# Patient Record
Sex: Female | Born: 1950 | Race: White | Hispanic: No | Smoking: Former smoker
Health system: Southern US, Community
[De-identification: ages and names within clinical notes are randomized; demographics above are authoritative.]

## PROBLEM LIST (undated history)

## (undated) DIAGNOSIS — H269 Unspecified cataract: Secondary | ICD-10-CM

## (undated) DIAGNOSIS — J449 Chronic obstructive pulmonary disease, unspecified: Secondary | ICD-10-CM

## (undated) DIAGNOSIS — E785 Hyperlipidemia, unspecified: Secondary | ICD-10-CM

## (undated) DIAGNOSIS — J45909 Unspecified asthma, uncomplicated: Secondary | ICD-10-CM

## (undated) DIAGNOSIS — Z5189 Encounter for other specified aftercare: Secondary | ICD-10-CM

## (undated) DIAGNOSIS — K219 Gastro-esophageal reflux disease without esophagitis: Secondary | ICD-10-CM

## (undated) DIAGNOSIS — M1990 Unspecified osteoarthritis, unspecified site: Secondary | ICD-10-CM

## (undated) DIAGNOSIS — D649 Anemia, unspecified: Secondary | ICD-10-CM

## (undated) DIAGNOSIS — F419 Anxiety disorder, unspecified: Secondary | ICD-10-CM

## (undated) DIAGNOSIS — I10 Essential (primary) hypertension: Secondary | ICD-10-CM

## (undated) DIAGNOSIS — F32A Depression, unspecified: Secondary | ICD-10-CM

## (undated) HISTORY — DX: Anxiety disorder, unspecified: F41.9

## (undated) HISTORY — DX: Hyperlipidemia, unspecified: E78.5

## (undated) HISTORY — PX: OTHER SURGICAL HISTORY: SHX169

## (undated) HISTORY — PX: KNEE SURGERY: SHX244

## (undated) HISTORY — PX: APPENDECTOMY: SHX54

## (undated) HISTORY — DX: Gastro-esophageal reflux disease without esophagitis: K21.9

## (undated) HISTORY — DX: Unspecified cataract: H26.9

## (undated) HISTORY — DX: Encounter for other specified aftercare: Z51.89

## (undated) HISTORY — DX: Anemia, unspecified: D64.9

## (undated) HISTORY — DX: Chronic obstructive pulmonary disease, unspecified: J44.9

## (undated) HISTORY — DX: Unspecified asthma, uncomplicated: J45.909

## (undated) HISTORY — PX: NASAL SINUS SURGERY: SHX719

## (undated) HISTORY — DX: Unspecified osteoarthritis, unspecified site: M19.90

## (undated) HISTORY — DX: Essential (primary) hypertension: I10

## (undated) HISTORY — DX: Depression, unspecified: F32.A

---

## 2001-10-17 ENCOUNTER — Encounter (INDEPENDENT_AMBULATORY_CARE_PROVIDER_SITE_OTHER): Payer: Self-pay | Admitting: Family Medicine

## 2003-12-12 ENCOUNTER — Emergency Department (HOSPITAL_COMMUNITY): Admission: EM | Admit: 2003-12-12 | Discharge: 2003-12-12 | Payer: Self-pay | Admitting: Family Medicine

## 2004-05-05 ENCOUNTER — Emergency Department (HOSPITAL_COMMUNITY): Admission: EM | Admit: 2004-05-05 | Discharge: 2004-05-05 | Payer: Self-pay | Admitting: Family Medicine

## 2004-05-19 ENCOUNTER — Ambulatory Visit: Payer: Self-pay | Admitting: Internal Medicine

## 2004-05-29 ENCOUNTER — Ambulatory Visit: Payer: Self-pay | Admitting: Family Medicine

## 2004-06-02 ENCOUNTER — Ambulatory Visit: Payer: Self-pay | Admitting: *Deleted

## 2004-06-06 ENCOUNTER — Ambulatory Visit: Payer: Self-pay | Admitting: Family Medicine

## 2004-11-14 ENCOUNTER — Ambulatory Visit: Payer: Self-pay | Admitting: Family Medicine

## 2005-05-07 ENCOUNTER — Ambulatory Visit: Payer: Self-pay | Admitting: Family Medicine

## 2005-05-15 ENCOUNTER — Ambulatory Visit: Payer: Self-pay | Admitting: Family Medicine

## 2005-10-06 ENCOUNTER — Ambulatory Visit: Payer: Self-pay | Admitting: Internal Medicine

## 2005-11-13 ENCOUNTER — Ambulatory Visit: Payer: Self-pay | Admitting: Family Medicine

## 2005-11-19 ENCOUNTER — Ambulatory Visit: Payer: Self-pay | Admitting: Family Medicine

## 2005-12-01 ENCOUNTER — Ambulatory Visit: Payer: Self-pay | Admitting: Family Medicine

## 2005-12-31 ENCOUNTER — Ambulatory Visit: Payer: Self-pay | Admitting: Family Medicine

## 2006-04-06 ENCOUNTER — Ambulatory Visit (HOSPITAL_COMMUNITY): Admission: RE | Admit: 2006-04-06 | Discharge: 2006-04-06 | Payer: Self-pay | Admitting: Family Medicine

## 2006-06-23 ENCOUNTER — Emergency Department (HOSPITAL_COMMUNITY): Admission: EM | Admit: 2006-06-23 | Discharge: 2006-06-23 | Payer: Self-pay | Admitting: Family Medicine

## 2006-07-24 ENCOUNTER — Emergency Department (HOSPITAL_COMMUNITY): Admission: EM | Admit: 2006-07-24 | Discharge: 2006-07-24 | Payer: Self-pay | Admitting: Emergency Medicine

## 2006-07-27 ENCOUNTER — Ambulatory Visit: Payer: Self-pay | Admitting: Family Medicine

## 2006-10-25 ENCOUNTER — Encounter (INDEPENDENT_AMBULATORY_CARE_PROVIDER_SITE_OTHER): Payer: Self-pay | Admitting: Family Medicine

## 2006-10-25 DIAGNOSIS — J45909 Unspecified asthma, uncomplicated: Secondary | ICD-10-CM | POA: Insufficient documentation

## 2006-10-25 DIAGNOSIS — Z9849 Cataract extraction status, unspecified eye: Secondary | ICD-10-CM

## 2006-10-25 DIAGNOSIS — L503 Dermatographic urticaria: Secondary | ICD-10-CM

## 2006-10-25 DIAGNOSIS — Z9889 Other specified postprocedural states: Secondary | ICD-10-CM | POA: Insufficient documentation

## 2006-10-25 DIAGNOSIS — I10 Essential (primary) hypertension: Secondary | ICD-10-CM | POA: Insufficient documentation

## 2006-10-25 DIAGNOSIS — J309 Allergic rhinitis, unspecified: Secondary | ICD-10-CM | POA: Insufficient documentation

## 2006-10-25 DIAGNOSIS — H269 Unspecified cataract: Secondary | ICD-10-CM

## 2006-12-22 ENCOUNTER — Emergency Department (HOSPITAL_COMMUNITY): Admission: EM | Admit: 2006-12-22 | Discharge: 2006-12-22 | Payer: Self-pay | Admitting: Family Medicine

## 2006-12-22 ENCOUNTER — Encounter (INDEPENDENT_AMBULATORY_CARE_PROVIDER_SITE_OTHER): Payer: Self-pay | Admitting: Family Medicine

## 2006-12-23 ENCOUNTER — Ambulatory Visit: Payer: Self-pay | Admitting: Internal Medicine

## 2007-04-05 ENCOUNTER — Telehealth (INDEPENDENT_AMBULATORY_CARE_PROVIDER_SITE_OTHER): Payer: Self-pay | Admitting: Family Medicine

## 2007-10-17 ENCOUNTER — Telehealth (INDEPENDENT_AMBULATORY_CARE_PROVIDER_SITE_OTHER): Payer: Self-pay | Admitting: Family Medicine

## 2007-11-25 ENCOUNTER — Emergency Department (HOSPITAL_COMMUNITY): Admission: EM | Admit: 2007-11-25 | Discharge: 2007-11-25 | Payer: Self-pay | Admitting: Family Medicine

## 2008-03-29 ENCOUNTER — Emergency Department (HOSPITAL_COMMUNITY): Admission: EM | Admit: 2008-03-29 | Discharge: 2008-03-29 | Payer: Self-pay | Admitting: Emergency Medicine

## 2008-04-16 ENCOUNTER — Emergency Department (HOSPITAL_COMMUNITY): Admission: EM | Admit: 2008-04-16 | Discharge: 2008-04-16 | Payer: Self-pay | Admitting: Emergency Medicine

## 2008-04-25 ENCOUNTER — Telehealth (INDEPENDENT_AMBULATORY_CARE_PROVIDER_SITE_OTHER): Payer: Self-pay | Admitting: Family Medicine

## 2008-05-04 ENCOUNTER — Encounter (INDEPENDENT_AMBULATORY_CARE_PROVIDER_SITE_OTHER): Payer: Self-pay | Admitting: Family Medicine

## 2008-10-19 ENCOUNTER — Emergency Department (HOSPITAL_COMMUNITY): Admission: EM | Admit: 2008-10-19 | Discharge: 2008-10-19 | Payer: Self-pay | Admitting: Family Medicine

## 2008-11-30 ENCOUNTER — Ambulatory Visit: Payer: Self-pay | Admitting: Internal Medicine

## 2008-11-30 DIAGNOSIS — R635 Abnormal weight gain: Secondary | ICD-10-CM

## 2008-11-30 DIAGNOSIS — J449 Chronic obstructive pulmonary disease, unspecified: Secondary | ICD-10-CM

## 2008-11-30 DIAGNOSIS — J4489 Other specified chronic obstructive pulmonary disease: Secondary | ICD-10-CM | POA: Insufficient documentation

## 2008-11-30 LAB — CONVERTED CEMR LAB
ALT: 14 units/L (ref 0–35)
AST: 13 units/L (ref 0–37)
Albumin: 4.4 g/dL (ref 3.5–5.2)
Basophils Absolute: 0 10*3/uL (ref 0.0–0.1)
Bilirubin Urine: NEGATIVE
Blood in Urine, dipstick: NEGATIVE
Chloride: 104 meq/L (ref 96–112)
Glucose, Bld: 82 mg/dL (ref 70–99)
Lymphocytes Relative: 27 % (ref 12–46)
Lymphs Abs: 2.2 10*3/uL (ref 0.7–4.0)
MCHC: 33.3 g/dL (ref 30.0–36.0)
Neutro Abs: 4.4 10*3/uL (ref 1.7–7.7)
Nitrite: NEGATIVE
Platelets: 295 10*3/uL (ref 150–400)
Potassium: 3.3 meq/L — ABNORMAL LOW (ref 3.5–5.3)
Protein, U semiquant: NEGATIVE
RDW: 13.6 % (ref 11.5–15.5)
Specific Gravity, Urine: 1.01
Total Bilirubin: 0.5 mg/dL (ref 0.3–1.2)
Total Protein: 7.2 g/dL (ref 6.0–8.3)
pH: 7

## 2008-12-10 ENCOUNTER — Encounter (INDEPENDENT_AMBULATORY_CARE_PROVIDER_SITE_OTHER): Payer: Self-pay | Admitting: Internal Medicine

## 2009-03-08 ENCOUNTER — Ambulatory Visit: Payer: Self-pay | Admitting: Internal Medicine

## 2009-03-08 DIAGNOSIS — M25519 Pain in unspecified shoulder: Secondary | ICD-10-CM | POA: Insufficient documentation

## 2009-05-24 ENCOUNTER — Emergency Department (HOSPITAL_COMMUNITY): Admission: EM | Admit: 2009-05-24 | Discharge: 2009-05-24 | Payer: Self-pay | Admitting: Emergency Medicine

## 2009-10-09 ENCOUNTER — Emergency Department (HOSPITAL_COMMUNITY): Admission: EM | Admit: 2009-10-09 | Discharge: 2009-10-09 | Payer: Self-pay | Admitting: Family Medicine

## 2009-10-09 ENCOUNTER — Emergency Department (HOSPITAL_COMMUNITY): Admission: EM | Admit: 2009-10-09 | Discharge: 2009-10-09 | Payer: Self-pay | Admitting: Emergency Medicine

## 2009-10-22 ENCOUNTER — Emergency Department (HOSPITAL_COMMUNITY): Admission: EM | Admit: 2009-10-22 | Discharge: 2009-10-23 | Payer: Self-pay | Admitting: Emergency Medicine

## 2009-10-23 ENCOUNTER — Ambulatory Visit: Payer: Self-pay | Admitting: Internal Medicine

## 2009-10-24 ENCOUNTER — Telehealth (INDEPENDENT_AMBULATORY_CARE_PROVIDER_SITE_OTHER): Payer: Self-pay | Admitting: *Deleted

## 2009-10-25 ENCOUNTER — Ambulatory Visit: Payer: Self-pay | Admitting: Internal Medicine

## 2009-10-25 ENCOUNTER — Encounter (INDEPENDENT_AMBULATORY_CARE_PROVIDER_SITE_OTHER): Payer: Self-pay | Admitting: Internal Medicine

## 2009-10-25 ENCOUNTER — Encounter: Payer: Self-pay | Admitting: Internal Medicine

## 2009-10-25 DIAGNOSIS — F329 Major depressive disorder, single episode, unspecified: Secondary | ICD-10-CM

## 2009-10-25 DIAGNOSIS — R05 Cough: Secondary | ICD-10-CM

## 2009-10-25 DIAGNOSIS — F410 Panic disorder [episodic paroxysmal anxiety] without agoraphobia: Secondary | ICD-10-CM

## 2009-10-25 LAB — CONVERTED CEMR LAB
Lymphocytes Relative: 36 % (ref 12–46)
MCHC: 31.6 g/dL (ref 30.0–36.0)
MCV: 93.6 fL (ref 78.0–100.0)
Monocytes Relative: 15 % — ABNORMAL HIGH (ref 3–12)
Neutro Abs: 2.3 10*3/uL (ref 1.7–7.7)
Neutrophils Relative %: 40 % — ABNORMAL LOW (ref 43–77)
RBC: 4.23 M/uL (ref 3.87–5.11)
TSH: 1.229 microintl units/mL (ref 0.350–4.500)
WBC: 5.6 10*3/uL (ref 4.0–10.5)

## 2009-11-11 ENCOUNTER — Telehealth (INDEPENDENT_AMBULATORY_CARE_PROVIDER_SITE_OTHER): Payer: Self-pay | Admitting: Internal Medicine

## 2009-11-14 ENCOUNTER — Ambulatory Visit: Payer: Self-pay | Admitting: Internal Medicine

## 2009-11-29 ENCOUNTER — Ambulatory Visit: Payer: Self-pay | Admitting: Internal Medicine

## 2009-12-26 ENCOUNTER — Telehealth (INDEPENDENT_AMBULATORY_CARE_PROVIDER_SITE_OTHER): Payer: Self-pay | Admitting: Nurse Practitioner

## 2010-01-27 ENCOUNTER — Ambulatory Visit: Payer: Self-pay | Admitting: Internal Medicine

## 2010-01-30 ENCOUNTER — Ambulatory Visit: Payer: Self-pay | Admitting: Internal Medicine

## 2010-03-06 ENCOUNTER — Emergency Department (HOSPITAL_COMMUNITY)
Admission: EM | Admit: 2010-03-06 | Discharge: 2010-03-06 | Payer: Self-pay | Source: Home / Self Care | Admitting: Family Medicine

## 2010-03-09 ENCOUNTER — Encounter: Payer: Self-pay | Admitting: Family Medicine

## 2010-03-09 ENCOUNTER — Encounter: Payer: Self-pay | Admitting: Internal Medicine

## 2010-03-18 NOTE — Progress Notes (Signed)
Summary: MEDS REFILL Zoloft  Phone Note Refill Request Call back at [phone]   Refills Requested: Medication #1:  ZOLOFT 50 MG TABS One tablet by mouth daily for mood [facility] ...  Initial call taken by: Domenic Polite,  December 26, 2009 4:27 PM  Follow-up for Phone Call        Refills transferred to [facility].  Dutch Quint RN  December 26, 2009 5:04 PM      Appended Document: MEDS REFILL Zoloft Refill called into [place] -- no record of it at [facility].  Dutch Quint RN  December 30, 2009 2:36 PM

## 2010-03-18 NOTE — Assessment & Plan Note (Signed)
Summary: OV:  breathing problems--triage from nurse  Nurse Visit   Vital Signs:  Patient profile:   60 year old female Menstrual status:  postmenopausal O2 Sat:      97 % on Room air Temp:     97.5 degrees F oral Pulse rate:   96 / minute Pulse rhythm:   regular Resp:     20 per minute BP sitting:   128 / 72  (right arm) Cuff size:   regular  Vitals Entered By: Dutch Quint RN (October 25, 2009 9:50 AM)  O2 Flow:  Room air  Patient Instructions: 1)  Follow up with Dr. Delrae Alfred in 1 month --depression 2)  Referral to [facility]   Physical Exam  General:  alert, well-developed, well-nourished, and well-hydrated.  Episode of cough while in room--dry and hacky paroxyms to point of gagging. Eyes:  No corneal or conjunctival inflammation noted. EOMI. Perrla. Funduscopic exam benign, without hemorrhages, exudates or papilledema. Vision grossly normal. Ears:  External ear exam shows no significant lesions or deformities.  Otoscopic examination reveals clear canals, tympanic membranes are intact bilaterally without bulging, retraction, inflammation or discharge. Hearing is grossly normal bilaterally. Nose:  External nasal examination shows no deformity or inflammation. Nasal mucosa are pink and moist without lesions or exudates. Mouth:  pharynx pink and moist.   Lungs:  normal respiratory effort, no intercostal retractions, no accessory muscle use, no crackles, and no wheezes.   Heart:  normal rate and regular rhythm.   Psych:  anxious appearing   Impression & Recommendations:  Problem # 1:  COPD (ICD-496) Asthma/cough To continue meds--finish prednisone -- not sure how much of this is due to anxiety as well. Keep side effect of Benazapril in mind as well. Her updated medication list for this problem includes:    Advair Diskus 100-50 Mcg/dose Misc (Fluticasone-salmeterol) ..... One puff two times a day    Singulair 10 Mg Tabs (Montelukast sodium) ..... One every afternoon   Proventil Hfa 108 (90 Base) Mcg/act Aers (Albuterol sulfate) .Marland Kitchen... 1-2 puffs q 4-6 hours as needed    Spiriva Handihaler 18 Mcg Caps (Tiotropium bromide monohydrate) .Marland Kitchen... 1 inhalation daily  Problem # 2:  COLD INTOLERANCE (ICD-780.99) No definite findings or history of Raynaud's Orders: T-CBC w/Diff (04540-98119) T-TSH (14782-95621)  Problem # 3:  DEPRESSION (ICD-311) Start Zoloft Referral for counseling with [facility] Solian Her updated medication list for this problem includes:    Zoloft 50 Mg Tabs (Sertraline hcl) .Marland Kitchen... 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 today. Her updated medication list for this problem includes:    Zoloft 50 Mg Tabs (Sertraline hcl) .Marland Kitchen... 1/2 tab by mouth daily for 7 days, then increase to 1 tab daily     Orders: Psychology Referral (Psychology)  Problem # 5:  SHOULDER PAIN, RIGHT (ICD-719.41) Exercises discussed following warm packing. Her updated medication list for this problem includes:    Cyclobenzaprine Hcl 10 Mg Tabs (Cyclobenzaprine hcl) .Marland Kitchen... 1/2 to 1 tab by mouth every evening as needed shoulder pain  Complete Medication List: 1)  Nasacort Aq 55 Mcg/act Aers (Triamcinolone acetonide(nasal)) .... 2 squirts eah nostril once daily 2)  Advair Diskus 100-50 Mcg/dose Misc (Fluticasone-salmeterol) .... One puff two times a day 3)  Singulair 10 Mg Tabs (Montelukast sodium) .... One every afternoon 4)  Proventil Hfa  108 (90 Base) Mcg/act Aers (Albuterol sulfate) .Marland Kitchen.. 1-2 puffs q 4-6 hours as needed 5)  Hydrochlorothiazide 12.5 Mg Caps (Hydrochlorothiazide) .... Take one capsule each morning 6)  Benazepril Hcl 10 Mg Tabs (Benazepril hcl) .... Take one tablet by mouth qam 7)  Loratadine 10 Mg Tabs (Loratadine) .Marland Kitchen.. 1 tab by mouth daily 8)  Aerochamber Mv Misc (Spacer/aero-holding chambers) 9)  Spiriva Handihaler 18 Mcg Caps  (Tiotropium bromide monohydrate) .Marland Kitchen.. 1 inhalation daily 10)  Cyclobenzaprine Hcl 10 Mg Tabs (Cyclobenzaprine hcl) .... 1/2 to 1 tab by mouth every evening as needed shoulder pain 11)  Zoloft 50 Mg Tabs (Sertraline hcl) .... 1/2 tab by mouth daily for 7 days, then increase to 1 tab daily 12)  Lorazepam 1 Mg Tabs (Lorazepam) .... 1/2  to 1 tab o as needed anxiety   History of Present Illness: States breathing problem isn't in her lower lungs, she feels like she's got something lodged in her upper airway and she can't cough it out.  Sometimes, she coughs so hard that she almost vomits, will cough up thick, clear-yellow mucus.  Feels like she "has inflammation".  Takes all her medications as ordered, using her inhaler frequently.  Provider HPI:    1.  For past 2 weeks, has felt blocked in low throat to upper chest, feels blocked fairly consistently.  Coughs in paroxysms until brings up a plug of hard mucous--clear to white.  Does not really have chest tightness--feels like it is in her upper airway about tracheal level.  Cough gets better when brings up plug.  Was seen in ED 10/23/09--late evening as could just not open up airway, even with rescue inhaler.  Unable to find measurements, but reportedly peak flow improved with just one neb treatment and pt. felt better. Chest Xray showed peribronchial thickening constistent with bronchitis.  Was given Prednisone burst for 5 days, which she continues.  Pt. continuing to have cough and sense of block in upper airway, though is a bit better.   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  Takes Loratadine daily for this.  Not sure if it makes a difference.  No rhyme or reason to the rash.    Worse time of year for breathing is in winter.  Generally, does not have trouble in the fall with ragweed, etc.    2.  3 days ago:  sudden onset of cold.  Was so cold that fingers went numb.  Went out to car and turned heat all the way up--made no difference.  Went home from work and  covered up and eventually improved.  Did not get light headed with this.  Lasted 3 hours.  Has a history of hands getting very cold when exposed to the cold--has to hold hands under warm water  3.  Depression/anxiety:  pt. went to screening at [facility] recently and was told she has depression.  Has felt depressed and has had panic attacks all of adult life.  Always thought it would be brushed off, so has never brought up to a caregiver.  Depression(cont)  Does not look forward to day.  No motivation.  No pleasure in anything.  Does not want to go out anywhere.  Tearful often.  No suicidal ideation.  Very anxious and perserverates on physical concerns or any concern.  Had panic attack in past week that lasted 3 hours.  4.  Muscle spasm--cervical--would like a bit more of muscle relaxant to use as needed.  Did not go to PT as better with  exercises I gave her.     Review of Systems General:  Complains of chills, fatigue, and malaise; denies fever, loss of appetite, and weakness; Coughs at night which disturbs sleep.  States that the other day, she got so cold that her hands were numb, which gave her some concern.Marland Kitchen Resp:  Complains of cough, shortness of breath, and sputum productive; denies chest discomfort, chest pain with inspiration, coughing up blood, hypersomnolence, morning headaches, pleuritic, and wheezing; Feels like she has a "tightness" in her anterior chest.  Has to use inhaler frequently..   Allergies: 1)  ! Penicillin 2)  ! Sulfa 3)  ! Ibuprofen  Orders Added: 1)  Est. Patient Level IV [16109] 2)  T-CBC w/Diff [60454-09811] 3)  T-TSH [91478-29562] 4)  Psychology Referral [Psychology] Prescriptions: CYCLOBENZAPRINE HCL 10 MG TABS (CYCLOBENZAPRINE HCL) 1/2 to 1 tab by mouth every evening as needed shoulder pain  #30 x 0   Entered and Authorized by:   Julieanne Manson MD   Signed by:   Julieanne Manson MD on 10/25/2009   Method used:   Print then Give to Patient   RxID:    [id number] SPIRIVA HANDIHALER 18 MCG CAPS (TIOTROPIUM BROMIDE MONOHYDRATE) 1 inhalation daily  #1 x 11   Entered and Authorized by:   Julieanne Manson MD   Signed by:   Julieanne Manson MD on 10/25/2009   Method used:   Faxed to ...       [facility] (retail)       [address] [place], [place]  [address]       Ph: [phone]       Fax: [phone]   RxID:   [id number] LORATADINE 10 MG TABS (LORATADINE) 1 tab by mouth daily  #30 x 11   Entered and Authorized by:   Julieanne Manson MD   Signed by:   Julieanne Manson MD on 10/25/2009   Method used:   Faxed to ...       [facility] - [facility] (retail)       [address] [place], [place]  [address]       Ph: [phone]       Fax: [phone]   RxID:   [id number] BENAZEPRIL HCL 10 MG TABS (BENAZEPRIL HCL) Take one tablet by mouth Qam  #30 Each x 11   Entered and Authorized by:   Julieanne Manson MD   Signed by:   Julieanne Manson MD on 10/25/2009   Method used:   Faxed to ...       [facility] - [facility] (retail)       [address] [place], [place]  [address]       Ph: [phone]       Fax: [phone]   RxID:   [id number] HYDROCHLOROTHIAZIDE 12.5 MG  CAPS (HYDROCHLOROTHIAZIDE) Take one capsule each morning  #30 Each x 11   Entered and Authorized by:   Julieanne Manson MD   Signed by:   Julieanne Manson MD on 10/25/2009   Method used:   Faxed to ...       [facility] - [facility] (retail)       [address] [place], [place]  [address]       Ph: [phone]       Fax: [phone]  RxID:   [id number] PROVENTIL HFA 108 (90 BASE) MCG/ACT AERS (ALBUTEROL SULFATE) 1-2 puffs q 4-6 hours as needed  #1 x 5   Entered and Authorized by:   Julieanne Manson MD   Signed by:   Julieanne Manson MD on 10/25/2009   Method used:   Faxed to  ...       [facility] - [facility] (retail)       [address] [place], [place]  [address]       Ph: [phone]       Fax: [phone]   RxID:   [id number] SINGULAIR 10 MG  TABS (MONTELUKAST SODIUM) one every afternoon  #30 x 11   Entered and Authorized by:   Julieanne Manson MD   Signed by:   Julieanne Manson MD on 10/25/2009   Method used:   Faxed to ...       [facility] - [facility] (retail)       [address] [place], [place]  [address]       Ph: [phone]       Fax: [phone]   RxID:   [id number] ADVAIR DISKUS 100-50 MCG/DOSE  MISC (FLUTICASONE-SALMETEROL) one puff two times a day  #1 x 11   Entered and Authorized by:   Julieanne Manson MD   Signed by:   Julieanne Manson MD on 10/25/2009   Method used:   Faxed to ...       [facility] - [facility] (retail)       [address] [place], [place]  [address]       Ph: [phone]       Fax: [phone]   RxID:   [id number] NASACORT AQ 55 MCG/ACT  AERS (TRIAMCINOLONE ACETONIDE(NASAL)) 2 squirts eah nostril once daily  #1 x 11   Entered and Authorized by:   Julieanne Manson MD   Signed by:   Julieanne Manson MD on 10/25/2009   Method used:   Faxed to ...       [facility] - [facility] (retail)       [address] [place], [place]  [address]       Ph: [phone]       Fax: [phone]   RxID:   [id number] ZOLOFT 50 MG TABS (SERTRALINE HCL) 1/2 tab by mouth daily for 7 days, then increase to 1 tab daily  #30 x 1   Entered and Authorized by:   Julieanne Manson MD   Signed by:   Julieanne Manson MD on 10/25/2009   Method used:   Faxed to ...       [facility] - [facility] (retail)       [address] [place], [place]  [address]       Ph: [phone]       Fax: [phone]   RxID:   [id number]    Vital  Signs:  Patient Profile:   60 year old female Height:     66 inches O2 Sat:      97 % Temp:     97.5 degrees F oral Pulse rate:   96 / minute Pulse rhythm:   regular Resp:     20 per minute BP sitting:   128 / 72 Cuff size:   regular

## 2010-03-18 NOTE — Progress Notes (Signed)
Summary: Dissatisfied with Service 10/23/09  Phone Note Call from Patient   Summary of Call: Spoke to Ms Lada this morning who was upset that her appt was at 11:45am and she was still waiting to be seen by provider at 1:55pm. Did advise pt that is not our normal procedure but will note her chart to credit her account until her next visit which was scheduled 9/20. I sent out info to staff regarding patients waiting in the room to continue to update them on providers progress when they have been waiting over 30 minutes in the room. Pt just wanted to make our office aware and how she was dissatisfied with her visit. I have noted information and we will make improvements in the future.  Initial call taken by: Hassell Halim CMA,  October 24, 2009 10:48 AM

## 2010-03-18 NOTE — Progress Notes (Signed)
Summary: Possible med reaction  Phone Note Call from Patient Call back at [phone]   Caller: Patient Reason for Call: Acute Illness, Talk to Nurse Summary of Call: posible meds reaction need to speak with the nurse Initial call taken by: Oscar La,  November 11, 2009 9:21 AM  Follow-up for Phone Call        Started taking Zoloft 9/19; on Saturday night started having feeling of "going to sleep" feeling in hands and feet, also slightly nauseated in the morning.  These are her only symptoms.  Has been on other meds long-term.  Denies rash, hives, SOB, headache, vomiting.  Worried about possible compatibility with other meds. Follow-up by: Dutch Quint RN,  November 11, 2009 9:53 AM  Additional Follow-up for Phone Call Additional follow up Details #1::        There shouldn't be any problems with her other meds.  Would recommend trying to continue the Zoloft, but if her symptoms worsen, to call.  Many odd side effects dissipate with time.  If she has symptoms of SOB, hives, swelling, to stop and call or be seen immediately. Additional Follow-up by: Julieanne Manson MD,  November 13, 2009 9:30 AM    Additional Follow-up for Phone Call Additional follow up Details #2::    Advised of provider's response and recommendations.  Will also call back if symptoms continue or worsen. Dutch Quint RN  November 13, 2009 11:33 AM

## 2010-03-18 NOTE — Assessment & Plan Note (Signed)
Summary: CHK ASTHMA////KT   Vital Signs:  Patient profile:   60 year old female Menstrual status:  postmenopausal Height:      66 inches Weight:      174.1 pounds O2 Sat:      96 % on Room air Temp:     98.0 degrees F oral Pulse rate:   118 / minute Pulse rhythm:   regular Resp:     16 per minute BP sitting:   108 / 64  (left arm) Cuff size:   regular  Vitals Entered By: Michelle Nasuti (October 23, 2009 12:29 PM)  O2 Flow:  Room air    History of Present Illness: Left before could be seen  Allergies: 1)  ! Penicillin 2)  ! Sulfa 3)  ! Ibuprofen   Complete Medication List: 1)  Nasacort Aq 55 Mcg/act Aers (Triamcinolone acetonide(nasal)) .... 2 squirts eah nostril once daily 2)  Advair Diskus 100-50 Mcg/dose Misc (Fluticasone-salmeterol) .... One puff two times a day 3)  Singulair 10 Mg Tabs (Montelukast sodium) .... One every afternoon 4)  Proventil Hfa 108 (90 Base) Mcg/act Aers (Albuterol sulfate) .Marland Kitchen.. 1-2 puffs q 4-6 hours as needed 5)  Hydrochlorothiazide 12.5 Mg Caps (Hydrochlorothiazide) .... Take one capsule each morning 6)  Benazepril Hcl 10 Mg Tabs (Benazepril hcl) .... Take one tablet by mouth qam 7)  Zyrtec Allergy 10 Mg Tabs (Cetirizine hcl) .... Take 1 tablet by mouth qday as needed allergy/itching 8)  Aerochamber Mv Misc (Spacer/aero-holding chambers) 9)  Spiriva Handihaler 18 Mcg Caps (Tiotropium bromide monohydrate) .Marland Kitchen.. 1 inhalation daily 10)  Cyclobenzaprine Hcl 10 Mg Tabs (Cyclobenzaprine hcl) .... 1/2 to 1 tab by mouth every evening as needed shoulder pain  Laboratory Results       Vital Signs:  Patient Profile:   60 year old female Height:     66 inches Weight:      174.1 pounds O2 Sat:      96 % Temp:     98.0 degrees F oral Pulse rate:   118 / minute Pulse rhythm:   regular Resp:     16 per minute BP sitting:   108 / 64 Cuff size:   regular  Vitals Entered By: Michelle Nasuti (October 23, 2009 12:31 PM)

## 2010-03-18 NOTE — Letter (Signed)
Summary: PSYCHOLOGY//SUMMARY  PSYCHOLOGY//SUMMARY   Imported By: Arta Bruce 01/15/2010 16:11:38  _____________________________________________________________________  External Attachment:    Type:   Image     Comment:   External Document

## 2010-03-18 NOTE — Assessment & Plan Note (Signed)
Summary: 1 MONTH FU ON DEPRESSION//KT   Vital Signs:  Patient profile:   60 year old female Menstrual status:  postmenopausal Height:      66 inches Weight:      172.4 pounds Temp:     97.6 degrees F oral Pulse rate:   78 / minute Pulse rhythm:   regular Resp:     18 per minute BP sitting:   120 / 86  (left arm) Cuff size:   regular  Vitals Entered By: Michelle Nasuti (November 29, 2009 12:38 PM) CC: FOLLOW UP VISIT ON DEPRESSION. RECENTLY STARED ZOLOFT.Marland KitchenMarland KitchenANXIETY IS INCREASING....PT CAN TELL MEDS ARE HELPING SLIGHTLY Is Patient Diabetic? No Pain Assessment Patient in pain? no       Does patient need assistance? Functional Status Self care Ambulation Normal   CC:  FOLLOW UP VISIT ON DEPRESSION. RECENTLY STARED ZOLOFT.Marland KitchenMarland KitchenANXIETY IS INCREASING....PT CAN TELL MEDS ARE HELPING SLIGHTLY.  History of Present Illness: 1.  Panic Attacks/depression:  having prolonged anxiety attacks --more so than previously and lasting now a couple of hours.  Has met with [facility] Solian once--mainly intake.  Needs to make a follow up appt.  With regards to depression--has calmed her.  Not as explosive with anger as previously.  Fatigue and motivation has improved.  Does not fear going out as she might have a panic attack.  Still not very social.    2.  Neck Pain/muscle spasm:  doing much better.  Doing exercises we discussed and Cyclobenzaprine at night--using the latter once weekly.    3.  Breathing issues and sense of something in her throat is resolved.  4.  Cold sensation in hands and feet as well as mouth.  Has watched for color change and did not note.  Has noted this sensation around mouth since last seen.  Actually more of a sense that fingers, toes and around mouth are numb and tingling.  Generally occurs when anxious--has noted a lessening of this since last seen.    Current Medications (verified): 1)  Nasacort Aq 55 Mcg/act  Aers (Triamcinolone Acetonide(Nasal)) .... 2 Squirts Eah Nostril Once  Daily 2)  Advair Diskus 100-50 Mcg/dose  Misc (Fluticasone-Salmeterol) .... One Puff Two Times A Day 3)  Singulair 10 Mg  Tabs (Montelukast Sodium) .... One Every Afternoon 4)  Proventil Hfa 108 (90 Base) Mcg/act Aers (Albuterol Sulfate) .Marland Kitchen.. 1-2 Puffs Q 4-6 Hours As Needed 5)  Hydrochlorothiazide 12.5 Mg  Caps (Hydrochlorothiazide) .... Take One Capsule Each Morning 6)  Benazepril Hcl 10 Mg Tabs (Benazepril Hcl) .... Take One Tablet By Mouth Qam 7)  Loratadine 10 Mg Tabs (Loratadine) .Marland Kitchen.. 1 Tab By Mouth Daily 8)  Aerochamber Mv   Misc (Spacer/aero-Holding Chambers) 9)  Spiriva Handihaler 18 Mcg Caps (Tiotropium Bromide Monohydrate) .Marland Kitchen.. 1 Inhalation Daily 10)  Cyclobenzaprine Hcl 10 Mg Tabs (Cyclobenzaprine Hcl) .... 1/2 To 1 Tab By Mouth Every Evening As Needed Shoulder Pain 11)  Zoloft 50 Mg Tabs (Sertraline Hcl) .... 1/2 Tab By Mouth Daily For 7 Days, Then Increase To 1 Tab Daily  Allergies (verified): 1)  ! Penicillin 2)  ! Sulfa 3)  ! Ibuprofen  Physical Exam  General:  NAD--much less anxious appearing. Lungs:  Normal respiratory effort, chest expands symmetrically. Lungs are clear to auscultation, no crackles or wheezes. Heart:  Normal rate and regular rhythm. S1 and S2 normal without gallop, murmur, click, rub or other extra sounds.  Radial pulses normal and equal   Impression & Recommendations:  Problem # 1:  Preventive Health Care (ICD-V70.0) Flu vaccine  Problem # 2:  PANIC DISORDER (ICD-300.01) Will give small amt of Lorazepam as one time Rx for acute use with panic disorder. Feel she is hyperventilating with hand and perioral numbness at this point. Her updated medication list for this problem includes:    Zoloft 50 Mg Tabs (Sertraline hcl) .Marland Kitchen... 1/2 tab by mouth daily for 7 days, then increase to 1 tab daily    Lorazepam 1 Mg Tabs (Lorazepam) .Marland Kitchen... 1/2  to 1 tab o as needed anxiety  Problem # 3:  COLD INTOLERANCE (ICD-780.99) See #2--more likely numbness sensation  with hyperventilation(hands)  Problem # 4:  ASTHMA (ICD-493.90) previous dyspnea probably related to panic disorder. Her updated medication list for this problem includes:    Advair Diskus 100-50 Mcg/dose Misc (Fluticasone-salmeterol) ..... One puff two times a day    Singulair 10 Mg Tabs (Montelukast sodium) ..... One every afternoon    Proventil Hfa 108 (90 Base) Mcg/act Aers (Albuterol sulfate) .Marland Kitchen... 1-2 puffs q 4-6 hours as needed    Spiriva Handihaler 18 Mcg Caps (Tiotropium bromide monohydrate) .Marland Kitchen... 1 inhalation daily  Problem # 5:  SHOULDER PAIN, RIGHT (ICD-719.41) and neck pain--improved Her updated medication list for this problem includes:    Cyclobenzaprine Hcl 10 Mg Tabs (Cyclobenzaprine hcl) .Marland Kitchen... 1/2 to 1 tab by mouth every evening as needed shoulder pain  Complete Medication List: 1)  Nasacort Aq 55 Mcg/act Aers (Triamcinolone acetonide(nasal)) .... 2 squirts eah nostril once daily 2)  Advair Diskus 100-50 Mcg/dose Misc (Fluticasone-salmeterol) .... One puff two times a day 3)  Singulair 10 Mg Tabs (Montelukast sodium) .... One every afternoon 4)  Proventil Hfa 108 (90 Base) Mcg/act Aers (Albuterol sulfate) .Marland Kitchen.. 1-2 puffs q 4-6 hours as needed 5)  Hydrochlorothiazide 12.5 Mg Caps (Hydrochlorothiazide) .... Take one capsule each morning 6)  Benazepril Hcl 10 Mg Tabs (Benazepril hcl) .... Take one tablet by mouth qam 7)  Loratadine 10 Mg Tabs (Loratadine) .Marland Kitchen.. 1 tab by mouth daily 8)  Aerochamber Mv Misc (Spacer/aero-holding chambers) 9)  Spiriva Handihaler 18 Mcg Caps (Tiotropium bromide monohydrate) .Marland Kitchen.. 1 inhalation daily 10)  Cyclobenzaprine Hcl 10 Mg Tabs (Cyclobenzaprine hcl) .... 1/2 to 1 tab by mouth every evening as needed shoulder pain 11)  Zoloft 50 Mg Tabs (Sertraline hcl) .... 1/2 tab by mouth daily for 7 days, then increase to 1 tab daily 12)  Lorazepam 1 Mg Tabs (Lorazepam) .... 1/2  to 1 tab o as needed anxiety  Other Orders: Influenza Vaccine NON MCR  (91478)  Patient Instructions: 1)  Follow up with Dr. Delrae Alfred in 2 months  2)  Please reschedule with Aquilla Solian for counseling.  Needs calming techniques Prescriptions: LORAZEPAM 1 MG TABS (LORAZEPAM) 1/2  to 1 tab o as needed anxiety  #10 x 0   Entered and Authorized by:   Julieanne Manson MD   Signed by:   Julieanne Manson MD on 11/29/2009   Method used:   Print then Give to Patient   RxID:   [id number]    Influenza Vaccine    Vaccine Type: Fluvax Non-MCR    Site: left deltoid    Mfr: GlaxoSmithK[employer]    Dose: 0.5 ml    Route: IM    Given by: Michelle Nasuti    Exp. Date: 08/16/2010    Lot #: [id number]    VIS given: 09/10/09 version given December 02, 2009.  Flu Vaccine Consent Questions    Do you have a history of  severe allergic reactions to this vaccine? no    Any prior history of allergic reactions to egg and/or gelatin? no    Do you have a sensitivity to the preservative Thimersol? no    Do you have a past history of Guillan-Barre Syndrome? no    Do you currently have an acute febrile illness? no    Have you ever had a severe reaction to latex? no    Vaccine information given and explained to patient? yes    Are you currently pregnant? no

## 2010-03-18 NOTE — Assessment & Plan Note (Signed)
Summary: SHOULDER MUSCLE AND RIGHT ARM PAIN//GK   Vital Signs:  Patient profile:   60 year old female Menstrual status:  postmenopausal Weight:      177.2 pounds Pulse rate:   76 / minute Pulse rhythm:   regular Resp:     2097.5 per minute BP sitting:   148 / 82  (left arm) Cuff size:   regular  Vitals Entered By: Levon Hedger (March 08, 2009 8:34 AM) CC: joint and shoulder pain x 3-4 weeks pt has tried Tylenol and warm compresses Is Patient Diabetic? No Pain Assessment Patient in pain? no       Does patient need assistance? Functional Status Self care Ambulation Normal     Menstrual Status postmenopausal Last PAP Result Normal   CC:  joint and shoulder pain x 3-4 weeks pt has tried Tylenol and warm compresses.  History of Present Illness: 1.  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  Now with pain farther out on lateral shoulder and upper arm --burning at pain.  Discomfort near deltoid incision and maybe onto triceps area where pt. points.  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  No numbness or tingling, weakness in arm or shoulder.  10 years ago went through PT and time period of NSAIDs and muscle relaxation.  Taking 2 extra strength Tylenol two times a day without much help.  Angioedema with NSAIDS.  Took Vioxx in past, but before ever developed angioedema with ibuprofen.  No hx of overdoing it or acute injury.  2.  COPD:  Spiriva helped with secretions  3.  Hypertension:  states taking meds.  Medications Prior to Update: 1)  Nasacort Aq 55 Mcg/act  Aers (Triamcinolone Acetonide(Nasal)) .... 2 Squirts Eah Nostril Once Daily 2)  Advair Diskus 100-50 Mcg/dose  Misc (Fluticasone-Salmeterol) .... One Puff Two Times A Day 3)  Singulair 10 Mg  Tabs (Montelukast Sodium) .... One Every Afternoon 4)  Proventil Hfa 108 (90 Base) Mcg/act Aers (Albuterol  Sulfate) .Marland Kitchen.. 1-2 Puffs Q 4-6 Hours As Needed 5)  Hydrochlorothiazide 12.5 Mg  Caps (Hydrochlorothiazide) .... Take One Capsule Each Morning 6)  Benazepril Hcl 10 Mg Tabs (Benazepril Hcl) .... Take One Tablet By Mouth Qam 7)  Zyrtec Allergy 10 Mg  Tabs (Cetirizine Hcl) .... Take 1 Tablet By Mouth Qday As Needed Allergy/itching 8)  Aerochamber Mv   Misc (Spacer/aero-Holding Chambers) 9)  Spiriva Handihaler 18 Mcg Caps (Tiotropium Bromide Monohydrate) .Marland Kitchen.. 1 Inhalation Daily  Allergies (verified): 1)  ! Penicillin 2)  ! Sulfa 3)  ! Ibuprofen  Physical Exam  General:  NAD Msk:  Full ROM of right shoulder.  NT over cervical spinous processes.  Tender over both trap heads and rhomboid area. NT over subacromial bursa.  Mild tenderness over CC and AC joints.  Tender at insertion of deltoid on humerus. Neurologic:  strength normal in all extremities and DTRs symmetrical and normal.     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 One puff two times a day    Singulair 10 Mg Tabs (Montelukast sodium) ..... One every afternoon    Proventil  Hfa 108 (90 Base) Mcg/act Aers (Albuterol sulfate) .Marland Kitchen... 1-2 puffs q 4-6 hours as needed    Spiriva Handihaler 18 Mcg Caps (Tiotropium bromide monohydrate) .Marland Kitchen... 1 inhalation daily  Problem # 3:  HYPERTENSION (ICD-401.9) Fair control Her updated medication list for this problem includes:    Hydrochlorothiazide 12.5 Mg Caps (Hydrochlorothiazide) .Marland Kitchen... Take one capsule each morning    Benazepril Hcl 10 Mg Tabs (Benazepril hcl) .Marland Kitchen... Take one tablet by mouth qam  Complete Medication List: 1)  Nasacort Aq 55 Mcg/act Aers (Triamcinolone acetonide(nasal)) .... 2 squirts eah  nostril once daily 2)  Advair Diskus 100-50 Mcg/dose Misc (Fluticasone-salmeterol) .... One puff two times a day 3)  Singulair 10 Mg Tabs (Montelukast sodium) .... One every afternoon 4)  Proventil Hfa 108 (90 Base) Mcg/act Aers (Albuterol sulfate) .Marland Kitchen.. 1-2 puffs q 4-6 hours as needed 5)  Hydrochlorothiazide 12.5 Mg Caps (Hydrochlorothiazide) .... Take one capsule each morning 6)  Benazepril Hcl 10 Mg Tabs (Benazepril hcl) .... Take one tablet by mouth qam 7)  Zyrtec Allergy 10 Mg Tabs (Cetirizine hcl) .... Take 1 tablet by mouth qday as needed allergy/itching 8)  Aerochamber Mv Misc (Spacer/aero-holding chambers) 9)  Spiriva Handihaler 18 Mcg Caps (Tiotropium bromide monohydrate) .Marland Kitchen.. 1 inhalation daily  Patient Instructions: 1)  Tylenol 1000 mg by mouth two times a day  2)  Follow up with Dr. Delrae Alfred in 3 months  3)  Call if you do not hear from PT in 2 weeks 4)  Work on abdominal strength and posture  Appended Document: SHOULDER MUSCLE AND RIGHT ARM PAIN//GK    Clinical Lists Changes  Medications: Added new medication of CYCLOBENZAPRINE HCL 10 MG TABS (CYCLOBENZAPRINE HCL) 1/2 to 1 tab by mouth every evening as needed shoulder pain - Signed Rx of CYCLOBENZAPRINE HCL 10 MG TABS (CYCLOBENZAPRINE HCL) 1/2 to 1 tab by mouth every evening as needed shoulder pain;  #30 x 0;  Signed;  Entered by: Julieanne Manson MD;  Authorized by: Julieanne Manson MD;  Method used: Print then Give to Patient    Prescriptions: CYCLOBENZAPRINE HCL 10 MG TABS (CYCLOBENZAPRINE HCL) 1/2 to 1 tab by mouth every evening as needed shoulder pain  #30 x 0   Entered and Authorized by:   Julieanne Manson MD   Signed by:   Julieanne Manson MD on 03/08/2009   Method used:   Print then Give to Patient   RxID:   [id number]

## 2010-04-10 ENCOUNTER — Encounter: Payer: Self-pay | Admitting: Internal Medicine

## 2010-04-10 ENCOUNTER — Encounter (INDEPENDENT_AMBULATORY_CARE_PROVIDER_SITE_OTHER): Payer: Self-pay | Admitting: Internal Medicine

## 2010-04-10 DIAGNOSIS — M7710 Lateral epicondylitis, unspecified elbow: Secondary | ICD-10-CM | POA: Insufficient documentation

## 2010-04-15 NOTE — Assessment & Plan Note (Signed)
Vital Signs:  Patient profile:   60 year old female Menstrual status:  postmenopausal Height:      66 inches Weight:      174.3 pounds BMI:     28.23 Temp:     974 degrees F oral Pulse rate:   72 / minute Pulse rhythm:   regular Resp:     18 per minute BP sitting:   110 / 82  (left arm) Cuff size:   regular  Vitals Entered By: [place] Shannon (April 10, 2010 8:37 AM) CC: follow-up visit.... pt says she is having a lot of pain in her elbow, hip and muscle pt went to urgent care... Is Patient Diabetic? No Pain Assessment Patient in pain? no       Does patient need assistance? Functional Status Self care Ambulation Normal   CC:  follow-up visit.... pt says she is having a lot of pain in her elbow and hip and muscle pt went to urgent care....  History of Present Illness: 1.  Left elbow pain:  Started end of December, 1st of January.  Pain started as if hit the lateral olecranon area.   A couple of weeks into the pain, noted severe pain when went to catch something she dropped--describes turning her arm in supination.  Pain actually shot up the posterior aspect of her upper arm from the posterolateral elbow.  Sometimes gets a burning pain into ulnar fingers--4th and 5th.  Hx of fractured elbow 10 years ago.  Never had this pain before.  Does not recall any heavy lifting or other injury just preceding onset of pain.   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  Does not lean on her elbows with work.  Does have elbow on armrest of carseat when driving--bucket seat. Was given corticosteroids at Urgent Care evaluation 03/06/10.  Helped, but once off, pain returned.  Cannot take NSAIDS--angioedema.  2.  Panic Attacks/Depression:  Did see [facility] Solian a few times--worked on finding a calm place when anxious.  Is taking Zoloft.  Still every 2 weeks feel overwhelming sadness.  Lasts a couple of hours.  Sometimes with crying jags.  Tries not to cry as she feels like she won't stop if  she starts.  Still having anxiety episodes maybe twice weekly.  Can last 2-3 hours unless takes 1/4 - 1/2 tab of 1 mg Lorazepam.    3.  Muscle relaxant:  for neck--too groggy with 5 mg.  Does help with symptoms.    Current Medications (verified): 1)  Nasacort Aq 55 Mcg/act  Aers (Triamcinolone Acetonide(Nasal)) .... 2 Squirts Eah Nostril Once Daily 2)  Advair Diskus 100-50 Mcg/dose  Misc (Fluticasone-Salmeterol) .... One Puff Two Times A Day 3)  Singulair 10 Mg  Tabs (Montelukast Sodium) .... One Every Afternoon 4)  Proventil Hfa 108 (90 Base) Mcg/act Aers (Albuterol Sulfate) .Marland Kitchen.. 1-2 Puffs Q 4-6 Hours As Needed 5)  Hydrochlorothiazide 12.5 Mg  Caps (Hydrochlorothiazide) .... Take One Capsule Each Morning 6)  Benazepril Hcl 10 Mg Tabs (Benazepril Hcl) .... Take One Tablet By Mouth Qam 7)  Loratadine 10 Mg Tabs (Loratadine) .Marland Kitchen.. 1 Tab By Mouth Daily 8)  Aerochamber Mv   Misc (Spacer/aero-Holding Chambers) 9)  Spiriva Handihaler 18 Mcg Caps (Tiotropium Bromide Monohydrate) .Marland Kitchen.. 1 Inhalation Daily 10)  Cyclobenzaprine Hcl 10 Mg Tabs (Cyclobenzaprine Hcl) .... 1/2 To 1 Tab By Mouth Every Evening As Needed Shoulder Pain 11)  Zoloft 50 Mg Tabs (Sertraline Hcl) .... One Tablet By Mouth Daily  For Mood 12)  Lorazepam 1 Mg Tabs (Lorazepam) .... 1/2  To 1 Tab O As Needed Anxiety  Allergies (verified): 1)  ! Penicillin 2)  ! Sulfa 3)  ! Ibuprofen  Physical Exam  General:  NAD--seems much calmer than usual Msk:  Very tender over lateral epicondyle on left--reproduces her pain.  Mildly tender over lateral olecranon. Negative Tinel's over ulnar nerve in medial groove.   Impression & Recommendations:  Problem # 1:  LATERAL EPICONDYLITIS, LEFT (ICD-726.32)  Cannot take NSAIDS  secondary to angioedema  Orders: Physical Therapy Referral (PT)  Problem # 2:  PANIC DISORDER (ICD-300.01) Increase Zoloft to 75 mg Her updated medication list for this problem includes:    Zoloft 50 Mg Tabs  (Sertraline hcl) .Marland Kitchen... 1 1/2 tabs by mouth daily    Lorazepam 1 Mg Tabs (Lorazepam) .Marland Kitchen... 1/2  to 1 tab o as needed anxiety  Problem # 3:  DEPRESSION (ICD-311) As in #2 Her updated medication list for this problem includes:    Zoloft 50 Mg Tabs (Sertraline hcl) .Marland Kitchen... 1 1/2 tabs by mouth daily    Lorazepam 1 Mg Tabs (Lorazepam) .Marland Kitchen... 1/2  to 1 tab o as needed anxiety  Complete Medication List: 1)  Nasacort Aq 55 Mcg/act Aers (Triamcinolone acetonide(nasal)) .... 2 squirts eah nostril once daily 2)  Advair Diskus 100-50 Mcg/dose Misc (Fluticasone-salmeterol) .... One puff two times a day 3)  Singulair 10 Mg Tabs (Montelukast sodium) .... One every afternoon 4)  Proventil Hfa 108 (90 Base) Mcg/act Aers (Albuterol sulfate) .Marland Kitchen.. 1-2 puffs q 4-6 hours as needed 5)  Hydrochlorothiazide 12.5 Mg Caps (Hydrochlorothiazide) .... Take one capsule each morning 6)  Benazepril Hcl 10 Mg Tabs (Benazepril hcl) .... Take one tablet by mouth qam 7)  Loratadine 10 Mg Tabs (Loratadine) .Marland Kitchen.. 1 tab by mouth daily 8)  Aerochamber Mv Misc (Spacer/aero-holding chambers) 9)  Spiriva Handihaler 18 Mcg Caps (Tiotropium bromide monohydrate) .Marland Kitchen.. 1 inhalation daily 10)  Cyclobenzaprine Hcl 5 Mg Tabs (Cyclobenzaprine hcl) .... 1/2 tab by mouth for shoulder or neck pain.  30 tabs per month only 11)  Zoloft 50 Mg Tabs (Sertraline hcl) .Marland Kitchen.. 1 1/2 tabs by mouth daily 12)  Lorazepam 1 Mg Tabs (Lorazepam) .... 1/2  to 1 tab o as needed anxiety 13)  Prednisone 10 Mg Tabs (Prednisone) .... 2 tabs by mouth for 3 days, then 1 tab by mouth for 3 days, then 1/2 tab for 3 days and stop 14)  Air Cast Arm Splint  .... For lateral epicondylitis  Patient Instructions: 1)  Follow up with Dr. Delrae Alfred in 4-6  weeks Prescriptions: AIR CAST ARM SPLINT For lateral epicondylitis  #1 x 0   Entered and Authorized by:   Julieanne Manson MD   Signed by:   Julieanne Manson MD on 04/10/2010   Method used:   Print then Give to Patient    RxID:   [id number] PREDNISONE 10 MG TABS (PREDNISONE) 2 tabs by mouth for 3 days, then 1 tab by mouth for 3 days, then 1/2 tab for 3 days and stop  #11 x 0   Entered and Authorized by:   Julieanne Manson MD   Signed by:   Julieanne Manson MD on 04/10/2010   Method used:   Electronically to        [facility] [phone]* (retail)       [address]       [place], [place]  [address]  Ph: [phone]       Fax: [phone]   RxID:   [id number] CYCLOBENZAPRINE HCL 5 MG TABS (CYCLOBENZAPRINE HCL) 1/2 tab by mouth for shoulder or neck pain.  30 tabs per month only  #30 x 2   Entered and Authorized by:   Julieanne Manson MD   Signed by:   Julieanne Manson MD on 04/10/2010   Method used:   Print then Give to Patient   RxID:   [id number] ZOLOFT 50 MG TABS (SERTRALINE HCL) 1 1/2 tabs by mouth daily  #45 x 11   Entered and Authorized by:   Julieanne Manson MD   Signed by:   Julieanne Manson MD on 04/10/2010   Method used:   Faxed to ...       [facility] (retail)       [address] [place], [place]  [address]       Ph: [phone]       Fax: [phone]   RxID:   [id number]    Orders Added: 1)  Est. Patient Level IV [60109] 2)  Physical Therapy Referral [PT]

## 2010-05-02 LAB — URINALYSIS, ROUTINE W REFLEX MICROSCOPIC
Bilirubin Urine: NEGATIVE
Glucose, UA: NEGATIVE mg/dL
Hgb urine dipstick: NEGATIVE

## 2010-05-02 LAB — POCT I-STAT, CHEM 8
HCT: 42 % (ref 36.0–46.0)
Hemoglobin: 14.3 g/dL (ref 12.0–15.0)
Potassium: 3.9 mEq/L (ref 3.5–5.1)
Sodium: 134 mEq/L — ABNORMAL LOW (ref 135–145)
TCO2: 22 mmol/L (ref 0–100)

## 2010-05-02 LAB — CBC
HCT: 39.8 % (ref 36.0–46.0)
Hemoglobin: 13.6 g/dL (ref 12.0–15.0)
MCH: 31 pg (ref 26.0–34.0)
MCV: 90.7 fL (ref 78.0–100.0)
Platelets: 262 10*3/uL (ref 150–400)
RDW: 13.4 % (ref 11.5–15.5)

## 2010-05-02 LAB — URINE MICROSCOPIC-ADD ON

## 2010-05-14 ENCOUNTER — Encounter (INDEPENDENT_AMBULATORY_CARE_PROVIDER_SITE_OTHER): Payer: Self-pay | Admitting: *Deleted

## 2010-05-20 NOTE — Letter (Signed)
Summary: *HSN Results Follow up  [facility]  [address] [place], [place] [address]   Phone: [phone]  Fax: [phone]      05/14/2010   [facility] [address] [place], [place]  [address]   Dear  [name],                            Comments:  Physical Therapy is been tryimg to get in contact with you . Please, call  to schedule an appointment at your earliest convenience phone # [phone] address [address]  Thank you       _________________________________________________________ If you have any questions, please contact our office                     Sincerely,  Cheryll Dessert [facility]

## 2010-05-23 LAB — WET PREP, GENITAL: Trich, Wet Prep: NONE SEEN

## 2010-05-23 LAB — POCT URINALYSIS DIP (DEVICE)
Glucose, UA: NEGATIVE mg/dL
Ketones, ur: NEGATIVE mg/dL
Nitrite: NEGATIVE
Protein, ur: NEGATIVE mg/dL
Urobilinogen, UA: 0.2 mg/dL (ref 0.0–1.0)
pH: 7 (ref 5.0–8.0)

## 2010-05-23 LAB — GC/CHLAMYDIA PROBE AMP, GENITAL: GC Probe Amp, Genital: NEGATIVE

## 2010-05-29 LAB — STREP A DNA PROBE

## 2010-05-29 LAB — POCT RAPID STREP A (OFFICE): Streptococcus, Group A Screen (Direct): NEGATIVE

## 2010-06-03 ENCOUNTER — Ambulatory Visit: Payer: Self-pay | Attending: Internal Medicine

## 2010-11-25 LAB — POCT RAPID STREP A: Streptococcus, Group A Screen (Direct): NEGATIVE

## 2015-05-11 ENCOUNTER — Emergency Department (HOSPITAL_COMMUNITY): Payer: Self-pay

## 2015-05-11 ENCOUNTER — Encounter (HOSPITAL_COMMUNITY): Payer: Self-pay | Admitting: Emergency Medicine

## 2015-05-11 ENCOUNTER — Emergency Department (HOSPITAL_COMMUNITY)
Admission: EM | Admit: 2015-05-11 | Discharge: 2015-05-11 | Discharge status: Against Medical Advice | Payer: Self-pay | Attending: Emergency Medicine | Admitting: Emergency Medicine

## 2015-05-11 DIAGNOSIS — R079 Chest pain, unspecified: Secondary | ICD-10-CM | POA: Insufficient documentation

## 2015-05-11 DIAGNOSIS — Z87891 Personal history of nicotine dependence: Secondary | ICD-10-CM | POA: Insufficient documentation

## 2015-05-11 DIAGNOSIS — Z880 Allergy status to penicillin: Secondary | ICD-10-CM | POA: Insufficient documentation

## 2015-05-11 DIAGNOSIS — Z8659 Personal history of other mental and behavioral disorders: Secondary | ICD-10-CM | POA: Insufficient documentation

## 2015-05-11 DIAGNOSIS — R55 Syncope and collapse: Secondary | ICD-10-CM | POA: Insufficient documentation

## 2015-05-11 DIAGNOSIS — Z8719 Personal history of other diseases of the digestive system: Secondary | ICD-10-CM | POA: Insufficient documentation

## 2015-05-11 LAB — CBC
HEMATOCRIT: 38.5 % (ref 36.0–46.0)
HEMOGLOBIN: 13.2 g/dL (ref 12.0–15.0)
MCH: 31.2 pg (ref 26.0–34.0)
MCHC: 34.3 g/dL (ref 30.0–36.0)
MCV: 91 fL (ref 78.0–100.0)
Platelets: 295 10*3/uL (ref 150–400)
RBC: 4.23 MIL/uL (ref 3.87–5.11)
RDW: 13.4 % (ref 11.5–15.5)
WBC: 9.2 10*3/uL (ref 4.0–10.5)

## 2015-05-11 LAB — BASIC METABOLIC PANEL
ANION GAP: 11 (ref 5–15)
BUN: 21 mg/dL — ABNORMAL HIGH (ref 6–20)
CO2: 22 mmol/L (ref 22–32)
Calcium: 9 mg/dL (ref 8.9–10.3)
Chloride: 103 mmol/L (ref 101–111)
Creatinine, Ser: 0.79 mg/dL (ref 0.44–1.00)
GFR calc Af Amer: >60 mL/min (ref >60)
Glucose, Bld: 104 mg/dL — ABNORMAL HIGH (ref 65–99)
POTASSIUM: 3.7 mmol/L (ref 3.5–5.1)
SODIUM: 136 mmol/L (ref 135–145)

## 2015-05-11 LAB — I-STAT TROPONIN, ED: Troponin i, poc: 0.02 ng/mL (ref 0.00–0.08)

## 2015-05-11 NOTE — ED Notes (Signed)
patiet off the unit for xray.

## 2015-05-11 NOTE — ED Notes (Signed)
Per EMS, patient is from home, for chest pain, "heavy feeling in chest" that woke her up. Pain was 8/10 initially. Hx of acid reflux, no heart problems. Dad died of MI at age of 63. Pain down to a 2/10. 20g in left AC, given 1 nitro tab. VS: bp 119/65, p 68, 96% on room air.

## 2015-05-11 NOTE — ED Notes (Signed)
Dr. Elesa MassedWard in to see the patient.

## 2015-05-11 NOTE — ED Provider Notes (Signed)
By signing my name below, I, Evon Slackerrance Branch, attest that this documentation has been prepared under the direction and in the presence of Enbridge EnergyKristen N Ukiah Trawick, DO. Electronically Signed: Evon Slackerrance Branch, ED Scribe. 05/11/2015. 2:36 AM.  TIME SEEN: 2:27 AM   CHIEF COMPLAINT: Chest Pain  HPI: HPI Comments: Debra Sharp is a 65 y.o. female who presents to the Emergency Department complaining of resolved CP onset tonight at 12 AM. Pt states that she believes she had a panic attack. Pt describes the pain as a pressure in her chest. Pt states she had discomfort that she thought was acid reflux. She states that the onset of pain began tonight while laying in bed. She states she went into her kitchen to get a sprite and the pain got worse. States she began to get hot and clammy and felt as of she was going to pass out. Pt states that the pain felt like previous acid reflux and anxiety. She states that laying on her left side makes the pain better. She states that losing consciousness is normal for her when she has a panic attack. Denies tobacco use in 14 years. Denies Hx of DVT or PE.    ROS: See HPI Constitutional: no fever  Eyes: no drainage  ENT: no runny nose   Cardiovascular:   chest pain  Resp: no SOB  GI: no vomiting GU: no dysuria Integumentary: no rash  Allergy: no hives  Musculoskeletal: no leg swelling  Neurological: no slurred speech ROS otherwise negative  PAST MEDICAL HISTORY/PAST SURGICAL HISTORY:  History reviewed. No pertinent past medical history.  MEDICATIONS:  Prior to Admission medications   Not on File    ALLERGIES:  Allergies  Allergen Reactions  . Aspirin     Asthma attack  . Ibuprofen     REACTION: angioedema  . Penicillins     REACTION: rash  . Sulfonamide Derivatives     REACTION: facial swells    SOCIAL HISTORY:  Social History  Substance Use Topics  . Smoking status: Former Games developermoker  . Smokeless tobacco: Not on file  . Alcohol Use: No    FAMILY  HISTORY: History reviewed. No pertinent family history.  EXAM: BP 105/64 mmHg  Pulse 64  Temp(Src) 97.4 F (36.3 C) (Oral)  Resp 21  Ht 5\' 6"  (1.676 m)  Wt 190 lb (86.183 kg)  BMI 30.68 kg/m2  SpO2 100% CONSTITUTIONAL: Alert and oriented and responds appropriately to questions. Elderly, in no distress HEAD: Normocephalic EYES: Conjunctivae clear, PERRL ENT: normal nose; no rhinorrhea; moist mucous membranes NECK: Supple, no meningismus, no LAD  CARD: RRR; S1 and S2 appreciated; no murmurs, no clicks, no rubs, no gallops RESP: Normal chest excursion without splinting or tachypnea; breath sounds clear and equal bilaterally; no wheezes, no rhonchi, no rales, no hypoxia or respiratory distress, speaking full sentences ABD/GI: Normal bowel sounds; non-distended; soft, non-tender, no rebound, no guarding, no peritoneal signs BACK:  The back appears normal and is non-tender to palpation, there is no CVA tenderness EXT: Normal ROM in all joints; non-tender to palpation; no edema; normal capillary refill; no cyanosis, no calf tenderness or swelling    SKIN: Normal color for age and race; warm; no rash NEURO: Moves all extremities equally, sensation to light touch intact diffusely, cranial nerves II through XII intact PSYCH: The patient's mood and manner are appropriate. Grooming and personal hygiene are appropriate.  MEDICAL DECISION MAKING: Patient here with complaints of chest pain and near syncopal event. She seems to be  minimizing her symptoms attributed them to acid reflux and panic attacks. Discussed with her that she does have risk factors for ACS and I would like to work her up. Doubt PE or dissection given she is asymptomatic currently. She did receive nitroglycerin with EMS. Patient agrees to stay for 1 set of cardiac labs but does not want admission and states she is unlikely to stay for a second set of labs. She is willing to discuss this again once her first set of tests are  back.  ED PROGRESS: Patient's labs are unremarkable. Troponin negative. Chest x-ray clear. EKG shows no ischemic change. She still hemodynamically stable and chest pain-free. Patient does not want to stay for second set of labs. I discussed with her that we cannot rule out that this was not a heart attack or other life-threatening event without further evaluation and have strongly encouraged her and her son to stay. Patient refuses. I feel she has competency and capacity to make this decision for herself. She is not intoxicated. She appears to understand risks including worsening symptoms, severe disability and death. We will have her sign out AGAINST MEDICAL ADVICE. She states she will follow up with her primary care physician. Have advised her to return to the emergency department if she changes her mind or if her symptoms return. Discussed return precautions.   I have reviewed and discussed all results (EKG, imaging, lab, urine as appropriate), exam findings with patient. I have reviewed nursing notes and appropriate previous records. Discussed usual and customary return precautions. Patient and family (if present) verbalize understanding.   EKG Interpretation  Date/Time:  Saturday May 11 2015 01:25:22 EDT Ventricular Rate:  60 PR Interval:  153 QRS Duration: 109 QT Interval:  444 QTC Calculation: 444 R Axis:   74 Text Interpretation:  Sinus rhythm No old tracing to compare Confirmed by Maxfield Gildersleeve,  DO, Baudelia Schroepfer ([id number]) on 05/11/2015 1:29:52 AM        I personally performed the services described in this documentation, which was scribed in my presence. The recorded information has been reviewed and is accurate.   Layla Maw Aarib Pulido, DO 05/11/15 [phone]

## 2015-05-11 NOTE — ED Notes (Signed)
Patient returned and phlebotomy now at the bedside.

## 2015-05-11 NOTE — Discharge Instructions (Signed)
Nonspecific Chest Pain  °Chest pain can be caused by many different conditions. There is always a chance that your pain could be related to something serious, such as a heart attack or a blood clot in your lungs. Chest pain can also be caused by conditions that are not life-threatening. If you have chest pain, it is very important to follow up with your health care provider. °CAUSES  °Chest pain can be caused by: °· Heartburn. °· Pneumonia or bronchitis. °· Anxiety or stress. °· Inflammation around your heart (pericarditis) or lung (pleuritis or pleurisy). °· A blood clot in your lung. °· A collapsed lung (pneumothorax). It can develop suddenly on its own (spontaneous pneumothorax) or from trauma to the chest. °· Shingles infection (varicella-zoster virus). °· Heart attack. °· Damage to the bones, muscles, and cartilage that make up your chest wall. This can include: °¨ Bruised bones due to injury. °¨ Strained muscles or cartilage due to frequent or repeated coughing or overwork. °¨ Fracture to one or more ribs. °¨ Sore cartilage due to inflammation (costochondritis). °RISK FACTORS  °Risk factors for chest pain may include: °· Activities that increase your risk for trauma or injury to your chest. °· Respiratory infections or conditions that cause frequent coughing. °· Medical conditions or overeating that can cause heartburn. °· Heart disease or family history of heart disease. °· Conditions or health behaviors that increase your risk of developing a blood clot. °· Having had chicken pox (varicella zoster). °SIGNS AND SYMPTOMS °Chest pain can feel like: °· Burning or tingling on the surface of your chest or deep in your chest. °· Crushing, pressure, aching, or squeezing pain. °· Dull or sharp pain that is worse when you move, cough, or take a deep breath. °· Pain that is also felt in your back, neck, shoulder, or arm, or pain that spreads to any of these areas. °Your chest pain may come and go, or it may stay  constant. °DIAGNOSIS °Lab tests or other studies may be needed to find the cause of your pain. Your health care provider may have you take a test called an ambulatory ECG (electrocardiogram). An ECG records your heartbeat patterns at the time the test is performed. You may also have other tests, such as: °· Transthoracic echocardiogram (TTE). During echocardiography, sound waves are used to create a picture of all of the heart structures and to look at how blood flows through your heart. °· Transesophageal echocardiogram (TEE). This is a more advanced imaging test that obtains images from inside your body. It allows your health care provider to see your heart in finer detail. °· Cardiac monitoring. This allows your health care provider to monitor your heart rate and rhythm in real time. °· Holter monitor. This is a portable device that records your heartbeat and can help to diagnose abnormal heartbeats. It allows your health care provider to track your heart activity for several days, if needed. °· Stress tests. These can be done through exercise or by taking medicine that makes your heart beat more quickly. °· Blood tests. °· Imaging tests. °TREATMENT  °Your treatment depends on what is causing your chest pain. Treatment may include: °· Medicines. These may include: °¨ Acid blockers for heartburn. °¨ Anti-inflammatory medicine. °¨ Pain medicine for inflammatory conditions. °¨ Antibiotic medicine, if an infection is present. °¨ Medicines to dissolve blood clots. °¨ Medicines to treat coronary artery disease. °· Supportive care for conditions that do not require medicines. This may include: °¨ Resting. °¨ Applying heat   or cold packs to injured areas.  Limiting activities until pain decreases. HOME CARE INSTRUCTIONS  If you were prescribed an antibiotic medicine, finish it all even if you start to feel better.  Avoid any activities that bring on chest pain.  Do not use any tobacco products, including  cigarettes, chewing tobacco, or electronic cigarettes. If you need help quitting, ask your health care provider.  Do not drink alcohol.  Take medicines only as directed by your health care provider.  Keep all follow-up visits as directed by your health care provider. This is important. This includes any further testing if your chest pain does not go away.  If heartburn is the cause for your chest pain, you may be told to keep your head raised (elevated) while sleeping. This reduces the chance that acid will go from your stomach into your esophagus.  Make lifestyle changes as directed by your health care provider. These may include:  Getting regular exercise. Ask your health care provider to suggest some activities that are safe for you.  Eating a heart-healthy diet. A registered dietitian can help you to learn healthy eating options.  Maintaining a healthy weight.  Managing diabetes, if necessary.  Reducing stress. SEEK MEDICAL CARE IF:  Your chest pain does not go away after treatment.  You have a rash with blisters on your chest.  You have a fever. SEEK IMMEDIATE MEDICAL CARE IF:   Your chest pain is worse.  You have an increasing cough, or you cough up blood.  You have severe abdominal pain.  You have severe weakness.  You faint.  You have chills.  You have sudden, unexplained chest discomfort.  You have sudden, unexplained discomfort in your arms, back, neck, or jaw.  You have shortness of breath at any time.  You suddenly start to sweat, or your skin gets clammy.  You feel nauseous or you vomit.  You suddenly feel light-headed or dizzy.  Your heart begins to beat quickly, or it feels like it is skipping beats. These symptoms may represent a serious problem that is an emergency. Do not wait to see if the symptoms will go away. Get medical help right away. Call your local emergency services (911 in the [place] Do not drive yourself to the hospital.   This  information is not intended to replace advice given to you by your health care provider. Make sure you discuss any questions you have with your health care provider.   Document Released: 11/12/2004 Document Revised: 02/23/2014 Document Reviewed: 09/08/2013 Elsevier Interactive Patient Education 2016 [employer]   Acknowledgement of Risk of Discharge  Against Medical Advice   And Release of Liability    Because I am choosing to leave the hospital in spite of these risks, I release the hospital, its employees and officers, and my attending physician from all liability for any adverse results caused by my leaving the hospital prematurely.   ________________________________      _____________________________________ Patient's Signature                                        Date and Time   ________________________________      _____________________________________ Witness' Signature  Relationship to Patient    ________________________________      _____________________________________ Witness' Signature                                        Relationship to Patient   [If patient refuses to sign, write "Patient refused to sign" on the patient's signature line and have witnesses to the refusal sign as witnesses.]

## 2015-05-11 NOTE — ED Notes (Signed)
Called dr. Elesa MassedWard to report patient is ready to leave [facility]. MD acknowledges, already reviewed with patient that repeat labs were needed, patient still ready to go. She signs self out [facility]a.

## 2016-07-07 IMAGING — DX DG CHEST 2V
2 series · 2 of 2 positions shown · non-contrast
Comparison: 10/23/2009

CLINICAL DATA: Chest pain and indigestion.  Panic attack.

EXAM:
CHEST  2 VIEW

[chest pa]
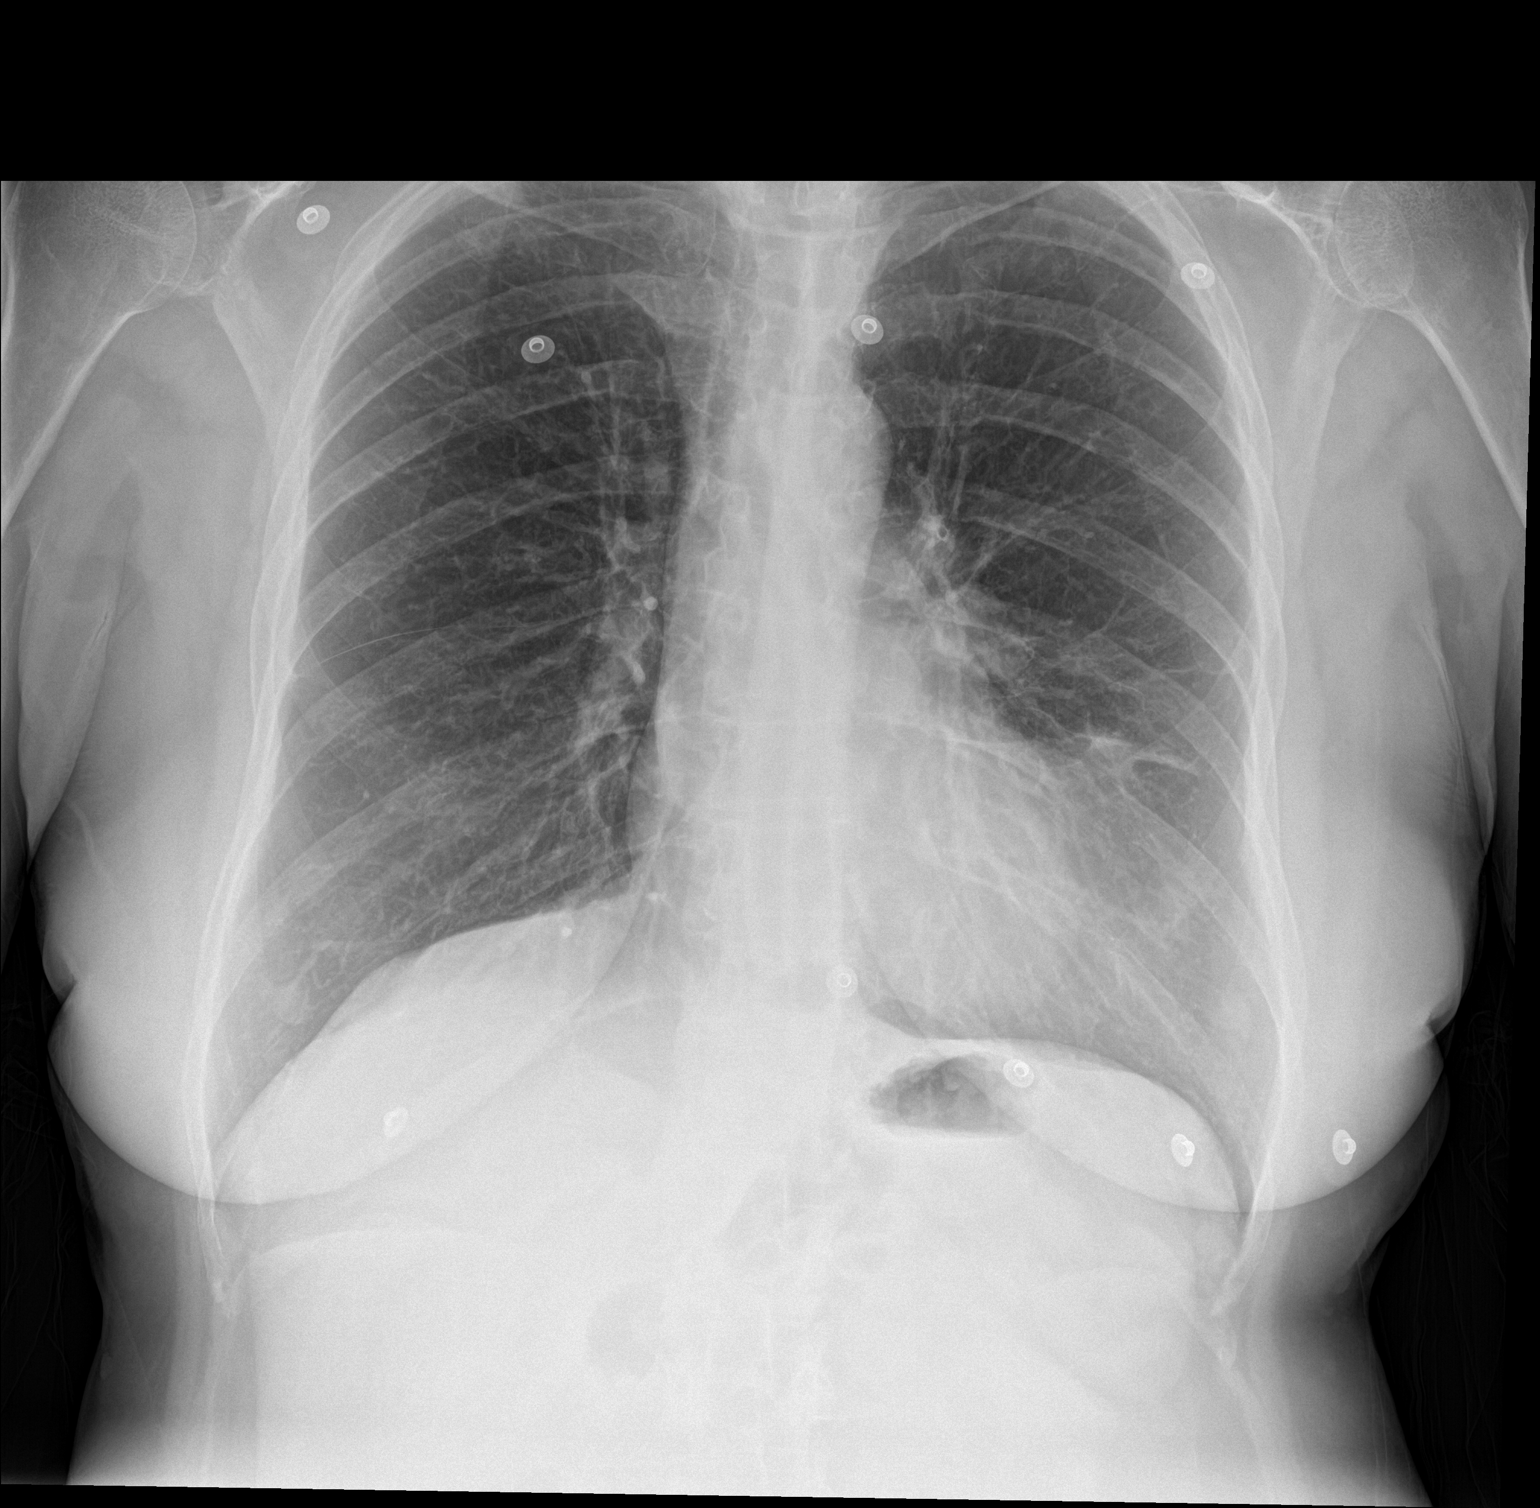

[chest lat]
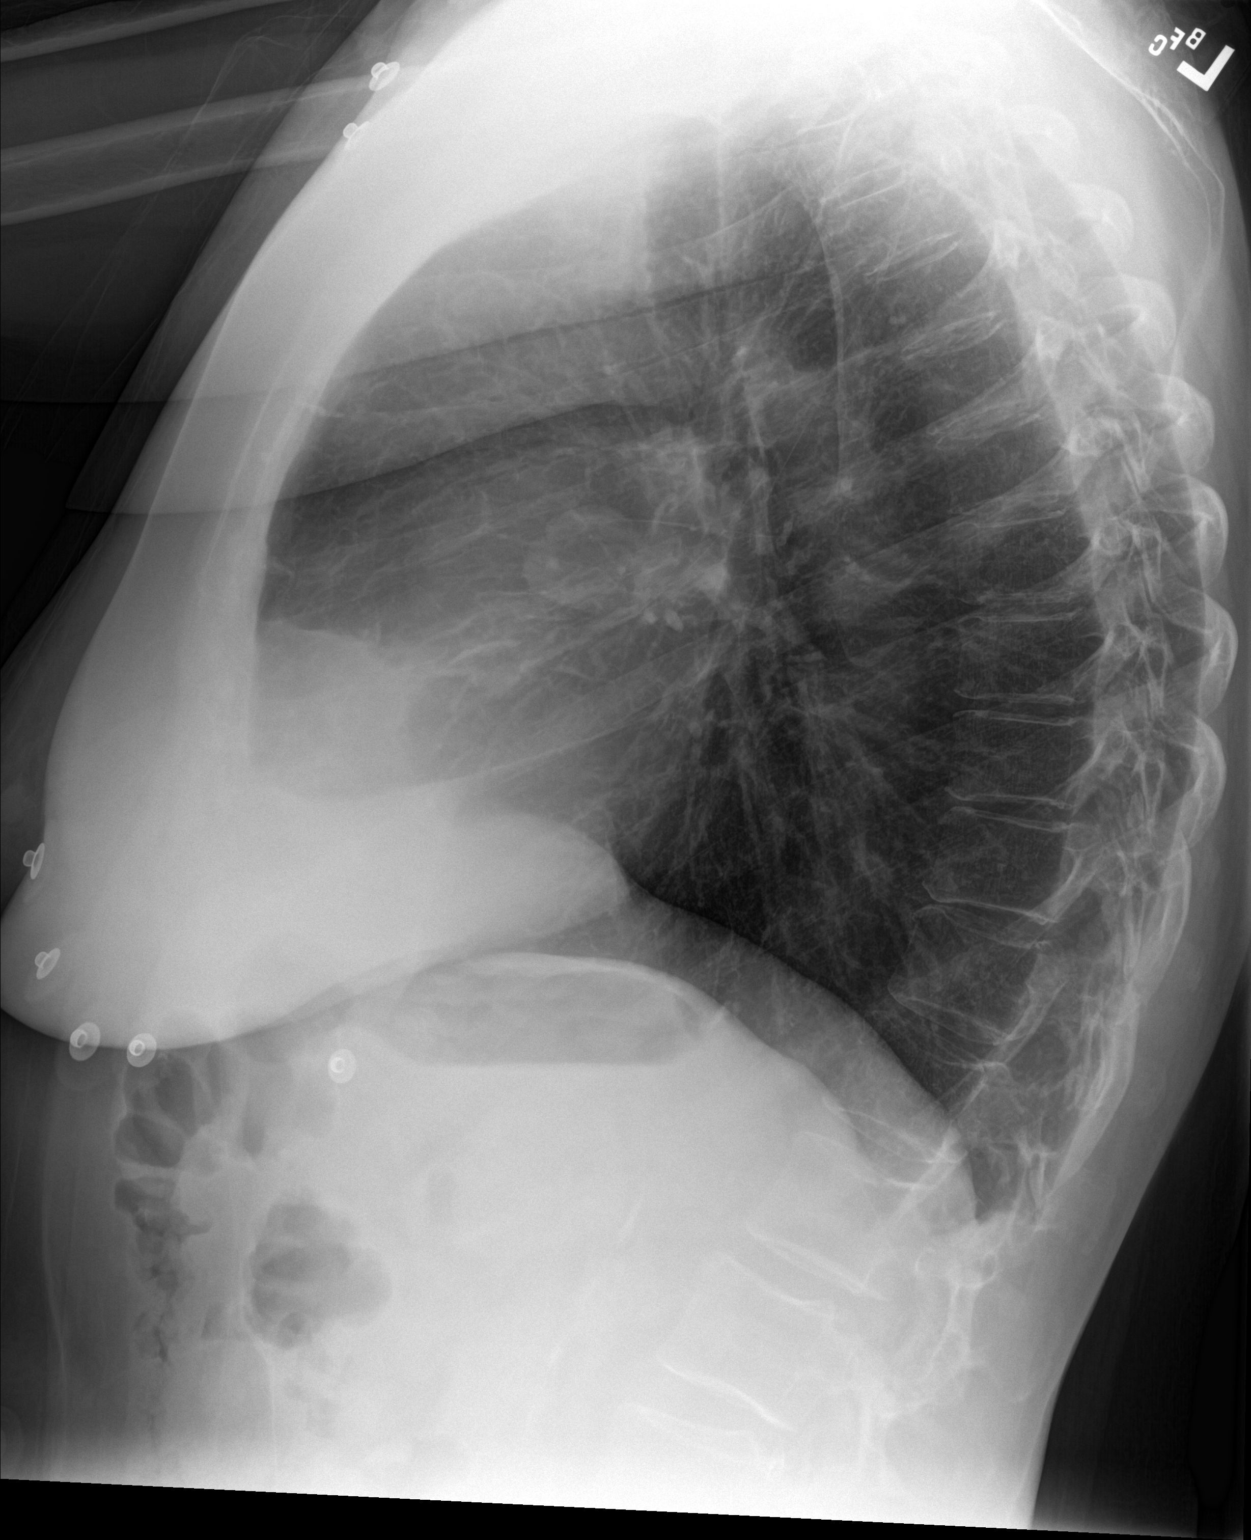

[2 of 2 positions shown; findings below may reference images not displayed]

FINDINGS: Hyperinflation suggesting emphysema. Scattered fibrosis in the
lungs. Peribronchial thickening and central fibrosis probably
representing chronic bronchitis. No focal airspace disease or
consolidation. Prominent nipple shadows over the lung bases. Normal
heart size and pulmonary vascularity. No blunting of costophrenic
angles. No pneumothorax.
IMPRESSION: Emphysematous changes and chronic bronchitic changes in the lungs.
No evidence of active pulmonary disease.

## 2019-04-09 ENCOUNTER — Ambulatory Visit (INDEPENDENT_AMBULATORY_CARE_PROVIDER_SITE_OTHER)
Admission: RE | Admit: 2019-04-09 | Discharge: 2019-04-09 | Disposition: A | Discharge status: Home / Self Care | Payer: Self-pay | Source: Ambulatory Visit

## 2019-04-09 DIAGNOSIS — J320 Chronic maxillary sinusitis: Secondary | ICD-10-CM

## 2019-04-09 MED ORDER — PREDNISONE 10 MG (21) PO TBPK: ORAL_TABLET | ORAL | 0 refills | Status: DC

## 2019-04-09 NOTE — Discharge Instructions (Signed)
Take the prednisone as prescribed Follow up as needed for continued or worsening symptoms  

## 2019-04-09 NOTE — ED Provider Notes (Signed)
Virtual Visit via Video Note:  Debra Sharp  initiated request for Telemedicine visit with [facility] Urgent [facility] team. I connected with Debra Sharp  on 04/09/2019 at 3:16 PM  for a synchronized telemedicine visit using a video enabled HIPPA compliant telemedicine application. I verified that I am speaking with Debra Sharp  using two identifiers. Debra Aris, NP  was physically located in a [facility] Urgent care site and Debra Sharp was located at a different location.   The limitations of evaluation and management by telemedicine as well as the availability of in-person appointments were discussed. Patient was informed that she  may incur a bill ( including co-pay) for this virtual visit encounter. Debra Sharp  expressed understanding and gave verbal consent to proceed with virtual visit.     History of Present Illness:Debra Sharp  is a 69 y.o. female presents with chronic sinus issues. Hx of polyps and sinus surgeries in the past.  Reports that she has been taking sudafed, saline, Flonase and zyrtec which is her typical regimen. Worse over the last 3 days. Typically inproved with prednisone.  No fever, chills, body aches, cough, chest congestion  No past medical history on file.  Allergies  Allergen Reactions  . Aspirin     Asthma attack  . Ibuprofen     REACTION: angioedema  . Penicillins     REACTION: rash  . Sulfonamide Derivatives     REACTION: facial swells        Observations/Objective: VITALS: Per patient if applicable, see vitals. GENERAL: Alert, appears well and in no acute distress. HEENT: Atraumatic, conjunctiva clear, no obvious abnormalities on inspection of external nose and ears.  Sounds congested NECK: Normal movements of the head and neck. CARDIOPULMONARY: No increased WOB. Speaking in clear sentences. I:E ratio WNL.  MS: Moves all visible extremities without noticeable abnormality. PSYCH: Pleasant and cooperative, well-groomed. Speech normal rate and  rhythm. Affect is appropriate. Insight and judgement are appropriate. Attention is focused, linear, and appropriate.  NEURO: CN grossly intact. Oriented as arrived to appointment on time with no prompting. Moves both UE equally.  SKIN: No obvious lesions, wounds, erythema, or cyanosis noted on face or hands.     Assessment and Plan: Chronic sinusitis-treating with prednisone Recommend follow-up for any continued or worsening problems   Follow Up Instructions: Follow up as needed for continued or worsening symptoms     I discussed the assessment and treatment plan with the patient. The patient was provided an opportunity to ask questions and all were answered. The patient agreed with the plan and demonstrated an understanding of the instructions.   The patient was advised to call back or seek an in-person evaluation if the symptoms worsen or if the condition fails to improve as anticipated.    Debra Aris, NP  04/09/2019 3:16 PM         Debra Aris, NP 04/09/19 [id number]

## 2019-10-09 ENCOUNTER — Telehealth: Payer: Medicare Other | Admitting: Family

## 2019-10-09 DIAGNOSIS — J069 Acute upper respiratory infection, unspecified: Secondary | ICD-10-CM

## 2019-10-09 MED ORDER — FLUTICASONE PROPIONATE 50 MCG/ACT NA SUSP: 2.0000 | Freq: Every day | NASAL | 6 refills | Status: DC

## 2019-10-09 MED ORDER — CETIRIZINE HCL 10 MG PO TABS
10.0000 mg | ORAL_TABLET | Freq: Every day | ORAL | 11 refills | Status: DC
Start: 2019-10-09 — End: 2022-09-16

## 2019-10-09 NOTE — Progress Notes (Signed)
We are sorry you are not feeling well.  Here is how we plan to help!  Based on what you have shared with me, it looks like you may have a viral upper respiratory infection.  Upper respiratory infections are caused by a large number of viruses; however, rhinovirus is the most common cause.   Symptoms vary from person to person, with common symptoms including sore throat, cough, fatigue or lack of energy and feeling of general discomfort.  A low-grade fever of up to 100.4 may present, but is often uncommon.  Symptoms vary however, and are closely related to a person's age or underlying illnesses.  The most common symptoms associated with an upper respiratory infection are nasal discharge or congestion, cough, sneezing, headache and pressure in the ears and face.  These symptoms usually persist for about 3 to 10 days, but can last up to 2 weeks.  It is important to know that upper respiratory infections do not cause serious illness or complications in most cases.    Upper respiratory infections can be transmitted from person to person, with the most common method of transmission being a person's hands.  The virus is able to live on the skin and can infect other persons for up to 2 hours after direct contact.  Also, these can be transmitted when someone coughs or sneezes; thus, it is important to cover the mouth to reduce this risk.  To keep the spread of the illness at bay, good hand hygiene is very important.  This is an infection that is most likely caused by a virus. There are no specific treatments other than to help you with the symptoms until the infection runs its course.  We are sorry you are not feeling well.  Here is how we plan to help!   For nasal congestion, you may use an oral decongestants such as Mucinex D or if you have glaucoma or high blood pressure use plain Mucinex.  Saline nasal spray or nasal drops can help and can safely be used as often as needed for congestion.  For your congestion,  I have prescribed Fluticasone nasal spray one spray in each nostril twice a day and zyrtec 10 mg daily.   If you do not have a history of heart disease, hypertension, diabetes or thyroid disease, prostate/bladder issues or glaucoma, you may also use Sudafed to treat nasal congestion.  It is highly recommended that you consult with a pharmacist or your primary care physician to ensure this medication is safe for you to take.     If you have a cough, you may use cough suppressants such as Delsym and Robitussin.  If you have glaucoma or high blood pressure, you can also use Coricidin HBP.     If you have a sore or scratchy throat, use a saltwater gargle-  to  teaspoon of salt dissolved in a 4-ounce to 8-ounce glass of warm water.  Gargle the solution for approximately 15-30 seconds and then spit.  It is important not to swallow the solution.  You can also use throat lozenges/cough drops and Chloraseptic spray to help with throat pain or discomfort.  Warm or cold liquids can also be helpful in relieving throat pain.  For headache, pain or general discomfort, you can use Ibuprofen or Tylenol as directed.   Some authorities believe that zinc sprays or the use of Echinacea may shorten the course of your symptoms.   HOME CARE . Only take medications as instructed by your medical   team. . Be sure to drink plenty of fluids. Water is fine as well as fruit juices, sodas and electrolyte beverages. You may want to stay away from caffeine or alcohol. If you are nauseated, try taking small sips of liquids. How do you know if you are getting enough fluid? Your urine should be a pale yellow or almost colorless. . Get rest. . Taking a steamy shower or using a humidifier may help nasal congestion and ease sore throat pain. You can place a towel over your head and breathe in the steam from hot water coming from a faucet. . Using a saline nasal spray works much the same way. . Cough drops, hard candies and sore throat  lozenges may ease your cough. . Avoid close contacts especially the very young and the elderly . Cover your mouth if you cough or sneeze . Always remember to wash your hands.   GET HELP RIGHT AWAY IF: . You develop worsening fever. . If your symptoms do not improve within 10 days . You develop yellow or green discharge from your nose over 3 days. . You have coughing fits . You develop a severe head ache or visual changes. . You develop shortness of breath, difficulty breathing or start having chest pain . Your symptoms persist after you have completed your treatment plan  MAKE SURE YOU   Understand these instructions.  Will watch your condition.  Will get help right away if you are not doing well or get worse.  Your e-visit answers were reviewed by a board certified advanced clinical practitioner to complete your personal care plan. Depending upon the condition, your plan could have included both over the counter or prescription medications. Please review your pharmacy choice. If there is a problem, you may call our nursing hot line at and have the prescription routed to another pharmacy. Your safety is important to us. If you have drug allergies check your prescription carefully.   You can use MyChart to ask questions about today's visit, request a non-urgent call back, or ask for a work or school excuse for 24 hours related to this e-Visit. If it has been greater than 24 hours you will need to follow up with your provider, or enter a new e-Visit to address those concerns. You will get an e-mail in the next two days asking about your experience.  I hope that your e-visit has been valuable and will speed your recovery. Thank you for using e-visits.   Approximately 5 minutes was spent documenting and reviewing patient's chart.     

## 2020-03-22 ENCOUNTER — Telehealth: Payer: Self-pay | Admitting: Family Medicine

## 2020-03-22 NOTE — Progress Notes (Unsigned)
No show

## 2022-04-18 ENCOUNTER — Ambulatory Visit (HOSPITAL_COMMUNITY): Payer: Self-pay

## 2022-04-24 ENCOUNTER — Inpatient Hospital Stay (HOSPITAL_COMMUNITY)
Admission: EM | Admit: 2022-04-24 | Discharge: 2022-04-25 | DRG: 291 | Discharge status: Against Medical Advice | Payer: Medicare Other | Attending: Internal Medicine | Admitting: Internal Medicine

## 2022-04-24 ENCOUNTER — Other Ambulatory Visit (HOSPITAL_COMMUNITY): Payer: Self-pay

## 2022-04-24 ENCOUNTER — Emergency Department (HOSPITAL_COMMUNITY): Payer: Medicare Other

## 2022-04-24 ENCOUNTER — Other Ambulatory Visit: Payer: Self-pay

## 2022-04-24 DIAGNOSIS — Z79899 Other long term (current) drug therapy: Secondary | ICD-10-CM | POA: Diagnosis not present

## 2022-04-24 DIAGNOSIS — R0602 Shortness of breath: Secondary | ICD-10-CM

## 2022-04-24 DIAGNOSIS — Z886 Allergy status to analgesic agent status: Secondary | ICD-10-CM | POA: Diagnosis not present

## 2022-04-24 DIAGNOSIS — I110 Hypertensive heart disease with heart failure: Secondary | ICD-10-CM | POA: Diagnosis present

## 2022-04-24 DIAGNOSIS — J4489 Other specified chronic obstructive pulmonary disease: Secondary | ICD-10-CM | POA: Diagnosis present

## 2022-04-24 DIAGNOSIS — Z882 Allergy status to sulfonamides status: Secondary | ICD-10-CM

## 2022-04-24 DIAGNOSIS — I2489 Other forms of acute ischemic heart disease: Secondary | ICD-10-CM | POA: Diagnosis present

## 2022-04-24 DIAGNOSIS — I509 Heart failure, unspecified: Secondary | ICD-10-CM | POA: Diagnosis present

## 2022-04-24 DIAGNOSIS — Z87891 Personal history of nicotine dependence: Secondary | ICD-10-CM

## 2022-04-24 DIAGNOSIS — Z7951 Long term (current) use of inhaled steroids: Secondary | ICD-10-CM

## 2022-04-24 DIAGNOSIS — F32A Depression, unspecified: Secondary | ICD-10-CM | POA: Diagnosis present

## 2022-04-24 DIAGNOSIS — J90 Pleural effusion, not elsewhere classified: Secondary | ICD-10-CM | POA: Diagnosis present

## 2022-04-24 DIAGNOSIS — I5031 Acute diastolic (congestive) heart failure: Secondary | ICD-10-CM | POA: Insufficient documentation

## 2022-04-24 DIAGNOSIS — Z880 Allergy status to penicillin: Secondary | ICD-10-CM

## 2022-04-24 DIAGNOSIS — D75839 Thrombocytosis, unspecified: Secondary | ICD-10-CM | POA: Diagnosis present

## 2022-04-24 DIAGNOSIS — F419 Anxiety disorder, unspecified: Secondary | ICD-10-CM | POA: Diagnosis present

## 2022-04-24 DIAGNOSIS — E785 Hyperlipidemia, unspecified: Secondary | ICD-10-CM | POA: Diagnosis present

## 2022-04-24 DIAGNOSIS — D649 Anemia, unspecified: Secondary | ICD-10-CM | POA: Diagnosis present

## 2022-04-24 DIAGNOSIS — I10 Essential (primary) hypertension: Secondary | ICD-10-CM | POA: Diagnosis present

## 2022-04-24 DIAGNOSIS — Z1152 Encounter for screening for COVID-19: Secondary | ICD-10-CM | POA: Diagnosis not present

## 2022-04-24 DIAGNOSIS — D509 Iron deficiency anemia, unspecified: Secondary | ICD-10-CM | POA: Diagnosis present

## 2022-04-24 DIAGNOSIS — R040 Epistaxis: Secondary | ICD-10-CM | POA: Diagnosis present

## 2022-04-24 DIAGNOSIS — R7989 Other specified abnormal findings of blood chemistry: Secondary | ICD-10-CM | POA: Insufficient documentation

## 2022-04-24 LAB — CBC
HCT: 17.3 % — ABNORMAL LOW (ref 36.0–46.0)
Hemoglobin: 5 g/dL — CL (ref 12.0–15.0)
MCH: 66.3 pg — ABNORMAL HIGH (ref 26.0–34.0)
MCHC: 28.9 g/dL — ABNORMAL LOW (ref 30.0–36.0)
MCV: 66.3 fL — ABNORMAL LOW (ref 80.0–100.0)
Platelets: 1548 10*3/uL (ref 150–400)
RBC: 2.61 MIL/uL — ABNORMAL LOW (ref 3.87–5.11)
RDW: 20.6 % — ABNORMAL HIGH (ref 11.5–15.5)
WBC: 14.8 10*3/uL — ABNORMAL HIGH (ref 4.0–10.5)

## 2022-04-24 LAB — RETICULOCYTES
Immature Retic Fract: 31.1 % — ABNORMAL HIGH (ref 2.3–15.9)
RBC.: 2.62 MIL/uL — ABNORMAL LOW (ref 3.87–5.11)
Retic Count, Absolute: 60 10*3/uL (ref 19.0–186.0)
Retic Ct Pct: 2.3 % (ref 0.4–3.1)

## 2022-04-24 LAB — IRON AND TIBC
Iron: 9 ug/dL — ABNORMAL LOW (ref 28–170)
Saturation Ratios: 3 % — ABNORMAL LOW (ref 10.4–31.8)
TIBC: 314 ug/dL (ref 250–450)
UIBC: 305 ug/dL

## 2022-04-24 LAB — TROPONIN I (HIGH SENSITIVITY)
Troponin I (High Sensitivity): 359 ng/L (ref <18)
Troponin I (High Sensitivity): 392 ng/L (ref <18)

## 2022-04-24 LAB — BASIC METABOLIC PANEL
Anion gap: 6 (ref 5–15)
BUN: 12 mg/dL (ref 8–23)
CO2: 24 mmol/L (ref 22–32)
Calcium: 8 mg/dL — ABNORMAL LOW (ref 8.9–10.3)
Chloride: 106 mmol/L (ref 98–111)
Creatinine, Ser: 0.75 mg/dL (ref 0.44–1.00)
GFR, Estimated: >60 mL/min (ref >60)
Glucose, Bld: 128 mg/dL — ABNORMAL HIGH (ref 70–99)
Potassium: 3.7 mmol/L (ref 3.5–5.1)
Sodium: 136 mmol/L (ref 135–145)

## 2022-04-24 LAB — HEPATIC FUNCTION PANEL
ALT: 10 U/L (ref 0–44)
AST: 17 U/L (ref 15–41)
Albumin: 2.2 g/dL — ABNORMAL LOW (ref 3.5–5.0)
Alkaline Phosphatase: 96 U/L (ref 38–126)
Bilirubin, Direct: <0.1 mg/dL (ref 0.0–0.2)
Total Bilirubin: 0.4 mg/dL (ref 0.3–1.2)
Total Protein: 5.8 g/dL — ABNORMAL LOW (ref 6.5–8.1)

## 2022-04-24 LAB — PREPARE RBC (CROSSMATCH)

## 2022-04-24 LAB — TECHNOLOGIST SMEAR REVIEW: WBC MORPHOLOGY: ABNORMAL

## 2022-04-24 LAB — DIFFERENTIAL
Band Neutrophils: 0 %
Basophils Absolute: 0.3 10*3/uL — ABNORMAL HIGH (ref 0.0–0.1)
Basophils Relative: 2 %
Eosinophils Absolute: 0 10*3/uL (ref 0.0–0.5)
Eosinophils Relative: 0 %
Lymphocytes Relative: 5 %
Lymphs Abs: 0.7 10*3/uL (ref 0.7–4.0)
Monocytes Absolute: 0.4 10*3/uL (ref 0.1–1.0)
Monocytes Relative: 3 %
Neutro Abs: 14.1 10*3/uL — ABNORMAL HIGH (ref 1.7–7.7)
Neutrophils Relative %: 90 %

## 2022-04-24 LAB — RESP PANEL BY RT-PCR (RSV, FLU A&B, COVID)  RVPGX2
Influenza A by PCR: NEGATIVE
Influenza B by PCR: NEGATIVE
Resp Syncytial Virus by PCR: NEGATIVE
SARS Coronavirus 2 by RT PCR: NEGATIVE

## 2022-04-24 LAB — D-DIMER, QUANTITATIVE: D-Dimer, Quant: 0.61 ug/mL-FEU — ABNORMAL HIGH (ref 0.00–0.50)

## 2022-04-24 LAB — LACTATE DEHYDROGENASE: LDH: 165 U/L (ref 98–192)

## 2022-04-24 LAB — FERRITIN: Ferritin: 15 ng/mL (ref 11–307)

## 2022-04-24 LAB — POC OCCULT BLOOD, ED: Fecal Occult Bld: POSITIVE — AB

## 2022-04-24 LAB — HIV ANTIBODY (ROUTINE TESTING W REFLEX): HIV Screen 4th Generation wRfx: NONREACTIVE

## 2022-04-24 LAB — TSH: TSH: 1.114 u[IU]/mL (ref 0.350–4.500)

## 2022-04-24 LAB — BRAIN NATRIURETIC PEPTIDE: B Natriuretic Peptide: 955.2 pg/mL — ABNORMAL HIGH (ref 0.0–100.0)

## 2022-04-24 LAB — ABO/RH: ABO/RH(D): A POS

## 2022-04-24 MED ORDER — FERROUS SULFATE 325 (65 FE) MG PO TABS
325.0000 mg | ORAL_TABLET | Freq: Every day | ORAL | Status: DC
  Administered 2022-04-25: 325 mg via ORAL
  Filled 2022-04-24: qty 1

## 2022-04-24 MED ORDER — FUROSEMIDE 10 MG/ML IJ SOLN
60.0000 mg | INTRAMUSCULAR | Status: AC
  Administered 2022-04-24: 60 mg via INTRAVENOUS
  Filled 2022-04-24: qty 6

## 2022-04-24 MED ORDER — IPRATROPIUM-ALBUTEROL 0.5-2.5 (3) MG/3ML IN SOLN
3.0000 mL | Freq: Once | RESPIRATORY_TRACT | Status: AC
  Administered 2022-04-24: 3 mL via RESPIRATORY_TRACT
  Filled 2022-04-24: qty 3

## 2022-04-24 MED ORDER — IRON SUCROSE 500 MG IVPB - SIMPLE MED
500.0000 mg | Freq: Once | INTRAVENOUS | Status: AC
  Administered 2022-04-24: 500 mg via INTRAVENOUS
  Filled 2022-04-24: qty 275

## 2022-04-24 MED ORDER — ONDANSETRON HCL 4 MG/2ML IJ SOLN: 4.0000 mg | Freq: Four times a day (QID) | INTRAMUSCULAR | Status: DC | PRN

## 2022-04-24 MED ORDER — ENOXAPARIN SODIUM 40 MG/0.4ML IJ SOSY
40.0000 mg | PREFILLED_SYRINGE | INTRAMUSCULAR | Status: DC
  Administered 2022-04-24: 40 mg via SUBCUTANEOUS
  Filled 2022-04-24: qty 0.4

## 2022-04-24 MED ORDER — SODIUM CHLORIDE 0.9% FLUSH
3.0000 mL | Freq: Two times a day (BID) | INTRAVENOUS | Status: DC
  Administered 2022-04-24 (×2): 3 mL via INTRAVENOUS

## 2022-04-24 MED ORDER — LORATADINE 10 MG PO TABS: 10.0000 mg | ORAL_TABLET | Freq: Every day | ORAL | Status: DC

## 2022-04-24 MED ORDER — ACETAMINOPHEN 325 MG PO TABS: 650.0000 mg | ORAL_TABLET | ORAL | Status: DC | PRN

## 2022-04-24 MED ORDER — SODIUM CHLORIDE 0.9 % IV SOLN
250.0000 mL | INTRAVENOUS | Status: DC | PRN
  Administered 2022-04-24: 250 mL via INTRAVENOUS

## 2022-04-24 MED ORDER — SERTRALINE HCL 50 MG PO TABS
150.0000 mg | ORAL_TABLET | Freq: Every day | ORAL | Status: DC
  Administered 2022-04-25: 150 mg via ORAL
  Filled 2022-04-24: qty 1

## 2022-04-24 MED ORDER — PANTOPRAZOLE SODIUM 40 MG IV SOLR
40.0000 mg | Freq: Two times a day (BID) | INTRAVENOUS | Status: DC
  Administered 2022-04-24 – 2022-04-25 (×2): 40 mg via INTRAVENOUS
  Filled 2022-04-24 (×2): qty 10

## 2022-04-24 MED ORDER — LISINOPRIL 20 MG PO TABS
20.0000 mg | ORAL_TABLET | Freq: Every day | ORAL | Status: DC
  Administered 2022-04-25: 20 mg via ORAL
  Filled 2022-04-24: qty 1

## 2022-04-24 MED ORDER — SODIUM CHLORIDE 0.9% FLUSH: 3.0000 mL | INTRAVENOUS | Status: DC | PRN

## 2022-04-24 MED ORDER — FUROSEMIDE 10 MG/ML IJ SOLN
20.0000 mg | Freq: Every day | INTRAMUSCULAR | Status: DC
  Filled 2022-04-24: qty 2

## 2022-04-24 MED ORDER — LORATADINE 10 MG PO TABS: 10.0000 mg | ORAL_TABLET | Freq: Every day | ORAL | Status: DC | PRN

## 2022-04-24 NOTE — Progress Notes (Signed)
FOBT positive and iron study came back with iron saturation 3% indicating chronic GI bleed.  Start patient on PPI IV twice daily, placed a secure chat message to [facility] Dr. Henrene Pastor, will make patent NPO after midnight.

## 2022-04-24 NOTE — ED Notes (Signed)
Critical troponin of 359 communicated to Philip Aspen, MD at this time

## 2022-04-24 NOTE — H&P (Signed)
History and Physical    [name] States [id number] DOB: May 27, 1950 DOA: 04/24/2022  PCP: Aretta Nip, MD (Confirm with patient/family/NH records and if not entered, this has to be entered at [facility] point of entry) Patient coming from: Home  I have personally briefly reviewed patient's old medical records in [place]  Chief Complaint: SOB, fatigue  HPI: Debra Sharp is a 72 y.o. female with medical history significant of HTN, HLD, OA, came with worsening of exertional dyspnea.  3 weeks ago, patient had process of URI like symptoms with nasal congestion, runny nose, sore throat and cough which resolved in 1 week.  This week, patient started to develop exertional dyspnea, and progressively getting worse, now even walking short distance like around her house she started to feel significant shortness of breath.  Denies any chest pain, no cough, no fever or chills.  No abdominal pain, no change of stool color.  Denies any weight loss or night sweat.  Denied any numbness weakness of any of the limbs.  No history of blood related cancer right in the family.  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  ED Course: Afebrile, none tachycardia nonhypotensive saturating at 100% on room air.  Blood work showed hemoglobin 5.0, platelet 1548, WBC 14.8, creatinine 0.7, bicarb 24 glucose 128.  X-ray showed pulmonary congestion and bilateral small pleural effusion.  Hematology consulted, ordered a JAK2 study.  One dose of IV Lasix given in the ED.  Review of Systems: As per HPI otherwise 14 point review of systems negative.    No past medical history on file.  Past Surgical History:  Procedure Laterality Date   APPENDECTOMY     KNEE SURGERY       reports that she has quit smoking. She does not have any smokeless tobacco history on file. She reports that she does not drink alcohol and does not use drugs.  Allergies   Allergen Reactions   Aspirin     Asthma attack   Ibuprofen     REACTION: angioedema   Penicillins     REACTION: rash   Sulfonamide Derivatives     REACTION: facial swells    No family history on file.   Prior to Admission medications   Medication Sig Start Date End Date Taking? Authorizing Provider  cetirizine (ZYRTEC) 10 MG tablet Take 1 tablet (10 mg total) by mouth daily. 10/09/19   Sharion Balloon, FNP  fluticasone (FLONASE) 50 MCG/ACT nasal spray Place 2 sprays into both nostrils daily. 10/09/19   Sharion Balloon, FNP  lisinopril-hydrochlorothiazide (PRINZIDE,ZESTORETIC) 20-25 MG tablet Take 1 tablet by mouth daily.    [provider]  predniSONE (STERAPRED UNI-PAK 21 TAB) 10 MG (21) TBPK tablet 6 tabs for 1 day, then 5 tabs for 1 das, then 4 tabs for 1 day, then 3 tabs for 1 day, 2 tabs for 1 day, then 1 tab for 1 day 04/09/19   Loura Halt A, NP  sertraline (ZOLOFT) 100 MG tablet Take 150 mg by mouth daily.    [provider]    Physical Exam: Vitals:   04/24/22 1145 04/24/22 1200 04/24/22 1315 04/24/22 1330  BP: 126/67 (!) 133/59 139/70 123/84  Pulse: 92 92 100 97  Resp: '18 15 16 19  '$ Temp:      TempSrc:      SpO2: 99% 97% 96% 97%  Weight:      Height:  Constitutional: NAD, calm, comfortable Vitals:   04/24/22 1145 04/24/22 1200 04/24/22 1315 04/24/22 1330  BP: 126/67 (!) 133/59 139/70 123/84  Pulse: 92 92 100 97  Resp: '18 15 16 19  '$ Temp:      TempSrc:      SpO2: 99% 97% 96% 97%  Weight:      Height:       Eyes: PERRL, lids and conjunctivae normal ENMT: Mucous membranes are moist. Posterior pharynx clear of any exudate or lesions.Normal dentition.  Neck: normal, supple, no masses, no thyromegaly Respiratory: clear to auscultation bilaterally, no wheezing, fine crackles on bilateral lower fields normal respiratory effort. No accessory muscle use.  Cardiovascular: Regular rate and rhythm, no murmurs / rubs / gallops. No extremity  edema. 2+ pedal pulses. No carotid bruits.  Abdomen: no tenderness, no masses palpated. No hepatosplenomegaly. Bowel sounds positive.  Musculoskeletal: no clubbing / cyanosis. No joint deformity upper and lower extremities. Good ROM, no contractures. Normal muscle tone.  Skin: no rashes, lesions, ulcers. No induration Neurologic: CN 2-12 grossly intact. Sensation intact, DTR normal. Strength 5/5 in all 4.  Psychiatric: Normal judgment and insight. Alert and oriented x 3. Normal mood.     Labs on Admission: I have personally reviewed following labs and imaging studies  CBC: Recent Labs  Lab 04/24/22 1011  WBC 14.8*  NEUTROABS 14.1*  HGB 5.0*  HCT 17.3*  MCV 66.3*  PLT [id number]*   Basic Metabolic Panel: Recent Labs  Lab 04/24/22 1011  NA 136  K 3.7  CL 106  CO2 24  GLUCOSE 128*  BUN 12  CREATININE 0.75  CALCIUM 8.0*   GFR: Estimated Creatinine Clearance: 69.6 mL/min (by C-G formula based on SCr of 0.75 mg/dL). Liver Function Tests: No results for input(s): "AST", "ALT", "ALKPHOS", "BILITOT", "PROT", "ALBUMIN" in the last 168 hours. No results for input(s): "LIPASE", "AMYLASE" in the last 168 hours. No results for input(s): "AMMONIA" in the last 168 hours. Coagulation Profile: No results for input(s): "INR", "PROTIME" in the last 168 hours. Cardiac Enzymes: No results for input(s): "CKTOTAL", "CKMB", "CKMBINDEX", "TROPONINI" in the last 168 hours. BNP (last 3 results) No results for input(s): "PROBNP" in the last 8760 hours. HbA1C: No results for input(s): "HGBA1C" in the last 72 hours. CBG: No results for input(s): "GLUCAP" in the last 168 hours. Lipid Profile: No results for input(s): "CHOL", "HDL", "LDLCALC", "TRIG", "CHOLHDL", "LDLDIRECT" in the last 72 hours. Thyroid Function Tests: No results for input(s): "TSH", "T4TOTAL", "FREET4", "T3FREE", "THYROIDAB" in the last 72 hours. Anemia Panel: No results for input(s): "VITAMINB12", "FOLATE", "FERRITIN", "TIBC",  "IRON", "RETICCTPCT" in the last 72 hours. Urine analysis:    Component Value Date/Time   COLORURINE YELLOW 10/09/2009 2224   APPEARANCEUR CLEAR 10/09/2009 2224   LABSPEC 1.022 10/09/2009 2224   PHURINE 6.5 10/09/2009 2224   GLUCOSEU NEGATIVE 10/09/2009 2224   HGBUR NEGATIVE 10/09/2009 2224   HGBUR negative 11/30/2008 1120   [place] 10/09/2009 2224   KETONESUR 15 (A) 10/09/2009 2224   PROTEINUR NEGATIVE 10/09/2009 2224   UROBILINOGEN 1.0 10/09/2009 2224   NITRITE NEGATIVE 10/09/2009 2224   LEUKOCYTESUR SMALL (A) 10/09/2009 2224    Radiological Exams on Admission: DG Chest Port 1 View  Result Date: 04/24/2022 CLINICAL DATA:  Shortness of breath EXAM: PORTABLE CHEST 1 VIEW COMPARISON:  05/11/2015 FINDINGS: Blunted costophrenic angles compatible with pleural effusions. Associated passive atelectasis. Mild enlargement of the cardiopericardial silhouette the. Mild upper zone pulmonary vascular prominence raising the possibility of pulmonary venous  hypertension, but without overt edema. IMPRESSION: 1. Small bilateral pleural effusions with associated passive atelectasis. 2. Mild enlargement of the cardiopericardial silhouette with pulmonary venous hypertension but no overt edema. Electronically Signed   By: Van Clines M.D.   On: 04/24/2022 10:27    EKG: Independently reviewed.  Sinus tachycardia, nonspecific ST changes on multiple leads.  Assessment/Plan Principal Problem:   CHF (congestive heart failure) (HCC) Active Problems:   CHF (congestive heart failure), NYHA class I, acute, diastolic (HCC)   Normocytic anemia   Thrombocytosis   Elevated troponin  (please populate well all problems here in Problem List. (For example, if patient is on BP meds at home and you resume or decide to hold them, it is a problem that needs to be her. Same for CAD, COPD, HLD and so on)  Acute symptomatic anemia -Acute, microcytic -Needs further differentials, to rule out GI bleed,  FOBT sent, iron study pending.  For bone marrow related issue and hemolytic etiology, reticulocyte count LDH and JAK2 ordered by hematology. -Hematology approved 2 unit of PRBC transfusion  Acute CHF decompensation, likely HFpEF -Secondary to decompensated acute anemia -Received IV Lasix x 1 in the ED, continue IV Lasix 20 mg daily starting tomorrow -Hold off HCTZ while patient on Lasix and continue ACEI -Echocardiogram  Elevated troponins -Trending of troponin level is flat, indicating demanding ischemia from decompensated anemia.  ACS unlikely.  Correct anemia then reevaluate. -Echocardiogram ordered, if there is no significant focal wall motion abnormalities, likely can be followed outpatient cardiology for outpatient stress test. -TSH, UA  Thrombocytosis -Probably primary changes, rule out myeloproliferative disorder, hematology on board -No symptoms signs of thromboembolic event such as TIA stroke or PE. -Patient allergic to aspirin, will discuss with hematology regarding antiplatelet options.  Leukocytosis -Probably reactive to thrombocytosis and acute anemia.  Breathing symptoms appears to related to symptomatic anemia and CHF, and no other symptoms signs of active infection, no indication for antibiotics.  HTN -Hold of HCTZ -Continue ACEI  Anxiety/depression, hyperlipidemia -Stable, on SSRI.  DVT prophylaxis: Lovenox Code Status: Full code Family Communication: Waiting for hematology input Disposition Plan: Patient sick with decompensated anemia requiring inpatient transfusion and hematology teams were requiring inpatient hematology consultation, expect more than 2 midnight hospital stay. Consults called: Hematology oncology Dr. Lindi Adie Admission status: Tele admit   Lequita Halt MD [facility] Pager [phone]  04/24/2022, 1:49 PM

## 2022-04-24 NOTE — ED Notes (Signed)
ED TO INPATIENT HANDOFF REPORT  ED Nurse Name and Phone #: [phone]  S Name/Age/Gender Debra Sharp 72 y.o. female Room/Bed: 039C/039C  Code Status   Code Status: Full Code  Home/SNF/Other Home Patient oriented to: self, place, time, and situation Is this baseline? Yes   Triage Complete: Triage complete  Chief Complaint CHF (congestive heart failure) ([place]) [I50.9]  Triage Note Pt reports flu right after Valentines Day. Symptoms all resolved within a week with the exception of fatigue remaining since then. New DOE x 3 days. Unable to walk any distance without feeling sob. Denies chest pain.    Allergies Allergies  Allergen Reactions   Aspirin Other (See Comments)    Caused an asthma attack   Ibuprofen Swelling and Other (See Comments)    Angioedema and Asthma exacerbation   Sulfonamide Derivatives Swelling and Other (See Comments)    Face swelled SEVERELY   Penicillins Rash    Level of Care/Admitting Diagnosis ED Disposition     ED Disposition  Admit   Condition  --   Comment  Hospital Area: [facility] [[id number]]  Level of Care: Telemetry Medical [104]  May admit patient to [facility] or [facility] if equivalent level of care is available:: No  Covid Evaluation: Asymptomatic - no recent exposure (last 10 days) testing not required  Diagnosis: [facility]congestive heart failure) [id number]  Admitting Physician: Lequita Halt [id number]  Attending Physician: Lequita Halt [id number]  Certification:: I certify this patient will need inpatient services for at least 2 midnights  Estimated Length of Stay: 2          B Medical/Surgery History No past medical history on file. Past Surgical History:  Procedure Laterality Date   APPENDECTOMY     KNEE SURGERY       A IV Location/Drains/Wounds Patient Lines/Drains/Airways Status     Active Line/Drains/Airways     Name Placement date Placement time Site Days   Peripheral IV 04/24/22 20  G Right Antecubital 04/24/22  1010  Antecubital  less than 1   Peripheral IV 04/24/22 20 G Posterior;Right Forearm 04/24/22  1330  Forearm  less than 1            Intake/Output Last 24 hours No intake or output data in the 24 hours ending 04/24/22 1715  Labs/Imaging Results for orders placed or performed during the hospital encounter of 04/24/22 (from the past 48 hour(s))  Resp panel by RT-PCR (RSV, Flu A&B, Covid) Anterior Nasal Swab     Status: None   Collection Time: 04/24/22 10:02 AM   Specimen: Anterior Nasal Swab  Result Value Ref Range   SARS Coronavirus 2 by RT PCR NEGATIVE NEGATIVE   Influenza A by PCR NEGATIVE NEGATIVE   Influenza B by PCR NEGATIVE NEGATIVE    Comment: (NOTE) The Xpert Xpress SARS-CoV-2/FLU/RSV plus assay is intended as an aid in the diagnosis of influenza from Nasopharyngeal swab specimens and should not be used as a sole basis for treatment. Nasal washings and aspirates are unacceptable for Xpert Xpress SARS-CoV-2/FLU/RSV testing.  Fact Sheet for Patients: EntrepreneurPulse.com.au  Fact Sheet for Healthcare Providers: IncredibleEmployment.be  This test is not yet approved or cleared by the [place] FDA and has been authorized for detection and/or diagnosis of SARS-CoV-2 by FDA under an Emergency Use Authorization (EUA). This EUA will remain in effect (meaning this test can be used) for the duration of the COVID-19 declaration under Section 564(b)(1) of the Act, 21 U.S.C. section  360bbb-3(b)(1), unless the authorization is terminated or revoked.     Resp Syncytial Virus by PCR NEGATIVE NEGATIVE    Comment: (NOTE) Fact Sheet for Patients: EntrepreneurPulse.com.au  Fact Sheet for Healthcare Providers: IncredibleEmployment.be  This test is not yet approved or cleared by the [place] FDA and has been authorized for detection and/or diagnosis of SARS-CoV-2 by FDA  under an Emergency Use Authorization (EUA). This EUA will remain in effect (meaning this test can be used) for the duration of the COVID-19 declaration under Section 564(b)(1) of the Act, 21 U.S.C. section 360bbb-3(b)(1), unless the authorization is terminated or revoked.  Performed at [facility], [place] [address] [place], [place] [address]   Brain natriuretic peptide     Status: Abnormal   Collection Time: 04/24/22 10:02 AM  Result Value Ref Range   B Natriuretic Peptide 955.2 (H) 0.0 - 100.0 pg/mL    Comment: Performed at [facility] [address] [place] [address]  CBC     Status: Abnormal   Collection Time: 04/24/22 10:11 AM  Result Value Ref Range   WBC 14.8 (H) 4.0 - 10.5 K/uL   RBC 2.61 (L) 3.87 - 5.11 MIL/uL   Hemoglobin 5.0 (LL) 12.0 - 15.0 g/dL    Comment: REPEATED TO VERIFY CRITICAL RESULT CALLED TO, READ BACK BY AND VERIFIED WITH: CRITICAL CALLED TO Jolene Provost, RN 1155 04/24/2022 BY SHERRY GALLOWAY.    HCT 17.3 (L) 36.0 - 46.0 %   MCV 66.3 (L) 80.0 - 100.0 fL   MCH 66.3 (H) 26.0 - 34.0 pg   MCHC 28.9 (L) 30.0 - 36.0 g/dL   RDW 20.6 (H) 11.5 - 15.5 %   Platelets 1,548 (HH) 150 - 400 K/uL    Comment: REPEATED TO VERIFY CRITICAL RESULT CALLED TO, READ BACK BY AND VERIFIED WITH: PLATELET COUNT CONFIRMED BY Lucianne Muss, RN 1155 04/24/2022 BY SHERRY GALLOWAY Performed at [facility], [place] [address], [place], [place] [id number]   Basic metabolic panel     Status: Abnormal   Collection Time: 04/24/22 10:11 AM  Result Value Ref Range   Sodium 136 135 - 145 mmol/L   Potassium 3.7 3.5 - 5.1 mmol/L   Chloride 106 98 - 111 mmol/L   CO2 24 22 - 32 mmol/L   Glucose, Bld 128 (H) 70 - 99 mg/dL    Comment: Glucose reference range applies only to samples taken after fasting for at least 8 hours.   BUN 12 8 - 23 mg/dL   Creatinine, Ser 0.75 0.44 - 1.00 mg/dL   Calcium 8.0 (L) 8.9 - 10.3 mg/dL   GFR, Estimated >60 >60 mL/min    Comment:  (NOTE) Calculated using the CKD-EPI Creatinine Equation (2021)    Anion gap 6 5 - 15    Comment: Performed at [facility] [address] [place], [place] [address]  D-dimer, quantitative     Status: Abnormal   Collection Time: 04/24/22 10:11 AM  Result Value Ref Range   D-Dimer, Quant 0.61 (H) 0.00 - 0.50 ug/mL-FEU    Comment: (NOTE) At the manufacturer cut-off value of 0.5 g/mL FEU, this assay has a negative predictive value of 95-100%.This assay is intended for use in conjunction with a clinical pretest probability (PTP) assessment model to exclude pulmonary embolism (PE) and deep venous thrombosis (DVT) in outpatients suspected of PE or DVT. Results should be correlated with clinical presentation. Performed at [facility], [place]  [address], [place], [place] [address]   Troponin I (High Sensitivity)     Status: Abnormal   Collection Time: 04/24/22 10:11 AM  Result Value Ref Range   Troponin I (High Sensitivity) 359 (HH) <18 ng/L    Comment: CRITICAL RESULT CALLED TO, READ BACK BY AND VERIFIED WITH Maxine Glenn, RN 1149 04/24/22 L. KLAR (NOTE) Elevated high sensitivity troponin I (hsTnI) values and significant  changes across serial measurements may suggest ACS but many other  chronic and acute conditions are known to elevate hsTnI results.  Refer to the "Links" section for chest pain algorithms and additional  guidance. Performed at [facility], [place] [address], [place], [place] [address]   Differential     Status: Abnormal   Collection Time: 04/24/22 10:11 AM  Result Value Ref Range   Neutrophils Relative % 90 %   Neutro Abs 14.1 (H) 1.7 - 7.7 K/uL   Band Neutrophils 0 %   Lymphocytes Relative 5 %   Lymphs Abs 0.7 0.7 - 4.0 K/uL   Monocytes Relative 3 %   Monocytes Absolute 0.4 0.1 - 1.0 K/uL   Eosinophils Relative 0 %   Eosinophils Absolute 0.0 0.0 - 0.5 K/uL   Basophils Relative 2 %   Basophils Absolute 0.3 (H) 0.0 - 0.1 K/uL   WBC Morphology  MORPHOLOGY UNREMARKABLE    RBC Morphology TARGET CELLS     Comment: TEARDROP CELLS POLYCHROMASIA PRESENT    Smear Review SEE NOTE FOR PLATELETS.     Comment: Performed at [facility], [place] [address], [place], [place] [address]  ABO/Rh     Status: None   Collection Time: 04/24/22 10:11 AM  Result Value Ref Range   ABO/RH(D)      A POS Performed at [facility] [address], [place], [place] [address]   Troponin I (High Sensitivity)     Status: Abnormal   Collection Time: 04/24/22 12:05 PM  Result Value Ref Range   Troponin I (High Sensitivity) 392 (HH) <18 ng/L    Comment: CRITICAL VALUE NOTED. VALUE IS CONSISTENT WITH PREVIOUSLY REPORTED/CALLED VALUE (NOTE) Elevated high sensitivity troponin I (hsTnI) values and significant  changes across serial measurements may suggest ACS but many other  chronic and acute conditions are known to elevate hsTnI results.  Refer to the "Links" section for chest pain algorithms and additional  guidance. Performed at [facility], [place] [address], [place], [place] [address]   Reticulocytes     Status: Abnormal   Collection Time: 04/24/22 12:48 PM  Result Value Ref Range   Retic Ct Pct 2.3 0.4 - 3.1 %   RBC. 2.62 (L) 3.87 - 5.11 MIL/uL   Retic Count, Absolute 60.0 19.0 - 186.0 K/uL   Immature Retic Fract 31.1 (H) 2.3 - 15.9 %    Comment: Performed at [facility] [address], [place], [place] [address]  TSH     Status: None   Collection Time: 04/24/22 12:48 PM  Result Value Ref Range   TSH 1.114 0.350 - 4.500 uIU/mL    Comment: Performed by a 3rd Generation assay with a functional sensitivity of <=0.01 uIU/mL. Performed at [facility], [place] [address] [place], [place] [address]   Iron and TIBC     Status: Abnormal   Collection Time: 04/24/22  1:17 PM  Result Value Ref Range   Iron 9 (L) 28 - 170 ug/dL   TIBC 314 250 - 450  ug/dL   Saturation Ratios 3 (L) 10.4 - 31.8 %   UIBC 305 ug/dL    Comment:  Performed at [facility], [place] [address] [place], [place] [address]  Ferritin (Iron Binding Protein)     Status: None   Collection Time: 04/24/22  1:17 PM  Result Value Ref Range   Ferritin 15 11 - 307 ng/mL    Comment: Performed at [facility], [place] [address] [place], [place] [address]  Lactate dehydrogenase     Status: None   Collection Time: 04/24/22  1:17 PM  Result Value Ref Range   LDH 165 98 - 192 U/L    Comment: Performed at [facility], [place] [address], [place], [place] [address]  Type and screen [place]     Status: None (Preliminary result)   Collection Time: 04/24/22  1:17 PM  Result Value Ref Range   ABO/RH(D) A POS    Antibody Screen NEG    Sample Expiration 04/27/2022,2359    Unit Number [id number]    Blood Component Type RED CELLS,LR    Unit division 00    Status of Unit ALLOCATED    Transfusion Status OK TO TRANSFUSE    Crossmatch Result Compatible    Unit Number [id number]    Blood Component Type RED CELLS,LR    Unit division 00    Status of Unit ISSUED    Transfusion Status OK TO TRANSFUSE    Crossmatch Result      Compatible Performed at [facility], [place] [address], [place], [place] [address]   Prepare RBC (crossmatch)     Status: None   Collection Time: 04/24/22  1:17 PM  Result Value Ref Range   Order Confirmation      ORDER PROCESSED BY BLOOD BANK Performed at [facility], [place] [address] [place], [place] [address]   Technologist smear review     Status: None   Collection Time: 04/24/22  1:17 PM  Result Value Ref Range   WBC MORPHOLOGY Abnormal lymphocytes present    RBC MORPHOLOGY Schistocytes present     Comment: ELLIPTOCYTES   Plt Morphology MORPHOLOGY UNREMARKABLE    Clinical Information anemia     Comment: Performed at [facility], [place] [address], [place], [place] [address]  HIV Antibody (routine testing w rflx)     Status: None   Collection Time: 04/24/22  1:17 PM   Result Value Ref Range   HIV Screen 4th Generation wRfx Non Reactive Non Reactive    Comment: Performed at [facility], [place] [address] [place], [place] [address]  Hepatic function panel     Status: Abnormal   Collection Time: 04/24/22  1:17 PM  Result Value Ref Range   Total Protein 5.8 (L) 6.5 - 8.1 g/dL   Albumin 2.2 (L) 3.5 - 5.0 g/dL   AST 17 15 - 41 U/L   ALT 10 0 - 44 U/L   Alkaline Phosphatase 96 38 - 126 U/L   Total Bilirubin 0.4 0.3 - 1.2 mg/dL   Bilirubin, Direct <0.1 0.0 - 0.2 mg/dL   Indirect Bilirubin NOT CALCULATED 0.3 - 0.9 mg/dL    Comment: Performed at [facility] [address] [place], [place] [address]  POC occult blood, ED     Status: Abnormal   Collection Time: 04/24/22  1:32 PM  Result Value Ref Range   Fecal Occult Bld POSITIVE (A) NEGATIVE  DG Chest Port 1 View  Result Date: 04/24/2022 CLINICAL DATA:  Shortness of breath EXAM: PORTABLE CHEST 1 VIEW COMPARISON:  05/11/2015 FINDINGS: Blunted costophrenic angles compatible with pleural effusions. Associated passive atelectasis. Mild enlargement of the cardiopericardial silhouette the. Mild upper zone pulmonary vascular prominence raising the possibility of pulmonary venous hypertension, but without overt edema. IMPRESSION: 1. Small bilateral pleural effusions with associated passive atelectasis. 2. Mild enlargement of the cardiopericardial silhouette with pulmonary venous hypertension but no overt edema. Electronically Signed   By: Van Clines M.D.   On: 04/24/2022 10:27    Pending Labs Unresulted Labs (From admission, onward)     Start     Ordered   04/25/22 [id number]  Basic metabolic panel  Daily,   R     Comments: As Scheduled for 5 days    04/24/22 1348   04/24/22 1406  Urinalysis, Routine w reflex microscopic -Urine, Clean Catch  Once,   R       Question:  Specimen Source  Answer:  Urine, Clean Catch   04/24/22 1405   04/24/22 1317  Pathologist smear review  Once,   R        04/24/22  1317   04/24/22 1313  JAK2 [id number] rfx CALR/MPL/E12-15  Once,   URGENT        04/24/22 1312   04/24/22 1248  Haptoglobin  Once,   URGENT        04/24/22 1248            Vitals/Pain Today's Vitals   04/24/22 1430 04/24/22 1630 04/24/22 1634 04/24/22 1645  BP: (!) 149/72 (!) 119/58  (!) 115/58  Pulse: (!) 101 84  84  Resp: 20 16  (!) 21  Temp:  99.5 F (37.5 C)  98.3 F (36.8 C)  TempSrc:   Oral Oral  SpO2: 98% 96%  97%  Weight:      Height:      PainSc:  0-No pain      Isolation Precautions No active isolations  Medications Medications  furosemide (LASIX) injection 20 mg (has no administration in time range)  lisinopril (ZESTRIL) tablet 20 mg (has no administration in time range)  sertraline (ZOLOFT) tablet 150 mg (150 mg Oral Not Given 04/24/22 1455)  sodium chloride flush (NS) 0.9 % injection 3 mL (3 mLs Intravenous Given 04/24/22 1405)  sodium chloride flush (NS) 0.9 % injection 3 mL (has no administration in time range)  0.9 %  sodium chloride infusion (250 mLs Intravenous New Bag/Given 04/24/22 1543)  acetaminophen (TYLENOL) tablet 650 mg (has no administration in time range)  ondansetron (ZOFRAN) injection 4 mg (has no administration in time range)  enoxaparin (LOVENOX) injection 40 mg (40 mg Subcutaneous Given 04/24/22 1405)  loratadine (CLARITIN) tablet 10 mg (has no administration in time range)  ipratropium-albuterol (DUONEB) 0.5-2.5 (3) MG/3ML nebulizer solution 3 mL (3 mLs Nebulization Given 04/24/22 1023)  furosemide (LASIX) injection 60 mg (60 mg Intravenous Given 04/24/22 1404)    Mobility walks with person assist     Focused Assessments Pulmonary Assessment Handoff:  Lung sounds: Bilateral Breath Sounds: Diminished O2 Device: Room Air      R Recommendations: See Admitting Provider Note  Report given to:   Additional Notes: Pt hemoglobin was 5.0 she suppose to get 2 units. The first bag of blood is running now.

## 2022-04-24 NOTE — ED Provider Notes (Signed)
[facility] Provider Note   CSN: [id number] Arrival date & time: 04/24/22  [id number]     History  Chief Complaint  Patient presents with   Shortness of Breath    Debra Sharp is a 72 y.o. female.  72 year old female with a history of hypertension, and asthma who presents to the emergency department with shortness of breath.  Patient reports that near Valentine's Day she was diagnosed with a URI.  Did not actually have a positive flu test but was treated presumptively for this.  Reports that she recovered aside from fatigue and shortness of breath that developed 3 days ago.  Says that it now limits her ability to walk around her house because she is so short of breath.  Occasionally feels palpitations as well but has not been diagnosed with atrial fibrillation in the past.  Denies any significant cough.  No chest pain or lower extremity swelling.  No history of DVT or PE.  Not currently on blood thinners.  Says that her inhaler helps occasionally.       Home Medications Prior to Admission medications   Medication Sig Start Date End Date Taking? Authorizing Provider  cetirizine (ZYRTEC) 10 MG tablet Take 1 tablet (10 mg total) by mouth daily. Patient taking differently: Take 10 mg by mouth daily as needed for allergies or rhinitis. 10/09/19  Yes Hawks, Christy A, FNP  fluticasone (FLONASE) 50 MCG/ACT nasal spray Place 2 sprays into both nostrils daily. Patient taking differently: Place 2 sprays into both nostrils at bedtime as needed for allergies or rhinitis. 10/09/19  Yes Hawks, Christy A, FNP  lisinopril (ZESTRIL) 40 MG tablet Take 40 mg by mouth in the morning.   Yes [provider]  lovastatin (MEVACOR) 20 MG tablet Take 20 mg by mouth at bedtime.   Yes [provider]  PROAIR HFA 108 (90 Base) MCG/ACT inhaler Inhale 2 puffs into the lungs every 6 (six) hours as needed for wheezing or shortness of breath.   Yes [provider]  sertraline (ZOLOFT) 100 MG tablet Take 150 mg by mouth in the morning.   Yes [provider]  SYMBICORT 160-4.5 MCG/ACT inhaler Inhale 2 puffs into the lungs in the morning and at bedtime.   Yes [provider]  predniSONE (STERAPRED UNI-PAK 21 TAB) 10 MG (21) TBPK tablet 6 tabs for 1 day, then 5 tabs for 1 das, then 4 tabs for 1 day, then 3 tabs for 1 day, 2 tabs for 1 day, then 1 tab for 1 day Patient not taking: Reported on 04/24/2022 04/09/19   Loura Halt A, NP      Allergies    Aspirin, Ibuprofen, Sulfonamide derivatives, and Penicillins    Review of Systems   Review of Systems  Physical Exam Updated Vital Signs BP (!) 115/58   Pulse 84   Temp 98.3 F (36.8 C) (Oral)   Resp (!) 21   Ht '5\' 6"'$  (1.676 m)   Wt 77.1 kg   SpO2 97%   BMI 27.44 kg/m  Physical Exam Vitals and nursing note reviewed.  Constitutional:      General: She is not in acute distress.    Appearance: She is well-developed.  HENT:     Head: Normocephalic and atraumatic.     Right Ear: External ear normal.     Left Ear: External ear normal.     Nose: Nose normal.  Eyes:     Extraocular Movements: Extraocular  movements intact.     Conjunctiva/sclera: Conjunctivae normal.     Pupils: Pupils are equal, round, and reactive to light.  Cardiovascular:     Rate and Rhythm: Normal rate and regular rhythm.     Heart sounds: No murmur heard.    Comments: No atrial fibrillation or arrhythmia noted on the monitor Pulmonary:     Effort: Pulmonary effort is normal. No respiratory distress.     Breath sounds: Normal breath sounds.  Abdominal:     General: Abdomen is flat. There is no distension.     Palpations: Abdomen is soft. There is no mass.     Tenderness: There is no abdominal tenderness. There is no guarding.  Musculoskeletal:     Cervical back: Normal range of motion and neck supple.     Right lower leg: No edema.     Left lower leg: No edema.  Skin:    General: Skin is  warm and dry.  Neurological:     Mental Status: She is alert and oriented to person, place, and time. Mental status is at baseline.  Psychiatric:        Mood and Affect: Mood normal.     ED Results / Procedures / Treatments   Labs (all labs ordered are listed, but only abnormal results are displayed) Labs Reviewed  CBC - Abnormal; Notable for the following components:      Result Value   WBC 14.8 (*)    RBC 2.61 (*)    Hemoglobin 5.0 (*)    HCT 17.3 (*)    MCV 66.3 (*)    MCH 66.3 (*)    MCHC 28.9 (*)    RDW 20.6 (*)    Platelets 1,548 (*)    All other components within normal limits  BASIC METABOLIC PANEL - Abnormal; Notable for the following components:   Glucose, Bld 128 (*)    Calcium 8.0 (*)    All other components within normal limits  D-DIMER, QUANTITATIVE - Abnormal; Notable for the following components:   D-Dimer, Quant 0.61 (*)    All other components within normal limits  BRAIN NATRIURETIC PEPTIDE - Abnormal; Notable for the following components:   B Natriuretic Peptide 955.2 (*)    All other components within normal limits  DIFFERENTIAL - Abnormal; Notable for the following components:   Neutro Abs 14.1 (*)    Basophils Absolute 0.3 (*)    All other components within normal limits  IRON AND TIBC - Abnormal; Notable for the following components:   Iron 9 (*)    Saturation Ratios 3 (*)    All other components within normal limits  RETICULOCYTES - Abnormal; Notable for the following components:   RBC. 2.62 (*)    Immature Retic Fract 31.1 (*)    All other components within normal limits  HEPATIC FUNCTION PANEL - Abnormal; Notable for the following components:   Total Protein 5.8 (*)    Albumin 2.2 (*)    All other components within normal limits  POC OCCULT BLOOD, ED - Abnormal; Notable for the following components:   Fecal Occult Bld POSITIVE (*)    All other components within normal limits  TROPONIN I (HIGH SENSITIVITY) - Abnormal; Notable for the  following components:   Troponin I (High Sensitivity) 359 (*)    All other components within normal limits  TROPONIN I (HIGH SENSITIVITY) - Abnormal; Notable for the following components:   Troponin I (High Sensitivity) 392 (*)    All other components  within normal limits  RESP PANEL BY RT-PCR (RSV, FLU A&B, COVID)  RVPGX2  FERRITIN  LACTATE DEHYDROGENASE  TECHNOLOGIST SMEAR REVIEW  HIV ANTIBODY (ROUTINE TESTING W REFLEX)  TSH  HAPTOGLOBIN  JAK2 V617F RFX CALR/MPL/E12-15  URINALYSIS, ROUTINE W REFLEX MICROSCOPIC  PATHOLOGIST SMEAR REVIEW  BASIC METABOLIC PANEL  TYPE AND SCREEN  ABO/RH  PREPARE RBC (CROSSMATCH)    EKG EKG Interpretation  Date/Time:  Friday April 24 2022 09:43:40 EST Ventricular Rate:  102 PR Interval:  130 QRS Duration: 92 QT Interval:  299 QTC Calculation: 390 R Axis:   98 Text Interpretation: Sinus tachycardia Right axis deviation Nonspecific repol abnormality, diffuse leads Confirmed by Margaretmary Eddy [id number]) on 04/24/2022 10:47:18 AM  Radiology DG Chest Port 1 View  Result Date: 04/24/2022 CLINICAL DATA:  Shortness of breath EXAM: PORTABLE CHEST 1 VIEW COMPARISON:  05/11/2015 FINDINGS: Blunted costophrenic angles compatible with pleural effusions. Associated passive atelectasis. Mild enlargement of the cardiopericardial silhouette the. Mild upper zone pulmonary vascular prominence raising the possibility of pulmonary venous hypertension, but without overt edema. IMPRESSION: 1. Small bilateral pleural effusions with associated passive atelectasis. 2. Mild enlargement of the cardiopericardial silhouette with pulmonary venous hypertension but no overt edema. Electronically Signed   By: Van Clines M.D.   On: 04/24/2022 10:27    Procedures Procedures   Medications Ordered in ED Medications  furosemide (LASIX) injection 20 mg (has no administration in time range)  lisinopril (ZESTRIL) tablet 20 mg (has no administration in time range)  sertraline  (ZOLOFT) tablet 150 mg (150 mg Oral Not Given 04/24/22 1455)  sodium chloride flush (NS) 0.9 % injection 3 mL (3 mLs Intravenous Given 04/24/22 1405)  sodium chloride flush (NS) 0.9 % injection 3 mL (has no administration in time range)  0.9 %  sodium chloride infusion (250 mLs Intravenous New Bag/Given 04/24/22 1543)  acetaminophen (TYLENOL) tablet 650 mg (has no administration in time range)  ondansetron (ZOFRAN) injection 4 mg (has no administration in time range)  enoxaparin (LOVENOX) injection 40 mg (40 mg Subcutaneous Given 04/24/22 1405)  loratadine (CLARITIN) tablet 10 mg (has no administration in time range)  ipratropium-albuterol (DUONEB) 0.5-2.5 (3) MG/3ML nebulizer solution 3 mL (3 mLs Nebulization Given 04/24/22 1023)  furosemide (LASIX) injection 60 mg (60 mg Intravenous Given 04/24/22 1404)    ED Course/ Medical Decision Making/ A&P Clinical Course as of 04/24/22 1746  Fri Apr 24, 2022  1050 D-Dimer, Quant(!): 0.61 Within age adjusted limits.  [RP]  [id number] Chest x-ray with pleural effusions and enlarged cardiac silhouette. [RP]  13[address] Dr [address]ocks [RP]    Clinical Course User Index [RP] Fransico Meadow, MD                             Medical Decision Making Amount and/or Complexity of Data Reviewed Labs: ordered. Decision-making details documented in ED Course. Radiology: ordered.  Risk Prescription drug management. Decision regarding hospitalization.   Debra Sharp is a 72 y.o. female with comorbidities that complicate the patient evaluation including 72 year old female with a history of hypertension and asthma who presents to the emergency department with shortness of breath and palpitations with clear lung sounds  Initial Ddx:  MI, PE, pneumothorax, pericardial effusion, respiratory infection, heart failure exacerbation, asthma exacerbation  MDM:  Unclear what is causing the patient's symptoms at this time.  Does not have significant wheezing or rails or lower extremity  edema that would suggest COPD or heart failure exacerbation.  May potentially have a pneumonia or URI but denies any fevers or cough recently.  Will obtain troponins and D-dimer to evaluate for MI and pulmonary embolism.  Considered pericardial effusion and if there are any concerning findings on imaging may consider point-of-care ultrasound to evaluate.  Will also trial nebulizer to see if this helps the patient's breathing.  Plan:  Labs Troponin D-dimer BNP COVID and flu Chest x-ray EKG  ED Summary/Re-evaluation:  On reassessment remained stable in the emergency department.  Was found to have low hemoglobin with elevated white blood cell count and thrombocytosis that was significant.  Unclear the source of her anemia but Hemoccult was sent and is pending at this time and did have dark brown stool on exam.  Also sent iron studies and patient was ordered for 2 units of blood after discussing with hematology and adding additional labs to further evaluate the source of her anemia.  Was found to have elevated troponin as well as BNP with pleural effusions on the chest x-ray and I suspect that she has developed heart failure.  Unclear if this could be related to her anemia or not.  Withholding on antiplatelets and anticoagulation in case of GI bleed and suspect that her elevated troponin is demand due to her anemia rather than ACS.  Patient admitted to medicine with Hemoccult pending and awaiting transfusions.  This patient presents to the ED for concern of complaints listed in HPI, this involves an extensive number of treatment options, and is a complaint that carries with it a high risk of complications and morbidity. Disposition including potential need for admission considered.   Dispo: Admit to Floor  Additional history obtained from family Records reviewed Outpatient Clinic Notes The following labs were independently interpreted: CBC and show acute anemia I independently reviewed the  following imaging with scope of interpretation limited to determining acute life threatening conditions related to emergency care: Chest x-ray and agree with the radiologist interpretation with the following exceptions: None I personally reviewed and interpreted cardiac monitoring: normal sinus rhythm  I personally reviewed and interpreted the pt's EKG: see above for interpretation  I have reviewed the patients home medications and made adjustments as needed Consults: Hospitalist and hematology  Final Clinical Impression(s) / ED Diagnoses Final diagnoses:  Symptomatic anemia  Heart failure, unspecified HF chronicity, unspecified heart failure type ([place])  SOB (shortness of breath)    Rx / DC Orders ED Discharge Orders     None      CRITICAL CARE Performed by: Fransico Meadow   Total critical care time: 30 minutes  Critical care time was exclusive of separately billable procedures and treating other patients.  Critical care was necessary to treat or prevent imminent or life-threatening deterioration.  Critical care was time spent personally by me on the following activities: development of treatment plan with patient and/or surrogate as well as nursing, discussions with consultants, evaluation of patient's response to treatment, examination of patient, obtaining history from patient or surrogate, ordering and performing treatments and interventions, ordering and review of laboratory studies, ordering and review of radiographic studies, pulse oximetry and re-evaluation of patient's condition.    Fransico Meadow, MD 04/24/22 [phone]

## 2022-04-24 NOTE — ED Triage Notes (Signed)
Pt reports flu right after Valentines Day. Symptoms all resolved within a week with the exception of fatigue remaining since then. New DOE x 3 days. Unable to walk any distance without feeling sob. Denies chest pain.

## 2022-04-24 NOTE — ED Notes (Signed)
Blood consent signed and at bedside 

## 2022-04-25 ENCOUNTER — Inpatient Hospital Stay (HOSPITAL_COMMUNITY): Payer: Medicare Other

## 2022-04-25 DIAGNOSIS — I5031 Acute diastolic (congestive) heart failure: Secondary | ICD-10-CM

## 2022-04-25 DIAGNOSIS — I509 Heart failure, unspecified: Secondary | ICD-10-CM

## 2022-04-25 DIAGNOSIS — I10 Essential (primary) hypertension: Secondary | ICD-10-CM

## 2022-04-25 DIAGNOSIS — D509 Iron deficiency anemia, unspecified: Secondary | ICD-10-CM | POA: Insufficient documentation

## 2022-04-25 DIAGNOSIS — D649 Anemia, unspecified: Secondary | ICD-10-CM

## 2022-04-25 DIAGNOSIS — R040 Epistaxis: Secondary | ICD-10-CM | POA: Diagnosis present

## 2022-04-25 DIAGNOSIS — J4489 Other specified chronic obstructive pulmonary disease: Secondary | ICD-10-CM

## 2022-04-25 LAB — URINALYSIS, ROUTINE W REFLEX MICROSCOPIC
Bilirubin Urine: NEGATIVE
Glucose, UA: NEGATIVE mg/dL
Hgb urine dipstick: NEGATIVE
Ketones, ur: NEGATIVE mg/dL
Leukocytes,Ua: NEGATIVE
Nitrite: NEGATIVE
Protein, ur: NEGATIVE mg/dL
Specific Gravity, Urine: 1.011 (ref 1.005–1.030)
pH: 6 (ref 5.0–8.0)

## 2022-04-25 LAB — ECHOCARDIOGRAM COMPLETE
AR max vel: 1.97 cm2
AV Area VTI: 2 cm2
AV Area mean vel: 2.01 cm2
AV Mean grad: 4 mmHg
AV Peak grad: 7.9 mmHg
Ao pk vel: 1.41 m/s
Area-P 1/2: 3.2 cm2
Height: 66 in
S' Lateral: 4.2 cm
Weight: 2754.87 oz

## 2022-04-25 LAB — CBC
HCT: 24.4 % — ABNORMAL LOW (ref 36.0–46.0)
Hemoglobin: 7.8 g/dL — ABNORMAL LOW (ref 12.0–15.0)
MCH: 22.1 pg — ABNORMAL LOW (ref 26.0–34.0)
MCHC: 32 g/dL (ref 30.0–36.0)
MCV: 69.1 fL — ABNORMAL LOW (ref 80.0–100.0)
Platelets: 1282 10*3/uL (ref 150–400)
RBC: 3.53 MIL/uL — ABNORMAL LOW (ref 3.87–5.11)
RDW: 22.3 % — ABNORMAL HIGH (ref 11.5–15.5)
WBC: 16.2 10*3/uL — ABNORMAL HIGH (ref 4.0–10.5)
nRBC: 0.2 % (ref 0.0–0.2)

## 2022-04-25 LAB — BASIC METABOLIC PANEL
Anion gap: 12 (ref 5–15)
BUN: 11 mg/dL (ref 8–23)
CO2: 23 mmol/L (ref 22–32)
Calcium: 8.4 mg/dL — ABNORMAL LOW (ref 8.9–10.3)
Chloride: 102 mmol/L (ref 98–111)
Creatinine, Ser: 0.78 mg/dL (ref 0.44–1.00)
GFR, Estimated: >60 mL/min (ref >60)
Glucose, Bld: 112 mg/dL — ABNORMAL HIGH (ref 70–99)
Potassium: 4.1 mmol/L (ref 3.5–5.1)
Sodium: 137 mmol/L (ref 135–145)

## 2022-04-25 LAB — TROPONIN I (HIGH SENSITIVITY): Troponin I (High Sensitivity): 458 ng/L (ref <18)

## 2022-04-25 MED ORDER — FUROSEMIDE 10 MG/ML IJ SOLN: 40.0000 mg | Freq: Once | INTRAMUSCULAR | Status: DC

## 2022-04-25 MED ORDER — FUROSEMIDE 20 MG PO TABS: 20.0000 mg | ORAL_TABLET | Freq: Every day | ORAL | Status: DC

## 2022-04-25 MED ORDER — IRON SUCROSE 500 MG IVPB - SIMPLE MED
500.0000 mg | Freq: Once | INTRAVENOUS | Status: DC
  Filled 2022-04-25: qty 275

## 2022-04-25 MED ORDER — FUROSEMIDE 20 MG PO TABS: 20.0000 mg | ORAL_TABLET | Freq: Every day | ORAL | 0 refills | Status: DC

## 2022-04-25 NOTE — Consult Note (Signed)
Consultation Note   Referring Provider:  [facility] PCP: Aretta Nip, MD Primary Gastroenterologist: Althia Forts        Reason for consultation: anemia, [facility] Day: 2  ASSESSMENT   72 year old female with severe iron deficiency anemia and FOBT+.  Hgb 5. No overt GI bleeding. No focal GI symptoms   Elevated troponin.  Felt to be demand ischemia.   Thrombocytosis.   Hematology is now following.   PLAN  -Hematology consulted for thrombocytosis, JAK2 study ordered. -She is s/p IV iron, 2 u PRBCs and has been started on oral iron.  -She would like to have endoscopic evaluation done as outpatient. Otherwise she wants to know for sure that she can go home after the procedures tomorrow. I will discuss with Dr. Havery Moros and [facility].  - For echocardiogram today  HPI:  Patient is a 72 y.o. year old female with a past medical history of asthma, hypertension    see PMH for any additional medical problems.  Patient presented to ED yesterday morning with dyspnea on exertion. ED labs notable for WBC 14.8, hemoglobin 5, MCV 66, platelets 1548, BNP 955, high sensitive troponin 359.  She was admitted with acute CHF, elevated troponins acute symptomatic anemia.  She has already received a dose of IV iron and been started on oral iron.  She has been transfused 2 units of PRBCs, last one was around midnight.  Hemoglobin has improved to 7.8  Brittinie has been having frequent nose bleeds, sometimes 2-3 times a day over the last few months. Other than that she hasn't had any visible blood loss from GI tract, GU tract. She doesn't donate blood. She doesn't take NSAIDs. No prior history of anemia. She hasn't had any weight loss, abdominal pain, N/V.   No [place] of colon cancer.   Previous GI History / Evaluation :  none   Recent Imaging and Labs: DG Chest Port 1 View  Result Date: 04/24/2022 CLINICAL DATA:  Shortness of breath EXAM:  PORTABLE CHEST 1 VIEW COMPARISON:  05/11/2015 FINDINGS: Blunted costophrenic angles compatible with pleural effusions. Associated passive atelectasis. Mild enlargement of the cardiopericardial silhouette the. Mild upper zone pulmonary vascular prominence raising the possibility of pulmonary venous hypertension, but without overt edema. IMPRESSION: 1. Small bilateral pleural effusions with associated passive atelectasis. 2. Mild enlargement of the cardiopericardial silhouette with pulmonary venous hypertension but no overt edema. Electronically Signed   By: Van Clines M.D.   On: 04/24/2022 10:27      Labs:  Recent Labs    04/24/22 1011 04/25/22 0547  WBC 14.8* 16.2*  HGB 5.0* 7.8*  HCT 17.3* 24.4*  PLT 1,548* 1,282*   Recent Labs    04/24/22 1011 04/25/22 0547  NA 136 137  K 3.7 4.1  CL 106 102  CO2 24 23  GLUCOSE 128* 112*  BUN 12 11  CREATININE 0.75 0.78  CALCIUM 8.0* 8.4*   Recent Labs    04/24/22 1317  PROT 5.8*  ALBUMIN 2.2*  AST 17  ALT 10  ALKPHOS 96  BILITOT 0.4  BILIDIR <0.1  IBILI NOT CALCULATED   No results for input(s): "HEPBSAG", "HCVAB", "HEPAIGM", "HEPBIGM" in the last 72 hours. No results for input(s): "LABPROT", "INR"  in the last 72 hours.  No past medical history on file.  Past Surgical History:  Procedure Laterality Date   APPENDECTOMY     KNEE SURGERY      No family history on file.  Prior to Admission medications   Medication Sig Start Date End Date Taking? Authorizing Provider  cetirizine (ZYRTEC) 10 MG tablet Take 1 tablet (10 mg total) by mouth daily. Patient taking differently: Take 10 mg by mouth daily as needed for allergies or rhinitis. 10/09/19  Yes Hawks, Christy A, FNP  fluticasone (FLONASE) 50 MCG/ACT nasal spray Place 2 sprays into both nostrils daily. Patient taking differently: Place 2 sprays into both nostrils at bedtime as needed for allergies or rhinitis. 10/09/19  Yes Hawks, Christy A, FNP  lisinopril (ZESTRIL) 40  MG tablet Take 40 mg by mouth in the morning.   Yes [provider]  lovastatin (MEVACOR) 20 MG tablet Take 20 mg by mouth at bedtime.   Yes [provider]  PROAIR HFA 108 (90 Base) MCG/ACT inhaler Inhale 2 puffs into the lungs every 6 (six) hours as needed for wheezing or shortness of breath.   Yes [provider]  sertraline (ZOLOFT) 100 MG tablet Take 150 mg by mouth in the morning.   Yes [provider]  SYMBICORT 160-4.5 MCG/ACT inhaler Inhale 2 puffs into the lungs in the morning and at bedtime.   Yes [provider]  predniSONE (STERAPRED UNI-PAK 21 TAB) 10 MG (21) TBPK tablet 6 tabs for 1 day, then 5 tabs for 1 das, then 4 tabs for 1 day, then 3 tabs for 1 day, 2 tabs for 1 day, then 1 tab for 1 day Patient not taking: Reported on 04/24/2022 04/09/19   Orvan July, NP    Current Facility-Administered Medications  Medication Dose Route Frequency Provider Last Rate Last Admin   0.9 %  sodium chloride infusion  250 mL Intravenous PRN Wynetta Fines T, MD 10 mL/hr at 04/24/22 1543 250 mL at 04/24/22 1543   acetaminophen (TYLENOL) tablet 650 mg  650 mg Oral Q4H PRN Wynetta Fines T, MD       enoxaparin (LOVENOX) injection 40 mg  40 mg Subcutaneous Q24H Wynetta Fines T, MD   40 mg at 04/24/22 1405   ferrous sulfate tablet 325 mg  325 mg Oral Q breakfast Wynetta Fines T, MD       furosemide (LASIX) injection 20 mg  20 mg Intravenous Daily Wynetta Fines T, MD       lisinopril (ZESTRIL) tablet 20 mg  20 mg Oral Daily Wynetta Fines T, MD       loratadine (CLARITIN) tablet 10 mg  10 mg Oral Daily PRN Donnamae Jude, RPH       ondansetron [facility]) injection 4 mg  4 mg Intravenous Q6H PRN Wynetta Fines T, MD       pantoprazole (PROTONIX) injection 40 mg  40 mg Intravenous Q12H Wynetta Fines T, MD   40 mg at 04/24/22 2213   sertraline (ZOLOFT) tablet 150 mg  150 mg Oral Daily Wynetta Fines T, MD       sodium chloride flush (NS) 0.9 % injection 3 mL  3 mL Intravenous Q12H Wynetta Fines T, MD   3 mL at 04/24/22 2213   sodium chloride flush (NS) 0.9 % injection 3 mL  3 mL Intravenous PRN Lequita Halt, MD        Allergies as of 04/24/2022 - Review Complete 04/24/2022  Allergen Reaction Noted   Aspirin Other (See Comments) 05/11/2015   Ibuprofen Swelling and Other (See Comments) 11/28/2013   Sulfonamide derivatives Swelling and Other (See Comments) 11/28/2013   Penicillins Rash 11/28/2013    Social History   Socioeconomic History   Marital status: Divorced    Spouse name: Not on file   Number of children: Not on file   Years of education: Not on file   Highest education level: Not on file  Occupational History   Not on file  Tobacco Use   Smoking status: Former   Smokeless tobacco: Not on file  Substance and Sexual Activity   Alcohol use: No   Drug use: No   Sexual activity: Not on file  Other Topics Concern   Not on file  Social History Narrative   Not on file   Social Determinants of Health   Financial Resource Strain: Not on file  Food Insecurity: No Food Insecurity (04/24/2022)   Hunger Vital Sign    Worried About Running Out of Food in the Last Year: Never true    Ran Out of Food in the Last Year: Never true  Transportation Needs: No Transportation Needs (04/24/2022)   PRAPARE - Hydrologist (Medical): No    Lack of Transportation (Non-Medical): No  Physical Activity: Not on file  Stress: Not on file  Social Connections: Not on file  Intimate Partner Violence: Not At Risk (04/24/2022)   Humiliation, Afraid, Rape, and Kick questionnaire    Fear of Current or Ex-Partner: No    Emotionally Abused: No    Physically Abused: No    Sexually Abused: No    Review of Systems: All systems reviewed and negative except where noted in HPI.  Physical Exam: Vital signs in last 24 hours: Temp:  [98 F (36.7 C)-99.5 F (37.5 C)] 98.1 F (36.7 C) (03/09 0722) Pulse Rate:  [84-108] 100 (03/09 0722) Resp:  [13-21] 19 (03/09  0722) BP: (115-154)/(58-84) 149/82 (03/09 0722) SpO2:  [95 %-100 %] 96 % (03/09 0722) Weight:  [77.1 kg-78.1 kg] 78.1 kg (03/09 0535) Last BM Date : 04/23/22  General:  Alert female in NAD Psych:  Cooperative. Normal mood and affect Eyes: Pupils equal Ears:  Normal auditory acuity Nose: No deformity, discharge or lesions Neck:  Supple, no masses felt Lungs:  Clear to auscultation.  Heart:  Regular rate, regular rhythm.  Abdomen:  Soft, nondistended, nontender, active bowel sounds, no masses felt Rectal :  Deferred Msk: Symmetrical without gross deformities.  Neurologic:  Alert, oriented, grossly normal neurologically Extremities : No edema Skin:  Intact without significant lesions.    Intake/Output from previous day: 03/08 0701 - 03/09 0700 In: 1303.1 [Blood:1032.5; IV Piggyback:270.6] Out: [id number] [Urine:850] Intake/Output this shift:  Total I/O In: -  Out: 300 [Urine:300]    Principal Problem:   CHF (congestive heart failure) (HCC) Active Problems:   CHF (congestive heart failure), NYHA class I, acute, diastolic (HCC)   Normocytic anemia   Thrombocytosis   Elevated troponin    Tye Savoy, NP-C @  04/25/2022, 8:40 AM

## 2022-04-25 NOTE — Assessment & Plan Note (Signed)
Transition from lisinopril to losartan Continue blood pressure monitoring,

## 2022-04-25 NOTE — Assessment & Plan Note (Signed)
Follow up Hgb is 7.8 post 2 units PRBC. Thrombocytosis 1,282.  Plan to add another dose of IV iron to complete 1000 mg per hematology recommendations.  She has no current epistaxis. Needs further work up for occult gastrointestinal bleeding.   Follow up Hgb in am.  Patient does not want to stay in the hospital for endoscopic procedures or abdominal CT scan.

## 2022-04-25 NOTE — Progress Notes (Signed)
PT requesting to have IV lines removed as she wants to leave AMA. Dr. Cathlean Sauer did talk with PT earlier. Both IV lines were removed intact and with no bleeding from bandages. She agreed that all belongings were retained with her. PT was taken down to [facility] tower entrance by wheelchair where she was assisted into her son's car with seatbelt applied.PT appear in no outward acute distress at that time.

## 2022-04-25 NOTE — Consult Note (Signed)
Hematology:  Thrombocytosis: Most likely reactive due to Iron deficiency anemia. Agree with Iron infusion and supportive care with Blood as needed. Sent Jak-2 mutation testing. Platelet count improving. Will need a total of 1000 mg of IV Iron. If patient decides to get discharged, we can see her and continue as outpatient. It is very important that she gets a colonoscopy and endoscopy and CT abd to identify the cause of the severe iron def anemia  Will follow patient as outpatient upon discharge

## 2022-04-25 NOTE — Assessment & Plan Note (Deleted)
Echocardiogram is pending, per my image review seems lowe EF with dilated LA and LV cavity.   Urine output is documented at 123456 ml Systolic blood pressure is 149 to 136 mmHg.  Will add po spironolactone and change lisinopril for losartan. Continue diuresis with furosemide IV total of 60 mg today

## 2022-04-25 NOTE — Progress Notes (Signed)
I have informed Debra Sharp about the new findings of her echocardiogram with reduction in LV systolic function (heart failure), wall motion abnormalities, pericardial effusion. She has a very high risk to have a cardiac event including worsening heart failure and death.  Her son is at the bedside.  I have asked Debra Sharp if there is anything I can do to make sure she is more comfortable in order to stay in the hospital.  She understands the risk of leaving the hospital against medical advice includuing death. I will send a prescription for furosemide to her pharmacy, she needs to call her primary care as soon as possible to continue her care.   BP 136/71 (BP Location: Left Arm)   Pulse 94   Temp 98.1 F (36.7 C) (Oral)   Resp 18   Ht '5\' 6"'$  (1.676 m)   Wt 78.1 kg   SpO2 96%   BMI 27.79 kg/m

## 2022-04-25 NOTE — Hospital Course (Addendum)
Mrs. Amendt was admitted to the hospital with the working diagnosis of symptomatic anemia.   72 yo female with the past medical history of hypertension and dyslipidemia who presented with dyspnea. Reported one week of exertional dyspnea to the point where she got symptomatic with minimal efforts. On her initial physical examination her blood pressure was 133/59, HR 92, RR 18 and 02 saturation 97%, lungs with rales bilaterally, heart with  S1 and S2 present and rhythmic, abdomen with no distention , no lower extremity edema.   Na 136, K 3,7 Cl 196 bicarbonate 24, glucose 128, bun 12 cr 0,75 BNP 955. High sensitive troponin 359 and 392  Iron panel with serum iron 9, TIBC 314, transferrin saturation 3 and ferritin 15.  Wbc 14,8 hgb 5,0 plt 1,534  Sars covid 19 negative   Chest radiograph with cardiomegaly, bilateral hilar vascular congestion, cephalization of the vasculature, and bilateral small pleural effusions.   EKG 102 bpm, normal axis, normal intervals, sinus rhythm with no significant ST segment or T wave changes.   03/08 patient is feeling better after PRBC transfusion, she does not want to stay in the hospital for further anemia workup.  She mentioned will follow up as outpatient.

## 2022-04-25 NOTE — Assessment & Plan Note (Signed)
No current epsitaxis, will have outpatient follow up with ENT.

## 2022-04-25 NOTE — Assessment & Plan Note (Signed)
No signs of exacerbation  

## 2022-04-25 NOTE — Assessment & Plan Note (Signed)
Echocardiogram is pending, per my image review seems lowe EF with dilated LA and LV cavity.   Urine output is documented at 123456 ml Systolic blood pressure is 149 to 136 mmHg.  Will add po spironolactone and change lisinopril for losartan. Continue diuresis with furosemide IV total of 60 mg today

## 2022-04-25 NOTE — Progress Notes (Signed)
Progress Note   Patient: Debra Sharp [id number] DOB: 1950-05-04 DOA: 04/24/2022     1 DOS: the patient was seen and examined on 04/25/2022   Brief hospital course: Debra Sharp was admitted to the hospital with the working diagnosis of symptomatic anemia.   72 yo female with the past medical history of hypertension and dyslipidemia who presented with dyspnea. Reported one week of exertional dyspnea to the point where she got symptomatic with minimal efforts. On her initial physical examination her blood pressure was 133/59, HR 92, RR 18 and 02 saturation 97%, lungs with rales bilaterally, heart with  S1 and S2 present and rhythmic, abdomen with no distention , no lower extremity edema.   Na 136, K 3,7 Cl 196 bicarbonate 24, glucose 128, bun 12 cr 0,75 BNP 955. High sensitive troponin 359 and 392  Iron panel with serum iron 9, TIBC 314, transferrin saturation 3 and ferritin 15.  Wbc 14,8 hgb 5,0 plt 1,534  Sars covid 19 negative   Chest radiograph with cardiomegaly, bilateral hilar vascular congestion, cephalization of the vasculature, and bilateral small pleural effusions.   EKG 102 bpm, normal axis, normal intervals, sinus rhythm with no significant ST segment or T wave changes.   03/08 patient is feeling better after PRBC transfusion, she does not want to stay in the hospital for further anemia workup.  She mentioned will follow up as outpatient.   Assessment and Plan: * Symptomatic anemia Follow up Hgb is 7.8 post 2 units PRBC. Thrombocytosis 1,282.  Plan to add another dose of IV iron to complete 1000 mg per hematology recommendations.  She has no current epistaxis. Needs further work up for occult gastrointestinal bleeding.   Follow up Hgb in am.  Patient does not want to stay in the hospital for endoscopic procedures or abdominal CT scan.   Heart failure (HCC) Echocardiogram is pending, per my image review seems lowe EF with dilated LA and LV cavity.   Urine output is  documented at 123456 ml Systolic blood pressure is 149 to 136 mmHg.  Will add po spironolactone and change lisinopril for losartan. Continue diuresis with furosemide IV total of 60 mg today   Epistaxis No current epsitaxis, will have outpatient follow up with ENT.   Essential hypertension Transition from lisinopril to losartan Continue blood pressure monitoring,   Other specified chronic obstructive pulmonary disease No signs of exacerbation.         Subjective: Patient with improvement in dyspnea, no chest pain, no epistaxis.   Physical Exam: Vitals:   04/25/22 0315 04/25/22 0535 04/25/22 0722 04/25/22 1128  BP: (!) 152/73 (!) 152/73 (!) 149/82 136/71  Pulse: 92 93 100 94  Resp: '18 18 19 18  '$ Temp: 98 F (36.7 C) 98.3 F (36.8 C) 98.1 F (36.7 C)   TempSrc: Oral Oral Oral   SpO2: 96% 95% 96% 96%  Weight:  78.1 kg    Height:       Neurology awake and alert ENT with mild pallor Cardiovascular with S1 and S2 present and rhythmic with no gallops, rubs or murmurs Respiratory with mild rales at bases with no wheezing or rhonchi Abdomen with no distention  Positive non pitting lower extremity edema  Data Reviewed:    Family Communication: I spoke with patient's son at the bedside, we talked in detail about patient's condition, plan of care and prognosis and all questions were addressed.   Disposition: Status is: Inpatient Remains inpatient appropriate because: heart failure   Planned Discharge  Destination: Home      Author: Tawni Millers, MD 04/25/2022 1:20 PM  For on call review [email or web address].

## 2022-04-26 LAB — TYPE AND SCREEN
ABO/RH(D): A POS
Antibody Screen: NEGATIVE
Unit division: 0
Unit division: 0

## 2022-04-26 LAB — BPAM RBC
Blood Product Expiration Date: 202403292359
Blood Product Expiration Date: 202403292359
ISSUE DATE / TIME: 202403081620
ISSUE DATE / TIME: 202403090043
Unit Type and Rh: 6200
Unit Type and Rh: 6200

## 2022-04-26 LAB — HAPTOGLOBIN: Haptoglobin: 488 mg/dL — ABNORMAL HIGH (ref 37–355)

## 2022-04-26 NOTE — Discharge Summary (Signed)
Physician Discharge Summary   Patient: Debra Sharp MRN: [id number] DOB: April 17, 1950  Admit date:     04/24/2022  Discharge date: 04/25/2022  Discharge Physician: Debra Sharp   PCP: Debra Sharp   Recommendations at discharge:    Patient left the hospital against medical advice.    I have informed Debra Sharp about the new findings of her echocardiogram with reduction in LV systolic function (heart failure), wall motion abnormalities, pericardial effusion. She has a very high risk to have a cardiac event including worsening heart failure and death.  Her son is at the bedside.  I have asked Debra Sharp if there is anything I can do to make sure she is more comfortable in order to stay in the hospital.  She understands the risk of leaving the hospital against medical advice includuing death. I will send a prescription for furosemide to her pharmacy, she needs to call her primary care as soon as possible to continue her care.       Discharge Diagnoses: Principal Problem:   Symptomatic anemia Active Problems:   Heart failure (HCC)   Epistaxis   Essential hypertension   Other specified chronic obstructive pulmonary disease  Resolved Problems:   * No resolved hospital problems. [facility] Course: Debra Sharp was admitted to the hospital with the working diagnosis of symptomatic anemia.   72 yo female with the past medical history of hypertension and dyslipidemia who presented with dyspnea. Reported one week of exertional dyspnea to the point where she got symptomatic with minimal efforts. On her initial physical examination her blood pressure was 133/59, HR 92, RR 18 and 02 saturation 97%, lungs with rales bilaterally, heart with  S1 and S2 present and rhythmic, abdomen with no distention , no lower extremity edema.   Na 136, K 3,7 Cl 196 bicarbonate 24, glucose 128, bun 12 cr 0,75 BNP 955. High sensitive troponin 359 and 392  Iron panel with serum iron 9, TIBC  314, transferrin saturation 3 and ferritin 15.  Wbc 14,8 hgb 5,0 plt 1,534  Sars covid 19 negative   Chest radiograph with cardiomegaly, bilateral hilar vascular congestion, cephalization of the vasculature, and bilateral small pleural effusions.   EKG 102 bpm, normal axis, normal intervals, sinus rhythm with no significant ST segment or T wave changes.   03/08 patient is feeling better after PRBC transfusion, she does not want to stay in the hospital for further anemia workup.  She mentioned will follow up as outpatient.   Assessment and Plan: * Symptomatic anemia Follow up Hgb is 7.8 post 2 units PRBC. Thrombocytosis 1,282.  Plan to add another dose of IV iron to complete 1000 mg per hematology recommendations.  She has no current epistaxis. Needs further work up for occult gastrointestinal bleeding.   Follow up Hgb in am.  Patient does not want to stay in the hospital for endoscopic procedures or abdominal CT scan.   Heart failure (HCC) Echocardiogram is pending, per my image review seems lowe EF with dilated LA and LV cavity.   Urine output is documented at 123456 ml Systolic blood pressure is 149 to 136 mmHg.  Will add po spironolactone and change lisinopril for losartan. Continue diuresis with furosemide IV total of 60 mg today   Epistaxis No current epsitaxis, will have outpatient follow up with ENT.   Essential hypertension Transition from lisinopril to losartan Continue blood pressure monitoring,   Other specified chronic obstructive pulmonary disease No signs of exacerbation.  DISCHARGE MEDICATION: Allergies as of 04/25/2022       Reactions   Aspirin Other (See Comments)   Caused an asthma attack   Ibuprofen Swelling, Other (See Comments)   Angioedema and Asthma exacerbation   Sulfonamide Derivatives Swelling, Other (See Comments)   Face swelled SEVERELY   Penicillins Rash        Medication List     TAKE these medications     furosemide 20 MG tablet Commonly known as: LASIX Take 1 tablet (20 mg total) by mouth daily.       ASK your doctor about these medications    cetirizine 10 MG tablet Commonly known as: ZYRTEC Take 1 tablet (10 mg total) by mouth daily.   fluticasone 50 MCG/ACT nasal spray Commonly known as: FLONASE Place 2 sprays into both nostrils daily.   lisinopril 40 MG tablet Commonly known as: ZESTRIL Take 40 mg by mouth in the morning.   lovastatin 20 MG tablet Commonly known as: MEVACOR Take 20 mg by mouth at bedtime.   predniSONE 10 MG (21) Tbpk tablet Commonly known as: STERAPRED UNI-PAK 21 TAB 6 tabs for 1 day, then 5 tabs for 1 das, then 4 tabs for 1 day, then 3 tabs for 1 day, 2 tabs for 1 day, then 1 tab for 1 day   ProAir HFA 108 (90 Base) MCG/ACT inhaler Generic drug: albuterol Inhale 2 puffs into the lungs every 6 (six) hours as needed for wheezing or shortness of breath.   sertraline 100 MG tablet Commonly known as: ZOLOFT Take 150 mg by mouth in the morning.   Symbicort 160-4.5 MCG/ACT inhaler Generic drug: budesonide-formoterol Inhale 2 puffs into the lungs in the morning and at bedtime.        Follow-up Information     [facility], Bill [place], [facility] Follow up.   Specialty: Family Medicine Contact information: [place] [address] [phone]                Discharge Exam: Debra Sharp Weights   04/24/22 0943 04/25/22 0535  Weight: 77.1 kg 78.1 kg     The results of significant diagnostics from this hospitalization (including imaging, microbiology, ancillary and laboratory) are listed below for reference.   Imaging Studies: ECHOCARDIOGRAM COMPLETE  Result Date: 04/25/2022    ECHOCARDIOGRAM REPORT   Patient Name:   Debra Sharp Date of Exam: 04/25/2022 Medical Rec #:  [id number]     Height:       66.0 in Accession #:    [id number]    Weight:       172.2 lb Date of Birth:  [birth year]     BSA:          1.876 m Patient Age:    72 years       BP:           149/82 mmHg Patient Gender: F             HR:           105 bpm. Exam Location:  Inpatient Procedure: 2D Echo, 3D Echo, Cardiac Doppler, Color Doppler and Strain Analysis Indications:    CHF-Acute Diastolic [id number] ;  History:        Patient has no prior history of Echocardiogram examinations.                 CHF, COPD; Risk Factors:Hypertension and Former Smoker.  Sonographer:    Sharp Aye RVT RCS Referring Phys: [id number] Lequita Halt  IMPRESSIONS  1. Left ventricular ejection fraction, by estimation, is 30 to 35%. The left ventricle has moderately decreased function. The left ventricle demonstrates regional wall motion abnormalities (suggestive of LAD disease or stress cardiomyopathy  2. The left ventricular internal cavity size was severely dilated. There is mild left ventricular hypertrophy. Left ventricular diastolic parameters are indeterminate.  3. Large pericardial effusion. The pericardial effusion is anterior to the right ventricle. There is evidence of fibrinous material in the pericardial space. IV is small and collapsible, study not suggestive of increase pericardial pressure.  4. Right ventricular systolic function is normal. The right ventricular size is normal.  5. Left atrial size was moderately dilated.  6. The mitral valve is normal in structure. No evidence of mitral valve regurgitation. No evidence of mitral stenosis.  7. The aortic valve is tricuspid. Aortic valve regurgitation is not visualized. Aortic valve sclerosis/calcification is present, without any evidence of aortic stenosis.  8. The inferior vena cava is normal in size with greater than 50% respiratory variability, suggesting right atrial pressure of 3 mmHg. Comparison(s): No prior Echocardiogram. Conclusion(s)/Recommendation(s): Reaching out to primary team. FINDINGS  Left Ventricle: Left ventricular ejection fraction, by estimation, is 30 to 35%. The left ventricle has moderately decreased function. The left  ventricle demonstrates regional wall motion abnormalities. The left ventricular internal cavity size was severely dilated. There is mild left ventricular hypertrophy. Left ventricular diastolic parameters are indeterminate.  LV Wall Scoring: The mid and distal anterior wall, mid and distal lateral wall, mid and distal anterior septum, entire apex, mid and distal inferior wall, mid anterolateral segment, and mid inferoseptal segment are hypokinetic. Right Ventricle: The right ventricular size is normal. No increase in right ventricular wall thickness. Right ventricular systolic function is normal. Left Atrium: Left atrial size was moderately dilated. Right Atrium: Right atrial size was normal in size. Pericardium: A large pericardial effusion is present. The pericardial effusion is anterior to the right ventricle. The pericardial effusion appears to contain fibrous material and mixed echogenic material. Mitral Valve: The mitral valve is normal in structure. No evidence of mitral valve regurgitation. No evidence of mitral valve stenosis. Tricuspid Valve: The tricuspid valve is not well visualized. Tricuspid valve regurgitation is not demonstrated. No evidence of tricuspid stenosis. Aortic Valve: The aortic valve is tricuspid. Aortic valve regurgitation is not visualized. Aortic valve sclerosis/calcification is present, without any evidence of aortic stenosis. Aortic valve mean gradient measures 4.0 mmHg. Aortic valve peak gradient measures 7.9 mmHg. Aortic valve area, by VTI measures 2.00 cm. Pulmonic Valve: The pulmonic valve was normal in structure. Pulmonic valve regurgitation is not visualized. No evidence of pulmonic stenosis. Aorta: The aortic root is normal in size and structure. Venous: The inferior vena cava is normal in size with greater than 50% respiratory variability, suggesting right atrial pressure of 3 mmHg. IAS/Shunts: The interatrial septum was not well visualized.  LEFT VENTRICLE PLAX 2D LVIDd:          6.20 cm LVIDs:         4.20 cm   2D Longitudinal Strain LV PW:         1.10 cm   2D Strain GLS Avg:     -11.0 % LV IVS:        1.00 cm LVOT diam:     1.80 cm LV SV:         38 LV SV Index:   20        3D Volume EF: LVOT Area:  2.54 cm  3D EF:        43 %                          LV EDV:       180 ml                          LV ESV:       102 ml                          LV SV:        78 ml RIGHT VENTRICLE             IVC RV Basal diam:  2.90 cm     IVC diam: 1.80 cm RV S prime:     21.40 cm/s TAPSE (M-mode): 2.5 cm LEFT ATRIUM             Index        RIGHT ATRIUM          Index LA diam:        4.90 cm 2.61 cm/m   RA Area:     8.85 cm LA Vol (A2C):   65.4 ml 34.85 ml/m  RA Volume:   18.40 ml 9.81 ml/m LA Vol (A4C):   82.4 ml 43.91 ml/m LA Biplane Vol: 76.7 ml 40.87 ml/m  AORTIC VALVE                    PULMONIC VALVE AV Area (Vmax):    1.97 cm     PV Vmax:          1.16 m/s AV Area (Vmean):   2.01 cm     PV Peak grad:     5.3 mmHg AV Area (VTI):     2.00 cm     PR End Diast Vel: 6.35 msec AV Vmax:           140.50 cm/s AV Vmean:          92.700 cm/s AV VTI:            0.190 m AV Peak Grad:      7.9 mmHg AV Mean Grad:      4.0 mmHg LVOT Vmax:         109.00 cm/s LVOT Vmean:        73.200 cm/s LVOT VTI:          0.150 m LVOT/AV VTI ratio: 0.78  AORTA Ao Root diam: 3.30 cm Ao Arch diam: 3.3 cm MITRAL VALVE MV Area (PHT): 3.20 cm    SHUNTS MV Decel Time: 237 msec    Systemic VTI:  0.15 m MV E velocity: 81.90 cm/s  Systemic Diam: 1.80 cm MV A velocity: 95.60 cm/s MV E/A ratio:  0.86 Rudean Haskell Sharp Electronically signed by Rudean Haskell Sharp Signature Date/Time: 04/25/2022/1:15:33 PM    Final    DG Chest Port 1 View  Result Date: 04/24/2022 CLINICAL DATA:  Shortness of breath EXAM: PORTABLE CHEST 1 VIEW COMPARISON:  05/11/2015 FINDINGS: Blunted costophrenic angles compatible with pleural effusions. Associated passive atelectasis. Mild enlargement of the cardiopericardial silhouette the.  Mild upper zone pulmonary vascular prominence raising the possibility of pulmonary venous hypertension, but without overt edema. IMPRESSION: 1. Small bilateral pleural effusions with associated passive atelectasis. 2. Mild enlargement of the cardiopericardial silhouette with pulmonary venous hypertension but  no overt edema. Electronically Signed   By: Van Clines M.D.   On: 04/24/2022 10:27    Microbiology: Results for orders placed or performed during the hospital encounter of 04/24/22  Resp panel by RT-PCR (RSV, Flu A&B, Covid) Anterior Nasal Swab     Status: None   Collection Time: 04/24/22 10:02 AM   Specimen: Anterior Nasal Swab  Result Value Ref Range Status   SARS Coronavirus 2 by RT PCR NEGATIVE NEGATIVE Final   Influenza A by PCR NEGATIVE NEGATIVE Final   Influenza B by PCR NEGATIVE NEGATIVE Final    Comment: (NOTE) The Xpert Xpress SARS-CoV-2/FLU/RSV plus assay is intended as an aid in the diagnosis of influenza from Nasopharyngeal swab specimens and should not be used as a sole basis for treatment. Nasal washings and aspirates are unacceptable for Xpert Xpress SARS-CoV-2/FLU/RSV testing.  Fact Sheet for Patients: EntrepreneurPulse.com.au  Fact Sheet for Healthcare Providers: IncredibleEmployment.be  This test is not yet approved or cleared by the [place] FDA and has been authorized for detection and/or diagnosis of SARS-CoV-2 by FDA under an Emergency Use Authorization (EUA). This EUA will remain in effect (meaning this test can be used) for the duration of the COVID-19 declaration under Section 564(b)(1) of the Act, 21 U.S.C. section 360bbb-3(b)(1), unless the authorization is terminated or revoked.     Resp Syncytial Virus by PCR NEGATIVE NEGATIVE Final    Comment: (NOTE) Fact Sheet for Patients: EntrepreneurPulse.com.au  Fact Sheet for Healthcare  Providers: IncredibleEmployment.be  This test is not yet approved or cleared by the [place] FDA and has been authorized for detection and/or diagnosis of SARS-CoV-2 by FDA under an Emergency Use Authorization (EUA). This EUA will remain in effect (meaning this test can be used) for the duration of the COVID-19 declaration under Section 564(b)(1) of the Act, 21 U.S.C. section 360bbb-3(b)(1), unless the authorization is terminated or revoked.  Performed at [facility], [place] [address], [place], [place] [address]     Labs: CBC: Recent Labs  Lab 04/24/22 1011 04/25/22 0547  WBC 14.8* 16.2*  NEUTROABS 14.1*  --   HGB 5.0* 7.8*  HCT 17.3* 24.4*  MCV 66.3* 69.1*  PLT 1,548* [id number]*   Basic Metabolic Panel: Recent Labs  Lab 04/24/22 1011 04/25/22 0547  NA 136 137  K 3.7 4.1  CL 106 102  CO2 24 23  GLUCOSE 128* 112*  BUN 12 11  CREATININE 0.75 0.78  CALCIUM 8.0* 8.4*   Liver Function Tests: Recent Labs  Lab 04/24/22 1317  AST 17  ALT 10  ALKPHOS 96  BILITOT 0.4  PROT 5.8*  ALBUMIN 2.2*   CBG: No results for input(s): "GLUCAP" in the last 168 hours.    Signed: Tawni Millers, [facility] 04/26/2022

## 2022-04-28 LAB — PATHOLOGIST SMEAR REVIEW

## 2022-04-29 ENCOUNTER — Telehealth: Payer: Self-pay

## 2022-04-29 NOTE — Telephone Encounter (Signed)
Note: 3-25 spot at [facility] has been taken

## 2022-04-29 NOTE — Telephone Encounter (Signed)
-----   Message from Yetta Flock, MD sent at 04/28/2022  6:45 PM EDT ----- Regarding: RE: sorry oupatient scopes - this is correct patient  If she is willing to do 3/25 that would be preferred if possible, given I think 4/11 is taken now or is there still room? If 4/11 is open that would also be fine. Thanks  ----- Message ----- From: Roetta Sessions, CMA Sent: 04/28/2022   2:44 PM EDT To: Yetta Flock, MD Subject: RE: sorry oupatient scopes - this is correct#  I am happy to offer her March 25th however, you have 2 other days that a schedule has been started for but are not full yet -   4-11 at [facility] (2 openings) And 4-25 at [facility]  (3 openings)  Would you rather fill 4-11 at [facility] first or offer her March 25th at [facility]?   ----- Message ----- From: Yetta Flock, MD Sent: 04/28/2022   8:06 AM EDT To: Roetta Sessions, CMA Subject: RE: sorry oupatient scopes - this is correct#  Thanks Jan. I could add her 80 AM March 25th at [place] - I am the hospital MD that AM and could do her case then, if she is willing? Thanks  ----- Message ----- From: Roetta Sessions, CMA Sent: 04/27/2022  11:33 AM EDT To: Yetta Flock, MD Subject: RE: sorry oupatient scopes - this is correct#  Her ECHO from 3-9 shows EF of 30-35%.  You could scope her at [facility] on Thursday 4-11 Please advise. Thanks, Jan   ----- Message ----- From: Yetta Flock, MD Sent: 04/25/2022  11:40 AM EDT To: Yetta Flock, MD; Roetta Sessions, CMA Subject: sorry oupatient scopes - this is correct pat#  Jan this patient needs an EGD and colonoscopy as outpatient ASAP. She is signing out from the hospital to do this against my advice. I will touch base with you this upcoming week to determine where this needs to be done, outpatient or in the hospital based on results of her echo etc. I will touch base with you more about it. Thanks

## 2022-04-29 NOTE — Telephone Encounter (Signed)
Left detailed message for patient that we need to get her scheduled for [facility] at Hospital. Asked her to call back to discuss possibility of having done 3-25 or 4-11 at [facility]

## 2022-05-01 ENCOUNTER — Other Ambulatory Visit: Payer: Self-pay

## 2022-05-01 DIAGNOSIS — D509 Iron deficiency anemia, unspecified: Secondary | ICD-10-CM

## 2022-05-01 DIAGNOSIS — I5031 Acute diastolic (congestive) heart failure: Secondary | ICD-10-CM

## 2022-05-01 NOTE — Telephone Encounter (Signed)
Wonderful thank you Jan

## 2022-05-01 NOTE — Telephone Encounter (Signed)
Patient has been scheduled for an ECL on 4-11 at 9:45am at [facility]. Case# [id number].  Patient has been scheduled for a PV on  Tuesday, 3-26 at 2:00pm.  MyChart message sent patient re: procedure and PV.

## 2022-05-01 NOTE — Telephone Encounter (Signed)
No she didn't I called her twice but she didn't answer.. I left a voicemail for her to call back

## 2022-05-01 NOTE — Telephone Encounter (Signed)
Patient is returning your call.  

## 2022-05-01 NOTE — Telephone Encounter (Signed)
Called and left message for patient to call back to get scheduled for ECL with Dr. Havery Moros. Would like to see if we can schedule her for 4-11 at [facility]

## 2022-05-01 NOTE — Telephone Encounter (Signed)
Patient is returning call, states 4/11 works fine for her. Please advise

## 2022-05-04 LAB — JAK2 V617F RFX CALR/MPL/E12-15

## 2022-05-04 LAB — CALR +MPL + E12-E15  (REFLEX)

## 2022-05-13 ENCOUNTER — Ambulatory Visit (AMBULATORY_SURGERY_CENTER): Payer: Self-pay | Admitting: *Deleted

## 2022-05-13 VITALS — Ht 66.0 in | Wt 170.0 lb

## 2022-05-13 DIAGNOSIS — D508 Other iron deficiency anemias: Secondary | ICD-10-CM

## 2022-05-13 MED ORDER — PEG 3350-KCL-NA BICARB-NACL 420 G PO SOLR: 4000.0000 mL | Freq: Once | ORAL | 0 refills | Status: AC

## 2022-05-13 NOTE — Progress Notes (Signed)
No egg or soy allergy known to patient  No issues known to pt with past sedation with any surgeries or procedures Patient denies ever being told they had issues or difficulty with intubation  No FH of Malignant Hyperthermia Pt is not on diet pills Pt is not on  home 02  Pt is not on blood thinners  Pt denies issues with constipation  No A fib or A flutter Have any cardiac testing pending--no Pt instructed to use [email or web address] or GoodRx for a price reduction on prep   

## 2022-05-20 ENCOUNTER — Telehealth: Payer: Self-pay

## 2022-05-20 NOTE — Telephone Encounter (Signed)
Jan thanks for letting me know, Not sure if there is anyone else on the wait list that could use that spot in the interim? Thanks

## 2022-05-20 NOTE — Telephone Encounter (Signed)
Rec'd message from Endo Unit that patient cancelled her procedure at [facility] with Dr. Havery Moros on 4-11, next Thursday.  Called and Left message for patient that Dr Havery Moros has an opening at [facility] on Thursday 4-25. Asked her to call back asap to let us know if she is available that day.

## 2022-05-20 NOTE — Telephone Encounter (Signed)
Dr. Havery Moros, you do not currently have anyone that could take that spot.

## 2022-05-26 NOTE — Telephone Encounter (Signed)
Left message for patient to call back to discuss rescheduling her ECL with Dr. Adela Lank at the hospital

## 2022-05-28 ENCOUNTER — Ambulatory Visit (HOSPITAL_COMMUNITY): Admit: 2022-05-28 | Payer: Self-pay | Admitting: Gastroenterology

## 2022-05-28 ENCOUNTER — Encounter (HOSPITAL_COMMUNITY): Payer: Self-pay

## 2022-05-28 SURGERY — COLONOSCOPY WITH PROPOFOL
Anesthesia: Monitor Anesthesia Care

## 2022-07-30 ENCOUNTER — Telehealth: Payer: Self-pay

## 2022-07-30 NOTE — Telephone Encounter (Signed)
Left detailed message for patient tocall back to discuss whether or not she would like to be rescheduled for ECL at the hospital with [facility].  We have left her several message over the last few months and have not heard from her.  If she does not wish to be rescheduled we would appreciate knowing that and why so it can be documented.  Otherwise we would like to get her scheduled for EGD Colonoscopy soon.

## 2022-08-03 NOTE — Telephone Encounter (Signed)
Called patient and left another message that we would like to reschedule her ECL if she is interested.  Asked that she call us back to clarify her interest

## 2022-08-17 DIAGNOSIS — C801 Malignant (primary) neoplasm, unspecified: Secondary | ICD-10-CM

## 2022-08-17 HISTORY — DX: Malignant (primary) neoplasm, unspecified: C80.1

## 2022-08-18 ENCOUNTER — Other Ambulatory Visit: Payer: Self-pay

## 2022-08-18 ENCOUNTER — Encounter (HOSPITAL_COMMUNITY): Payer: Self-pay

## 2022-08-18 ENCOUNTER — Inpatient Hospital Stay (HOSPITAL_COMMUNITY)
Admission: EM | Admit: 2022-08-18 | Discharge: 2022-09-16 | DRG: 329 | Disposition: A | Discharge status: Other Acute Inpatient Hospital | Payer: Medicare Other | Attending: Student | Admitting: Student

## 2022-08-18 ENCOUNTER — Inpatient Hospital Stay (HOSPITAL_COMMUNITY): Payer: Medicare Other

## 2022-08-18 ENCOUNTER — Emergency Department (HOSPITAL_COMMUNITY): Payer: Medicare Other

## 2022-08-18 DIAGNOSIS — Z8673 Personal history of transient ischemic attack (TIA), and cerebral infarction without residual deficits: Secondary | ICD-10-CM

## 2022-08-18 DIAGNOSIS — D259 Leiomyoma of uterus, unspecified: Secondary | ICD-10-CM

## 2022-08-18 DIAGNOSIS — R195 Other fecal abnormalities: Secondary | ICD-10-CM

## 2022-08-18 DIAGNOSIS — I9589 Other hypotension: Secondary | ICD-10-CM | POA: Diagnosis not present

## 2022-08-18 DIAGNOSIS — D6859 Other primary thrombophilia: Secondary | ICD-10-CM | POA: Diagnosis not present

## 2022-08-18 DIAGNOSIS — I6349 Cerebral infarction due to embolism of other cerebral artery: Secondary | ICD-10-CM | POA: Diagnosis not present

## 2022-08-18 DIAGNOSIS — C786 Secondary malignant neoplasm of retroperitoneum and peritoneum: Secondary | ICD-10-CM | POA: Diagnosis present

## 2022-08-18 DIAGNOSIS — I6319 Cerebral infarction due to embolism of other precerebral artery: Secondary | ICD-10-CM | POA: Diagnosis not present

## 2022-08-18 DIAGNOSIS — D75839 Thrombocytosis, unspecified: Secondary | ICD-10-CM | POA: Diagnosis present

## 2022-08-18 DIAGNOSIS — I2699 Other pulmonary embolism without acute cor pulmonale: Secondary | ICD-10-CM | POA: Diagnosis present

## 2022-08-18 DIAGNOSIS — G8102 Flaccid hemiplegia affecting left dominant side: Secondary | ICD-10-CM | POA: Diagnosis not present

## 2022-08-18 DIAGNOSIS — I6310 Cerebral infarction due to embolism of unspecified precerebral artery: Secondary | ICD-10-CM | POA: Diagnosis not present

## 2022-08-18 DIAGNOSIS — I6381 Other cerebral infarction due to occlusion or stenosis of small artery: Secondary | ICD-10-CM | POA: Diagnosis not present

## 2022-08-18 DIAGNOSIS — Z86711 Personal history of pulmonary embolism: Secondary | ICD-10-CM

## 2022-08-18 DIAGNOSIS — C172 Malignant neoplasm of ileum: Secondary | ICD-10-CM | POA: Diagnosis not present

## 2022-08-18 DIAGNOSIS — R410 Disorientation, unspecified: Secondary | ICD-10-CM | POA: Diagnosis not present

## 2022-08-18 DIAGNOSIS — Z7951 Long term (current) use of inhaled steroids: Secondary | ICD-10-CM

## 2022-08-18 DIAGNOSIS — Z882 Allergy status to sulfonamides status: Secondary | ICD-10-CM

## 2022-08-18 DIAGNOSIS — K650 Generalized (acute) peritonitis: Secondary | ICD-10-CM | POA: Diagnosis not present

## 2022-08-18 DIAGNOSIS — D735 Infarction of spleen: Secondary | ICD-10-CM | POA: Diagnosis not present

## 2022-08-18 DIAGNOSIS — G936 Cerebral edema: Secondary | ICD-10-CM | POA: Diagnosis not present

## 2022-08-18 DIAGNOSIS — R1900 Intra-abdominal and pelvic swelling, mass and lump, unspecified site: Secondary | ICD-10-CM | POA: Diagnosis not present

## 2022-08-18 DIAGNOSIS — K922 Gastrointestinal hemorrhage, unspecified: Secondary | ICD-10-CM | POA: Diagnosis present

## 2022-08-18 DIAGNOSIS — Z5320 Procedure and treatment not carried out because of patient's decision for unspecified reasons: Secondary | ICD-10-CM | POA: Diagnosis present

## 2022-08-18 DIAGNOSIS — K219 Gastro-esophageal reflux disease without esophagitis: Secondary | ICD-10-CM | POA: Diagnosis present

## 2022-08-18 DIAGNOSIS — G9341 Metabolic encephalopathy: Secondary | ICD-10-CM | POA: Diagnosis not present

## 2022-08-18 DIAGNOSIS — N1330 Unspecified hydronephrosis: Secondary | ICD-10-CM | POA: Diagnosis not present

## 2022-08-18 DIAGNOSIS — D62 Acute posthemorrhagic anemia: Secondary | ICD-10-CM | POA: Diagnosis not present

## 2022-08-18 DIAGNOSIS — N136 Pyonephrosis: Secondary | ICD-10-CM | POA: Diagnosis present

## 2022-08-18 DIAGNOSIS — D696 Thrombocytopenia, unspecified: Secondary | ICD-10-CM | POA: Diagnosis not present

## 2022-08-18 DIAGNOSIS — Z7901 Long term (current) use of anticoagulants: Secondary | ICD-10-CM

## 2022-08-18 DIAGNOSIS — A419 Sepsis, unspecified organism: Secondary | ICD-10-CM | POA: Diagnosis not present

## 2022-08-18 DIAGNOSIS — I110 Hypertensive heart disease with heart failure: Secondary | ICD-10-CM | POA: Diagnosis present

## 2022-08-18 DIAGNOSIS — R627 Adult failure to thrive: Secondary | ICD-10-CM | POA: Diagnosis present

## 2022-08-18 DIAGNOSIS — I959 Hypotension, unspecified: Secondary | ICD-10-CM | POA: Diagnosis not present

## 2022-08-18 DIAGNOSIS — Z515 Encounter for palliative care: Secondary | ICD-10-CM | POA: Diagnosis not present

## 2022-08-18 DIAGNOSIS — D649 Anemia, unspecified: Secondary | ICD-10-CM | POA: Diagnosis present

## 2022-08-18 DIAGNOSIS — D473 Essential (hemorrhagic) thrombocythemia: Secondary | ICD-10-CM | POA: Diagnosis present

## 2022-08-18 DIAGNOSIS — B965 Pseudomonas (aeruginosa) (mallei) (pseudomallei) as the cause of diseases classified elsewhere: Secondary | ICD-10-CM | POA: Diagnosis present

## 2022-08-18 DIAGNOSIS — R531 Weakness: Secondary | ICD-10-CM | POA: Diagnosis not present

## 2022-08-18 DIAGNOSIS — I5022 Chronic systolic (congestive) heart failure: Secondary | ICD-10-CM | POA: Diagnosis present

## 2022-08-18 DIAGNOSIS — I3139 Other pericardial effusion (noninflammatory): Secondary | ICD-10-CM | POA: Diagnosis not present

## 2022-08-18 DIAGNOSIS — F05 Delirium due to known physiological condition: Secondary | ICD-10-CM | POA: Diagnosis not present

## 2022-08-18 DIAGNOSIS — Z6829 Body mass index (BMI) 29.0-29.9, adult: Secondary | ICD-10-CM

## 2022-08-18 DIAGNOSIS — R569 Unspecified convulsions: Secondary | ICD-10-CM | POA: Diagnosis not present

## 2022-08-18 DIAGNOSIS — Z789 Other specified health status: Secondary | ICD-10-CM | POA: Diagnosis not present

## 2022-08-18 DIAGNOSIS — K631 Perforation of intestine (nontraumatic): Secondary | ICD-10-CM | POA: Diagnosis not present

## 2022-08-18 DIAGNOSIS — D509 Iron deficiency anemia, unspecified: Secondary | ICD-10-CM | POA: Diagnosis not present

## 2022-08-18 DIAGNOSIS — Z711 Person with feared health complaint in whom no diagnosis is made: Secondary | ICD-10-CM | POA: Diagnosis not present

## 2022-08-18 DIAGNOSIS — K651 Peritoneal abscess: Secondary | ICD-10-CM | POA: Diagnosis present

## 2022-08-18 DIAGNOSIS — E8729 Other acidosis: Secondary | ICD-10-CM | POA: Diagnosis not present

## 2022-08-18 DIAGNOSIS — R1310 Dysphagia, unspecified: Secondary | ICD-10-CM | POA: Diagnosis not present

## 2022-08-18 DIAGNOSIS — C179 Malignant neoplasm of small intestine, unspecified: Secondary | ICD-10-CM | POA: Diagnosis not present

## 2022-08-18 DIAGNOSIS — Z79899 Other long term (current) drug therapy: Secondary | ICD-10-CM

## 2022-08-18 DIAGNOSIS — R188 Other ascites: Secondary | ICD-10-CM | POA: Diagnosis not present

## 2022-08-18 DIAGNOSIS — R29721 NIHSS score 21: Secondary | ICD-10-CM | POA: Diagnosis not present

## 2022-08-18 DIAGNOSIS — C801 Malignant (primary) neoplasm, unspecified: Secondary | ICD-10-CM | POA: Diagnosis not present

## 2022-08-18 DIAGNOSIS — K8020 Calculus of gallbladder without cholecystitis without obstruction: Secondary | ICD-10-CM | POA: Diagnosis not present

## 2022-08-18 DIAGNOSIS — R609 Edema, unspecified: Secondary | ICD-10-CM | POA: Diagnosis not present

## 2022-08-18 DIAGNOSIS — D631 Anemia in chronic kidney disease: Secondary | ICD-10-CM | POA: Diagnosis present

## 2022-08-18 DIAGNOSIS — Z880 Allergy status to penicillin: Secondary | ICD-10-CM

## 2022-08-18 DIAGNOSIS — Z91041 Radiographic dye allergy status: Secondary | ICD-10-CM

## 2022-08-18 DIAGNOSIS — Z9911 Dependence on respirator [ventilator] status: Secondary | ICD-10-CM

## 2022-08-18 DIAGNOSIS — E876 Hypokalemia: Secondary | ICD-10-CM | POA: Diagnosis present

## 2022-08-18 DIAGNOSIS — E440 Moderate protein-calorie malnutrition: Secondary | ICD-10-CM | POA: Diagnosis present

## 2022-08-18 DIAGNOSIS — G8194 Hemiplegia, unspecified affecting left nondominant side: Secondary | ICD-10-CM | POA: Diagnosis not present

## 2022-08-18 DIAGNOSIS — Z888 Allergy status to other drugs, medicaments and biological substances status: Secondary | ICD-10-CM

## 2022-08-18 DIAGNOSIS — N179 Acute kidney failure, unspecified: Secondary | ICD-10-CM | POA: Diagnosis not present

## 2022-08-18 DIAGNOSIS — I6309 Cerebral infarction due to thrombosis of other precerebral artery: Secondary | ICD-10-CM | POA: Diagnosis present

## 2022-08-18 DIAGNOSIS — K6389 Other specified diseases of intestine: Secondary | ICD-10-CM | POA: Diagnosis not present

## 2022-08-18 DIAGNOSIS — J4520 Mild intermittent asthma, uncomplicated: Secondary | ICD-10-CM | POA: Diagnosis present

## 2022-08-18 DIAGNOSIS — E785 Hyperlipidemia, unspecified: Secondary | ICD-10-CM | POA: Diagnosis present

## 2022-08-18 DIAGNOSIS — I6300 Cerebral infarction due to thrombosis of unspecified precerebral artery: Secondary | ICD-10-CM

## 2022-08-18 DIAGNOSIS — I6340 Cerebral infarction due to embolism of unspecified cerebral artery: Secondary | ICD-10-CM | POA: Diagnosis not present

## 2022-08-18 DIAGNOSIS — Z886 Allergy status to analgesic agent status: Secondary | ICD-10-CM

## 2022-08-18 DIAGNOSIS — R310 Gross hematuria: Secondary | ICD-10-CM | POA: Diagnosis present

## 2022-08-18 DIAGNOSIS — I5031 Acute diastolic (congestive) heart failure: Secondary | ICD-10-CM | POA: Diagnosis not present

## 2022-08-18 DIAGNOSIS — E875 Hyperkalemia: Secondary | ICD-10-CM | POA: Diagnosis not present

## 2022-08-18 DIAGNOSIS — R739 Hyperglycemia, unspecified: Secondary | ICD-10-CM | POA: Diagnosis not present

## 2022-08-18 DIAGNOSIS — D638 Anemia in other chronic diseases classified elsewhere: Secondary | ICD-10-CM | POA: Diagnosis not present

## 2022-08-18 DIAGNOSIS — J9601 Acute respiratory failure with hypoxia: Secondary | ICD-10-CM | POA: Diagnosis not present

## 2022-08-18 DIAGNOSIS — Z87891 Personal history of nicotine dependence: Secondary | ICD-10-CM

## 2022-08-18 DIAGNOSIS — Z66 Do not resuscitate: Secondary | ICD-10-CM | POA: Diagnosis not present

## 2022-08-18 DIAGNOSIS — K5669 Other partial intestinal obstruction: Secondary | ICD-10-CM | POA: Diagnosis present

## 2022-08-18 DIAGNOSIS — R4182 Altered mental status, unspecified: Secondary | ICD-10-CM | POA: Diagnosis not present

## 2022-08-18 DIAGNOSIS — I509 Heart failure, unspecified: Secondary | ICD-10-CM | POA: Diagnosis not present

## 2022-08-18 DIAGNOSIS — R6521 Severe sepsis with septic shock: Secondary | ICD-10-CM | POA: Diagnosis not present

## 2022-08-18 DIAGNOSIS — Z7189 Other specified counseling: Secondary | ICD-10-CM | POA: Diagnosis not present

## 2022-08-18 DIAGNOSIS — K620 Anal polyp: Secondary | ICD-10-CM | POA: Diagnosis not present

## 2022-08-18 DIAGNOSIS — R194 Change in bowel habit: Secondary | ICD-10-CM

## 2022-08-18 DIAGNOSIS — F32A Depression, unspecified: Secondary | ICD-10-CM | POA: Diagnosis present

## 2022-08-18 DIAGNOSIS — J4489 Other specified chronic obstructive pulmonary disease: Secondary | ICD-10-CM | POA: Diagnosis present

## 2022-08-18 DIAGNOSIS — R6520 Severe sepsis without septic shock: Secondary | ICD-10-CM | POA: Diagnosis not present

## 2022-08-18 DIAGNOSIS — G9349 Other encephalopathy: Secondary | ICD-10-CM | POA: Diagnosis not present

## 2022-08-18 DIAGNOSIS — D75838 Other thrombocytosis: Secondary | ICD-10-CM | POA: Diagnosis present

## 2022-08-18 DIAGNOSIS — R935 Abnormal findings on diagnostic imaging of other abdominal regions, including retroperitoneum: Secondary | ICD-10-CM

## 2022-08-18 DIAGNOSIS — N390 Urinary tract infection, site not specified: Secondary | ICD-10-CM | POA: Diagnosis not present

## 2022-08-18 DIAGNOSIS — J9811 Atelectasis: Secondary | ICD-10-CM | POA: Diagnosis present

## 2022-08-18 DIAGNOSIS — K828 Other specified diseases of gallbladder: Secondary | ICD-10-CM | POA: Diagnosis present

## 2022-08-18 DIAGNOSIS — A4189 Other specified sepsis: Secondary | ICD-10-CM | POA: Diagnosis not present

## 2022-08-18 DIAGNOSIS — F419 Anxiety disorder, unspecified: Secondary | ICD-10-CM | POA: Diagnosis present

## 2022-08-18 DIAGNOSIS — I639 Cerebral infarction, unspecified: Secondary | ICD-10-CM | POA: Diagnosis not present

## 2022-08-18 DIAGNOSIS — J449 Chronic obstructive pulmonary disease, unspecified: Secondary | ICD-10-CM | POA: Diagnosis not present

## 2022-08-18 DIAGNOSIS — I6389 Other cerebral infarction: Secondary | ICD-10-CM | POA: Diagnosis not present

## 2022-08-18 DIAGNOSIS — R414 Neurologic neglect syndrome: Secondary | ICD-10-CM | POA: Diagnosis not present

## 2022-08-18 DIAGNOSIS — R933 Abnormal findings on diagnostic imaging of other parts of digestive tract: Secondary | ICD-10-CM

## 2022-08-18 DIAGNOSIS — G9340 Encephalopathy, unspecified: Secondary | ICD-10-CM

## 2022-08-18 LAB — TYPE AND SCREEN
ABO/RH(D): A POS
Antibody Screen: NEGATIVE

## 2022-08-18 LAB — POC OCCULT BLOOD, ED: Fecal Occult Bld: POSITIVE — AB

## 2022-08-18 LAB — RETICULOCYTES
Immature Retic Fract: 34.5 % — ABNORMAL HIGH (ref 2.3–15.9)
RBC.: 1.53 MIL/uL — ABNORMAL LOW (ref 3.87–5.11)
Retic Count, Absolute: 42.4 10*3/uL (ref 19.0–186.0)
Retic Ct Pct: 2.8 % (ref 0.4–3.1)

## 2022-08-18 LAB — BASIC METABOLIC PANEL
Anion gap: 11 (ref 5–15)
BUN: 14 mg/dL (ref 8–23)
CO2: 21 mmol/L — ABNORMAL LOW (ref 22–32)
Calcium: 8.4 mg/dL — ABNORMAL LOW (ref 8.9–10.3)
Chloride: 105 mmol/L (ref 98–111)
Creatinine, Ser: 0.84 mg/dL (ref 0.44–1.00)
GFR, Estimated: >60 mL/min (ref >60)
Glucose, Bld: 118 mg/dL — ABNORMAL HIGH (ref 70–99)
Potassium: 2.8 mmol/L — ABNORMAL LOW (ref 3.5–5.1)
Sodium: 137 mmol/L (ref 135–145)

## 2022-08-18 LAB — BPAM RBC
Unit Type and Rh: 6200
Unit Type and Rh: 6200

## 2022-08-18 LAB — HEMOGLOBIN AND HEMATOCRIT, BLOOD
HCT: 23.5 % — ABNORMAL LOW (ref 36.0–46.0)
Hemoglobin: 7.1 g/dL — ABNORMAL LOW (ref 12.0–15.0)

## 2022-08-18 LAB — PREPARE RBC (CROSSMATCH)

## 2022-08-18 LAB — TSH: TSH: 1.446 u[IU]/mL (ref 0.350–4.500)

## 2022-08-18 LAB — CBC
RBC: 2.59 MIL/uL — ABNORMAL LOW (ref 3.87–5.11)
WBC: 14.4 10*3/uL — ABNORMAL HIGH (ref 4.0–10.5)

## 2022-08-18 LAB — CBG MONITORING, ED: Glucose-Capillary: 106 mg/dL — ABNORMAL HIGH (ref 70–99)

## 2022-08-18 LAB — MAGNESIUM: Magnesium: 2 mg/dL (ref 1.7–2.4)

## 2022-08-18 LAB — T4, FREE: Free T4: 1.24 ng/dL — ABNORMAL HIGH (ref 0.61–1.12)

## 2022-08-18 MED ORDER — SODIUM CHLORIDE 0.9% IV SOLUTION: Freq: Once | INTRAVENOUS | Status: DC

## 2022-08-18 MED ORDER — LORAZEPAM 1 MG PO TABS: 0.5000 mg | ORAL_TABLET | Freq: Four times a day (QID) | ORAL | Status: DC | PRN

## 2022-08-18 MED ORDER — ALBUTEROL SULFATE (2.5 MG/3ML) 0.083% IN NEBU: 2.5000 mg | INHALATION_SOLUTION | Freq: Four times a day (QID) | RESPIRATORY_TRACT | Status: DC | PRN

## 2022-08-18 MED ORDER — ONDANSETRON HCL 4 MG PO TABS: 4.0000 mg | ORAL_TABLET | Freq: Four times a day (QID) | ORAL | Status: DC | PRN

## 2022-08-18 MED ORDER — ACETAMINOPHEN 650 MG RE SUPP
650.0000 mg | Freq: Four times a day (QID) | RECTAL | Status: DC | PRN
  Administered 2022-08-24: 650 mg via RECTAL
  Filled 2022-08-18: qty 1

## 2022-08-18 MED ORDER — HYDROMORPHONE HCL 1 MG/ML IJ SOLN
0.5000 mg | INTRAMUSCULAR | Status: DC | PRN
  Administered 2022-08-21 – 2022-08-22 (×2): 1 mg via INTRAVENOUS
  Filled 2022-08-18 (×2): qty 1

## 2022-08-18 MED ORDER — SERTRALINE HCL 50 MG PO TABS
150.0000 mg | ORAL_TABLET | Freq: Every morning | ORAL | Status: DC
  Administered 2022-08-18 – 2022-08-22 (×4): 150 mg via ORAL
  Filled 2022-08-18 (×5): qty 1

## 2022-08-18 MED ORDER — PANTOPRAZOLE SODIUM 40 MG IV SOLR
40.0000 mg | Freq: Two times a day (BID) | INTRAVENOUS | Status: DC
  Administered 2022-08-18 – 2022-09-16 (×58): 40 mg via INTRAVENOUS
  Filled 2022-08-18 (×58): qty 10

## 2022-08-18 MED ORDER — HYDRALAZINE HCL 20 MG/ML IJ SOLN: 5.0000 mg | Freq: Four times a day (QID) | INTRAMUSCULAR | Status: DC | PRN

## 2022-08-18 MED ORDER — FAMOTIDINE IN NACL 20-0.9 MG/50ML-% IV SOLN
20.0000 mg | Freq: Once | INTRAVENOUS | Status: AC
  Administered 2022-08-18: 20 mg via INTRAVENOUS
  Filled 2022-08-18: qty 50

## 2022-08-18 MED ORDER — ONDANSETRON HCL 4 MG/2ML IJ SOLN
4.0000 mg | Freq: Four times a day (QID) | INTRAMUSCULAR | Status: DC | PRN
  Administered 2022-08-22 – 2022-09-13 (×9): 4 mg via INTRAVENOUS
  Filled 2022-08-18 (×10): qty 2

## 2022-08-18 MED ORDER — POTASSIUM CHLORIDE CRYS ER 20 MEQ PO TBCR
40.0000 meq | EXTENDED_RELEASE_TABLET | ORAL | Status: AC
  Administered 2022-08-18 (×2): 40 meq via ORAL
  Filled 2022-08-18 (×2): qty 2

## 2022-08-18 MED ORDER — MOMETASONE FURO-FORMOTEROL FUM 200-5 MCG/ACT IN AERO
2.0000 | INHALATION_SPRAY | Freq: Two times a day (BID) | RESPIRATORY_TRACT | Status: DC
  Administered 2022-08-19 (×2): 2 via RESPIRATORY_TRACT
  Filled 2022-08-18 (×2): qty 8.8

## 2022-08-18 MED ORDER — ACETAMINOPHEN 325 MG PO TABS: 650.0000 mg | ORAL_TABLET | Freq: Four times a day (QID) | ORAL | Status: DC | PRN

## 2022-08-18 NOTE — H&P (Addendum)
History and Physical    Debra Sharp ZOX:[id number] DOB: Feb 08, 1951 DOA: 08/18/2022  PCP: Clayborn Heron, MD (Confirm with patient/family/NH records and if not entered, this has to be entered at [facility] point of entry) Patient coming from: Home  I have personally briefly reviewed patient's old medical records in [facility]  Chief Complaint: Feeling weak, abdominal pain  HPI: Debra Sharp is a 72 y.o. female with medical history significant of iron deficiency anemia, HTN, HLD, thrombocytopenia presented with worsening of generalized weakness, and new onset of abdominal pain and nauseous vomiting.  Patient was diagnosed with iron deficiency anemia in March 2024, received PRBC and discharged home with p.o. iron supplement.  Starting 3 to 4 weeks ago, patient developed intermittent abdominal pain cramping-like, around periumbilical area, associate with nausea but no vomiting.  She usually use heat/ice pack applied to the area locally and the pain subsided in 1 to 2 hours.  She occasionally had night pain as well, she treated the same way.  2 days ago she started to have strong feeling nauseous vomiting, intermittent, 1-2 times a day, with clear stomach liquid none bile nonbloody none coffee-ground stuff.  She denies any dark-colored stool.  On last admission in March, she lost 15 pounds compared to last year, but her weight remains the same compared to March this time.  Lately she also started to feel generalized weakness and exertional dyspnea but no cough no chest pains no headache.  She denies any numbness weakness of any of the limbs no speech problem in the past few weeks.  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    Past  Medical History:  Diagnosis Date   Anemia    Anxiety    Arthritis    Asthma    Blood transfusion without reported diagnosis    Cataract    removed bilateral   COPD (chronic obstructive pulmonary disease) (HCC)    Depression    GERD (gastroesophageal reflux disease)    Hyperlipidemia    Hypertension     Past Surgical History:  Procedure Laterality Date   APPENDECTOMY     cataracts Bilateral    KNEE SURGERY     NASAL SINUS SURGERY       reports that she has quit smoking. Her smoking use included cigarettes. She has never used smokeless tobacco. She reports that she does not drink alcohol and does not use drugs.  Allergies  Allergen Reactions   Aspirin Other (See Comments)    Caused an asthma attack   Ibuprofen Swelling and Other (See Comments)    Angioedema and Asthma exacerbation   Sulfonamide Derivatives Swelling and Other (See Comments)    Face swelled SEVERELY   Penicillins Rash    Family History  Problem Relation Age of Onset   Colon cancer Neg Hx    Colon polyps Neg Hx    Crohn's disease Neg Hx    Esophageal cancer Neg Hx    Rectal cancer Neg Hx    Stomach cancer Neg Hx    Ulcerative colitis Neg Hx      Prior to Admission medications   Medication Sig Start Date End Date Taking? Authorizing Provider  cetirizine (ZYRTEC) 10 MG tablet Take 1 tablet (10 mg total) by  mouth daily. Patient taking differently: Take 10 mg by mouth daily as needed for allergies or rhinitis. 10/09/19   Junie Spencer, FNP  fluticasone (FLONASE) 50 MCG/ACT nasal spray Place 2 sprays into both nostrils daily. Patient taking differently: Place 2 sprays into both nostrils at bedtime as needed for allergies or rhinitis. 10/09/19   Junie Spencer, FNP  furosemide (LASIX) 20 MG tablet Take 1 tablet (20 mg total) by mouth daily. Patient not taking: Reported on 05/13/2022 04/26/22   Arrien, York Ram, MD  lisinopril (ZESTRIL) 40 MG tablet Take 40 mg by mouth in the morning.    [provider]  lovastatin (MEVACOR) 20 MG tablet Take 20 mg by mouth at bedtime.    [provider]  predniSONE (STERAPRED UNI-PAK 21 TAB) 10 MG (21) TBPK tablet 6 tabs for 1 day, then 5 tabs for 1 das, then 4 tabs for 1 day, then 3 tabs for 1 day, 2 tabs for 1 day, then 1 tab for 1 day Patient not taking: Reported on 04/24/2022 04/09/19   Janace Aris, NP  PROAIR HFA 108 (90 Base) MCG/ACT inhaler Inhale 2 puffs into the lungs every 6 (six) hours as needed for wheezing or shortness of breath.    [provider]  sertraline (ZOLOFT) 100 MG tablet Take 150 mg by mouth in the morning.    [provider]  SYMBICORT 160-4.5 MCG/ACT inhaler Inhale 2 puffs into the lungs in the morning and at bedtime.    [provider]    Physical Exam: Vitals:   08/18/22 1545 08/18/22 1600 08/18/22 1610 08/18/22 1615  BP: (!) 145/65 138/63 (!) 148/66 (!) 155/61  Pulse: 81 83 80 83  Resp: 11 17 18 12   Temp:   98 F (36.7 C)   TempSrc:      SpO2: 100% 100% 99% 100%  Weight:      Height:        Constitutional: NAD, calm, comfortable Vitals:   08/18/22 1545 08/18/22 1600 08/18/22 1610 08/18/22 1615  BP: (!) 145/65 138/63 (!) 148/66 (!) 155/61  Pulse: 81 83 80 83  Resp: 11 17 18 12   Temp:   98 F (36.7 C)   TempSrc:      SpO2: 100% 100% 99% 100%  Weight:      Height:       Eyes: PERRL, lids and conjunctivae normal ENMT: Mucous membranes are moist. Posterior pharynx clear of any exudate or lesions.Normal dentition.  Neck: normal, supple, no masses, no thyromegaly Respiratory: clear to auscultation bilaterally, no wheezing, no crackles. Normal respiratory effort. No accessory muscle use.  Cardiovascular: Regular rate and rhythm, no murmurs / rubs / gallops. No extremity edema. 2+ pedal pulses. No carotid bruits.  Abdomen: no tenderness, no masses palpated. No hepatosplenomegaly. Bowel sounds positive.  Musculoskeletal: no clubbing / cyanosis. No joint deformity upper  and lower extremities. Good ROM, no contractures. Normal muscle tone.  Skin: no rashes, lesions, ulcers. No induration Neurologic: CN 2-12 grossly intact. Sensation intact, DTR normal. Strength 5/5 in all 4.  Psychiatric: Normal judgment and insight. Alert and oriented x 3. Normal mood.    Labs on Admission: I have personally reviewed following labs and imaging studies  CBC: Recent Labs  Lab 08/18/22 0946  WBC 14.4*  HGB 4.6*  HCT 17.7*  MCV 68.3*  PLT 1,255*   Basic Metabolic Panel: Recent Labs  Lab 08/18/22 0946  NA 137  K 2.8*  CL 105  CO2  21*  GLUCOSE 118*  BUN 14  CREATININE 0.84  CALCIUM 8.4*   GFR: Estimated Creatinine Clearance: 65.3 mL/min (by C-G formula based on SCr of 0.84 mg/dL). Liver Function Tests: No results for input(s): "AST", "ALT", "ALKPHOS", "BILITOT", "PROT", "ALBUMIN" in the last 168 hours. No results for input(s): "LIPASE", "AMYLASE" in the last 168 hours. No results for input(s): "AMMONIA" in the last 168 hours. Coagulation Profile: No results for input(s): "INR", "PROTIME" in the last 168 hours. Cardiac Enzymes: No results for input(s): "CKTOTAL", "CKMB", "CKMBINDEX", "TROPONINI" in the last 168 hours. BNP (last 3 results) No results for input(s): "PROBNP" in the last 8760 hours. HbA1C: No results for input(s): "HGBA1C" in the last 72 hours. CBG: Recent Labs  Lab 08/18/22 1010  GLUCAP 106*   Lipid Profile: No results for input(s): "CHOL", "HDL", "LDLCALC", "TRIG", "CHOLHDL", "LDLDIRECT" in the last 72 hours. Thyroid Function Tests: Recent Labs    08/18/22 1103  TSH 1.446  FREET4 1.24*   Anemia Panel: Recent Labs    08/18/22 1103  RETICCTPCT 2.8   Urine analysis:    Component Value Date/Time   COLORURINE YELLOW 04/24/2022 1406   APPEARANCEUR CLEAR 04/24/2022 1406   LABSPEC 1.011 04/24/2022 1406   PHURINE 6.0 04/24/2022 1406   GLUCOSEU NEGATIVE 04/24/2022 1406   HGBUR NEGATIVE 04/24/2022 1406   HGBUR negative  11/30/2008 1120   BILIRUBINUR NEGATIVE 04/24/2022 1406   KETONESUR NEGATIVE 04/24/2022 1406   PROTEINUR NEGATIVE 04/24/2022 1406   UROBILINOGEN 1.0 10/09/2009 2224   NITRITE NEGATIVE 04/24/2022 1406   LEUKOCYTESUR NEGATIVE 04/24/2022 1406    Radiological Exams on Admission: CT Head Wo Contrast  Result Date: 08/18/2022 CLINICAL DATA:  Weakness since March, worsening over the last week. Decreased mobility. EXAM: CT HEAD WITHOUT CONTRAST TECHNIQUE: Contiguous axial images were obtained from the base of the skull through the vertex without intravenous contrast. RADIATION DOSE REDUCTION: This exam was performed according to the departmental dose-optimization program which includes automated exposure control, adjustment of the mA and/or kV according to patient size and/or use of iterative reconstruction technique. COMPARISON:  None Available. FINDINGS: Brain: There is no acute intracranial hemorrhage, extra-axial fluid collection, or acute territorial infarct. Parenchymal volume is normal for age. The ventricles are normal in size. Gray-white differentiation is preserved. There is mild background chronic small-vessel ischemic change. There an age-indeterminate but favored remote small infarct in the genu of the corpus callosum. The pituitary and suprasellar region are normal. There is no mass lesion. There is no mass effect or midline shift. Vascular: No hyperdense vessel or unexpected calcification. Skull: Normal. Negative for fracture or focal lesion. Sinuses/Orbits: There is extensive chronic pansinusitis with layering fluid in the maxillary and sphenoid sinuses. Bilateral lens implants are in place. The globes and orbits are otherwise unremarkable. Other: The mastoid air cells and middle ear cavities are clear. IMPRESSION: 1. 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 No other evidence of acute intracranial pathology. 2. Extensive chronic pansinusitis with possible  superimposed acute sinusitis. Electronically Signed   By: Lesia Hausen M.D.   On: 08/18/2022 12:12    EKG: Independently reviewed.  Sinus tachycardia, no acute ST changes.  Assessment/Plan Active Problems:   * No active hospital problems. *  (please populate well all problems here in Problem List. (For example, if patient is on BP meds at home and you resume or decide to hold them, it is a problem that needs to be her. Same for CAD, COPD, HLD and  so on)  Acute on chronic iron deficiency/blood loss anemia -Suspect upper GI bleed, especially with new onset of intermittent abdominal pain.  Discussed with [facility] Dr. Meridee Score, who recommend starting PPI, agreed with transfusion, GI also recommended abdominal ultrasound to rule out cirrhosis, keep patient n.p.o. after midnight.  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 -Patient intolerant to ASA (causing severe asthma) -Will order brain MRI, may need to consider antiplatelet once GI bleed is worked up.  Essential thrombocythemia -Was worked up on last admission in March, JAK2 study negative.  Likely reactive from severe iron deficiency anemia.  Plan to treat iron deficiency anemia/GI bleed then reevaluate.  Hypokalemia -P.o. replacement, recheck level tomorrow -Check magnesium level  HTN -Hold off home BP meds, start as needed hydralazine  Mild intermittent asthma -Stable  Unintentional weight loss -Along with chronic GI bleed,, rule out GI malignancy  DVT prophylaxis: SCD Code Status: Full code Family Communication: None at bedside Disposition Plan: Expect more than 2 midnight hospital stay, patient came with recurrent GI bleed and anemia requiring PRBC and inpatient GI consultation Consults called: [facility] Admission status: Telemetry admission   Emeline General MD [facility] Pager  [phone]  08/18/2022, 4:57 PM

## 2022-08-18 NOTE — ED Provider Notes (Signed)
[place] EMERGENCY DEPARTMENT AT [facility] Provider Note   CSN: [id number] Arrival date & time: 08/18/22  4098     History  Chief Complaint  Patient presents with   Weakness    Debra Sharp is a 72 y.o. female who presents to the ED with 1 week history of generalized weakness that is progressively worsening.  Weakness is worse with exertion and improves with rest. She denies any fevers, sick contacts, headaches or visual changes.. She endorses nighttime nausea and vomiting for the past 3 days, but denies blood in the vomit.  Also endorses dysuria and decreased urinary output which. She reports sensation of bladder dropping in the abdomen  Per patient, similar episode happened in March 2024 and she was found to have a hemoglobin of 5.  Patient was advised to follow-up with oncology outpatient but she has not followed up yet.     Home Medications Prior to Admission medications   Medication Sig Start Date End Date Taking? Authorizing Provider  cetirizine (ZYRTEC) 10 MG tablet Take 1 tablet (10 mg total) by mouth daily. Patient taking differently: Take 10 mg by mouth daily as needed for allergies or rhinitis. 10/09/19   Junie Spencer, FNP  fluticasone (FLONASE) 50 MCG/ACT nasal spray Place 2 sprays into both nostrils daily. Patient taking differently: Place 2 sprays into both nostrils at bedtime as needed for allergies or rhinitis. 10/09/19   Junie Spencer, FNP  furosemide (LASIX) 20 MG tablet Take 1 tablet (20 mg total) by mouth daily. Patient not taking: Reported on 05/13/2022 04/26/22   Arrien, York Ram, MD  lisinopril (ZESTRIL) 40 MG tablet Take 40 mg by mouth in the morning.    [provider]  lovastatin (MEVACOR) 20 MG tablet Take 20 mg by mouth at bedtime.    [provider]  predniSONE (STERAPRED UNI-PAK 21 TAB) 10 MG (21) TBPK tablet 6 tabs for 1 day, then 5 tabs for 1 das, then 4 tabs for 1 day, then 3 tabs for 1 day, 2 tabs for 1 day,  then 1 tab for 1 day Patient not taking: Reported on 04/24/2022 04/09/19   Janace Aris, NP  PROAIR HFA 108 (90 Base) MCG/ACT inhaler Inhale 2 puffs into the lungs every 6 (six) hours as needed for wheezing or shortness of breath.    [provider]  sertraline (ZOLOFT) 100 MG tablet Take 150 mg by mouth in the morning.    [provider]  SYMBICORT 160-4.5 MCG/ACT inhaler Inhale 2 puffs into the lungs in the morning and at bedtime.    [provider]      Allergies    Aspirin, Ibuprofen, Sulfonamide derivatives, and Penicillins    Review of Systems   Review of Systems  Neurological:  Positive for weakness.    Physical Exam Updated Vital Signs BP (!) 155/61   Pulse 83   Temp 98 F (36.7 C)   Resp 12   Ht 5\' 6"  (1.676 m)   Wt 77.1 kg   SpO2 100%   BMI 27.43 kg/m  Physical Exam General: Well-appearing, not in acute distress CV: RRR. No murmurs, rubs, or gallops. No LE edema Pulmonary: Lungs CTAB. Normal effort. No wheezing or rales. Abdominal: Soft, nontender, nondistended. Normal bowel sounds. Extremities: Palpable pulses. Normal ROM. Skin: Warm and dry. No obvious rash or lesions. Neuro: A&Ox3. Moves all extremities. Normal sensation. No focal deficit.  ED Results / Procedures / Treatments   Labs (all labs ordered  are listed, but only abnormal results are displayed) Labs Reviewed  BASIC METABOLIC PANEL - Abnormal; Notable for the following components:      Result Value   Potassium 2.8 (*)    CO2 21 (*)    Glucose, Bld 118 (*)    Calcium 8.4 (*)    All other components within normal limits  CBC - Abnormal; Notable for the following components:   WBC 14.4 (*)    RBC 2.59 (*)    Hemoglobin 4.6 (*)    HCT 17.7 (*)    MCV 68.3 (*)    MCH 17.8 (*)    MCHC 26.0 (*)    RDW 18.7 (*)    Platelets 1,255 (*)    All other components within normal limits  RETICULOCYTES - Abnormal; Notable for the following components:   RBC. 1.53 (*)    Immature  Retic Fract 34.5 (*)    All other components within normal limits  T4, FREE - Abnormal; Notable for the following components:   Free T4 1.24 (*)    All other components within normal limits  CBG MONITORING, ED - Abnormal; Notable for the following components:   Glucose-Capillary 106 (*)    All other components within normal limits  POC OCCULT BLOOD, ED - Abnormal; Notable for the following components:   Fecal Occult Bld POSITIVE (*)    All other components within normal limits  TSH  URINALYSIS, ROUTINE W REFLEX MICROSCOPIC  TYPE AND SCREEN  PREPARE RBC (CROSSMATCH)    EKG EKG Interpretation Date/Time:  Tuesday August 18 2022 09:45:39 EDT Ventricular Rate:  114 PR Interval:  138 QRS Duration:  92 QT Interval:  367 QTC Calculation: 506 R Axis:   -2  Text Interpretation: Sinus tachycardia Atrial premature complex Low voltage, precordial leads ST-t wave abnormality Abnormal ECG Confirmed by Gerhard Munch [phone]) on 08/18/2022 10:25:48 AM  Radiology CT Head Wo Contrast  Result Date: 08/18/2022 CLINICAL DATA:  Weakness since March, worsening over the last week. Decreased mobility. EXAM: CT HEAD WITHOUT CONTRAST TECHNIQUE: Contiguous axial images were obtained from the base of the skull through the vertex without intravenous contrast. RADIATION DOSE REDUCTION: This exam was performed according to the departmental dose-optimization program which includes automated exposure control, adjustment of the mA and/or kV according to patient size and/or use of iterative reconstruction technique. COMPARISON:  None Available. FINDINGS: Brain: There is no acute intracranial hemorrhage, extra-axial fluid collection, or acute territorial infarct. Parenchymal volume is normal for age. The ventricles are normal in size. Gray-white differentiation is preserved. There is mild background chronic small-vessel ischemic change. There an age-indeterminate but favored remote small infarct in the genu of the corpus  callosum. The pituitary and suprasellar region are normal. There is no mass lesion. There is no mass effect or midline shift. Vascular: No hyperdense vessel or unexpected calcification. Skull: Normal. Negative for fracture or focal lesion. Sinuses/Orbits: There is extensive chronic pansinusitis with layering fluid in the maxillary and sphenoid sinuses. Bilateral lens implants are in place. The globes and orbits are otherwise unremarkable. Other: The mastoid air cells and middle ear cavities are clear. IMPRESSION: 1. 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 No other evidence of acute intracranial pathology. 2. Extensive chronic pansinusitis with possible superimposed acute sinusitis. Electronically Signed   By: Lesia Hausen M.D.   On: 08/18/2022 12:12    Procedures Procedures    Medications Ordered in ED Medications  0.9 %  sodium chloride infusion (Manually  program via Guardrails IV Fluids) (has no administration in time range)  famotidine (PEPCID) IVPB 20 mg premix (0 mg Intravenous Stopped 08/18/22 1619)    ED Course/ Medical Decision Making/ A&P                              Medical Decision Making Amount and/or Complexity of Data Reviewed Labs: ordered. Radiology: ordered.  Risk Prescription drug management.   Patient is a 72 year old lady who presents to the ED with 1 week of progressive generalized weakness.Vital stable, Hb low at 4.6, wbc 14.0 K. Differential diagnosis for generalized weakness includes thyroid dysfunction vs. severe anemia vs. brain occupying lesion.No focal neurologic deficit on exam. CT of the head did not reveal any acute pathology. She was transfused with 2 units of packed red blood cells. EKG shows sinus tachycardia with atrial premature complex. I suspect that generalized weakness is likely secondary to severe anemia. Fecal occult blood positive.  -GI consulted, follow-up recommendation.        Final Clinical  Impression(s) / ED Diagnoses Final diagnoses:  None    Rx / DC Orders ED Discharge Orders     None       Laretta Bolster, M.D.  Internal Medicine Resident, PGY-1 [facility]  Pager: [phone] 4:56 PM, 08/18/2022       Laretta Bolster, MD 08/18/22 1656    Gerhard Munch, MD 08/25/22 [phone]

## 2022-08-18 NOTE — ED Triage Notes (Signed)
Pt c/o generalized weakness since March, but has gotten worse in the last week. Pt states has had decreased mobility. Pt was able to move from wheel chair to stretcher.

## 2022-08-18 NOTE — Progress Notes (Signed)
Pt arrived to 6 north room 8 alert and oriented x4. Pain level 0/10.Marland Kitchen bed in lowest position. Call light in reach. Will continue to monitor pt.

## 2022-08-18 NOTE — ED Notes (Signed)
ED TO INPATIENT HANDOFF REPORT  ED Nurse Name and Phone #: Kylah Maresh [phone]  S Name/Age/Gender Debra Sharp 72 y.o. female Room/Bed: 027C/027C  Code Status   Code Status: Full Code  Home/SNF/Other Home Patient oriented to: self, place, time, and situation Is this baseline? Yes   Triage Complete: Triage complete  Chief Complaint GI bleed [K92.2]  Triage Note Pt c/o generalized weakness since March, but has gotten worse in the last week. Pt states has had decreased mobility. Pt was able to move from wheel chair to stretcher.   Allergies Allergies  Allergen Reactions   Aspirin Other (See Comments)    Caused an asthma attack   Ibuprofen Swelling and Other (See Comments)    Angioedema and Asthma exacerbation   Sulfonamide Derivatives Swelling and Other (See Comments)    Face swelled SEVERELY   Penicillins Rash    Level of Care/Admitting Diagnosis ED Disposition     ED Disposition  Admit   Condition  --   Comment  Hospital Area: [facility] [[id number]]  Level of Care: Telemetry Medical [104]  May admit patient to [facility] or [facility] if equivalent level of care is available:: No  Covid Evaluation: Asymptomatic - no recent exposure (last 10 days) testing not required  Diagnosis: GI bleed [161096]  Admitting Physician: [facility] [[id number]]  Attending Physician: [facility] [[id number]]  Certification:: I certify this patient will need inpatient services for at least 2 midnights  Estimated Length of Stay: 2          B Medical/Surgery History Past Medical History:  Diagnosis Date   Anemia    Anxiety    Arthritis    Asthma    Blood transfusion without reported diagnosis    Cataract    removed bilateral   COPD (chronic obstructive pulmonary disease) (HCC)    Depression    GERD (gastroesophageal reflux disease)    Hyperlipidemia    Hypertension    Past Surgical History:  Procedure Laterality Date   APPENDECTOMY     cataracts  Bilateral    KNEE SURGERY     NASAL SINUS SURGERY       A IV Location/Drains/Wounds Patient Lines/Drains/Airways Status     Active Line/Drains/Airways     Name Placement date Placement time Site Days   Peripheral IV 08/18/22 20 G Left Antecubital 08/18/22  1015  Antecubital  less than 1   Peripheral IV 08/18/22 20 G Anterior;Proximal;Right Forearm 08/18/22  1223  Forearm  less than 1            Intake/Output Last 24 hours No intake or output data in the 24 hours ending 08/18/22 1715  Labs/Imaging Results for orders placed or performed during the hospital encounter of 08/18/22 (from the past 48 hour(s))  Basic metabolic panel     Status: Abnormal   Collection Time: 08/18/22  9:46 AM  Result Value Ref Range   Sodium 137 135 - 145 mmol/L   Potassium 2.8 (L) 3.5 - 5.1 mmol/L   Chloride 105 98 - 111 mmol/L   CO2 21 (L) 22 - 32 mmol/L   Glucose, Bld 118 (H) 70 - 99 mg/dL    Comment: Glucose reference range applies only to samples taken after fasting for at least 8 hours.   BUN 14 8 - 23 mg/dL   Creatinine, Ser 2.95 0.44 - 1.00 mg/dL   Calcium 8.4 (L) 8.9 - 10.3 mg/dL   GFR, Estimated >62 >13 mL/min  Comment: (NOTE) Calculated using the CKD-EPI Creatinine Equation (2021)    Anion gap 11 5 - 15    Comment: Performed at [facility], [address], [place], [place] [address]  CBC     Status: Abnormal   Collection Time: 08/18/22  9:46 AM  Result Value Ref Range   WBC 14.4 (H) 4.0 - 10.5 K/uL   RBC 2.59 (L) 3.87 - 5.11 MIL/uL   Hemoglobin 4.6 (LL) 12.0 - 15.0 g/dL    Comment: THIS CRITICAL RESULT HAS VERIFIED AND BEEN CALLED TO S. Keishon Chavarin, RN BY LEAH KLAR ON 07 02 2024 AT 1044, AND HAS BEEN READ BACK.  CORRECTED ON 07/02 AT 1047: PREVIOUSLY REPORTED AS 4.6 REPEATED TO VERIFY Reticulocyte Hemoglobin testing may be clinically indicated, consider ordering this additional test [id number] THIS CRITICAL RESULT HAS VERIFIED AND BEEN CALLED TO S. Infant Doane BY LEAH  KLAR ON 07 02  2024 AT 1044, AND HAS BEEN READ BACK.     HCT 17.7 (L) 36.0 - 46.0 %   MCV 68.3 (L) 80.0 - 100.0 fL   MCH 17.8 (L) 26.0 - 34.0 pg   MCHC 26.0 (L) 30.0 - 36.0 g/dL   RDW 11.9 (H) 14.7 - 82.9 %   Platelets 1,255 (HH) 150 - 400 K/uL    Comment: THIS CRITICAL RESULT HAS VERIFIED AND BEEN CALLED TO S. Jaydalyn Demattia, RN BY LEAH KLAR ON 07 02 2024 AT 1044, AND HAS BEEN READ BACK.  CORRECTED ON 07/02 AT 1047: PREVIOUSLY REPORTED AS 1255 REPEATED TO VERIFY THIS CRITICAL RESULT HAS VERIFIED AND BEEN CALLED TO S. Khalik Pewitt BY LEAH KLAR ON 07 02 2024 AT 1044, AND HAS BEEN READ BACK.     nRBC 0.0 0.0 - 0.2 %    Comment: Performed at [facility], [address] [place], [place] [address]  CBG monitoring, ED     Status: Abnormal   Collection Time: 08/18/22 10:10 AM  Result Value Ref Range   Glucose-Capillary 106 (H) 70 - 99 mg/dL    Comment: Glucose reference range applies only to samples taken after fasting for at least 8 hours.  Type and screen [facility]     Status: None (Preliminary result)   Collection Time: 08/18/22 10:53 AM  Result Value Ref Range   ABO/RH(D) A POS    Antibody Screen NEG    Sample Expiration 08/21/2022,2359    Unit Number [id number]    Blood Component Type RED CELLS,LR    Unit division 00    Status of Unit ISSUED    Transfusion Status OK TO TRANSFUSE    Crossmatch Result Compatible    Unit Number [id number]    Blood Component Type RED CELLS,LR    Unit division 00    Status of Unit ISSUED    Transfusion Status OK TO TRANSFUSE    Crossmatch Result      Compatible Performed at [facility], [address] [place], [place] [address]   Prepare RBC (crossmatch)     Status: None   Collection Time: 08/18/22 10:53 AM  Result Value Ref Range   Order Confirmation      ORDER PROCESSED BY BLOOD BANK Performed at [facility], [address], [place], [place] [address]   Reticulocytes     Status: Abnormal   Collection Time: 08/18/22 11:03  AM  Result Value Ref Range   Retic Ct Pct 2.8 0.4 - 3.1 %   RBC. 1.53 (L) 3.87 -  5.11 MIL/uL   Retic Count, Absolute 42.4 19.0 - 186.0 K/uL   Immature Retic Fract 34.5 (H) 2.3 - 15.9 %    Comment: Performed at [facility], [address] [place], [place] [address]  TSH     Status: None   Collection Time: 08/18/22 11:03 AM  Result Value Ref Range   TSH 1.446 0.350 - 4.500 uIU/mL    Comment: Performed by a 3rd Generation assay with a functional sensitivity of <=0.01 uIU/mL. Performed at [facility], [address] [place], [place] [address]   T4, free     Status: Abnormal   Collection Time: 08/18/22 11:03 AM  Result Value Ref Range   Free T4 1.24 (H) 0.61 - 1.12 ng/dL    Comment: (NOTE) Biotin ingestion may interfere with free T4 tests. If the results are inconsistent with the TSH level, previous test results, or the clinical presentation, then consider biotin interference. If needed, order repeat testing after stopping biotin. Performed at [facility], [address], [place], [place] [address]   POC occult blood, ED     Status: Abnormal   Collection Time: 08/18/22 12:59 PM  Result Value Ref Range   Fecal Occult Bld POSITIVE (A) NEGATIVE   CT Head Wo Contrast  Result Date: 08/18/2022 CLINICAL DATA:  Weakness since March, worsening over the last week. Decreased mobility. EXAM: CT HEAD WITHOUT CONTRAST TECHNIQUE: Contiguous axial images were obtained from the base of the skull through the vertex without intravenous contrast. RADIATION DOSE REDUCTION: This exam was performed according to the departmental dose-optimization program which includes automated exposure control, adjustment of the mA and/or kV according to patient size and/or use of iterative reconstruction technique. COMPARISON:  None Available. FINDINGS: Brain: There is no acute intracranial hemorrhage, extra-axial fluid collection, or acute territorial infarct. Parenchymal volume is normal for age. The  ventricles are normal in size. Gray-white differentiation is preserved. There is mild background chronic small-vessel ischemic change. There an age-indeterminate but favored remote small infarct in the genu of the corpus callosum. The pituitary and suprasellar region are normal. There is no mass lesion. There is no mass effect or midline shift. Vascular: No hyperdense vessel or unexpected calcification. Skull: Normal. Negative for fracture or focal lesion. Sinuses/Orbits: There is extensive chronic pansinusitis with layering fluid in the maxillary and sphenoid sinuses. Bilateral lens implants are in place. The globes and orbits are otherwise unremarkable. Other: The mastoid air cells and middle ear cavities are clear. IMPRESSION: 1. 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 No other evidence of acute intracranial pathology. 2. Extensive chronic pansinusitis with possible superimposed acute sinusitis. Electronically Signed   By: Lesia Hausen M.D.   On: 08/18/2022 12:12    Pending Labs Unresulted Labs (From admission, onward)     Start     Ordered   08/19/22 0500  CBC  Tomorrow morning,   R        08/18/22 1710   08/19/22 0500  Basic metabolic panel  Tomorrow morning,   R        08/18/22 1710   08/18/22 1712  Magnesium  Add-on,   AD        08/18/22 1711   08/18/22 0946  Urinalysis, Routine w reflex microscopic -Urine, Clean Catch  Once,   URGENT       Question:  Specimen Source  Answer:  Urine, Clean Catch   08/18/22 0945  Vitals/Pain Today's Vitals   08/18/22 1610 08/18/22 1615 08/18/22 1645 08/18/22 1700  BP: (!) 148/66 (!) 155/61 (!) 150/76 (!) 155/65  Pulse: 80 83 78 85  Resp: 18 12 18 16   Temp: 98 F (36.7 C)  98 F (36.7 C)   TempSrc:      SpO2: 99% 100% 98% 100%  Weight:      Height:      PainSc:        Isolation Precautions No active isolations  Medications Medications  0.9 %  sodium chloride infusion (Manually  program via Guardrails IV Fluids) (has no administration in time range)  LORazepam (ATIVAN) tablet 0.5 mg (has no administration in time range)  pantoprazole (PROTONIX) injection 40 mg (has no administration in time range)  hydrALAZINE (APRESOLINE) injection 5 mg (has no administration in time range)  sertraline (ZOLOFT) tablet 150 mg (has no administration in time range)  albuterol (VENTOLIN HFA) 108 (90 Base) MCG/ACT inhaler 2 puff (has no administration in time range)  mometasone-formoterol (DULERA) 200-5 MCG/ACT inhaler 2 puff (has no administration in time range)  acetaminophen (TYLENOL) tablet 650 mg (has no administration in time range)    Or  acetaminophen (TYLENOL) suppository 650 mg (has no administration in time range)  HYDROmorphone (DILAUDID) injection 0.5-1 mg (has no administration in time range)  ondansetron (ZOFRAN) tablet 4 mg (has no administration in time range)    Or  ondansetron (ZOFRAN) injection 4 mg (has no administration in time range)  potassium chloride SA (KLOR-CON M) CR tablet 40 mEq (has no administration in time range)  famotidine (PEPCID) IVPB 20 mg premix (0 mg Intravenous Stopped 08/18/22 1619)    Mobility walks     Focused Assessments Pulmonary Assessment Handoff:  Lung sounds:   Room air      R Recommendations: See Admitting Provider Note  Report given to:   Additional Notes: 2

## 2022-08-19 ENCOUNTER — Inpatient Hospital Stay (HOSPITAL_COMMUNITY): Payer: Medicare Other

## 2022-08-19 DIAGNOSIS — K8020 Calculus of gallbladder without cholecystitis without obstruction: Secondary | ICD-10-CM

## 2022-08-19 DIAGNOSIS — D696 Thrombocytopenia, unspecified: Secondary | ICD-10-CM

## 2022-08-19 DIAGNOSIS — N390 Urinary tract infection, site not specified: Secondary | ICD-10-CM | POA: Diagnosis not present

## 2022-08-19 DIAGNOSIS — N1330 Unspecified hydronephrosis: Secondary | ICD-10-CM

## 2022-08-19 DIAGNOSIS — I639 Cerebral infarction, unspecified: Secondary | ICD-10-CM | POA: Diagnosis not present

## 2022-08-19 DIAGNOSIS — I6389 Other cerebral infarction: Secondary | ICD-10-CM

## 2022-08-19 DIAGNOSIS — D649 Anemia, unspecified: Secondary | ICD-10-CM | POA: Diagnosis not present

## 2022-08-19 DIAGNOSIS — K922 Gastrointestinal hemorrhage, unspecified: Secondary | ICD-10-CM | POA: Diagnosis not present

## 2022-08-19 DIAGNOSIS — D509 Iron deficiency anemia, unspecified: Secondary | ICD-10-CM | POA: Diagnosis not present

## 2022-08-19 LAB — URINALYSIS, ROUTINE W REFLEX MICROSCOPIC
Bilirubin Urine: NEGATIVE
Glucose, UA: NEGATIVE mg/dL
Hgb urine dipstick: NEGATIVE
Ketones, ur: 5 mg/dL — AB
Nitrite: POSITIVE — AB
Protein, ur: 100 mg/dL — AB
Specific Gravity, Urine: 1.031 — ABNORMAL HIGH (ref 1.005–1.030)
WBC, UA: >50 WBC/hpf (ref 0–5)
pH: 5 (ref 5.0–8.0)

## 2022-08-19 LAB — CBC
HCT: 17.7 % — ABNORMAL LOW (ref 36.0–46.0)
HCT: 23.6 % — ABNORMAL LOW (ref 36.0–46.0)
Hemoglobin: 4.6 g/dL — CL (ref 12.0–15.0)
Hemoglobin: 7 g/dL — ABNORMAL LOW (ref 12.0–15.0)
MCH: 17.8 pg — ABNORMAL LOW (ref 26.0–34.0)
MCH: 21.7 pg — ABNORMAL LOW (ref 26.0–34.0)
MCHC: 26 g/dL — ABNORMAL LOW (ref 30.0–36.0)
MCHC: 29.7 g/dL — ABNORMAL LOW (ref 30.0–36.0)
MCV: 68.3 fL — ABNORMAL LOW (ref 80.0–100.0)
MCV: 73.3 fL — ABNORMAL LOW (ref 80.0–100.0)
Platelets: 1255 10*3/uL (ref 150–400)
Platelets: 997 10*3/uL (ref 150–400)
RBC: 3.22 MIL/uL — ABNORMAL LOW (ref 3.87–5.11)
RDW: 18.7 % — ABNORMAL HIGH (ref 11.5–15.5)
RDW: 20.6 % — ABNORMAL HIGH (ref 11.5–15.5)
WBC: 13.9 10*3/uL — ABNORMAL HIGH (ref 4.0–10.5)
nRBC: 0 % (ref 0.0–0.2)
nRBC: 0.1 % (ref 0.0–0.2)

## 2022-08-19 LAB — TYPE AND SCREEN

## 2022-08-19 LAB — BASIC METABOLIC PANEL
Anion gap: 10 (ref 5–15)
BUN: 14 mg/dL (ref 8–23)
CO2: 19 mmol/L — ABNORMAL LOW (ref 22–32)
Calcium: 8.2 mg/dL — ABNORMAL LOW (ref 8.9–10.3)
Chloride: 107 mmol/L (ref 98–111)
Creatinine, Ser: 0.83 mg/dL (ref 0.44–1.00)
GFR, Estimated: >60 mL/min (ref >60)
Glucose, Bld: 106 mg/dL — ABNORMAL HIGH (ref 70–99)
Potassium: 3.4 mmol/L — ABNORMAL LOW (ref 3.5–5.1)
Sodium: 136 mmol/L (ref 135–145)

## 2022-08-19 LAB — BPAM RBC
Blood Product Expiration Date: 202407182359
ISSUE DATE / TIME: 202407021211
Unit Type and Rh: 6200

## 2022-08-19 LAB — LIPID PANEL
Cholesterol: 152 mg/dL (ref 0–200)
HDL: 37 mg/dL — ABNORMAL LOW (ref >40)
LDL Cholesterol: 94 mg/dL (ref 0–99)
Total CHOL/HDL Ratio: 4.1 RATIO
Triglycerides: 103 mg/dL (ref <150)
VLDL: 21 mg/dL (ref 0–40)

## 2022-08-19 LAB — ECHOCARDIOGRAM LIMITED
Height: 66 in
Weight: 2719.59 oz

## 2022-08-19 LAB — HEMOGLOBIN AND HEMATOCRIT, BLOOD
HCT: 28.1 % — ABNORMAL LOW (ref 36.0–46.0)
Hemoglobin: 8.7 g/dL — ABNORMAL LOW (ref 12.0–15.0)

## 2022-08-19 LAB — PREPARE RBC (CROSSMATCH)

## 2022-08-19 MED ORDER — IOHEXOL 350 MG/ML SOLN
75.0000 mL | Freq: Once | INTRAVENOUS | Status: AC | PRN
  Administered 2022-08-19: 75 mL via INTRAVENOUS

## 2022-08-19 MED ORDER — SODIUM CHLORIDE 0.9% IV SOLUTION: Freq: Once | INTRAVENOUS | Status: AC

## 2022-08-19 MED ORDER — SODIUM CHLORIDE 0.9 % IV SOLN
1.0000 g | INTRAVENOUS | Status: DC
  Administered 2022-08-19 – 2022-08-22 (×4): 1 g via INTRAVENOUS
  Filled 2022-08-19 (×4): qty 10

## 2022-08-19 MED ORDER — POTASSIUM CHLORIDE CRYS ER 20 MEQ PO TBCR
40.0000 meq | EXTENDED_RELEASE_TABLET | Freq: Once | ORAL | Status: AC
  Administered 2022-08-19: 40 meq via ORAL
  Filled 2022-08-19: qty 2

## 2022-08-19 NOTE — Progress Notes (Addendum)
[facility] PROGRESS NOTE   Lashante Gibbon WUJ:[id number] DOB: [birth year] DOA: 08/18/2022  PCP: Clayborn Heron, MD  Brief History/Interval Summary: 72 y.o. female with medical history significant of iron deficiency anemia, HTN, HLD, presented with worsening of generalized weakness, and new onset of abdominal pain and vomiting. Patient was diagnosed with iron deficiency anemia in March 2024, received PRBC and discharged home with p.o. iron supplement.  Patient gave history of weight loss.  She has never had GI workup previously.  CT scan also showed incidental age-indeterminate infarct.  She was hospitalized for further management.  Consultants: Gastroenterology.  Neurology  Procedures: None yet    Subjective/Interval History: Patient complains of nausea.  Denies any abdominal pain.  Denies any weakness in any 1 side of her body.  Denies any visual disturbances.  Complains of only generalized weakness and fatigue.    Assessment/Plan:  Acute on chronic iron deficiency anemia There is suspicion for GI blood loss.  Gastroenterology consulted. Patient was given blood transfusion.  Hemoglobin improved to 7.0 from 4.6.  No overt blood loss noted.  Stool for occult blood is noted to be positive.  Nausea Etiology unclear.  Could be due to UTI.  Right upper quadrant ultrasound suggested early signs of cirrhosis of the liver.  Distended gallbladder noted.  No Eulah Pont sign this was noted. Will need to proceed with CT scan but will wait on GI and neurology input first. CT abdomen pelvis raises concern for possible colon mass.  Results communicated to patient.  GI was also notified.  Incidental acute stroke Incidentally noted on CT scan.  MRI shows tiny infarcts.  Patient without any focal neurological deficits.  No history of strokes previously.  Neurology has been consulted.  Cannot give any antiplatelet agents due to concern for GI bleed. Vascular studies will defer to  neurology. There is an echocardiogram report from March which showed EF of 30 to 35%.  Large pericardial effusion was noted at that time.  Wall motion abnormalities were noted.  It looks like patient left AGAINST MEDICAL ADVICE at that time.  Will repeat an echocardiogram to see the status of the pericardial effusion. LDL 94.  Will need statin therapy. HbA1c will be ordered.  Abnormal UA with right-sided hydronephrosis Moderate to severe right-sided hydronephrosis noted on ultrasound.  UA noted to be abnormal.  Patient with fatigue.  Will initiate ceftriaxone.  Will need to do a CT of her abdomen pelvis.  Thrombocythemia This was worked up during previous admission in March.  JAK2 study was negative.  Probably reactive from iron deficiency.  Hypokalemia Will be repleted.  Magnesium was normal.  Essential hypertension Holding antihypertensives for now.  DVT Prophylaxis: SCDs Code Status: Full code Family Communication: Discussed with patient Disposition Plan: To be determined  Status is: Inpatient Remains inpatient appropriate because: Severe anemia, acute stroke, UTI      Medications: Scheduled:  sodium chloride   Intravenous Once   mometasone-formoterol  2 puff Inhalation BID   pantoprazole (PROTONIX) IV  40 mg Intravenous Q12H   sertraline  150 mg Oral q AM   Continuous:  cefTRIAXone (ROCEPHIN)  IV 1 g (08/19/22 0952)   NFA:OZHYQMVHQIONG **OR** acetaminophen, albuterol, hydrALAZINE, HYDROmorphone (DILAUDID) injection, LORazepam, ondansetron **OR** ondansetron (ZOFRAN) IV  Antibiotics: Anti-infectives (From admission, onward)    Start     Dose/Rate Route Frequency Ordered Stop   08/19/22 1030  cefTRIAXone (ROCEPHIN) 1 g in sodium chloride 0.9 % 100 mL IVPB  1 g 200 mL/hr over 30 Minutes Intravenous Every 24 hours 08/19/22 0937         Objective:  Vital Signs  Vitals:   08/19/22 0449 08/19/22 0504 08/19/22 0814 08/19/22 0819  BP:  126/62  133/68  Pulse:   76 76 82  Resp: 16 16 16 17   Temp:  98.4 F (36.9 C)  98.1 F (36.7 C)  TempSrc:    Oral  SpO2:    99%  Weight:      Height:        Intake/Output Summary (Last 24 hours) at 08/19/2022 1000 Last data filed at 08/19/2022 0622 Gross per 24 hour  Intake 487.42 ml  Output --  Net 487.42 ml   Filed Weights   08/18/22 0941  Weight: 77.1 kg    General appearance: Awake alert.  In no distress Resp: Clear to auscultation bilaterally.  Normal effort Cardio: S1-S2 is normal regular.  No S3-S4.  No rubs murmurs or bruit GI: Abdomen is soft.  No specific area of tenderness identified.  No masses organomegaly. Extremities: No edema.  Full range of motion of lower extremities.  Physical deconditioning noted. Neurologic: Alert and oriented x3.  No focal neurological deficits.    Lab Results:  Data Reviewed: I have personally reviewed following labs and reports of the imaging studies  CBC: Recent Labs  Lab 08/18/22 0946 08/18/22 2248 08/19/22 0208  WBC 14.4*  --  13.9*  HGB 4.6* 7.1* 7.0*  HCT 17.7* 23.5* 23.6*  MCV 68.3*  --  73.3*  PLT 1,255*  --  997*    Basic Metabolic Panel: Recent Labs  Lab 08/18/22 0946 08/19/22 0208  NA 137 136  K 2.8* 3.4*  CL 105 107  CO2 21* 19*  GLUCOSE 118* 106*  BUN 14 14  CREATININE 0.84 0.83  CALCIUM 8.4* 8.2*  MG 2.0  --     GFR: Estimated Creatinine Clearance: 66.1 mL/min (by C-G formula based on SCr of 0.83 mg/dL).   CBG: Recent Labs  Lab 08/18/22 1010  GLUCAP 106*    Lipid Profile: Recent Labs    08/19/22 0208  CHOL 152  HDL 37*  LDLCALC 94  TRIG 811  CHOLHDL 4.1    Thyroid Function Tests: Recent Labs    08/18/22 1103  TSH 1.446  FREET4 1.24*    Anemia Panel: Recent Labs    08/18/22 1103  RETICCTPCT 2.8     Radiology Studies: MR BRAIN WO CONTRAST  Result Date: 08/18/2022 CLINICAL DATA:  Follow-up examination for stroke. EXAM: MRI HEAD WITHOUT CONTRAST TECHNIQUE: Multiplanar, multiecho pulse  sequences of the brain and surrounding structures were obtained without intravenous contrast. COMPARISON:  CT from earlier the same day. FINDINGS: Brain: Cerebral volume within normal limits. Scattered patchy T2/FLAIR hyperintensity involving the periventricular deep white matter both cerebral hemispheres, consistent with chronic small vessel ischemic disease, mild-to-moderate in nature. Few scattered punctate foci of diffusion signal abnormality are seen involving the right parietal lobe (series 5, image 87), left frontal centrum semi ovale (series 7, image 64) and right cerebellum (series 5, image 64). Findings suspicious for tiny acute ischemic infarcts. No associated hemorrhage or mass effect. No other evidence for acute or subacute ischemia. Gray-white matter differentiation otherwise maintained. No acute or chronic intracranial blood products. No mass lesion, midline shift or mass effect. No hydrocephalus or extra-axial fluid collection. Pituitary gland and suprasellar region within normal limits. Vascular: Major intracranial vascular flow voids are maintained. Skull and upper cervical spine: Craniocervical  junction within normal limits. Diffuse loss of normal bone marrow signal, nonspecific but can be seen with anemia, smoking, obesity, and infiltrative/myelofibrotic marrow processes. No focal marrow replacing lesion. Sinuses/Orbits: Prior bilateral ocular lens replacement. Scattered mucosal thickening and secretions noted about the frontoethmoidal, sphenoid, and maxillary sinuses. Scattered superimposed air-fluid levels. No mastoid effusion. Other: None. IMPRESSION: 1. Few scattered punctate foci of diffusion signal abnormality involving the right parietal lobe, left frontal centrum semi ovale, and right cerebellum, suspicious for tiny acute ischemic infarcts. No associated hemorrhage or mass effect. 2. Underlying mild to moderate chronic microvascular ischemic disease. Electronically Signed   By: Rise Mu M.D.   On: 08/18/2022 20:24   US Abdomen Limited RUQ (LIVER/GB)  Result Date: 08/18/2022 CLINICAL DATA:  Cirrhosis evaluation EXAM: ULTRASOUND ABDOMEN LIMITED RIGHT UPPER QUADRANT COMPARISON:  None Available. FINDINGS: Gallbladder: Probable small amount of gallbladder sludge. Few echogenic foci with possible dirty shadowing. Negative sonographic Murphy. Common bile duct: Diameter: 3.4 mm Liver: Slightly coarse hepatic echotexture. No focal hepatic abnormality. Portal vein is patent on color Doppler imaging with normal direction of blood flow towards the liver. Other: Incidentally noted is moderate severe right hydronephrosis. Cortical thinning of right kidney IMPRESSION: 1. Slightly coarse hepatic echotexture without focal lesion. Possible subtle surface nodularity on some of the views as may be seen with early changes of cirrhosis. 2. Distended gallbladder, appears to contain sludge and echogenic foci some of which exhibit dirty shadowing, question air containing stones. No wall thickening or Murphy. Suggest correlation with CT to exclude air within the gallbladder. 3. Moderate to severe right hydronephrosis with cortical thinning of right kidney. This may also be assessed at CT follow-up. These results will be called to the ordering clinician or representative by the Radiologist Assistant, and communication documented in the PACS or [employer]. Electronically Signed   By: Jasmine Pang M.D.   On: 08/18/2022 19:42   CT Head Wo Contrast  Result Date: 08/18/2022 CLINICAL DATA:  Weakness since March, worsening over the last week. Decreased mobility. EXAM: CT HEAD WITHOUT CONTRAST TECHNIQUE: Contiguous axial images were obtained from the base of the skull through the vertex without intravenous contrast. RADIATION DOSE REDUCTION: This exam was performed according to the departmental dose-optimization program which includes automated exposure control, adjustment of the mA and/or kV according to  patient size and/or use of iterative reconstruction technique. COMPARISON:  None Available. FINDINGS: Brain: There is no acute intracranial hemorrhage, extra-axial fluid collection, or acute territorial infarct. Parenchymal volume is normal for age. The ventricles are normal in size. Gray-white differentiation is preserved. There is mild background chronic small-vessel ischemic change. There an age-indeterminate but favored remote small infarct in the genu of the corpus callosum. The pituitary and suprasellar region are normal. There is no mass lesion. There is no mass effect or midline shift. Vascular: No hyperdense vessel or unexpected calcification. Skull: Normal. Negative for fracture or focal lesion. Sinuses/Orbits: There is extensive chronic pansinusitis with layering fluid in the maxillary and sphenoid sinuses. Bilateral lens implants are in place. The globes and orbits are otherwise unremarkable. Other: The mastoid air cells and middle ear cavities are clear. IMPRESSION: 1. 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 No other evidence of acute intracranial pathology. 2. Extensive chronic pansinusitis with possible superimposed acute sinusitis. Electronically Signed   By: Lesia Hausen M.D.   On: 08/18/2022 12:12       LOS: 1 day   [employer]  [facility] Pager  on [email or web address]  08/19/2022, 10:00 AM

## 2022-08-19 NOTE — Consult Note (Signed)
Consultation Note   Referring Provider:  [facility] PCP: Clayborn Heron, MD Primary Gastroenterologist: Gentry Fitz. Saw Ileene Patrick, MD ( hospital in March 2024)        Reason for consultation: anemia  DOA: 08/18/2022         Hospital Day: 2   Assessment and Plan   Patient is a 72 y.o. year old female with a past medical history not limited to thrombocytosis, HTN, asthma and anxiety. Presented with persistent profound iron deficiency anemia and heme + stools. Stools black on iron.   Currently Hgb has improved from 4.6 to 7 post transfusion. She has been having nausea / vomiting for 3 days, otherwise no focal GI symptoms. She agrees to inpatient EGD but adamantly refused inpatient colonoscopy ( just as she did during March admission).  -Explained that EGD alone is an incomplete workup of anemia and inpatient colonoscopy could expedite diagnosis but still refuses. EGD could possibly be done tomorrow depending on Neurology evaluation of ischemic infarcts. Clears okay today, NPO after MN .   Nausea and vomiting ( bilious emesis) x 3 days. Starts as reflux leading to vomiting. Unclear etiology. Could be all secondary to acid reflux triggering N/v but other etiologies need to be excluded such as PUD, neoplasm.  -Further evaluation at time of EGD  Chronic thrombocytosis. Previously evaluated by Hematology and JAK2 study was negative. Felt to be reactive. -platelets 997 today  Abnormal head imaging . 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 MRI shows - Few scattered punctate foci of diffusion signal abnormality involving the right parietal lobe, left frontal centrum semi ovale, and right cerebellum, suspicious for tiny acute ischemic infarcts.  -Neurology was consulted.   Moderate to severe R hydronephrosis. -TRH ordered CT AP  Concern for cirrhosis of RUQ [place].  -Outpatient workup is  appropriate  Cholelithiasis, presumably asymptomatic. No abdominal pain    History of Present Illness  Telethia was evaluated by [place] for iron deficiency anemia during a March 2024 admission for symptomatic anemia and FOBT+.  Hgb was in 5 range. She wanted to defer endoscopic evaluation to outpatient setting. She was transfused, hgb improved. She was scheduled for EGD / colonoscopy in April but cancelled. We tried to reschedule her but never could make connect despite leaving her several voice messages.   During above admission patient was evaluated by Hematology for thrombocytosis who felt it was most likely reactive due to Iron deficiency anemia and IV iron was recommended. She didn't follow up with them outpatient.   Interval History:  Patient presented to ED yesterday with weakness .Head CT Incidental finding of age indeterminant  brain infarct. MRI brain suggests tiny acute infarcts.   Hgb 4.6. FOBT+, platelets 997. Renal function normal.  RUQ [place] raises concern for cirrhosis, cholelithiasis and moderate to severe R hydronephrosis.   Kelci hasn't had any overt GI bleeding. No abdominal pain. She has been having reflux , nausea / vomiting for last few days. Bilious emesis. No NSAID use. Her weight is stable. Stools have been black since starting oral iron but BMs otherwise normal. She has no focal GI complaints.  She cancelled endoscopic procedure due to anxiety. She is still not  willing to do an inpatient colonoscopy but will agree to an EGD. No known FMH of celiac disease.    Labs and Imaging: Recent Labs    08/18/22 0946 08/18/22 2248 08/19/22 0208  WBC 14.4*  --  13.9*  HGB 4.6* 7.1* 7.0*  HCT 17.7* 23.5* 23.6*  PLT 1,255*  --  997*   Recent Labs    08/18/22 0946 08/19/22 0208  NA 137 136  K 2.8* 3.4*  CL 105 107  CO2 21* 19*  GLUCOSE 118* 106*  BUN 14 14  CREATININE 0.84 0.83  CALCIUM 8.4* 8.2*   No results for input(s): "PROT", "ALBUMIN", "AST", "ALT", "ALKPHOS",  "BILITOT", "BILIDIR", "IBILI" in the last 72 hours. No results for input(s): "HEPBSAG", "HCVAB", "HEPAIGM", "HEPBIGM" in the last 72 hours. No results for input(s): "LABPROT", "INR" in the last 72 hours.  RUQ [place] 08/18/22 IMPRESSION: 1. Slightly coarse hepatic echotexture without focal lesion. Possible subtle surface nodularity on some of the views as may be seen with early changes of cirrhosis. 2. Distended gallbladder, appears to contain sludge and echogenic foci some of which exhibit dirty shadowing, question air containing stones. No wall thickening or Murphy. Suggest correlation with CT to exclude air within the gallbladder. 3. Moderate to severe right hydronephrosis with cortical thinning of right kidney. This may also be assessed at CT follow-up.   Previous GI Evaluation:  none  Past Medical History:  Diagnosis Date   Anemia    Anxiety    Arthritis    Asthma    Blood transfusion without reported diagnosis    Cataract    removed bilateral   COPD (chronic obstructive pulmonary disease) (HCC)    Depression    GERD (gastroesophageal reflux disease)    Hyperlipidemia    Hypertension     Past Surgical History:  Procedure Laterality Date   APPENDECTOMY     cataracts Bilateral    KNEE SURGERY     NASAL SINUS SURGERY      Family History  Problem Relation Age of Onset   Colon cancer Neg Hx    Colon polyps Neg Hx    Crohn's disease Neg Hx    Esophageal cancer Neg Hx    Rectal cancer Neg Hx    Stomach cancer Neg Hx    Ulcerative colitis Neg Hx     Prior to Admission medications   Medication Sig Start Date End Date Taking? Authorizing Provider  cetirizine (ZYRTEC) 10 MG tablet Take 1 tablet (10 mg total) by mouth daily. Patient taking differently: Take 10 mg by mouth daily as needed for allergies or rhinitis. 10/09/19  Yes Hawks, Christy A, FNP  ferrous sulfate 325 (65 FE) MG EC tablet Take 325 mg by mouth daily.   Yes [provider]  fluticasone (FLONASE)  50 MCG/ACT nasal spray Place 2 sprays into both nostrils daily. Patient taking differently: Place 2 sprays into both nostrils at bedtime as needed for allergies or rhinitis. 10/09/19  Yes Hawks, Christy A, FNP  lisinopril (ZESTRIL) 40 MG tablet Take 40 mg by mouth in the morning.   Yes [provider]  lovastatin (MEVACOR) 20 MG tablet Take 20 mg by mouth at bedtime.   Yes [provider]  PROAIR HFA 108 (90 Base) MCG/ACT inhaler Inhale 2 puffs into the lungs every 6 (six) hours as needed for wheezing or shortness of breath.   Yes [provider]  sertraline (ZOLOFT) 100 MG tablet Take 150 mg by mouth in the morning.  Yes [provider]  SYMBICORT 160-4.5 MCG/ACT inhaler Inhale 2 puffs into the lungs in the morning and at bedtime.   Yes [provider]  furosemide (LASIX) 20 MG tablet Take 1 tablet (20 mg total) by mouth daily. Patient not taking: Reported on 05/13/2022 04/26/22   Arrien, York Ram, MD  predniSONE (STERAPRED UNI-PAK 21 TAB) 10 MG (21) TBPK tablet 6 tabs for 1 day, then 5 tabs for 1 das, then 4 tabs for 1 day, then 3 tabs for 1 day, 2 tabs for 1 day, then 1 tab for 1 day Patient not taking: Reported on 04/24/2022 04/09/19   Janace Aris, NP    Current Facility-Administered Medications  Medication Dose Route Frequency Provider Last Rate Last Admin   0.9 %  sodium chloride infusion (Manually program via Guardrails IV Fluids)   Intravenous Once Gerhard Munch, MD       acetaminophen (TYLENOL) tablet 650 mg  650 mg Oral Q6H PRN Emeline General, MD       Or   acetaminophen (TYLENOL) suppository 650 mg  650 mg Rectal Q6H PRN Mikey College T, MD       albuterol (PROVENTIL) (2.5 MG/3ML) 0.083% nebulizer solution 2.5 mg  2.5 mg Inhalation Q6H PRN Mikey College T, MD       cefTRIAXone (ROCEPHIN) 1 g in sodium chloride 0.9 % 100 mL IVPB  1 g Intravenous Q24H Osvaldo Shipper, MD       hydrALAZINE (APRESOLINE) injection 5 mg  5 mg Intravenous Q6H  PRN Mikey College T, MD       HYDROmorphone (DILAUDID) injection 0.5-1 mg  0.5-1 mg Intravenous Q2H PRN Mikey College T, MD       LORazepam (ATIVAN) tablet 0.5 mg  0.5 mg Oral Q6H PRN Mikey College T, MD       mometasone-formoterol [facility]) 200-5 MCG/ACT inhaler 2 puff  2 puff Inhalation BID Mikey College T, MD   2 puff at 08/19/22 0812   ondansetron (ZOFRAN) tablet 4 mg  4 mg Oral Q6H PRN Mikey College T, MD       Or   ondansetron [facility]) injection 4 mg  4 mg Intravenous Q6H PRN Mikey College T, MD       pantoprazole (PROTONIX) injection 40 mg  40 mg Intravenous Q12H Mikey College T, MD   40 mg at 08/19/22 6578   sertraline (ZOLOFT) tablet 150 mg  150 mg Oral q AM Emeline General, MD   150 mg at 08/18/22 2048    Allergies as of 08/18/2022 - Review Complete 08/18/2022  Allergen Reaction Noted   Aspirin Other (See Comments) 05/11/2015   Ibuprofen Swelling and Other (See Comments) 11/28/2013   Sulfonamide derivatives Swelling and Other (See Comments) 11/28/2013   Penicillins Rash 11/28/2013    Social History   Socioeconomic History   Marital status: Divorced    Spouse name: Not on file   Number of children: Not on file   Years of education: Not on file   Highest education level: Not on file  Occupational History   Not on file  Tobacco Use   Smoking status: Former    Types: Cigarettes   Smokeless tobacco: Never  Vaping Use   Vaping Use: Never used  Substance and Sexual Activity   Alcohol use: No   Drug use: No   Sexual activity: Not on file  Other Topics Concern   Not on file  Social History Narrative   Not on file  Social Determinants of Health   Financial Resource Strain: Not on file  Food Insecurity: No Food Insecurity (04/24/2022)   Hunger Vital Sign    Worried About Running Out of Food in the Last Year: Never true    Ran Out of Food in the Last Year: Never true  Transportation Needs: No Transportation Needs (04/24/2022)   PRAPARE - Administrator, Civil Service  (Medical): No    Lack of Transportation (Non-Medical): No  Physical Activity: Not on file  Stress: Not on file  Social Connections: Not on file  Intimate Partner Violence: Not At Risk (04/24/2022)   Humiliation, Afraid, Rape, and Kick questionnaire    Fear of Current or Ex-Partner: No    Emotionally Abused: No    Physically Abused: No    Sexually Abused: No     Code Status   Code Status: Full Code  Review of Systems: All systems reviewed and negative except where noted in HPI.  Physical Exam: Vital signs in last 24 hours: Temp:  [97.7 F (36.5 C)-98.8 F (37.1 C)] 98.1 F (36.7 C) (07/03 0819) Pulse Rate:  [71-115] 82 (07/03 0819) Resp:  [11-20] 17 (07/03 0819) BP: (126-161)/(51-88) 133/68 (07/03 0819) SpO2:  [97 %-100 %] 99 % (07/03 0819) Weight:  [77.1 kg] 77.1 kg (07/02 0941) Last BM Date : 08/18/22  General:  Pleasant female in NAD Psych:  Cooperative.  Eyes: Pupils equal Ears:  Normal auditory acuity Nose: No deformity, discharge or lesions Neck:  Supple, no masses felt Lungs:  Clear to auscultation.  Heart:  Regular rate, regular rhythm.  Abdomen:  Soft, nondistended, nontender, active bowel sounds, no masses felt Rectal :  Deferred Msk: Symmetrical without gross deformities.  Neurologic:  Alert, oriented, grossly normal neurologically Extremities : No edema Skin:  Intact without significant lesions.    Intake/Output from previous day: 07/02 0701 - 07/03 0700 In: 487.4 [Blood:487.4] Out: -  Intake/Output this shift:  No intake/output data recorded.     Willette Cluster, NP-C @  08/19/2022, 9:40 AM

## 2022-08-19 NOTE — TOC Initial Note (Signed)
Transition of Care [facility]) - Initial/Assessment Note    Patient Details  Name: Debra Sharp MRN: [id number] Date of Birth: [birth year]  [facility]) CM/SW Contact:    Kermit Balo, RN Phone Number: 08/19/2022, 2:58 PM  Clinical Narrative:                 Pt is from home alone. Her son lives next door and can check on her.  Son provides needed transportation. She manages her own medications and denies any issues.  Son will transport home when medically ready.  Expected Discharge Plan: Home/Self Care Barriers to Discharge: Continued Medical Work up   Patient Goals and CMS Choice            Expected Discharge Plan and Services       Living arrangements for the past 2 months: Single Family Home                                      Prior Living Arrangements/Services Living arrangements for the past 2 months: Single Family Home Lives with:: Self Patient language and need for interpreter reviewed:: Yes Do you feel safe going back to the place where you live?: Yes          Current home services: DME (walker/ cane/ shower seat) Criminal Activity/Legal Involvement Pertinent to Current Situation/Hospitalization: No - Comment as needed  Activities of Daily Living      Permission Sought/Granted                  Emotional Assessment Appearance:: Appears stated age Attitude/Demeanor/Rapport: Engaged Affect (typically observed): Accepting Orientation: : Oriented to Self, Oriented to Place, Oriented to Situation, Oriented to  Time   Psych Involvement: No (comment)  Admission diagnosis:  GI bleed [K92.2] Patient Active Problem List   Diagnosis Date Noted   GI bleed 08/18/2022   Iron deficiency anemia 04/25/2022   Epistaxis 04/25/2022   CHF (congestive heart failure), NYHA class I, acute, diastolic (HCC) 04/24/2022   Symptomatic anemia 04/24/2022   Thrombocytosis 04/24/2022   Elevated troponin 04/24/2022   Heart failure (HCC) 04/24/2022    LATERAL EPICONDYLITIS, LEFT 04/10/2010   PANIC DISORDER 10/25/2009   DEPRESSION 10/25/2009   COUGH 10/25/2009   SHOULDER PAIN, RIGHT 03/08/2009   Other specified chronic obstructive pulmonary disease 11/30/2008   WEIGHT GAIN 11/30/2008   CATARACT NOS 10/25/2006   Essential hypertension 10/25/2006   ALLERGIC RHINITIS 10/25/2006   ASTHMA 10/25/2006   DERMATOGRAPHIA 10/25/2006   CATARACT EXTRACTION, HX OF 10/25/2006   ARTHROSCOPY, KNEE, HX OF 10/25/2006   PCP:  Clayborn Heron, MD Pharmacy:   [facility] [address] [place] - [address] [place] [address] Phone: [phone] Fax: [phone]     Social Determinants of Health (SDOH) Social History: SDOH Screenings   Food Insecurity: No Food Insecurity (04/24/2022)  Housing: Low Risk  (04/24/2022)  Transportation Needs: No Transportation Needs (04/24/2022)  Utilities: Not At Risk (04/24/2022)  Tobacco Use: Medium Risk (08/18/2022)   SDOH Interventions:     Readmission Risk Interventions     No data to display

## 2022-08-20 ENCOUNTER — Inpatient Hospital Stay (HOSPITAL_COMMUNITY): Payer: Medicare Other

## 2022-08-20 DIAGNOSIS — K6389 Other specified diseases of intestine: Secondary | ICD-10-CM

## 2022-08-20 DIAGNOSIS — Z8673 Personal history of transient ischemic attack (TIA), and cerebral infarction without residual deficits: Secondary | ICD-10-CM

## 2022-08-20 DIAGNOSIS — I639 Cerebral infarction, unspecified: Secondary | ICD-10-CM | POA: Diagnosis not present

## 2022-08-20 DIAGNOSIS — D509 Iron deficiency anemia, unspecified: Secondary | ICD-10-CM

## 2022-08-20 DIAGNOSIS — K8020 Calculus of gallbladder without cholecystitis without obstruction: Secondary | ICD-10-CM | POA: Diagnosis not present

## 2022-08-20 DIAGNOSIS — R935 Abnormal findings on diagnostic imaging of other abdominal regions, including retroperitoneum: Secondary | ICD-10-CM

## 2022-08-20 DIAGNOSIS — D696 Thrombocytopenia, unspecified: Secondary | ICD-10-CM | POA: Diagnosis not present

## 2022-08-20 DIAGNOSIS — N1330 Unspecified hydronephrosis: Secondary | ICD-10-CM | POA: Diagnosis not present

## 2022-08-20 DIAGNOSIS — I6381 Other cerebral infarction due to occlusion or stenosis of small artery: Secondary | ICD-10-CM

## 2022-08-20 LAB — COMPREHENSIVE METABOLIC PANEL
ALT: 9 U/L (ref 0–44)
AST: 9 U/L — ABNORMAL LOW (ref 15–41)
Albumin: 2.6 g/dL — ABNORMAL LOW (ref 3.5–5.0)
Alkaline Phosphatase: 75 U/L (ref 38–126)
Anion gap: 10 (ref 5–15)
BUN: 15 mg/dL (ref 8–23)
CO2: 18 mmol/L — ABNORMAL LOW (ref 22–32)
Calcium: 8.2 mg/dL — ABNORMAL LOW (ref 8.9–10.3)
Chloride: 109 mmol/L (ref 98–111)
Creatinine, Ser: 0.76 mg/dL (ref 0.44–1.00)
GFR, Estimated: >60 mL/min (ref >60)
Glucose, Bld: 105 mg/dL — ABNORMAL HIGH (ref 70–99)
Potassium: 2.9 mmol/L — ABNORMAL LOW (ref 3.5–5.1)
Sodium: 137 mmol/L (ref 135–145)
Total Bilirubin: 0.6 mg/dL (ref 0.3–1.2)
Total Protein: 6 g/dL — ABNORMAL LOW (ref 6.5–8.1)

## 2022-08-20 LAB — HEMOGLOBIN A1C
Hgb A1c MFr Bld: 4.9 % (ref 4.8–5.6)
Mean Plasma Glucose: 93.93 mg/dL

## 2022-08-20 LAB — CBC
HCT: 26.9 % — ABNORMAL LOW (ref 36.0–46.0)
Hemoglobin: 8.2 g/dL — ABNORMAL LOW (ref 12.0–15.0)
MCH: 22.7 pg — ABNORMAL LOW (ref 26.0–34.0)
MCHC: 30.5 g/dL (ref 30.0–36.0)
MCV: 74.3 fL — ABNORMAL LOW (ref 80.0–100.0)
Platelets: 888 10*3/uL — ABNORMAL HIGH (ref 150–400)
RBC: 3.62 MIL/uL — ABNORMAL LOW (ref 3.87–5.11)
RDW: 21.3 % — ABNORMAL HIGH (ref 11.5–15.5)
WBC: 14.1 10*3/uL — ABNORMAL HIGH (ref 4.0–10.5)
nRBC: 0 % (ref 0.0–0.2)

## 2022-08-20 LAB — BPAM RBC
Blood Product Expiration Date: 202407052359
Blood Product Expiration Date: 202407182359
ISSUE DATE / TIME: 202407021516
ISSUE DATE / TIME: 202407030418

## 2022-08-20 LAB — HEPATITIS PANEL, ACUTE
HCV Ab: NONREACTIVE
Hep A IgM: NONREACTIVE
Hep B C IgM: NONREACTIVE
Hepatitis B Surface Ag: NONREACTIVE

## 2022-08-20 LAB — TYPE AND SCREEN
Unit division: 0
Unit division: 0
Unit division: 0

## 2022-08-20 LAB — MAGNESIUM: Magnesium: 2.2 mg/dL (ref 1.7–2.4)

## 2022-08-20 MED ORDER — PEG-KCL-NACL-NASULF-NA ASC-C 100 G PO SOLR
0.5000 | Freq: Once | ORAL | Status: DC
  Filled 2022-08-20: qty 1

## 2022-08-20 MED ORDER — PEG-KCL-NACL-NASULF-NA ASC-C 100 G PO SOLR: 1.0000 | Freq: Once | ORAL | Status: DC

## 2022-08-20 MED ORDER — BISACODYL 5 MG PO TBEC
5.0000 mg | DELAYED_RELEASE_TABLET | Freq: Once | ORAL | Status: AC
  Administered 2022-08-21: 5 mg via ORAL
  Filled 2022-08-20: qty 1

## 2022-08-20 MED ORDER — PEG-KCL-NACL-NASULF-NA ASC-C 100 G PO SOLR
0.5000 | Freq: Once | ORAL | Status: AC
  Administered 2022-08-20: 100 g via ORAL
  Filled 2022-08-20: qty 1

## 2022-08-20 MED ORDER — BISACODYL 5 MG PO TBEC
5.0000 mg | DELAYED_RELEASE_TABLET | Freq: Once | ORAL | Status: AC
  Administered 2022-08-20: 5 mg via ORAL
  Filled 2022-08-20: qty 1

## 2022-08-20 MED ORDER — GADOBUTROL 1 MMOL/ML IV SOLN
7.0000 mL | Freq: Once | INTRAVENOUS | Status: AC | PRN
  Administered 2022-08-20: 7 mL via INTRAVENOUS

## 2022-08-20 MED ORDER — POTASSIUM CHLORIDE CRYS ER 20 MEQ PO TBCR
40.0000 meq | EXTENDED_RELEASE_TABLET | ORAL | Status: AC
  Administered 2022-08-20 (×2): 40 meq via ORAL
  Filled 2022-08-20 (×2): qty 2

## 2022-08-20 MED ORDER — BISACODYL 5 MG PO TBEC
5.0000 mg | DELAYED_RELEASE_TABLET | Freq: Once | ORAL | Status: DC
  Filled 2022-08-20: qty 1

## 2022-08-20 MED ORDER — POTASSIUM CHLORIDE 10 MEQ/100ML IV SOLN
10.0000 meq | INTRAVENOUS | Status: AC
  Administered 2022-08-20 (×4): 10 meq via INTRAVENOUS
  Filled 2022-08-20 (×3): qty 100

## 2022-08-20 NOTE — Consult Note (Signed)
Urology Consult   Physician requesting consult: Dr. Rito Ehrlich  Reason for consult: Right hydronephrosis  History of Present Illness: Debra Sharp is a 72 y.o. who presented the hospital with history of hypertension, hyperlipidemia, iron deficiency anemia with worsening generalized weakness and new onset abdominal pain and emesis.  CT A/P 08/19/2022 revealed marked fluid and air distention of the majority of the colon with decompressed rectum with suspected mass within the right pelvis with a central glass collection with findings suggesting colon obstruction potentially due to sigmoid mass as well as pelvic mass.  Incidentally, she was found to have right hydroureteronephrosis to the level of the right sided pelvic mass.  Creatinine was found to be 0.76 at her baseline.  She denies any abdominal pain or flank pain. She is voiding without difficulty, denies weak flow of stream, sensation of incomplete emptying, dysuria or hematuria.  She denies a history of voiding or storage urinary symptoms, hematuria, UTIs, STDs, urolithiasis, GU malignancy/trauma/surgery.  Past Medical History:  Diagnosis Date   Anemia    Anxiety    Arthritis    Asthma    Blood transfusion without reported diagnosis    Cataract    removed bilateral   COPD (chronic obstructive pulmonary disease) (HCC)    Depression    GERD (gastroesophageal reflux disease)    Hyperlipidemia    Hypertension     Past Surgical History:  Procedure Laterality Date   APPENDECTOMY     cataracts Bilateral    KNEE SURGERY     NASAL SINUS SURGERY       Current Hospital Medications:  Home meds:  No current facility-administered medications on file prior to encounter.   Current Outpatient Medications on File Prior to Encounter  Medication Sig Dispense Refill   cetirizine (ZYRTEC) 10 MG tablet Take 1 tablet (10 mg total) by mouth daily. (Patient taking differently: Take 10 mg by mouth daily as needed for allergies or rhinitis.) 30  tablet 11   ferrous sulfate 325 (65 FE) MG EC tablet Take 325 mg by mouth daily.     fluticasone (FLONASE) 50 MCG/ACT nasal spray Place 2 sprays into both nostrils daily. (Patient taking differently: Place 2 sprays into both nostrils at bedtime as needed for allergies or rhinitis.) 16 g 6   lisinopril (ZESTRIL) 40 MG tablet Take 40 mg by mouth in the morning.     lovastatin (MEVACOR) 20 MG tablet Take 20 mg by mouth at bedtime.     PROAIR HFA 108 (90 Base) MCG/ACT inhaler Inhale 2 puffs into the lungs every 6 (six) hours as needed for wheezing or shortness of breath.     sertraline (ZOLOFT) 100 MG tablet Take 150 mg by mouth in the morning.     SYMBICORT 160-4.5 MCG/ACT inhaler Inhale 2 puffs into the lungs in the morning and at bedtime.     furosemide (LASIX) 20 MG tablet Take 1 tablet (20 mg total) by mouth daily. (Patient not taking: Reported on 05/13/2022) 10 tablet 0   predniSONE (STERAPRED UNI-PAK 21 TAB) 10 MG (21) TBPK tablet 6 tabs for 1 day, then 5 tabs for 1 das, then 4 tabs for 1 day, then 3 tabs for 1 day, 2 tabs for 1 day, then 1 tab for 1 day (Patient not taking: Reported on 04/24/2022) 21 tablet 0     Scheduled Meds:  sodium chloride   Intravenous Once   [START ON 08/21/2022] bisacodyl  5 mg Oral Once   [START ON 08/21/2022] bisacodyl  5  mg Oral Once   mometasone-formoterol  2 puff Inhalation BID   pantoprazole (PROTONIX) IV  40 mg Intravenous Q12H   peg 3350 powder  0.5 kit Oral Once   And   peg 3350 powder  0.5 kit Oral Once   sertraline  150 mg Oral q AM   Continuous Infusions:  cefTRIAXone (ROCEPHIN)  IV 1 g (08/20/22 1446)   potassium chloride 10 mEq (08/20/22 1540)   PRN Meds:.acetaminophen **OR** acetaminophen, albuterol, hydrALAZINE, HYDROmorphone (DILAUDID) injection, LORazepam, ondansetron **OR** ondansetron (ZOFRAN) IV  Allergies:  Allergies  Allergen Reactions   Aspirin Other (See Comments)    Caused an asthma attack   Ibuprofen Swelling and Other (See Comments)     Angioedema and Asthma exacerbation   Sulfonamide Derivatives Swelling and Other (See Comments)    Face swelled SEVERELY   Penicillins Rash    Family History  Problem Relation Age of Onset   Colon cancer Neg Hx    Colon polyps Neg Hx    Crohn's disease Neg Hx    Esophageal cancer Neg Hx    Rectal cancer Neg Hx    Stomach cancer Neg Hx    Ulcerative colitis Neg Hx     Social History:  reports that she has quit smoking. Her smoking use included cigarettes. She has never used smokeless tobacco. She reports that she does not drink alcohol and does not use drugs.  ROS: A complete review of systems was performed.  All systems are negative except for pertinent findings as noted.  Physical Exam:  Vital signs in last 24 hours: Temp:  [97.9 F (36.6 C)-98.9 F (37.2 C)] 98.1 F (36.7 C) (07/04 1608) Pulse Rate:  [72-93] 73 (07/04 1608) Resp:  [15-18] 15 (07/04 1608) BP: (129-153)/(63-70) 129/67 (07/04 1608) SpO2:  [98 %-100 %] 100 % (07/04 1608) Constitutional:  Alert and oriented, No acute distress Cardiovascular: Regular rate and rhythm Respiratory: Normal respiratory effort, Lungs clear bilaterally GI: Abdomen is soft, nontender, distended GU: No CVA tenderness Neurologic: Grossly intact, no focal deficits Psychiatric: Normal mood and affect  Laboratory Data:  Recent Labs    08/18/22 0946 08/18/22 2248 08/19/22 0208 08/19/22 1013 08/20/22 0321  WBC 14.4*  --  13.9*  --  14.1*  HGB 4.6* 7.1* 7.0* 8.7* 8.2*  HCT 17.7* 23.5* 23.6* 28.1* 26.9*  PLT 1,255*  --  997*  --  888*    Recent Labs    08/18/22 0946 08/19/22 0208 08/20/22 0321  NA 137 136 137  K 2.8* 3.4* 2.9*  CL 105 107 109  GLUCOSE 118* 106* 105*  BUN 14 14 15   CALCIUM 8.4* 8.2* 8.2*  CREATININE 0.84 0.83 0.76     Results for orders placed or performed during the hospital encounter of 08/18/22 (from the past 24 hour(s))  Hemoglobin A1c     Status: None   Collection Time: 08/20/22  3:21 AM   Result Value Ref Range   Hgb A1c MFr Bld 4.9 4.8 - 5.6 %   Mean Plasma Glucose 93.93 mg/dL  Comprehensive metabolic panel     Status: Abnormal   Collection Time: 08/20/22  3:21 AM  Result Value Ref Range   Sodium 137 135 - 145 mmol/L   Potassium 2.9 (L) 3.5 - 5.1 mmol/L   Chloride 109 98 - 111 mmol/L   CO2 18 (L) 22 - 32 mmol/L   Glucose, Bld 105 (H) 70 - 99 mg/dL   BUN 15 8 - 23 mg/dL   Creatinine, Ser  0.76 0.44 - 1.00 mg/dL   Calcium 8.2 (L) 8.9 - 10.3 mg/dL   Total Protein 6.0 (L) 6.5 - 8.1 g/dL   Albumin 2.6 (L) 3.5 - 5.0 g/dL   AST 9 (L) 15 - 41 U/L   ALT 9 0 - 44 U/L   Alkaline Phosphatase 75 38 - 126 U/L   Total Bilirubin 0.6 0.3 - 1.2 mg/dL   GFR, Estimated >14 >78 mL/min   Anion gap 10 5 - 15  Hepatitis panel, acute     Status: None   Collection Time: 08/20/22  3:21 AM  Result Value Ref Range   Hepatitis B Surface Ag NON REACTIVE NON REACTIVE   HCV Ab NON REACTIVE NON REACTIVE   Hep A IgM NON REACTIVE NON REACTIVE   Hep B C IgM NON REACTIVE NON REACTIVE  CBC     Status: Abnormal   Collection Time: 08/20/22  3:21 AM  Result Value Ref Range   WBC 14.1 (H) 4.0 - 10.5 K/uL   RBC 3.62 (L) 3.87 - 5.11 MIL/uL   Hemoglobin 8.2 (L) 12.0 - 15.0 g/dL   HCT 29.5 (L) 62.1 - 30.8 %   MCV 74.3 (L) 80.0 - 100.0 fL   MCH 22.7 (L) 26.0 - 34.0 pg   MCHC 30.5 30.0 - 36.0 g/dL   RDW 65.7 (H) 84.6 - 96.2 %   Platelets 888 (H) 150 - 400 K/uL   nRBC 0.0 0.0 - 0.2 %  Magnesium     Status: None   Collection Time: 08/20/22  7:22 AM  Result Value Ref Range   Magnesium 2.2 1.7 - 2.4 mg/dL   No results found for this or any previous visit (from the past 240 hour(s)).  Renal Function: Recent Labs    08/18/22 0946 08/19/22 0208 08/20/22 0321  CREATININE 0.84 0.83 0.76   Estimated Creatinine Clearance: 68.6 mL/min (by C-G formula based on SCr of 0.76 mg/dL).  Radiologic Imaging: MR BRAIN W CONTRAST  Result Date: 08/20/2022 CLINICAL DATA:  Evaluate for metastatic disease. EXAM:  MRI HEAD WITH CONTRAST TECHNIQUE: Multiplanar, multiecho pulse sequences of the brain and surrounding structures were obtained with intravenous contrast. CONTRAST:  7mL GADAVIST GADOBUTROL 1 MMOL/ML IV SOLN COMPARISON:  Noncontrast brain MRI from 2 days ago FINDINGS: Brain: Punctate enhancement in the right frontal parietal white matter on 5:38, associated with a focus of restricted diffusion. No generalized abnormal enhancement. Vascular: Major vessels are enhancing Skull and upper cervical spine: No focal marrow lesion. Sinuses/Orbits: Active bilateral sinusitis with maxillary fluid levels. Complete left frontal sinus opacification. Enhancing polypoid densities in the bilateral nasal cavity which are numerous. IMPRESSION: 1. Single punctate enhancement in the right frontal parietal white matter, a site of weakly restricted diffusion and compatible with subacute ischemia. If high concern for metastatic disease follow-up in 12 weeks could prove resolution. 2. Active sinusitis with nasal cavity polyps. Electronically Signed   By: Tiburcio Pea M.D.   On: 08/20/2022 12:01   CT ABDOMEN PELVIS W CONTRAST  Result Date: 08/19/2022 CLINICAL DATA:  Hydronephrosis, abnormal gallbladder on ultrasound abdomen distension EXAM: CT ABDOMEN AND PELVIS WITH CONTRAST TECHNIQUE: Multidetector CT imaging of the abdomen and pelvis was performed using the standard protocol following bolus administration of intravenous contrast. RADIATION DOSE REDUCTION: This exam was performed according to the departmental dose-optimization program which includes automated exposure control, adjustment of the mA and/or kV according to patient size and/or use of iterative reconstruction technique. CONTRAST:  75mL OMNIPAQUE IOHEXOL 350 MG/ML SOLN  COMPARISON:  Ultrasound 08/18/2022 FINDINGS: Lower chest: Lung bases demonstrate no acute airspace disease. Mild coronary vascular calcification. Borderline cardiomegaly. Small hiatal hernia Hepatobiliary: No  calcified gallstone or biliary dilatation. No gas within the gallbladder lumen or gallbladder wall. Pancreas: Unremarkable. No pancreatic ductal dilatation or surrounding inflammatory changes. Spleen: Normal in size without focal abnormality. Adrenals/Urinary Tract: Adrenal glands are within normal limits. Severe right-sided hydronephrosis and hydroureter with dilated ureter visualized to the pelvis. No obstructing stone is seen. No significant excretion of contrast right kidney consistent with decreased renal function and obstruction. Decompressed urinary bladder Stomach/Bowel: Stomach nonenlarged. No dilated small bowel. Fluid and air distension of the colon, measuring up to 9.3 cm maximum at the right colon. Decompressed rectum with small volume stool. Unopacified bowel in the pelvis. Poorly identified sigmoid colon with suspicion of possible sigmoid colon mass in the right pelvis, series 3, image 70 but limited due to absence of enteral contrast. Vascular/Lymphatic: Moderate aortic atherosclerosis. No aneurysm. No suspicious lymph nodes. Reproductive: Enlarged uterus with large calcified uterine mass measuring 9 by 8.7 cm consistent with large calcified fibroid. As mentioned previously, ill-defined hypodense area in the right hemipelvis. Other: Negative for free air.  Trace volume fluid in the pelvis Musculoskeletal: Grade 1 anterolisthesis L4 on L5. No acute osseous abnormality IMPRESSION: 1. Marked fluid and air distension of the majority of colon with decompressed rectum and poorly delineated sigmoid colon. Ill-defined hypodensity/suspected mass within the right pelvis with central gas collection. Findings are suspect for colon obstruction, potentially due to sigmoid colon mass, suggest correlation with colonoscopy. There is additional finding of severe right-sided hydronephrosis and hydroureter with dilated ureter visualized to the level of right pelvic ill-defined hypodensity, consistent with distal  ureteral obstruction by right pelvic mass. Other consideration for the right pelvic mass could include gynecologic malignancy. Trace free fluid in the pelvis. No free air. 2. Negative for gallstones or air within the gallbladder as questioned on prior ultrasound 3. Enlarged uterus with large calcified uterine mass consistent with large calcified fibroid. 4. Small hiatal hernia. 5. Aortic atherosclerosis. Aortic Atherosclerosis (ICD10-I70.0). These results will be called to the ordering clinician or representative by the Radiologist Assistant, and communication documented in the PACS or [employer]. Electronically Signed   By: Jasmine Pang M.D.   On: 08/19/2022 17:50   ECHOCARDIOGRAM LIMITED  Result Date: 08/19/2022    ECHOCARDIOGRAM LIMITED REPORT   Patient Name:   Debra Sharp Date of Exam: 08/19/2022 Medical Rec #:  [id number]     Height:       66.0 in Accession #:    [id number]    Weight:       170.0 lb Date of Birth:  02/27/1950     BSA:          1.866 m Patient Age:    72 years      BP:           133/68 mmHg Patient Gender: F             HR:           80 bpm. Exam Location:  Inpatient Procedure: Limited Echo and Color Doppler Indications:    stroke/pericardial effusion  History:        Patient has prior history of Echocardiogram examinations, most                 recent 04/25/2022. CHF, COPD; Risk Factors:Hypertension and Former  Smoker.  Sonographer:    Melissa Morford RDCS (AE, PE) Referring Phys: [id number] Osvaldo Shipper IMPRESSIONS  1. Left ventricular ejection fraction, by estimation, is 40 to 45%. The left ventricle has mildly decreased function. The left ventricle demonstrates global hypokinesis.  2. Right ventricular systolic function is normal. The right ventricular size is normal.  3. The mitral valve is normal in structure. Mild to moderate mitral valve regurgitation.  4. The aortic valve is tricuspid. Aortic valve regurgitation is not visualized.  5. Trivial pericardial effusion  6.  The inferior vena cava is normal in size with greater than 50% respiratory variability, suggesting right atrial pressure of 3 mmHg. FINDINGS  Left Ventricle: Left ventricular ejection fraction, by estimation, is 40 to 45%. The left ventricle has mildly decreased function. The left ventricle demonstrates global hypokinesis. Right Ventricle: The right ventricular size is normal. No increase in right ventricular wall thickness. Right ventricular systolic function is normal. Pericardium: Trivial pericardial effusion is present. Mitral Valve: The mitral valve is normal in structure. Mild to moderate mitral valve regurgitation. Tricuspid Valve: The tricuspid valve is normal in structure. Tricuspid valve regurgitation is trivial. Aortic Valve: The aortic valve is tricuspid. Aortic valve regurgitation is not visualized. Pulmonic Valve: The pulmonic valve was not well visualized. Pulmonic valve regurgitation is not visualized. Venous: The inferior vena cava is normal in size with greater than 50% respiratory variability, suggesting right atrial pressure of 3 mmHg. IAS/Shunts: The interatrial septum was not well visualized. Epifanio Lesches MD Electronically signed by Epifanio Lesches MD Signature Date/Time: 08/19/2022/1:35:21 PM    Final    MR BRAIN WO CONTRAST  Result Date: 08/18/2022 CLINICAL DATA:  Follow-up examination for stroke. EXAM: MRI HEAD WITHOUT CONTRAST TECHNIQUE: Multiplanar, multiecho pulse sequences of the brain and surrounding structures were obtained without intravenous contrast. COMPARISON:  CT from earlier the same day. FINDINGS: Brain: Cerebral volume within normal limits. Scattered patchy T2/FLAIR hyperintensity involving the periventricular deep white matter both cerebral hemispheres, consistent with chronic small vessel ischemic disease, mild-to-moderate in nature. Few scattered punctate foci of diffusion signal abnormality are seen involving the right parietal lobe (series 5, image 87), left  frontal centrum semi ovale (series 7, image 64) and right cerebellum (series 5, image 64). Findings suspicious for tiny acute ischemic infarcts. No associated hemorrhage or mass effect. No other evidence for acute or subacute ischemia. Gray-white matter differentiation otherwise maintained. No acute or chronic intracranial blood products. No mass lesion, midline shift or mass effect. No hydrocephalus or extra-axial fluid collection. Pituitary gland and suprasellar region within normal limits. Vascular: Major intracranial vascular flow voids are maintained. Skull and upper cervical spine: Craniocervical junction within normal limits. Diffuse loss of normal bone marrow signal, nonspecific but can be seen with anemia, smoking, obesity, and infiltrative/myelofibrotic marrow processes. No focal marrow replacing lesion. Sinuses/Orbits: Prior bilateral ocular lens replacement. Scattered mucosal thickening and secretions noted about the frontoethmoidal, sphenoid, and maxillary sinuses. Scattered superimposed air-fluid levels. No mastoid effusion. Other: None. IMPRESSION: 1. Few scattered punctate foci of diffusion signal abnormality involving the right parietal lobe, left frontal centrum semi ovale, and right cerebellum, suspicious for tiny acute ischemic infarcts. No associated hemorrhage or mass effect. 2. Underlying mild to moderate chronic microvascular ischemic disease. Electronically Signed   By: Rise Mu M.D.   On: 08/18/2022 20:24   US Abdomen Limited RUQ (LIVER/GB)  Result Date: 08/18/2022 CLINICAL DATA:  Cirrhosis evaluation EXAM: ULTRASOUND ABDOMEN LIMITED RIGHT UPPER QUADRANT COMPARISON:  None Available. FINDINGS: Gallbladder: Probable small amount  of gallbladder sludge. Few echogenic foci with possible dirty shadowing. Negative sonographic Murphy. Common bile duct: Diameter: 3.4 mm Liver: Slightly coarse hepatic echotexture. No focal hepatic abnormality. Portal vein is patent on color Doppler  imaging with normal direction of blood flow towards the liver. Other: Incidentally noted is moderate severe right hydronephrosis. Cortical thinning of right kidney IMPRESSION: 1. Slightly coarse hepatic echotexture without focal lesion. Possible subtle surface nodularity on some of the views as may be seen with early changes of cirrhosis. 2. Distended gallbladder, appears to contain sludge and echogenic foci some of which exhibit dirty shadowing, question air containing stones. No wall thickening or Murphy. Suggest correlation with CT to exclude air within the gallbladder. 3. Moderate to severe right hydronephrosis with cortical thinning of right kidney. This may also be assessed at CT follow-up. These results will be called to the ordering clinician or representative by the Radiologist Assistant, and communication documented in the PACS or [employer]. Electronically Signed   By: Jasmine Pang M.D.   On: 08/18/2022 19:42    I independently reviewed the above imaging studies.  Impression/Recommendation: Malignant right hydronephrosis: CT A/P 08/19/2022 with right hydroureteronephrosis to the level of a pelvic mass. Pelvic mass Possible colonic mass with colonic obstruction  -I reviewed image with chronic appearing right-sided hydroureteronephrosis with atrophic/cortical thinning of her right kidney.  We discussed that this is due to obstruction from a pelvic mass.  We discussed sources this could be colorectal versus gynecologic.  She has been evaluated with GI and has upcoming upper and lower endoscopy to further evaluate.  We discussed that pending diagnosis, surgical removal of this mass would likely correct the hydronephrosis.  We discussed that in the interim, options would be conservative watchful waiting, right internal stent or right nephrostomy tube. She is not interested in urologic intervention of a stent or PCN which I think is very reasonable given the absence of pain and appropriate kidney  function. Will follow along peripherally. Please call with questions.  Matt R. Gurtha Picker MD 08/20/2022, 4:17 PM  [facility]  Pager: [phone]   CC: Dr. Rito Ehrlich

## 2022-08-20 NOTE — Progress Notes (Addendum)
STROKE TEAM PROGRESS NOTE   BRIEF HPI Debra Sharp is a 72 y.o. female with history of hypertension, hyperlipidemia, iron deficiency anemia presenting with generalized weakness and nausea and vomiting x 3 days.  Per patient, similar episode happened in March 2024 and she was found to have a hemoglobin of 5.  She was advised to follow-up with oncology outpatient but has not followed up yet.  In ED, hemoglobin 4.6, transfused with 2 units.  Fecal occult blood positive GI also consulted   SIGNIFICANT HOSPITAL EVENTS 7/2: CT brain ordered in ED due to worsening weakness showed small age-indeterminate infarct.  Follow-up MRI brain showed scattered punctate ischemic infarcts right parietal, left frontal, right cerebellum. 7/3: CT abdomen pelvis findings suspect for colon obstruction, potentially due to mass. 7/4: MRI with contrast done to evaluate for metastases  shows single punctate enhancement in right frontal parietal, no evidence of mets at this time.  Plan for EGD/colonoscopy on 7/6  INTERIM HISTORY/SUBJECTIVE  On exam today patient discussed generalized weakness that has been going on since March.  States that it has gotten progressively worse over the past few days after she has been nauseous and vomiting.  She denied blood in her vomit.  Incidental findings of stroke, patient is unable to take aspirin as she is allergic and we would hold Plavix due to anemia and in case of GI procedure.  Therefore at this time no antiplatelets are  recommended.  We have ordered TCD study and carotid ultrasound.    CBC    Component Value Date/Time   WBC 14.1 (H) 08/20/2022 0321   RBC 3.62 (L) 08/20/2022 0321   HGB 8.2 (L) 08/20/2022 0321   HCT 26.9 (L) 08/20/2022 0321   PLT 888 (H) 08/20/2022 0321   MCV 74.3 (L) 08/20/2022 0321   MCH 22.7 (L) 08/20/2022 0321   MCHC 30.5 08/20/2022 0321   RDW 21.3 (H) 08/20/2022 0321   LYMPHSABS 0.7 04/24/2022 1011   MONOABS 0.4 04/24/2022 1011   EOSABS 0.0  04/24/2022 1011   BASOSABS 0.3 (H) 04/24/2022 1011    BMET    Component Value Date/Time   NA 137 08/20/2022 0321   K 2.9 (L) 08/20/2022 0321   CL 109 08/20/2022 0321   CO2 18 (L) 08/20/2022 0321   GLUCOSE 105 (H) 08/20/2022 0321   BUN 15 08/20/2022 0321   CREATININE 0.76 08/20/2022 0321   CALCIUM 8.2 (L) 08/20/2022 0321   GFRNONAA >60 08/20/2022 0321    IMAGING past 24 hours MR BRAIN W CONTRAST  Result Date: 08/20/2022 CLINICAL DATA:  Evaluate for metastatic disease. EXAM: MRI HEAD WITH CONTRAST TECHNIQUE: Multiplanar, multiecho pulse sequences of the brain and surrounding structures were obtained with intravenous contrast. CONTRAST:  7mL GADAVIST GADOBUTROL 1 MMOL/ML IV SOLN COMPARISON:  Noncontrast brain MRI from 2 days ago FINDINGS: Brain: Punctate enhancement in the right frontal parietal white matter on 5:38, associated with a focus of restricted diffusion. No generalized abnormal enhancement. Vascular: Major vessels are enhancing Skull and upper cervical spine: No focal marrow lesion. Sinuses/Orbits: Active bilateral sinusitis with maxillary fluid levels. Complete left frontal sinus opacification. Enhancing polypoid densities in the bilateral nasal cavity which are numerous. IMPRESSION: 1. Single punctate enhancement in the right frontal parietal white matter, a site of weakly restricted diffusion and compatible with subacute ischemia. If high concern for metastatic disease follow-up in 12 weeks could prove resolution. 2. Active sinusitis with nasal cavity polyps. Electronically Signed   By: Audry Riles.D.  On: 08/20/2022 12:01    Vitals:   08/19/22 2015 08/20/22 0507 08/20/22 0816 08/20/22 1608  BP: (!) 153/69 (!) 141/68 138/63 129/67  Pulse: 93 74 72 73  Resp: 18 18 16 15   Temp: 98.4 F (36.9 C) 98.1 F (36.7 C) 97.9 F (36.6 C) 98.1 F (36.7 C)  TempSrc: Oral Oral Oral Oral  SpO2: 100% 99% 99% 100%  Weight:      Height:         PHYSICAL EXAM General:  Alert,  well-nourished, well-developed patient in no acute distress Psych:  Mood and affect appropriate for situation CV: Regular rate and rhythm on monitor Respiratory:  Regular, unlabored respirations on room air GI: Abdomen soft and nontender   NEURO:  Mental Status: AA&Ox3, patient is able to give clear and coherent history Speech/Language: speech is without dysarthria or aphasia.  Naming, repetition, fluency, and comprehension intact.  Cranial Nerves:  II: PERRL. Visual fields full.  III, IV, VI: EOMI. Eyelids elevate symmetrically.  V: Sensation is intact to light touch and symmetrical to face.  VII: Face is symmetrical resting and smiling VIII: hearing intact to voice. IX, X: Palate elevates symmetrically. Phonation is normal.  ZO:XWRUEAVW shrug 5/5. XII: tongue is midline without fasciculations. Motor: 4+/5 strength to all muscle groups tested. Generalized weakness.  Tone: is normal and bulk is normal Sensation- Intact to light touch bilaterally. Extinction absent to light touch to DSS.   Coordination: FTN intact bilaterally, HKS: no ataxia in BLE.No drift.  Gait- deferred  NIH: 0  ASSESSMENT/PLAN  Tiny, silent acute infarcts right parietal lobe, left frontal centrum semiovale, right cerebellum Etiology:  likely small vessel disease, possibly embolic--pending full workup.   CT head  Small age-indeterminate infarct in the genu of the corpus callosum, favored remote.   MRI  brain without contrast:  Tiny acute ischemic infarcts involving the right parietal lobe, left frontal centrum semiovale, and right cerebellum.   Underlying mild to moderate chronic microvascular ischemic disease MRI with contrast:  Single punctate enhancement in the right frontal parietal white matter  TCD: Pending Carotid Doppler pending 2D Echo: EF 40 to 45%, mild to moderate MVR  LDL 94 HgbA1c 4.9  VTE prophylaxis -SCDs No antithrombotic prior to admission, now on No antithrombotic Hold Plavix due  to anemia and possible need for GI procedure. Start Plavix on discharge if ok with GI. Patient is unable to take aspirin, as she is allergic. Patient states an anaphylactic reaction to aspirin.   Therapy recommendations:  pending Disposition:  pending   Hypertension Home meds: Lisinopril 40 mg Blood Pressure Goal: SBP less than 160. Gradual normotension.   Hyperlipidemia Home meds:  lovastatin 20mg  LDL 94, goal < 70 Hold statin now due to increased risk of GI bleed. Will discuss resuming with GI post-colonoscopy.    Dysphagia Patient has post-stroke dysphagia, SLP consulted    Diet   Diet clear liquid Room service appropriate? Yes; Fluid consistency: Thin   Diet NPO time specified Except for: Sips with Meds   Advance diet as tolerated  Other Stroke Risk Factors Age > 20   Other Active Problems  GI Bleed Hbg 4.6- > 7.1 -> 8.2 2 units PRBC given 7/2 Hold Plavix   Hospital day # 2   Pt seen by Neuro NP/APP and later by MD. Note/plan to be edited by MD as needed.    Lynnae January, DNP, AGACNP-BC [facility] Please use AMION for contact information & EPIC for messaging.  STROKE MD  NOTE :  I have personally obtained history,examined this patient, reviewed notes, independently viewed imaging studies, participated in medical decision making and plan of care.ROS completed by me personally and pertinent positives fully documented  I have made any additions or clarifications directly to the above note. Agree with note above.  Patient presents with generalized weakness and anemia following COVID several months ago and MRI scan shows small incidental lacunar infarcts.  She has been found to have suspected colonic mass and workup for malignancy is pending at this time.  Patient is allergic to aspirin and given her significant anemia and likely need for biopsy would hold off on starting Plavix till she is discharged.  Continue ongoing stroke workup.  Long discussion with  the patient and Dr. Rito Ehrlich.  Greater than 50% time during this 50-minute visit was spent on counseling and coordination of care about her silent lacunar strokes and discussion about evaluation and treatment of stroke and answering questions  Delia Heady, MD Medical Director [facility] Pager: [phone] 08/20/2022 5:09 PM  To contact Stroke Continuity provider, please refer to [email or web address]. After hours, contact General Neurology

## 2022-08-20 NOTE — Consult Note (Signed)
NEUROLOGY CONSULTATION NOTE   Date of service: August 20, 2022 Patient Name: Debra Sharp MRN:  [id number] DOB:  [birth year] Reason for consult: "incidental strokes" Requesting Provider: Osvaldo Shipper, MD _ _ _   _ __   _ __ _ _  __ __   _ __   __ _  History of Present Illness  Debra Sharp is a 72 y.o. female with PMH significant for GERD, COPD, chronic anemia, HTN, HLD who presented with abdominal pain, generalized weakness, vomiting and found to have acute on chronic anemia. She is admitted and had MRI brain for evaluation of generalized weakness which demonstrated incidental strokes in right parietal lobe, left frontal centrum semi ovale, and right cerebellum.  Neurology consulted for further evaluation and workup.  Denies any symptoms. Walking around in the room. No prior hx of strokes, no family hx of strokes. Endorses hx of HTN, HLD. Does not smoke, no EtOH or drug use. No hx of afibb.  LKW: unclear since no symptoms. mRS: 0 tNKASE: not offered, no symptoms Thrombectomy: not offered, no symptoms. NIHSS components Score: Comment  1a Level of Conscious 0[x]  1[]  2[]  3[]      1b LOC Questions 0[x]  1[]  2[]       1c LOC Commands 0[x]  1[]  2[]       2 Best Gaze 0[x]  1[]  2[]       3 Visual 0[x]  1[]  2[]  3[]      4 Facial Palsy 0[x]  1[]  2[]  3[]      5a Motor Arm - left 0[x]  1[]  2[]  3[]  4[]  UN[]    5b Motor Arm - Right 0[x]  1[]  2[]  3[]  4[]  UN[]    6a Motor Leg - Left 0[x]  1[]  2[]  3[]  4[]  UN[]    6b Motor Leg - Right 0[x]  1[]  2[]  3[]  4[]  UN[]    7 Limb Ataxia 0[x]  1[]  2[]  3[]  UN[]     8 Sensory 0[x]  1[]  2[]  UN[]      9 Best Language 0[x]  1[]  2[]  3[]      10 Dysarthria 0[x]  1[]  2[]  UN[]      11 Extinct. and Inattention 0[x]  1[]  2[]       TOTAL: 0      ROS   Constitutional Denies weight loss, fever and chills.   HEENT Denies changes in vision and hearing.   Respiratory Denies SOB and cough.   CV Denies palpitations and CP   GI Denies abdominal pain, nausea, vomiting and diarrhea.   GU Denies  dysuria and urinary frequency.   MSK Denies myalgia and joint pain.   Skin Denies rash and pruritus.   Neurological Denies headache and syncope.   Psychiatric Denies recent changes in mood. Denies anxiety and depression.    Past History   Past Medical History:  Diagnosis Date   Anemia    Anxiety    Arthritis    Asthma    Blood transfusion without reported diagnosis    Cataract    removed bilateral   COPD (chronic obstructive pulmonary disease) (HCC)    Depression    GERD (gastroesophageal reflux disease)    Hyperlipidemia    Hypertension    Past Surgical History:  Procedure Laterality Date   APPENDECTOMY     cataracts Bilateral    KNEE SURGERY     NASAL SINUS SURGERY     Family History  Problem Relation Age of Onset   Colon cancer Neg Hx    Colon polyps Neg Hx    Crohn's disease Neg Hx    Esophageal cancer Neg Hx    Rectal  cancer Neg Hx    Stomach cancer Neg Hx    Ulcerative colitis Neg Hx    Social History   Socioeconomic History   Marital status: Divorced    Spouse name: Not on file   Number of children: Not on file   Years of education: Not on file   Highest education level: Not on file  Occupational History   Not on file  Tobacco Use   Smoking status: Former    Types: Cigarettes   Smokeless tobacco: Never  Vaping Use   Vaping Use: Never used  Substance and Sexual Activity   Alcohol use: No   Drug use: No   Sexual activity: Not on file  Other Topics Concern   Not on file  Social History Narrative   Not on file   Social Determinants of Health   Financial Resource Strain: Not on file  Food Insecurity: No Food Insecurity (08/19/2022)   Hunger Vital Sign    Worried About Running Out of Food in the Last Year: Never true    Ran Out of Food in the Last Year: Never true  Transportation Needs: No Transportation Needs (08/19/2022)   PRAPARE - Administrator, Civil Service (Medical): No    Lack of Transportation (Non-Medical): No  Physical  Activity: Not on file  Stress: Not on file  Social Connections: Not on file   Allergies  Allergen Reactions   Aspirin Other (See Comments)    Caused an asthma attack   Ibuprofen Swelling and Other (See Comments)    Angioedema and Asthma exacerbation   Sulfonamide Derivatives Swelling and Other (See Comments)    Face swelled SEVERELY   Penicillins Rash    Medications   Medications Prior to Admission  Medication Sig Dispense Refill Last Dose   cetirizine (ZYRTEC) 10 MG tablet Take 1 tablet (10 mg total) by mouth daily. (Patient taking differently: Take 10 mg by mouth daily as needed for allergies or rhinitis.) 30 tablet 11 08/17/2022   ferrous sulfate 325 (65 FE) MG EC tablet Take 325 mg by mouth daily.   08/17/2022   fluticasone (FLONASE) 50 MCG/ACT nasal spray Place 2 sprays into both nostrils daily. (Patient taking differently: Place 2 sprays into both nostrils at bedtime as needed for allergies or rhinitis.) 16 g 6 08/16/2022   lisinopril (ZESTRIL) 40 MG tablet Take 40 mg by mouth in the morning.   08/17/2022   lovastatin (MEVACOR) 20 MG tablet Take 20 mg by mouth at bedtime.   08/17/2022   PROAIR HFA 108 (90 Base) MCG/ACT inhaler Inhale 2 puffs into the lungs every 6 (six) hours as needed for wheezing or shortness of breath.   08/17/2022   sertraline (ZOLOFT) 100 MG tablet Take 150 mg by mouth in the morning.   08/17/2022   SYMBICORT 160-4.5 MCG/ACT inhaler Inhale 2 puffs into the lungs in the morning and at bedtime.   08/17/2022   furosemide (LASIX) 20 MG tablet Take 1 tablet (20 mg total) by mouth daily. (Patient not taking: Reported on 05/13/2022) 10 tablet 0 Not Taking   predniSONE (STERAPRED UNI-PAK 21 TAB) 10 MG (21) TBPK tablet 6 tabs for 1 day, then 5 tabs for 1 das, then 4 tabs for 1 day, then 3 tabs for 1 day, 2 tabs for 1 day, then 1 tab for 1 day (Patient not taking: Reported on 04/24/2022) 21 tablet 0 Not Taking     Vitals   Vitals:   08/19/22 9562 08/19/22 1308  08/19/22 1726 08/19/22  2015  BP:  133/68 133/70 (!) 153/69  Pulse: 76 82 76 93  Resp: 16 17 17 18   Temp:  98.1 F (36.7 C) 98.9 F (37.2 C) 98.4 F (36.9 C)  TempSrc:  Oral Oral Oral  SpO2:  99% 98% 100%  Weight:      Height:         Body mass index is 27.43 kg/m.  Physical Exam   General: Laying comfortably in bed; in no acute distress.  HENT: Normal oropharynx and mucosa. Normal external appearance of ears and nose.  Neck: Supple, no pain or tenderness  CV: No JVD. No peripheral edema.  Pulmonary: Symmetric Chest rise. Normal respiratory effort.  Abdomen: Soft to touch, non-tender.  Ext: No cyanosis, edema, or deformity  Skin: No rash. Normal palpation of skin.   Musculoskeletal: Normal digits and nails by inspection. No clubbing.   Neurologic Examination  Mental status/Cognition: Alert, oriented to self, place, month and year, good attention.  Speech/language: Fluent, comprehension intact, object naming intact, repetition intact.  Cranial nerves:   CN II Pupils equal and reactive to light, no VF deficits    CN III,IV,VI EOM intact, no gaze preference or deviation, no nystagmus    CN V normal sensation in V1, V2, and V3 segments bilaterally    CN VII no asymmetry, no nasolabial fold flattening    CN VIII normal hearing to speech    CN IX & X normal palatal elevation, no uvular deviation    CN XI 5/5 head turn and 5/5 shoulder shrug bilaterally    CN XII midline tongue protrusion    Motor:  Muscle bulk: normal, tone normal, pronator drift none tremor none Mvmt Root Nerve  Muscle Right Left Comments  SA C5/6 Ax Deltoid 5 5   EF C5/6 Mc Biceps 5 5   EE C6/7/8 Rad Triceps 5 5   WF C6/7 Med FCR     WE C7/8 PIN ECU     F Ab C8/T1 U ADM/FDI 5 5   HF L1/2/3 Fem Illopsoas 5 5   KE L2/3/4 Fem Quad 5 5   DF L4/5 D Peron Tib Ant 5 5   PF S1/2 Tibial Grc/Sol 5 5    Sensation:  Light touch Intact throughout   Pin prick    Temperature    Vibration   Proprioception    Coordination/Complex  Motor:  - Finger to Nose intact BL - Heel to shin intact BL - Rapid alternating movement are normal - Gait: Stride length normal. Arm swing normal. Base width narrow.  Labs   CBC:  Recent Labs  Lab 08/18/22 0946 08/18/22 2248 08/19/22 0208 08/19/22 1013  WBC 14.4*  --  13.9*  --   HGB 4.6*   < > 7.0* 8.7*  HCT 17.7*   < > 23.6* 28.1*  MCV 68.3*  --  73.3*  --   PLT 1,255*  --  997*  --    < > = values in this interval not displayed.    Basic Metabolic Panel:  Lab Results  Component Value Date   NA 136 08/19/2022   K 3.4 (L) 08/19/2022   CO2 19 (L) 08/19/2022   GLUCOSE 106 (H) 08/19/2022   BUN 14 08/19/2022   CREATININE 0.83 08/19/2022   CALCIUM 8.2 (L) 08/19/2022   GFRNONAA >60 08/19/2022   GFRAA >60 05/11/2015   Lipid Panel:  Lab Results  Component Value Date   LDLCALC 94 08/19/2022  HgbA1c: No results found for: "HGBA1C" Urine Drug Screen: No results found for: "LABOPIA", "COCAINSCRNUR", "LABBENZ", "AMPHETMU", "THCU", "LABBARB"  Alcohol Level No results found for: "ETH"  CT Head without contrast(Personally reviewed): Small age-indeterminate infarct in the genu of the corpus callosum, favored remote. No other evidence of acute intracranial pathology.  CT angio Head and Neck with contrast: pending  MRI Brain(Personally reviewed): Few scattered punctate foci of diffusion signal abnormality involving the right parietal lobe, left frontal centrum semi ovale, and right cerebellum, suspicious for tiny acute ischemic infarcts. No associated hemorrhage or mass effect.  Impression   Debra Sharp is a 72 y.o. female with PMH significant for GERD, COPD, chronic anemia, HTN, HLD who presented with abdominal pain, generalized weakness, vomiting and found to have acute on chronic anemia. She is admitted and had MRI brain for evaluation of generalized weakness which demonstrated incidental strokes in right parietal lobe, left frontal centrum semi ovale, and right  cerebellum.  Likely strokes are small vessle given their small size and distribution. Would also consider embolic etiologies   Recommendations  - Frequent Neuro checks per stroke unit protocol - Recommend Vascular imaging with CTA Angio Head and neck - TTE result pending. - Recommend obtaining Lipid panel with LDL - Please start statin if LDL > 70 - Recommend HbA1c to evaluate for diabetes and how well it is controlled. - Antithrombotic - none given GI bleed for now. Will need clearance from GI prior to considering. - SBP goal - aim for gradual normotension - Recommend Telemetry monitoring for arrythmia - Recommend bedside swallow screen prior to PO intake. - Stroke education booklet - Recommend PT/OT/SLP consult  ______________________________________________________________________   Thank you for the opportunity to take part in the care of this patient. If you have any further questions, please contact the neurology consultation attending.  Signed,  Erick Blinks [facility] _ _ _   _ __   _ __ _ _  __ __   _ __   __ _

## 2022-08-20 NOTE — Progress Notes (Signed)
[facility] PROGRESS NOTE   Debra Sharp ZOX:[id number] DOB: [birth year] DOA: 08/18/2022  PCP: Clayborn Heron, MD  Brief History/Interval Summary: 72 y.o. female with medical history significant of iron deficiency anemia, HTN, HLD, presented with worsening of generalized weakness, and new onset of abdominal pain and vomiting. Patient was diagnosed with iron deficiency anemia in March 2024, received PRBC and discharged home with p.o. iron supplement.  Patient gave history of weight loss.  She has never had GI workup previously.  CT scan also showed incidental age-indeterminate infarct.  She was hospitalized for further management.  Consultants: Gastroenterology.  Neurology  Procedures: Echocardiogram.    Subjective/Interval History: Patient denies any complaints this morning.  No abdominal pain.  Waiting on gastroenterology to discuss further management for abnormalities detected on CT scan.      Assessment/Plan:  Acute on chronic iron deficiency anemia/concern for GI bleed Patient was given blood transfusion.  Hemoglobin has responded and since noted to be stable this morning.  No overt blood loss noted.   There is suspicion for GI blood loss. Stool for occult blood is noted to be positive.  Gastroenterology has been consulted.  EGD and colonoscopy is being considered.  Colonic distention with concern for sigmoid colon mass Patient presented with nausea.  Right upper quadrant ultrasound showed distended gallbladder and right-sided hydronephrosis CT of the abdomen pelvis was done which showed colonic distention with concern for a sigmoid colon mass.  Gastroenterology is considering doing a colonoscopy as well.  This was offered to the patient yesterday but she declined.  However this was before the CT findings are available. Nausea could have been due to colonic distention.  No abnormalities noted in the gallbladder on CT scan.  Right-sided hydronephrosis with abnormal  UA Severe right-sided hydronephrosis and hydroureter with dilated ureters noted on the CT scan as well.  This is to the level of the right pelvic ill-defined hypodensity which could be due to the right pelvic mass.  Renal function is normal.  Will discuss with urology. Continue ceftriaxone for now.  Incidental acute stroke Incidentally noted on CT scan.  MRI shows tiny infarcts.  Patient without any focal neurological deficits.  No history of strokes previously.  Neurology has been consulted.  Cannot give any antiplatelet agents due to concern for GI bleed. There is an echocardiogram report from March which showed EF of 30 to 35%.  Large pericardial effusion was noted at that time.  Wall motion abnormalities were noted.  It looks like patient left AGAINST MEDICAL ADVICE at that time.  Cardiogram was repeated which showed EF of 40 to 45%.  No pericardial effusion noted on this echocardiogram. LDL 94.  Will need statin therapy discharge. HbA1c is 4.9.   CT angiogram head and neck recommended by neurology but not ordered yet.  Will wait for stroke MD input this morning.  Chronic systolic CHF Echocardiogram from March showed EF of 30 to 35% with large pericardial effusion.  Echocardiogram repeated and shows a EF of 40 to 45%.  No significant pericardial effusion noted this time around.  Patient will need to be established with cardiology after discharge. Currently she is euvolemic. Will consider goal-directed medical treatment depending on workup mentioned above.  Thrombocythemia This was worked up during previous admission in March.  JAK2 study was negative.  Probably reactive from iron deficiency.  Hypokalemia Was supplemented yesterday but noted to be again low today.  Magnesium is 2.2.  Will supplement potassium again.    Essential  hypertension Holding antihypertensives for now.  DVT Prophylaxis: SCDs Code Status: Full code Family Communication: Discussed with patient Disposition Plan: To  be determined  Status is: Inpatient Remains inpatient appropriate because: Severe anemia, acute stroke, UTI      Medications: Scheduled:  sodium chloride   Intravenous Once   mometasone-formoterol  2 puff Inhalation BID   pantoprazole (PROTONIX) IV  40 mg Intravenous Q12H   potassium chloride  40 mEq Oral Q4H   sertraline  150 mg Oral q AM   Continuous:  cefTRIAXone (ROCEPHIN)  IV Stopped (08/19/22 1025)   potassium chloride 10 mEq (08/20/22 0837)   ZHY:QMVHQIONGEXBM **OR** acetaminophen, albuterol, hydrALAZINE, HYDROmorphone (DILAUDID) injection, LORazepam, ondansetron **OR** ondansetron (ZOFRAN) IV  Antibiotics: Anti-infectives (From admission, onward)    Start     Dose/Rate Route Frequency Ordered Stop   08/19/22 1030  cefTRIAXone (ROCEPHIN) 1 g in sodium chloride 0.9 % 100 mL IVPB        1 g 200 mL/hr over 30 Minutes Intravenous Every 24 hours 08/19/22 0937         Objective:  Vital Signs  Vitals:   08/19/22 1726 08/19/22 2015 08/20/22 0507 08/20/22 0816  BP: 133/70 (!) 153/69 (!) 141/68 138/63  Pulse: 76 93 74 72  Resp: 17 18 18 16   Temp: 98.9 F (37.2 C) 98.4 F (36.9 C) 98.1 F (36.7 C) 97.9 F (36.6 C)  TempSrc: Oral Oral Oral Oral  SpO2: 98% 100% 99% 99%  Weight:      Height:        Intake/Output Summary (Last 24 hours) at 08/20/2022 1031 Last data filed at 08/19/2022 2215 Gross per 24 hour  Intake 103.16 ml  Output --  Net 103.16 ml    Filed Weights   08/18/22 0941  Weight: 77.1 kg    General appearance: Awake alert.  In no distress Resp: Clear to auscultation bilaterally.  Normal effort Cardio: S1-S2 is normal regular.  No S3-S4.  No rubs murmurs or bruit GI: Abdomen is soft.  Nontender nondistended.  Bowel sounds are present normal.  No masses organomegaly Extremities: No edema.  Full range of motion of lower extremities. Neurologic: Alert and oriented x3.  No focal neurological deficits.     Lab Results:  Data Reviewed: I have  personally reviewed following labs and reports of the imaging studies  CBC: Recent Labs  Lab 08/18/22 0946 08/18/22 2248 08/19/22 0208 08/19/22 1013 08/20/22 0321  WBC 14.4*  --  13.9*  --  14.1*  HGB 4.6* 7.1* 7.0* 8.7* 8.2*  HCT 17.7* 23.5* 23.6* 28.1* 26.9*  MCV 68.3*  --  73.3*  --  74.3*  PLT 1,255*  --  997*  --  888*     Basic Metabolic Panel: Recent Labs  Lab 08/18/22 0946 08/19/22 0208 08/20/22 0321 08/20/22 0722  NA 137 136 137  --   K 2.8* 3.4* 2.9*  --   CL 105 107 109  --   CO2 21* 19* 18*  --   GLUCOSE 118* 106* 105*  --   BUN 14 14 15   --   CREATININE 0.84 0.83 0.76  --   CALCIUM 8.4* 8.2* 8.2*  --   MG 2.0  --   --  2.2     GFR: Estimated Creatinine Clearance: 68.6 mL/min (by C-G formula based on SCr of 0.76 mg/dL).   CBG: Recent Labs  Lab 08/18/22 1010  GLUCAP 106*     Lipid Profile: Recent Labs  08/19/22 0208  CHOL 152  HDL 37*  LDLCALC 94  TRIG 098  CHOLHDL 4.1     Thyroid Function Tests: Recent Labs    08/18/22 1103  TSH 1.446  FREET4 1.24*     Anemia Panel: Recent Labs    08/18/22 1103  RETICCTPCT 2.8      Radiology Studies: CT ABDOMEN PELVIS W CONTRAST  Result Date: 08/19/2022 CLINICAL DATA:  Hydronephrosis, abnormal gallbladder on ultrasound abdomen distension EXAM: CT ABDOMEN AND PELVIS WITH CONTRAST TECHNIQUE: Multidetector CT imaging of the abdomen and pelvis was performed using the standard protocol following bolus administration of intravenous contrast. RADIATION DOSE REDUCTION: This exam was performed according to the departmental dose-optimization program which includes automated exposure control, adjustment of the mA and/or kV according to patient size and/or use of iterative reconstruction technique. CONTRAST:  75mL OMNIPAQUE IOHEXOL 350 MG/ML SOLN COMPARISON:  Ultrasound 08/18/2022 FINDINGS: Lower chest: Lung bases demonstrate no acute airspace disease. Mild coronary vascular calcification. Borderline  cardiomegaly. Small hiatal hernia Hepatobiliary: No calcified gallstone or biliary dilatation. No gas within the gallbladder lumen or gallbladder wall. Pancreas: Unremarkable. No pancreatic ductal dilatation or surrounding inflammatory changes. Spleen: Normal in size without focal abnormality. Adrenals/Urinary Tract: Adrenal glands are within normal limits. Severe right-sided hydronephrosis and hydroureter with dilated ureter visualized to the pelvis. No obstructing stone is seen. No significant excretion of contrast right kidney consistent with decreased renal function and obstruction. Decompressed urinary bladder Stomach/Bowel: Stomach nonenlarged. No dilated small bowel. Fluid and air distension of the colon, measuring up to 9.3 cm maximum at the right colon. Decompressed rectum with small volume stool. Unopacified bowel in the pelvis. Poorly identified sigmoid colon with suspicion of possible sigmoid colon mass in the right pelvis, series 3, image 70 but limited due to absence of enteral contrast. Vascular/Lymphatic: Moderate aortic atherosclerosis. No aneurysm. No suspicious lymph nodes. Reproductive: Enlarged uterus with large calcified uterine mass measuring 9 by 8.7 cm consistent with large calcified fibroid. As mentioned previously, ill-defined hypodense area in the right hemipelvis. Other: Negative for free air.  Trace volume fluid in the pelvis Musculoskeletal: Grade 1 anterolisthesis L4 on L5. No acute osseous abnormality IMPRESSION: 1. Marked fluid and air distension of the majority of colon with decompressed rectum and poorly delineated sigmoid colon. Ill-defined hypodensity/suspected mass within the right pelvis with central gas collection. Findings are suspect for colon obstruction, potentially due to sigmoid colon mass, suggest correlation with colonoscopy. There is additional finding of severe right-sided hydronephrosis and hydroureter with dilated ureter visualized to the level of right pelvic  ill-defined hypodensity, consistent with distal ureteral obstruction by right pelvic mass. Other consideration for the right pelvic mass could include gynecologic malignancy. Trace free fluid in the pelvis. No free air. 2. Negative for gallstones or air within the gallbladder as questioned on prior ultrasound 3. Enlarged uterus with large calcified uterine mass consistent with large calcified fibroid. 4. Small hiatal hernia. 5. Aortic atherosclerosis. Aortic Atherosclerosis (ICD10-I70.0). These results will be called to the ordering clinician or representative by the Radiologist Assistant, and communication documented in the PACS or [employer]. Electronically Signed   By: Jasmine Pang M.D.   On: 08/19/2022 17:50   ECHOCARDIOGRAM LIMITED  Result Date: 08/19/2022    ECHOCARDIOGRAM LIMITED REPORT   Patient Name:   ANASTACIA TRETTEL Date of Exam: 08/19/2022 Medical Rec #:  [id number]     Height:       66.0 in Accession #:    [id number]    Weight:  170.0 lb Date of Birth:  [birth year]     BSA:          1.866 m Patient Age:    72 years      BP:           133/68 mmHg Patient Gender: F             HR:           80 bpm. Exam Location:  Inpatient Procedure: Limited Echo and Color Doppler Indications:    stroke/pericardial effusion  History:        Patient has prior history of Echocardiogram examinations, most                 recent 04/25/2022. CHF, COPD; Risk Factors:Hypertension and Former                 Smoker.  Sonographer:    Melissa Morford RDCS (AE, PE) Referring Phys: [id number] Osvaldo Shipper IMPRESSIONS  1. Left ventricular ejection fraction, by estimation, is 40 to 45%. The left ventricle has mildly decreased function. The left ventricle demonstrates global hypokinesis.  2. Right ventricular systolic function is normal. The right ventricular size is normal.  3. The mitral valve is normal in structure. Mild to moderate mitral valve regurgitation.  4. The aortic valve is tricuspid. Aortic valve regurgitation is not  visualized.  5. Trivial pericardial effusion  6. The inferior vena cava is normal in size with greater than 50% respiratory variability, suggesting right atrial pressure of 3 mmHg. FINDINGS  Left Ventricle: Left ventricular ejection fraction, by estimation, is 40 to 45%. The left ventricle has mildly decreased function. The left ventricle demonstrates global hypokinesis. Right Ventricle: The right ventricular size is normal. No increase in right ventricular wall thickness. Right ventricular systolic function is normal. Pericardium: Trivial pericardial effusion is present. Mitral Valve: The mitral valve is normal in structure. Mild to moderate mitral valve regurgitation. Tricuspid Valve: The tricuspid valve is normal in structure. Tricuspid valve regurgitation is trivial. Aortic Valve: The aortic valve is tricuspid. Aortic valve regurgitation is not visualized. Pulmonic Valve: The pulmonic valve was not well visualized. Pulmonic valve regurgitation is not visualized. Venous: The inferior vena cava is normal in size with greater than 50% respiratory variability, suggesting right atrial pressure of 3 mmHg. IAS/Shunts: The interatrial septum was not well visualized. Epifanio Lesches MD Electronically signed by Epifanio Lesches MD Signature Date/Time: 08/19/2022/1:35:21 PM    Final    MR BRAIN WO CONTRAST  Result Date: 08/18/2022 CLINICAL DATA:  Follow-up examination for stroke. EXAM: MRI HEAD WITHOUT CONTRAST TECHNIQUE: Multiplanar, multiecho pulse sequences of the brain and surrounding structures were obtained without intravenous contrast. COMPARISON:  CT from earlier the same day. FINDINGS: Brain: Cerebral volume within normal limits. Scattered patchy T2/FLAIR hyperintensity involving the periventricular deep white matter both cerebral hemispheres, consistent with chronic small vessel ischemic disease, mild-to-moderate in nature. Few scattered punctate foci of diffusion signal abnormality are seen involving  the right parietal lobe (series 5, image 87), left frontal centrum semi ovale (series 7, image 64) and right cerebellum (series 5, image 64). Findings suspicious for tiny acute ischemic infarcts. No associated hemorrhage or mass effect. No other evidence for acute or subacute ischemia. Gray-white matter differentiation otherwise maintained. No acute or chronic intracranial blood products. No mass lesion, midline shift or mass effect. No hydrocephalus or extra-axial fluid collection. Pituitary gland and suprasellar region within normal limits. Vascular: Major intracranial vascular flow voids are maintained. Skull and upper cervical  spine: Craniocervical junction within normal limits. Diffuse loss of normal bone marrow signal, nonspecific but can be seen with anemia, smoking, obesity, and infiltrative/myelofibrotic marrow processes. No focal marrow replacing lesion. Sinuses/Orbits: Prior bilateral ocular lens replacement. Scattered mucosal thickening and secretions noted about the frontoethmoidal, sphenoid, and maxillary sinuses. Scattered superimposed air-fluid levels. No mastoid effusion. Other: None. IMPRESSION: 1. Few scattered punctate foci of diffusion signal abnormality involving the right parietal lobe, left frontal centrum semi ovale, and right cerebellum, suspicious for tiny acute ischemic infarcts. No associated hemorrhage or mass effect. 2. Underlying mild to moderate chronic microvascular ischemic disease. Electronically Signed   By: Rise Mu M.D.   On: 08/18/2022 20:24   US Abdomen Limited RUQ (LIVER/GB)  Result Date: 08/18/2022 CLINICAL DATA:  Cirrhosis evaluation EXAM: ULTRASOUND ABDOMEN LIMITED RIGHT UPPER QUADRANT COMPARISON:  None Available. FINDINGS: Gallbladder: Probable small amount of gallbladder sludge. Few echogenic foci with possible dirty shadowing. Negative sonographic Murphy. Common bile duct: Diameter: 3.4 mm Liver: Slightly coarse hepatic echotexture. No focal hepatic  abnormality. Portal vein is patent on color Doppler imaging with normal direction of blood flow towards the liver. Other: Incidentally noted is moderate severe right hydronephrosis. Cortical thinning of right kidney IMPRESSION: 1. Slightly coarse hepatic echotexture without focal lesion. Possible subtle surface nodularity on some of the views as may be seen with early changes of cirrhosis. 2. Distended gallbladder, appears to contain sludge and echogenic foci some of which exhibit dirty shadowing, question air containing stones. No wall thickening or Murphy. Suggest correlation with CT to exclude air within the gallbladder. 3. Moderate to severe right hydronephrosis with cortical thinning of right kidney. This may also be assessed at CT follow-up. These results will be called to the ordering clinician or representative by the Radiologist Assistant, and communication documented in the PACS or [employer]. Electronically Signed   By: Jasmine Pang M.D.   On: 08/18/2022 19:42   CT Head Wo Contrast  Result Date: 08/18/2022 CLINICAL DATA:  Weakness since March, worsening over the last week. Decreased mobility. EXAM: CT HEAD WITHOUT CONTRAST TECHNIQUE: Contiguous axial images were obtained from the base of the skull through the vertex without intravenous contrast. RADIATION DOSE REDUCTION: This exam was performed according to the departmental dose-optimization program which includes automated exposure control, adjustment of the mA and/or kV according to patient size and/or use of iterative reconstruction technique. COMPARISON:  None Available. FINDINGS: Brain: There is no acute intracranial hemorrhage, extra-axial fluid collection, or acute territorial infarct. Parenchymal volume is normal for age. The ventricles are normal in size. Gray-white differentiation is preserved. There is mild background chronic small-vessel ischemic change. There an age-indeterminate but favored remote small infarct in the genu of the  corpus callosum. The pituitary and suprasellar region are normal. There is no mass lesion. There is no mass effect or midline shift. Vascular: No hyperdense vessel or unexpected calcification. Skull: Normal. Negative for fracture or focal lesion. Sinuses/Orbits: There is extensive chronic pansinusitis with layering fluid in the maxillary and sphenoid sinuses. Bilateral lens implants are in place. The globes and orbits are otherwise unremarkable. Other: The mastoid air cells and middle ear cavities are clear. IMPRESSION: 1. 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 No other evidence of acute intracranial pathology. 2. Extensive chronic pansinusitis with possible superimposed acute sinusitis. Electronically Signed   By: Lesia Hausen M.D.   On: 08/18/2022 12:12       LOS: 2 days   Osvaldo Shipper  [facility] Pager on [email or web address]  08/20/2022, 10:31 AM

## 2022-08-20 NOTE — Progress Notes (Signed)
Gastroenterology Inpatient Follow-up Note   PATIENT IDENTIFICATION  Debra Sharp is a 72 y.o. female Hospital Day: 3  SUBJECTIVE  The patient's chart has been reviewed.  Neurology is waiting and planning a CT angiography of the head and neck. The patient's labs have been reviewed. The patient's imaging has been reviewed.  This is concerning for a potential malignancy in the abdominal cavity (query colorectal versus GYN related). The patient relays that she continues to have bowel movements that are formed without any blood. However her bowel habits did change back when she was recently in the hospital in the spring. With the findings on the CT scan from yesterday, she is more amenable to considering inpatient evaluation with a colonoscopy currently. The patient denies fevers or chills.   OBJECTIVE  Scheduled Inpatient Medications:   sodium chloride   Intravenous Once   mometasone-formoterol  2 puff Inhalation BID   pantoprazole (PROTONIX) IV  40 mg Intravenous Q12H   potassium chloride  40 mEq Oral Q4H   sertraline  150 mg Oral q AM   Continuous Inpatient Infusions:   cefTRIAXone (ROCEPHIN)  IV Stopped (08/19/22 1025)   potassium chloride 10 mEq (08/20/22 0837)   PRN Inpatient Medications: acetaminophen **OR** acetaminophen, albuterol, hydrALAZINE, HYDROmorphone (DILAUDID) injection, LORazepam, ondansetron **OR** ondansetron (ZOFRAN) IV   Physical Examination  Temp:  [97.9 F (36.6 C)-98.9 F (37.2 C)] 97.9 F (36.6 C) (07/04 0816) Pulse Rate:  [72-93] 72 (07/04 0816) Resp:  [16-18] 16 (07/04 0816) BP: (133-153)/(63-70) 138/63 (07/04 0816) SpO2:  [98 %-100 %] 99 % (07/04 0816) Temp (24hrs), Avg:98.3 F (36.8 C), Min:97.9 F (36.6 C), Max:98.9 F (37.2 C)  Weight: 77.1 kg GEN: NAD, appears stated age, doesn't appear chronically ill PSYCH: Cooperative, without pressured speech EYE: Conjunctivae pink, sclerae anicteric ENT: MMM CV: Nontachycardic RESP: No audible  wheezing GI: NABS, soft, protuberant abdomen, nontender, without rebound or guarding MSK/EXT: No significant lower extremity edema SKIN: No jaundice NEURO:  Alert & Oriented x 3, no focal deficits   Review of Data   Laboratory Studies   Recent Labs  Lab 08/20/22 0321 08/20/22 0722  NA 137  --   K 2.9*  --   CL 109  --   CO2 18*  --   BUN 15  --   CREATININE 0.76  --   GLUCOSE 105*  --   CALCIUM 8.2*  --   MG  --  2.2   Recent Labs  Lab 08/20/22 0321  AST 9*  ALT 9  ALKPHOS 75    Recent Labs  Lab 08/18/22 0946 08/18/22 2248 08/19/22 0208 08/19/22 1013 08/20/22 0321  WBC 14.4*  --  13.9*  --  14.1*  HGB 4.6*   < > 7.0*   < > 8.2*  HCT 17.7*   < > 23.6*   < > 26.9*  PLT 1,255*  --  997*  --  888*   < > = values in this interval not displayed.   No results for input(s): "APTT", "INR" in the last 168 hours.  Imaging Studies  CT ABDOMEN PELVIS W CONTRAST  Result Date: 08/19/2022 CLINICAL DATA:  Hydronephrosis, abnormal gallbladder on ultrasound abdomen distension EXAM: CT ABDOMEN AND PELVIS WITH CONTRAST TECHNIQUE: Multidetector CT imaging of the abdomen and pelvis was performed using the standard protocol following bolus administration of intravenous contrast. RADIATION DOSE REDUCTION: This exam was performed according to the departmental dose-optimization program which includes automated exposure control, adjustment of the mA and/or kV  according to patient size and/or use of iterative reconstruction technique. CONTRAST:  75mL OMNIPAQUE IOHEXOL 350 MG/ML SOLN COMPARISON:  Ultrasound 08/18/2022 FINDINGS: Lower chest: Lung bases demonstrate no acute airspace disease. Mild coronary vascular calcification. Borderline cardiomegaly. Small hiatal hernia Hepatobiliary: No calcified gallstone or biliary dilatation. No gas within the gallbladder lumen or gallbladder wall. Pancreas: Unremarkable. No pancreatic ductal dilatation or surrounding inflammatory changes. Spleen: Normal in  size without focal abnormality. Adrenals/Urinary Tract: Adrenal glands are within normal limits. Severe right-sided hydronephrosis and hydroureter with dilated ureter visualized to the pelvis. No obstructing stone is seen. No significant excretion of contrast right kidney consistent with decreased renal function and obstruction. Decompressed urinary bladder Stomach/Bowel: Stomach nonenlarged. No dilated small bowel. Fluid and air distension of the colon, measuring up to 9.3 cm maximum at the right colon. Decompressed rectum with small volume stool. Unopacified bowel in the pelvis. Poorly identified sigmoid colon with suspicion of possible sigmoid colon mass in the right pelvis, series 3, image 70 but limited due to absence of enteral contrast. Vascular/Lymphatic: Moderate aortic atherosclerosis. No aneurysm. No suspicious lymph nodes. Reproductive: Enlarged uterus with large calcified uterine mass measuring 9 by 8.7 cm consistent with large calcified fibroid. As mentioned previously, ill-defined hypodense area in the right hemipelvis. Other: Negative for free air.  Trace volume fluid in the pelvis Musculoskeletal: Grade 1 anterolisthesis L4 on L5. No acute osseous abnormality IMPRESSION: 1. Marked fluid and air distension of the majority of colon with decompressed rectum and poorly delineated sigmoid colon. Ill-defined hypodensity/suspected mass within the right pelvis with central gas collection. Findings are suspect for colon obstruction, potentially due to sigmoid colon mass, suggest correlation with colonoscopy. There is additional finding of severe right-sided hydronephrosis and hydroureter with dilated ureter visualized to the level of right pelvic ill-defined hypodensity, consistent with distal ureteral obstruction by right pelvic mass. Other consideration for the right pelvic mass could include gynecologic malignancy. Trace free fluid in the pelvis. No free air. 2. Negative for gallstones or air within the  gallbladder as questioned on prior ultrasound 3. Enlarged uterus with large calcified uterine mass consistent with large calcified fibroid. 4. Small hiatal hernia. 5. Aortic atherosclerosis. Aortic Atherosclerosis (ICD10-I70.0). These results will be called to the ordering clinician or representative by the Radiologist Assistant, and communication documented in the PACS or [employer]. Electronically Signed   By: Jasmine Pang M.D.   On: 08/19/2022 17:50   ECHOCARDIOGRAM LIMITED  Result Date: 08/19/2022    ECHOCARDIOGRAM LIMITED REPORT   Patient Name:   KENDREA PACER Date of Exam: 08/19/2022 Medical Rec #:  [id number]     Height:       66.0 in Accession #:    [id number]    Weight:       170.0 lb Date of Birth:  09-04-1950     BSA:          1.866 m Patient Age:    72 years      BP:           133/68 mmHg Patient Gender: F             HR:           80 bpm. Exam Location:  Inpatient Procedure: Limited Echo and Color Doppler Indications:    stroke/pericardial effusion  History:        Patient has prior history of Echocardiogram examinations, most                 recent  04/25/2022. CHF, COPD; Risk Factors:Hypertension and Former                 Smoker.  Sonographer:    Melissa Morford RDCS (AE, PE) Referring Phys: [id number] Osvaldo Shipper IMPRESSIONS  1. Left ventricular ejection fraction, by estimation, is 40 to 45%. The left ventricle has mildly decreased function. The left ventricle demonstrates global hypokinesis.  2. Right ventricular systolic function is normal. The right ventricular size is normal.  3. The mitral valve is normal in structure. Mild to moderate mitral valve regurgitation.  4. The aortic valve is tricuspid. Aortic valve regurgitation is not visualized.  5. Trivial pericardial effusion  6. The inferior vena cava is normal in size with greater than 50% respiratory variability, suggesting right atrial pressure of 3 mmHg. FINDINGS  Left Ventricle: Left ventricular ejection fraction, by estimation, is 40 to  45%. The left ventricle has mildly decreased function. The left ventricle demonstrates global hypokinesis. Right Ventricle: The right ventricular size is normal. No increase in right ventricular wall thickness. Right ventricular systolic function is normal. Pericardium: Trivial pericardial effusion is present. Mitral Valve: The mitral valve is normal in structure. Mild to moderate mitral valve regurgitation. Tricuspid Valve: The tricuspid valve is normal in structure. Tricuspid valve regurgitation is trivial. Aortic Valve: The aortic valve is tricuspid. Aortic valve regurgitation is not visualized. Pulmonic Valve: The pulmonic valve was not well visualized. Pulmonic valve regurgitation is not visualized. Venous: The inferior vena cava is normal in size with greater than 50% respiratory variability, suggesting right atrial pressure of 3 mmHg. IAS/Shunts: The interatrial septum was not well visualized. Epifanio Lesches MD Electronically signed by Epifanio Lesches MD Signature Date/Time: 08/19/2022/1:35:21 PM    Final    MR BRAIN WO CONTRAST  Result Date: 08/18/2022 CLINICAL DATA:  Follow-up examination for stroke. EXAM: MRI HEAD WITHOUT CONTRAST TECHNIQUE: Multiplanar, multiecho pulse sequences of the brain and surrounding structures were obtained without intravenous contrast. COMPARISON:  CT from earlier the same day. FINDINGS: Brain: Cerebral volume within normal limits. Scattered patchy T2/FLAIR hyperintensity involving the periventricular deep white matter both cerebral hemispheres, consistent with chronic small vessel ischemic disease, mild-to-moderate in nature. Few scattered punctate foci of diffusion signal abnormality are seen involving the right parietal lobe (series 5, image 87), left frontal centrum semi ovale (series 7, image 64) and right cerebellum (series 5, image 64). Findings suspicious for tiny acute ischemic infarcts. No associated hemorrhage or mass effect. No other evidence for acute or  subacute ischemia. Gray-white matter differentiation otherwise maintained. No acute or chronic intracranial blood products. No mass lesion, midline shift or mass effect. No hydrocephalus or extra-axial fluid collection. Pituitary gland and suprasellar region within normal limits. Vascular: Major intracranial vascular flow voids are maintained. Skull and upper cervical spine: Craniocervical junction within normal limits. Diffuse loss of normal bone marrow signal, nonspecific but can be seen with anemia, smoking, obesity, and infiltrative/myelofibrotic marrow processes. No focal marrow replacing lesion. Sinuses/Orbits: Prior bilateral ocular lens replacement. Scattered mucosal thickening and secretions noted about the frontoethmoidal, sphenoid, and maxillary sinuses. Scattered superimposed air-fluid levels. No mastoid effusion. Other: None. IMPRESSION: 1. Few scattered punctate foci of diffusion signal abnormality involving the right parietal lobe, left frontal centrum semi ovale, and right cerebellum, suspicious for tiny acute ischemic infarcts. No associated hemorrhage or mass effect. 2. Underlying mild to moderate chronic microvascular ischemic disease. Electronically Signed   By: Rise Mu M.D.   On: 08/18/2022 20:24   US Abdomen Limited RUQ (LIVER/GB)  Result  Date: 08/18/2022 CLINICAL DATA:  Cirrhosis evaluation EXAM: ULTRASOUND ABDOMEN LIMITED RIGHT UPPER QUADRANT COMPARISON:  None Available. FINDINGS: Gallbladder: Probable small amount of gallbladder sludge. Few echogenic foci with possible dirty shadowing. Negative sonographic Murphy. Common bile duct: Diameter: 3.4 mm Liver: Slightly coarse hepatic echotexture. No focal hepatic abnormality. Portal vein is patent on color Doppler imaging with normal direction of blood flow towards the liver. Other: Incidentally noted is moderate severe right hydronephrosis. Cortical thinning of right kidney IMPRESSION: 1. Slightly coarse hepatic echotexture  without focal lesion. Possible subtle surface nodularity on some of the views as may be seen with early changes of cirrhosis. 2. Distended gallbladder, appears to contain sludge and echogenic foci some of which exhibit dirty shadowing, question air containing stones. No wall thickening or Murphy. Suggest correlation with CT to exclude air within the gallbladder. 3. Moderate to severe right hydronephrosis with cortical thinning of right kidney. This may also be assessed at CT follow-up. These results will be called to the ordering clinician or representative by the Radiologist Assistant, and communication documented in the PACS or [employer]. Electronically Signed   By: Jasmine Pang M.D.   On: 08/18/2022 19:42   CT Head Wo Contrast  Result Date: 08/18/2022 CLINICAL DATA:  Weakness since March, worsening over the last week. Decreased mobility. EXAM: CT HEAD WITHOUT CONTRAST TECHNIQUE: Contiguous axial images were obtained from the base of the skull through the vertex without intravenous contrast. RADIATION DOSE REDUCTION: This exam was performed according to the departmental dose-optimization program which includes automated exposure control, adjustment of the mA and/or kV according to patient size and/or use of iterative reconstruction technique. COMPARISON:  None Available. FINDINGS: Brain: There is no acute intracranial hemorrhage, extra-axial fluid collection, or acute territorial infarct. Parenchymal volume is normal for age. The ventricles are normal in size. Gray-white differentiation is preserved. There is mild background chronic small-vessel ischemic change. There an age-indeterminate but favored remote small infarct in the genu of the corpus callosum. The pituitary and suprasellar region are normal. There is no mass lesion. There is no mass effect or midline shift. Vascular: No hyperdense vessel or unexpected calcification. Skull: Normal. Negative for fracture or focal lesion. Sinuses/Orbits: There  is extensive chronic pansinusitis with layering fluid in the maxillary and sphenoid sinuses. Bilateral lens implants are in place. The globes and orbits are otherwise unremarkable. Other: The mastoid air cells and middle ear cavities are clear. IMPRESSION: 1. 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 No other evidence of acute intracranial pathology. 2. Extensive chronic pansinusitis with possible superimposed acute sinusitis. Electronically Signed   By: Lesia Hausen M.D.   On: 08/18/2022 12:12    GI Procedures and Studies  No relevant studies to review   ASSESSMENT  Ms. Bedoya is a 73 y.o. female with a pmh significant for hypertension, hyperlipidemia, CHF, thrombocytosis, iron deficiency anemia NOS.  Patient admitted with progressive abdominal pain and nausea and vomiting and weakness and found to have progressive anemia and now imaging concerning for a abdominal mass.  The patient is hemodynamically and clinically stable currently.  She states she is having normal formed bowel movements but this is different than her typical pattern over the course of the last few months.  It has been difficult for her to actually undergo procedures that are [place] team has tried to schedule with her in the past and then some difficulty with trying to reach her back to see if she wanted to have anything  performed.  At this point she also has imaging that is concerning for recent CVA.  We appreciate neurology evaluating the patient as well as medicine service for optimization and timing of potential endoscopic evaluation.  I am concerned about an underlying colorectal malignancy at this point.  An upper and lower endoscopy are recommended.  She is now agreeable to have both of these procedures.  I am tentatively planning for these on Saturday unless there is contraindication from our neurology service based on the upcoming CT angiography that they are ordering.  Will place a  preparation order for Friday so that she can be ready for Saturday, but we can certainly reevaluate the timing of this if there are issues from a medicine or neurology standpoint.  The risks and benefits of endoscopic evaluation were discussed with the patient; these include but are not limited to the risk of perforation, infection, bleeding, missed lesions, lack of diagnosis, severe illness requiring hospitalization, as well as anesthesia and sedation related illnesses.  The patient and/or family is agreeable to proceed.  All patient questions were answered to the best of my ability, and the patient agrees to the aforementioned plan of action with follow-up as indicated.   PLAN/RECOMMENDATIONS  Tentative plan for EGD/colonoscopy on 7/6 -Dulcolax 10 mg today -Dulcolax 10 mg on 7/5 afternoon -Dulcolax 10 mg on 7/6 AM -Moviprep 1/2 @1700  on 7/5 -Moviprep 1/2 @2100  on 7/5 If contraindication from neurology standpoint or medicine standpoint for procedures based on updated imaging, please let us know and we can reevaluate the timing of procedures (we know that patients who have had recent strokes may be at increased risk from an anesthesia standpoint during procedures) Recommend IV iron as inpatient   Please page/call with questions or concerns.   Corliss Parish, MD [facility] Office # [phone]    LOS: 2 days  Lemar Lofty  08/20/2022, 10:28 AM

## 2022-08-21 ENCOUNTER — Inpatient Hospital Stay (HOSPITAL_COMMUNITY): Payer: Medicare Other

## 2022-08-21 DIAGNOSIS — I639 Cerebral infarction, unspecified: Secondary | ICD-10-CM

## 2022-08-21 DIAGNOSIS — R935 Abnormal findings on diagnostic imaging of other abdominal regions, including retroperitoneum: Secondary | ICD-10-CM | POA: Diagnosis not present

## 2022-08-21 DIAGNOSIS — K8020 Calculus of gallbladder without cholecystitis without obstruction: Secondary | ICD-10-CM | POA: Diagnosis not present

## 2022-08-21 DIAGNOSIS — Z8673 Personal history of transient ischemic attack (TIA), and cerebral infarction without residual deficits: Secondary | ICD-10-CM

## 2022-08-21 DIAGNOSIS — D509 Iron deficiency anemia, unspecified: Secondary | ICD-10-CM | POA: Diagnosis not present

## 2022-08-21 DIAGNOSIS — D696 Thrombocytopenia, unspecified: Secondary | ICD-10-CM | POA: Diagnosis not present

## 2022-08-21 DIAGNOSIS — K6389 Other specified diseases of intestine: Secondary | ICD-10-CM | POA: Diagnosis not present

## 2022-08-21 DIAGNOSIS — N1330 Unspecified hydronephrosis: Secondary | ICD-10-CM | POA: Diagnosis not present

## 2022-08-21 LAB — BASIC METABOLIC PANEL
Anion gap: 14 (ref 5–15)
BUN: 16 mg/dL (ref 8–23)
CO2: 17 mmol/L — ABNORMAL LOW (ref 22–32)
Calcium: 8.5 mg/dL — ABNORMAL LOW (ref 8.9–10.3)
Chloride: 110 mmol/L (ref 98–111)
Creatinine, Ser: 0.77 mg/dL (ref 0.44–1.00)
GFR, Estimated: >60 mL/min (ref >60)
Glucose, Bld: 90 mg/dL (ref 70–99)
Potassium: 2.9 mmol/L — ABNORMAL LOW (ref 3.5–5.1)
Sodium: 141 mmol/L (ref 135–145)

## 2022-08-21 LAB — CBC
HCT: 30.1 % — ABNORMAL LOW (ref 36.0–46.0)
Hemoglobin: 8.8 g/dL — ABNORMAL LOW (ref 12.0–15.0)
MCH: 22.6 pg — ABNORMAL LOW (ref 26.0–34.0)
MCHC: 29.2 g/dL — ABNORMAL LOW (ref 30.0–36.0)
MCV: 77.2 fL — ABNORMAL LOW (ref 80.0–100.0)
Platelets: 935 10*3/uL (ref 150–400)
RBC: 3.9 MIL/uL (ref 3.87–5.11)
RDW: 23 % — ABNORMAL HIGH (ref 11.5–15.5)
WBC: 14.8 10*3/uL — ABNORMAL HIGH (ref 4.0–10.5)
nRBC: 0 % (ref 0.0–0.2)

## 2022-08-21 LAB — PROTIME-INR
INR: 1.1 (ref 0.8–1.2)
Prothrombin Time: 14.4 seconds (ref 11.4–15.2)

## 2022-08-21 LAB — POTASSIUM: Potassium: 3.7 mmol/L (ref 3.5–5.1)

## 2022-08-21 LAB — MAGNESIUM: Magnesium: 2 mg/dL (ref 1.7–2.4)

## 2022-08-21 MED ORDER — POTASSIUM CHLORIDE IN NACL 20-0.9 MEQ/L-% IV SOLN
INTRAVENOUS | Status: AC
  Filled 2022-08-21: qty 1000

## 2022-08-21 MED ORDER — POTASSIUM CHLORIDE 10 MEQ/100ML IV SOLN
10.0000 meq | INTRAVENOUS | Status: AC
  Administered 2022-08-21 (×4): 10 meq via INTRAVENOUS
  Filled 2022-08-21 (×3): qty 100

## 2022-08-21 MED ORDER — POTASSIUM CHLORIDE CRYS ER 20 MEQ PO TBCR
40.0000 meq | EXTENDED_RELEASE_TABLET | Freq: Once | ORAL | Status: AC
  Administered 2022-08-21: 40 meq via ORAL
  Filled 2022-08-21: qty 2

## 2022-08-21 MED ORDER — PEG-KCL-NACL-NASULF-NA ASC-C 100 G PO SOLR: 0.5000 | Freq: Once | ORAL | Status: DC

## 2022-08-21 MED ORDER — POTASSIUM CHLORIDE 10 MEQ/100ML IV SOLN
10.0000 meq | INTRAVENOUS | Status: AC
  Administered 2022-08-21: 10 meq via INTRAVENOUS
  Filled 2022-08-21: qty 100

## 2022-08-21 NOTE — Care Management Important Message (Signed)
Important Message  Patient Details  Name: Debra Sharp MRN: [id number] Date of Birth: [birth year]   Medicare Important Message Given:  Yes     Renie Ora 08/21/2022, 9:50 AM

## 2022-08-21 NOTE — Progress Notes (Addendum)
   Daily Progress Note  DOA: 08/18/2022 Hospital Day: 4 Chief Complaint: iron deficiency anemia   Assessment and Plan:    Brief Narrative:  Debra Sharp is a 72 y.o. year old female whose past medical history includes, but isn't necessarily limited to,   Patient is a 72 y.o. year old female with a past medical history not limited to thrombocytosis, HTN, asthma and anxiety. Presented with persistent profound iron deficiency anemia, heme + stools, recent onset of nausea / vomiting. Imaging concerning for sigmoid mass  -She has agreed to proceed with inpatient colonoscopy ( had already agreed to an EGD). Will plan for procedures to be done on 7/6 if Neurology is okay with that . - Hgb stable at 8.8  post transfusion.   Addendum : Neurology cleared her for procedures tomorrow. Will prep for EGD / colonoscopy tomorrow.    Chronic thrombocytosis. Previously evaluated by Hematology and JAK2 study was negative. Felt to be reactive.  Tiny, acute brain infarcts. Neurology evaluating.  -Work up in progress.   Moderate to severe R hydronephrosis secondary to right pelvic mass ( colonic vrs GYN)  -Urology evaluated She doesn't want stent or other intervention which is reasonable in absence of pain and good renal function    Concern for cirrhosis of RUQ US.  -Outpatient workup is appropriate    Subjective / New Events:   No specific complaints.   Objective:   Recent Labs    08/19/22 0208 08/19/22 1013 08/20/22 0321 08/21/22 0807  WBC 13.9*  --  14.1* 14.8*  HGB 7.0* 8.7* 8.2* 8.8*  HCT 23.6* 28.1* 26.9* 30.1*  PLT 997*  --  888* 935*   BMET Recent Labs    08/19/22 0208 08/20/22 0321 08/21/22 0807  NA 136 137 141  K 3.4* 2.9* 2.9*  CL 107 109 110  CO2 19* 18* 17*  GLUCOSE 106* 105* 90  BUN 14 15 16  CREATININE 0.83 0.76 0.77  CALCIUM 8.2* 8.2* 8.5*   LFT Recent Labs    08/20/22 0321  PROT 6.0*  ALBUMIN 2.6*  AST 9*  ALT 9  ALKPHOS 75  BILITOT 0.6    PT/INR Recent Labs    08/21/22 0259  LABPROT 14.4  INR 1.1     Imaging:  DG Abd Portable 1V CLINICAL DATA:  [id number] Nausea and vomiting [id number]  EXAM: PORTABLE ABDOMEN - 1 VIEW  COMPARISON:  CT 08/19/2022  FINDINGS: Persistent dilated loops of colon. A few gas distended small bowel loops in the left mid abdomen. Calcified uterine fibroid.  IMPRESSION: Persistent dilated colon suggesting distal obstruction.  Electronically Signed   By: D  Hassell M.D.   On: 08/21/2022 12:51     Scheduled inpatient medications:   sodium chloride   Intravenous Once   bisacodyl  5 mg Oral Once   bisacodyl  5 mg Oral Once   mometasone-formoterol  2 puff Inhalation BID   pantoprazole (PROTONIX) IV  40 mg Intravenous Q12H   peg 3350 powder  0.5 kit Oral Once   sertraline  150 mg Oral q AM   Continuous inpatient infusions:   0.9 % NaCl with KCl 20 mEq / L     cefTRIAXone (ROCEPHIN)  IV 1 g (08/21/22 1124)   potassium chloride 10 mEq (08/21/22 1230)   PRN inpatient medications: acetaminophen **OR** acetaminophen, albuterol, hydrALAZINE, HYDROmorphone (DILAUDID) injection, LORazepam, ondansetron **OR** ondansetron (ZOFRAN) IV  Vital signs in last 24 hours: Temp:  [97.7 F (36.5 C)-98.2 F (36.8 C)]   98.2 F (36.8 C) (07/05 0821) Pulse Rate:  [73-77] 74 (07/05 0821) Resp:  [15-18] 16 (07/05 0821) BP: (129-153)/(63-77) 149/67 (07/05 0821) SpO2:  [97 %-100 %] 100 % (07/05 0821) Last BM Date : 08/21/22  Intake/Output Summary (Last 24 hours) at 08/21/2022 1329 Last data filed at 08/21/2022 1159 Gross per 24 hour  Intake 560 ml  Output --  Net 560 ml    Intake/Output from previous day: 07/04 0701 - 07/05 0700 In: 200 [P.O.:200] Out: -  Intake/Output this shift: Total I/O In: 360 [P.O.:360] Out: -    Physical Exam:  General: Alert female in NAD Heart:  Regular rate and rhythm.  Pulmonary: Normal respiratory effort Abdomen: Soft, nondistended, nontender. Normal bowel  sounds. Extremities: No lower extremity edema  Neurologic: Alert and oriented Psych: Pleasant. Cooperative. Insight appears normal.    Principal Problem:   GI bleed Active Problems:   Symptomatic anemia   Thrombocytosis   Iron deficiency anemia   Abnormal CT of the abdomen   Colon distention   History of cardioembolic cerebrovascular accident (CVA)   Silent cerebral infarction (HCC)     LOS: 3 days   [name] ,NP 08/21/2022, 1:29 PM       

## 2022-08-21 NOTE — Progress Notes (Addendum)
Date and time results received: 08/21/22 0904 (use smartphrase ".now" to insert current time)  Test: platelets Critical Value: 935  Name of Provider Notified: Dr. Osvaldo Shipper  Orders Received? Or Actions Taken?: Acknowledged.

## 2022-08-21 NOTE — Progress Notes (Addendum)
Carotid duplex bilateral and TCD study completed.   Team, including MD, RN, and NT, notified that BP of the right arm may be inaccurate given vascular findings.   Please see CV Proc for preliminary results.   Jean Rosenthal, RDMS, RVT

## 2022-08-21 NOTE — Progress Notes (Signed)
[facility] PROGRESS NOTE   Debra Sharp ZOX:[id number] DOB: [birth year] DOA: 08/18/2022  PCP: Clayborn Heron, MD  Brief History/Interval Summary: 72 y.o. female with medical history significant of iron deficiency anemia, HTN, HLD, presented with worsening of generalized weakness, and new onset of abdominal pain and vomiting. Patient was diagnosed with iron deficiency anemia in March 2024, received PRBC and discharged home with p.o. iron supplement.  Patient gave history of weight loss.  She has never had GI workup previously.  CT scan also showed incidental age-indeterminate infarct.  She was hospitalized for further management.  Consultants: Gastroenterology.  Neurology  Procedures: Echocardiogram.    Subjective/Interval History: Patient complains of nausea.  She apparently vomited several times during the night.  She attributes this to acid reflux.  Denies any abdominal pain.  Passing gas from below but no bowel movements yet.  Urinating well.   Assessment/Plan:  Acute on chronic iron deficiency anemia/concern for GI bleed Patient was given blood transfusion.  Hemoglobin responded and has been stable for the last 2 days. There is suspicion for GI blood loss. Stool for occult blood is noted to be positive.  Gastroenterology is following.  Plan is for EGD and colonoscopy tomorrow.  Mild stroke noted on MRI.  Should not be a contraindication to procedures.  Colonic distention with concern for sigmoid colon mass Patient presented with nausea.  Right upper quadrant ultrasound showed distended gallbladder and right-sided hydronephrosis CT of the abdomen pelvis was done which showed colonic distention with concern for a sigmoid colon mass.  Gastroenterology is considering doing a colonoscopy as well.  This was offered to the patient yesterday but she declined.  However this was before the CT findings are available. Nausea could have been due to colonic distention.  No  abnormalities noted in the gallbladder on CT scan. Patient reports of vomiting overnight.  Will do abdominal film. Continue with PPI.  Right-sided hydronephrosis with abnormal UA Severe right-sided hydronephrosis and hydroureter with dilated ureters noted on the CT scan as well.  This is to the level of the right pelvic ill-defined hypodensity which could be due to the right pelvic mass.  Renal function is normal.   Discussed with urology.  Appreciate their input.  No interventions planned at this time.  Will discuss with urology. Continue ceftriaxone for now.  It does not appear that urine culture was ever sent.  Acute stroke Incidentally noted on CT scan.  MRI shows tiny infarcts.  Patient without any focal neurological deficits.  No history of strokes previously.   There is an echocardiogram report from March which showed EF of 30 to 35%.  Large pericardial effusion was noted at that time.  Wall motion abnormalities were noted.  Echocardiogram was repeated which showed EF of 40 to 45%.  Global hypokinesis was noted.  No pericardial effusion noted on this echocardiogram. LDL 94.  Will need statin therapy at discharge. HbA1c is 4.9.   Carotid Dopplers and transcranial Dopplers recommended by the stroke service which has been ordered.  Chronic systolic CHF Echocardiogram from March showed EF of 30 to 35% with large pericardial effusion.  Echocardiogram repeated and shows a EF of 40 to 45%.  No significant pericardial effusion noted this time around.  No wall motion abnormalities noted.  Global hypokinesis was seen.  Patient will need to be established with cardiology after discharge. Currently she is euvolemic. Will consider goal-directed medical treatment depending on workup mentioned above.  Thrombocythemia This was worked up during previous  admission in March.  JAK2 study was negative.  Probably reactive from iron deficiency.  Hypokalemia Potassium level remains low despite aggressive  supplementation.  Will continue to supplement aggressively today.  Her vomiting is also contributing.  Will supplement intravenously.  Magnesium is 2.0.    Essential hypertension Holding antihypertensives for now.  DVT Prophylaxis: SCDs Code Status: Full code Family Communication: Discussed with patient Disposition Plan: To be determined  Status is: Inpatient Remains inpatient appropriate because: Severe anemia, acute stroke, UTI     Medications: Scheduled:  sodium chloride   Intravenous Once   bisacodyl  5 mg Oral Once   bisacodyl  5 mg Oral Once   mometasone-formoterol  2 puff Inhalation BID   pantoprazole (PROTONIX) IV  40 mg Intravenous Q12H   peg 3350 powder  0.5 kit Oral Once   sertraline  150 mg Oral q AM   Continuous:  cefTRIAXone (ROCEPHIN)  IV 1 g (08/20/22 1446)   GNF:AOZHYQMVHQION **OR** acetaminophen, albuterol, hydrALAZINE, HYDROmorphone (DILAUDID) injection, LORazepam, ondansetron **OR** ondansetron (ZOFRAN) IV  Antibiotics: Anti-infectives (From admission, onward)    Start     Dose/Rate Route Frequency Ordered Stop   08/19/22 1030  cefTRIAXone (ROCEPHIN) 1 g in sodium chloride 0.9 % 100 mL IVPB        1 g 200 mL/hr over 30 Minutes Intravenous Every 24 hours 08/19/22 0937         Objective:  Vital Signs  Vitals:   08/20/22 1608 08/20/22 2047 08/21/22 0512 08/21/22 0821  BP: 129/67 (!) 153/77 (!) 144/63 (!) 149/67  Pulse: 73 77 73 74  Resp: 15 18 18 16   Temp: 98.1 F (36.7 C) 97.7 F (36.5 C) 98 F (36.7 C) 98.2 F (36.8 C)  TempSrc: Oral Oral Oral   SpO2: 100% 97% 99% 100%  Weight:      Height:        Intake/Output Summary (Last 24 hours) at 08/21/2022 0921 Last data filed at 08/20/2022 2106 Gross per 24 hour  Intake 200 ml  Output --  Net 200 ml    Filed Weights   08/18/22 0941  Weight: 77.1 kg    General appearance: Awake alert.  In no distress Resp: Clear to auscultation bilaterally.  Normal effort Cardio: S1-S2 is normal  regular.  No S3-S4.  No rubs murmurs or bruit GI: Abdomen is soft.  Mildly distended.  Tender in the epigastric area.  No rebound rigidity or guarding. Extremities: No edema.  Full range of motion of lower extremities. Neurologic: Alert and oriented x3.  No focal neurological deficits.      Lab Results:  Data Reviewed: I have personally reviewed following labs and reports of the imaging studies  CBC: Recent Labs  Lab 08/18/22 0946 08/18/22 2248 08/19/22 0208 08/19/22 1013 08/20/22 0321 08/21/22 0807  WBC 14.4*  --  13.9*  --  14.1* 14.8*  HGB 4.6* 7.1* 7.0* 8.7* 8.2* 8.8*  HCT 17.7* 23.5* 23.6* 28.1* 26.9* 30.1*  MCV 68.3*  --  73.3*  --  74.3* 77.2*  PLT 1,255*  --  997*  --  888* 935*     Basic Metabolic Panel: Recent Labs  Lab 08/18/22 0946 08/19/22 0208 08/20/22 0321 08/20/22 0722 08/21/22 0807  NA 137 136 137  --  141  K 2.8* 3.4* 2.9*  --  2.9*  CL 105 107 109  --  110  CO2 21* 19* 18*  --  17*  GLUCOSE 118* 106* 105*  --  90  BUN 14 14 15   --  16  CREATININE 0.84 0.83 0.76  --  0.77  CALCIUM 8.4* 8.2* 8.2*  --  8.5*  MG 2.0  --   --  2.2 2.0     GFR: Estimated Creatinine Clearance: 68.6 mL/min (by C-G formula based on SCr of 0.77 mg/dL).   CBG: Recent Labs  Lab 08/18/22 1010  GLUCAP 106*     Lipid Profile: Recent Labs    08/19/22 0208  CHOL 152  HDL 37*  LDLCALC 94  TRIG 846  CHOLHDL 4.1     Thyroid Function Tests: Recent Labs    08/18/22 1103  TSH 1.446  FREET4 1.24*     Anemia Panel: Recent Labs    08/18/22 1103  RETICCTPCT 2.8      Radiology Studies: MR BRAIN W CONTRAST  Result Date: 08/20/2022 CLINICAL DATA:  Evaluate for metastatic disease. EXAM: MRI HEAD WITH CONTRAST TECHNIQUE: Multiplanar, multiecho pulse sequences of the brain and surrounding structures were obtained with intravenous contrast. CONTRAST:  7mL GADAVIST GADOBUTROL 1 MMOL/ML IV SOLN COMPARISON:  Noncontrast brain MRI from 2 days ago FINDINGS:  Brain: Punctate enhancement in the right frontal parietal white matter on 5:38, associated with a focus of restricted diffusion. No generalized abnormal enhancement. Vascular: Major vessels are enhancing Skull and upper cervical spine: No focal marrow lesion. Sinuses/Orbits: Active bilateral sinusitis with maxillary fluid levels. Complete left frontal sinus opacification. Enhancing polypoid densities in the bilateral nasal cavity which are numerous. IMPRESSION: 1. Single punctate enhancement in the right frontal parietal white matter, a site of weakly restricted diffusion and compatible with subacute ischemia. If high concern for metastatic disease follow-up in 12 weeks could prove resolution. 2. Active sinusitis with nasal cavity polyps. Electronically Signed   By: Tiburcio Pea M.D.   On: 08/20/2022 12:01   CT ABDOMEN PELVIS W CONTRAST  Result Date: 08/19/2022 CLINICAL DATA:  Hydronephrosis, abnormal gallbladder on ultrasound abdomen distension EXAM: CT ABDOMEN AND PELVIS WITH CONTRAST TECHNIQUE: Multidetector CT imaging of the abdomen and pelvis was performed using the standard protocol following bolus administration of intravenous contrast. RADIATION DOSE REDUCTION: This exam was performed according to the departmental dose-optimization program which includes automated exposure control, adjustment of the mA and/or kV according to patient size and/or use of iterative reconstruction technique. CONTRAST:  75mL OMNIPAQUE IOHEXOL 350 MG/ML SOLN COMPARISON:  Ultrasound 08/18/2022 FINDINGS: Lower chest: Lung bases demonstrate no acute airspace disease. Mild coronary vascular calcification. Borderline cardiomegaly. Small hiatal hernia Hepatobiliary: No calcified gallstone or biliary dilatation. No gas within the gallbladder lumen or gallbladder wall. Pancreas: Unremarkable. No pancreatic ductal dilatation or surrounding inflammatory changes. Spleen: Normal in size without focal abnormality. Adrenals/Urinary Tract:  Adrenal glands are within normal limits. Severe right-sided hydronephrosis and hydroureter with dilated ureter visualized to the pelvis. No obstructing stone is seen. No significant excretion of contrast right kidney consistent with decreased renal function and obstruction. Decompressed urinary bladder Stomach/Bowel: Stomach nonenlarged. No dilated small bowel. Fluid and air distension of the colon, measuring up to 9.3 cm maximum at the right colon. Decompressed rectum with small volume stool. Unopacified bowel in the pelvis. Poorly identified sigmoid colon with suspicion of possible sigmoid colon mass in the right pelvis, series 3, image 70 but limited due to absence of enteral contrast. Vascular/Lymphatic: Moderate aortic atherosclerosis. No aneurysm. No suspicious lymph nodes. Reproductive: Enlarged uterus with large calcified uterine mass measuring 9 by 8.7 cm consistent with large calcified fibroid. As mentioned previously, ill-defined hypodense area in  the right hemipelvis. Other: Negative for free air.  Trace volume fluid in the pelvis Musculoskeletal: Grade 1 anterolisthesis L4 on L5. No acute osseous abnormality IMPRESSION: 1. Marked fluid and air distension of the majority of colon with decompressed rectum and poorly delineated sigmoid colon. Ill-defined hypodensity/suspected mass within the right pelvis with central gas collection. Findings are suspect for colon obstruction, potentially due to sigmoid colon mass, suggest correlation with colonoscopy. There is additional finding of severe right-sided hydronephrosis and hydroureter with dilated ureter visualized to the level of right pelvic ill-defined hypodensity, consistent with distal ureteral obstruction by right pelvic mass. Other consideration for the right pelvic mass could include gynecologic malignancy. Trace free fluid in the pelvis. No free air. 2. Negative for gallstones or air within the gallbladder as questioned on prior ultrasound 3. Enlarged  uterus with large calcified uterine mass consistent with large calcified fibroid. 4. Small hiatal hernia. 5. Aortic atherosclerosis. Aortic Atherosclerosis (ICD10-I70.0). These results will be called to the ordering clinician or representative by the Radiologist Assistant, and communication documented in the PACS or [employer]. Electronically Signed   By: Jasmine Pang M.D.   On: 08/19/2022 17:50   ECHOCARDIOGRAM LIMITED  Result Date: 08/19/2022    ECHOCARDIOGRAM LIMITED REPORT   Patient Name:   Debra Sharp Date of Exam: 08/19/2022 Medical Rec #:  [id number]     Height:       66.0 in Accession #:    [id number]    Weight:       170.0 lb Date of Birth:  Nov 15, 1950     BSA:          1.866 m Patient Age:    72 years      BP:           133/68 mmHg Patient Gender: F             HR:           80 bpm. Exam Location:  Inpatient Procedure: Limited Echo and Color Doppler Indications:    stroke/pericardial effusion  History:        Patient has prior history of Echocardiogram examinations, most                 recent 04/25/2022. CHF, COPD; Risk Factors:Hypertension and Former                 Smoker.  Sonographer:    Melissa Morford RDCS (AE, PE) Referring Phys: [id number] Osvaldo Shipper IMPRESSIONS  1. Left ventricular ejection fraction, by estimation, is 40 to 45%. The left ventricle has mildly decreased function. The left ventricle demonstrates global hypokinesis.  2. Right ventricular systolic function is normal. The right ventricular size is normal.  3. The mitral valve is normal in structure. Mild to moderate mitral valve regurgitation.  4. The aortic valve is tricuspid. Aortic valve regurgitation is not visualized.  5. Trivial pericardial effusion  6. The inferior vena cava is normal in size with greater than 50% respiratory variability, suggesting right atrial pressure of 3 mmHg. FINDINGS  Left Ventricle: Left ventricular ejection fraction, by estimation, is 40 to 45%. The left ventricle has mildly decreased function. The  left ventricle demonstrates global hypokinesis. Right Ventricle: The right ventricular size is normal. No increase in right ventricular wall thickness. Right ventricular systolic function is normal. Pericardium: Trivial pericardial effusion is present. Mitral Valve: The mitral valve is normal in structure. Mild to moderate mitral valve regurgitation. Tricuspid Valve: The tricuspid valve is normal in structure.  Tricuspid valve regurgitation is trivial. Aortic Valve: The aortic valve is tricuspid. Aortic valve regurgitation is not visualized. Pulmonic Valve: The pulmonic valve was not well visualized. Pulmonic valve regurgitation is not visualized. Venous: The inferior vena cava is normal in size with greater than 50% respiratory variability, suggesting right atrial pressure of 3 mmHg. IAS/Shunts: The interatrial septum was not well visualized. Epifanio Lesches MD Electronically signed by Epifanio Lesches MD Signature Date/Time: 08/19/2022/1:35:21 PM    Final        LOS: 3 days   Osvaldo Shipper  [facility] Pager on [email or web address]  08/21/2022, 9:21 AM

## 2022-08-21 NOTE — Progress Notes (Signed)
STROKE TEAM PROGRESS NOTE   BRIEF HPI [name] is a 72 y.o. female with history of hypertension, hyperlipidemia, iron deficiency anemia presenting with generalized weakness and nausea and vomiting x 3 days.  Per patient, similar episode happened in March 2024 and she was found to have a hemoglobin of 5.  She was advised to follow-up with oncology outpatient but has not followed up yet.  In ED, hemoglobin 4.6, transfused with 2 units.  Fecal occult blood positive GI also consulted   SIGNIFICANT HOSPITAL EVENTS 7/2: CT brain ordered in ED due to worsening weakness showed small age-indeterminate infarct.  Follow-up MRI brain showed scattered punctate ischemic infarcts right parietal, left frontal, right cerebellum. 7/3: CT abdomen pelvis findings suspect for colon obstruction, potentially due to mass. 7/4: MRI with contrast done to evaluate for metastases  shows single punctate enhancement in right frontal parietal, no evidence of mets at this time.  Plan for EGD/colonoscopy on 7/6  INTERIM HISTORY/SUBJECTIVE  Patient is lying comfortably in bed.  She has no neurological complaints.  She is scheduled to undergo diagnostic stich endoscopy.  Plavix has not been started due to likely need for biopsy.  She is allergic to aspirin.   CBC    Component Value Date/Time   WBC 14.8 (H) 08/21/2022 0807   RBC 3.90 08/21/2022 0807   HGB 8.8 (L) 08/21/2022 0807   HCT 30.1 (L) 08/21/2022 0807   PLT 935 (HH) 08/21/2022 0807   MCV 77.2 (L) 08/21/2022 0807   MCH 22.6 (L) 08/21/2022 0807   MCHC 29.2 (L) 08/21/2022 0807   RDW 23.0 (H) 08/21/2022 0807   LYMPHSABS 0.7 04/24/2022 1011   MONOABS 0.4 04/24/2022 1011   EOSABS 0.0 04/24/2022 1011   BASOSABS 0.3 (H) 04/24/2022 1011    BMET    Component Value Date/Time   NA 141 08/21/2022 0807   K 3.7 08/21/2022 1440   CL 110 08/21/2022 0807   CO2 17 (L) 08/21/2022 0807   GLUCOSE 90 08/21/2022 0807   BUN 16 08/21/2022 0807   CREATININE 0.77  08/21/2022 0807   CALCIUM 8.5 (L) 08/21/2022 0807   GFRNONAA >60 08/21/2022 0807    IMAGING past 24 hours VAS [place] TRANSCRANIAL DOPPLER  Result Date: 08/21/2022  Transcranial Doppler Patient Name:  Debra Sharp  Date of Exam:   08/21/2022 Medical Rec #: [id number]      Accession #:    [id number] Date of Birth: 12-07-1950      Patient Gender: F Patient Age:   53 years Exam Location:  [facility] Procedure:      VAS [place] TRANSCRANIAL DOPPLER Referring Phys: Osvaldo Shipper --------------------------------------------------------------------------------  Indications: Stroke. Comparison Study: No prior studies. Performing Technologist: Jean Rosenthal RDMS, RVT  Examination Guidelines: A complete evaluation includes B-mode imaging, spectral Doppler, color Doppler, and power Doppler as needed of all accessible portions of each vessel. Bilateral testing is considered an integral part of a complete examination. Limited examinations for reoccurring indications may be performed as noted.  +----------+---------------+----------+-----------+------------------+ RIGHT TCD Right VM (cm/s)Depth (cm)Pulsatility     Comment       +----------+---------------+----------+-----------+------------------+ MCA             79                    1.17                       +----------+---------------+----------+-----------+------------------+ ACA             -  36                   1.13                       +----------+---------------+----------+-----------+------------------+ Term ICA        53                    1.11                       +----------+---------------+----------+-----------+------------------+ PCA P1          -15                   0.46                       +----------+---------------+----------+-----------+------------------+ Opthalmic       26                    1.81                       +----------+---------------+----------+-----------+------------------+ ICA siphon      63                     1.30                       +----------+---------------+----------+-----------+------------------+ Vertebral                                     Bidirectional flow +----------+---------------+----------+-----------+------------------+  +----------+--------------+----------+-----------+------------------+ LEFT TCD  Left VM (cm/s)Depth (cm)Pulsatility     Comment       +----------+--------------+----------+-----------+------------------+ MCA            101                   1.21                       +----------+--------------+----------+-----------+------------------+ ACA                                          Unable to insonate +----------+--------------+----------+-----------+------------------+ Term ICA        26                   1.45                       +----------+--------------+----------+-----------+------------------+ PCA P1          33                   1.04                       +----------+--------------+----------+-----------+------------------+ Opthalmic       23                   1.92                       +----------+--------------+----------+-----------+------------------+ ICA siphon      45                   1.34                       +----------+--------------+----------+-----------+------------------+  Vertebral      -32                   1.29                       +----------+--------------+----------+-----------+------------------+  +------------+-------+-------+             VM cm/sComment +------------+-------+-------+ Prox Basilar  -43          +------------+-------+-------+ Dist Basilar  -11          +------------+-------+-------+    Preliminary    VAS US CAROTID  Result Date: 08/21/2022 Carotid Arterial Duplex Study Patient Name:  Debra Sharp  Date of Exam:   08/21/2022 Medical Rec #: [id number]      Accession #:    [id number] Date of Birth: 12-28-1950      Patient Gender: F Patient Age:   43 years Exam  Location:  [facility] Procedure:      VAS US CAROTID Referring Phys: Osvaldo Shipper --------------------------------------------------------------------------------  Indications:       CVA. Risk Factors:      Hypertension, hyperlipidemia. Comparison Study:  No prior studies. Performing Technologist: Jean Rosenthal RDMS, RVT  Examination Guidelines: A complete evaluation includes B-mode imaging, spectral Doppler, color Doppler, and power Doppler as needed of all accessible portions of each vessel. Bilateral testing is considered an integral part of a complete examination. Limited examinations for reoccurring indications may be performed as noted.  Right Carotid Findings: +----------+--------+--------+--------+------------------+---------+           PSV cm/sEDV cm/sStenosisPlaque DescriptionComments  +----------+--------+--------+--------+------------------+---------+ CCA Prox  185     18                                Turbulent +----------+--------+--------+--------+------------------+---------+ CCA Distal129     22                                          +----------+--------+--------+--------+------------------+---------+ ICA Prox  147     31      1-39%   calcific          tortuous  +----------+--------+--------+--------+------------------+---------+ ICA Distal118     27                                          +----------+--------+--------+--------+------------------+---------+ ECA       111     16                                          +----------+--------+--------+--------+------------------+---------+ +----------+--------+-------+--------+-------------------+           PSV cm/sEDV cmsDescribeArm Pressure (mmHG) +----------+--------+-------+--------+-------------------+ [id number]            Stenotic                    +----------+--------+-------+--------+-------------------+ +---------+--------+--+--------+---------------+ VertebralPSV  cm/s21EDV cm/sBi- directional +---------+--------+--+--------+---------------+  Left Carotid Findings: +----------+--------+--------+--------+------------------+--------+           PSV cm/sEDV cm/sStenosisPlaque DescriptionComments +----------+--------+--------+--------+------------------+--------+ CCA Prox  67      16                                         +----------+--------+--------+--------+------------------+--------+  CCA Distal77      17                                         +----------+--------+--------+--------+------------------+--------+ ICA Prox  89      25      1-39%   calcific                   +----------+--------+--------+--------+------------------+--------+ ICA Distal95      31                                         +----------+--------+--------+--------+------------------+--------+ ECA       80                                                 +----------+--------+--------+--------+------------------+--------+ +----------+--------+--------+----------------+-------------------+           PSV cm/sEDV cm/sDescribe        Arm Pressure (mmHG) +----------+--------+--------+----------------+-------------------+ [id number]              Multiphasic, WNL                    +----------+--------+--------+----------------+-------------------+ +---------+--------+--+--------+--+---------+ VertebralPSV cm/s74EDV cm/s14Antegrade +---------+--------+--+--------+--+---------+   Summary: Right Carotid: Velocities in the right ICA are consistent with a 1-39% stenosis. Left Carotid: Velocities in the left ICA are consistent with a 1-39% stenosis. Vertebrals:  Left vertebral artery demonstrates antegrade flow. Right vertebral              artery demonstrates bidirectional flow. Subclavians: Right subclavian artery was stenotic. Normal flow hemodynamics were              seen in the left subclavian artery. *See table(s) above for measurements and  observations.     Preliminary    DG Abd Portable 1V  Result Date: 08/21/2022 CLINICAL DATA:  [id number] Nausea and vomiting [id number] EXAM: PORTABLE ABDOMEN - 1 VIEW COMPARISON:  CT 08/19/2022 FINDINGS: Persistent dilated loops of colon. A few gas distended small bowel loops in the left mid abdomen. Calcified uterine fibroid. IMPRESSION: Persistent dilated colon suggesting distal obstruction. Electronically Signed   By: Corlis Leak M.D.   On: 08/21/2022 12:51    Vitals:   08/20/22 2047 08/21/22 0512 08/21/22 0821 08/21/22 1603  BP: (!) 153/77 (!) 144/63 (!) 149/67 (!) 145/64  Pulse: 77 73 74 78  Resp: 18 18 16 15   Temp: 97.7 F (36.5 C) 98 F (36.7 C) 98.2 F (36.8 C) 98 F (36.7 C)  TempSrc: Oral Oral  Oral  SpO2: 97% 99% 100% 99%  Weight:      Height:         PHYSICAL EXAM General:  Alert, well-nourished, well-developed patient in no acute distress Psych:  Mood and affect appropriate for situation CV: Regular rate and rhythm on monitor Respiratory:  Regular, unlabored respirations on room air GI: Abdomen soft and nontender   NEURO:  Mental Status: AA&Ox3, patient is able to give clear and coherent history Speech/Language: speech is without dysarthria or aphasia.  Naming, repetition, fluency, and comprehension intact.  Cranial Nerves:  II: PERRL. Visual fields full.  III, IV, VI: EOMI. Eyelids elevate symmetrically.  V: Sensation  is intact to light touch and symmetrical to face.  VII: Face is symmetrical resting and smiling VIII: hearing intact to voice. IX, X: Palate elevates symmetrically. Phonation is normal.  [id number] shrug 5/5. XII: tongue is midline without fasciculations. Motor: 4+/5 strength to all muscle groups tested. Generalized weakness.  Tone: is normal and bulk is normal Sensation- Intact to light touch bilaterally. Extinction absent to light touch to DSS.   Coordination: FTN intact bilaterally, HKS: no ataxia in BLE.No drift.  Gait- deferred  NIH:  0  ASSESSMENT/PLAN  Tiny, silent acute infarcts right parietal lobe, left frontal centrum semiovale, right cerebellum Etiology:  likely small vessel disease, possibly embolic--pending full workup.   CT head  Small age-indeterminate infarct in the genu of the corpus callosum, favored remote.   MRI  brain without contrast:  Tiny acute ischemic infarcts involving the right parietal lobe, left frontal centrum semiovale, and right cerebellum.   Underlying mild to moderate chronic microvascular ischemic disease MRI with contrast:  Single punctate enhancement in the right frontal parietal white matter  TCD: Mildly elevated left greater than right MCA velocities suggestive of mild stenosis.  Carotid Doppler bilateral 1-39% stenosis 2D Echo: EF 40 to 45%, mild to moderate MVR  LDL 94 HgbA1c 4.9  VTE prophylaxis -SCDs No antithrombotic prior to admission, now on No antithrombotic Hold Plavix due to anemia and possible need for GI procedure. Start Plavix on discharge if ok with GI. Patient is unable to take aspirin, as she is allergic. Patient states an anaphylactic reaction to aspirin.   Therapy recommendations:  pending Disposition:  pending   Hypertension Home meds: Lisinopril 40 mg Blood Pressure Goal: SBP less than 160. Gradual normotension.   Hyperlipidemia Home meds:  lovastatin 20mg  LDL 94, goal < 70 Hold statin now due to increased risk of GI bleed. Will discuss resuming with GI post-colonoscopy.    Dysphagia Patient has post-stroke dysphagia, SLP consulted    Diet   Diet clear liquid Room service appropriate? Yes; Fluid consistency: Thin   Diet NPO time specified Except for: Sips with Meds   Advance diet as tolerated  Other Stroke Risk Factors Age > 55   Other Active Problems  GI Bleed Hbg 4.6- > 7.1 -> 8.2 2 units PRBC given 7/2 Hold Plavix   Hospital day # 3   Patient presents with generalized weakness and anemia following COVID several months ago and MRI  scan shows small incidental lacunar infarcts.  She has been found to have suspected colonic mass and workup for malignancy is pending at this time.  Patient is allergic to aspirin and given her significant anemia and likely need for biopsy would hold off on starting Plavix till she is discharged.  C  Long discussion with the patient and Dr. Rito Ehrlich.  Greater than 50% time during this 35 minute visit was spent on counseling and coordination of care about her silent lacunar strokes and discussion about evaluation and treatment of stroke and answering questions .stroke team will sign off.  Kindly call for questions  Delia Heady, MD Medical Director [facility] Pager: [phone] 08/21/2022 7:10 PM  To contact Stroke Continuity provider, please refer to [email or web address]. After hours, contact General Neurology

## 2022-08-22 ENCOUNTER — Inpatient Hospital Stay (HOSPITAL_COMMUNITY): Payer: Medicare Other | Admitting: Certified Registered"

## 2022-08-22 ENCOUNTER — Inpatient Hospital Stay (HOSPITAL_COMMUNITY): Payer: Medicare Other

## 2022-08-22 ENCOUNTER — Encounter (HOSPITAL_COMMUNITY): Payer: Self-pay | Admitting: Internal Medicine

## 2022-08-22 ENCOUNTER — Other Ambulatory Visit: Payer: Self-pay

## 2022-08-22 ENCOUNTER — Encounter (HOSPITAL_COMMUNITY)
Admission: EM | Disposition: A | Discharge status: Nursing Home (Includes ICF) | Payer: Self-pay | Source: Home / Self Care | Attending: Family Medicine

## 2022-08-22 DIAGNOSIS — Z8673 Personal history of transient ischemic attack (TIA), and cerebral infarction without residual deficits: Secondary | ICD-10-CM | POA: Diagnosis not present

## 2022-08-22 DIAGNOSIS — K631 Perforation of intestine (nontraumatic): Secondary | ICD-10-CM | POA: Diagnosis not present

## 2022-08-22 DIAGNOSIS — D509 Iron deficiency anemia, unspecified: Secondary | ICD-10-CM | POA: Diagnosis not present

## 2022-08-22 DIAGNOSIS — J449 Chronic obstructive pulmonary disease, unspecified: Secondary | ICD-10-CM | POA: Diagnosis not present

## 2022-08-22 DIAGNOSIS — J9601 Acute respiratory failure with hypoxia: Secondary | ICD-10-CM | POA: Diagnosis not present

## 2022-08-22 DIAGNOSIS — K6389 Other specified diseases of intestine: Secondary | ICD-10-CM | POA: Diagnosis not present

## 2022-08-22 DIAGNOSIS — Z9911 Dependence on respirator [ventilator] status: Secondary | ICD-10-CM

## 2022-08-22 DIAGNOSIS — Z87891 Personal history of nicotine dependence: Secondary | ICD-10-CM

## 2022-08-22 DIAGNOSIS — I110 Hypertensive heart disease with heart failure: Secondary | ICD-10-CM

## 2022-08-22 DIAGNOSIS — I5031 Acute diastolic (congestive) heart failure: Secondary | ICD-10-CM | POA: Diagnosis not present

## 2022-08-22 DIAGNOSIS — D696 Thrombocytopenia, unspecified: Secondary | ICD-10-CM | POA: Diagnosis not present

## 2022-08-22 DIAGNOSIS — N1330 Unspecified hydronephrosis: Secondary | ICD-10-CM | POA: Diagnosis not present

## 2022-08-22 DIAGNOSIS — K8020 Calculus of gallbladder without cholecystitis without obstruction: Secondary | ICD-10-CM | POA: Diagnosis not present

## 2022-08-22 HISTORY — PX: LAPAROTOMY: SHX154

## 2022-08-22 HISTORY — PX: PARTIAL COLECTOMY: SHX5273

## 2022-08-22 HISTORY — PX: COLOSTOMY: SHX63

## 2022-08-22 LAB — POCT I-STAT 7, (LYTES, BLD GAS, ICA,H+H)
Acid-base deficit: 13 mmol/L — ABNORMAL HIGH (ref 0.0–2.0)
Acid-base deficit: 9 mmol/L — ABNORMAL HIGH (ref 0.0–2.0)
Bicarbonate: 14.7 mmol/L — ABNORMAL LOW (ref 20.0–28.0)
Bicarbonate: 18.4 mmol/L — ABNORMAL LOW (ref 20.0–28.0)
Calcium, Ion: 1.27 mmol/L (ref 1.15–1.40)
Calcium, Ion: 1.21 mmol/L (ref 1.15–1.40)
HCT: 25 % — ABNORMAL LOW (ref 36.0–46.0)
HCT: 28 % — ABNORMAL LOW (ref 36.0–46.0)
Hemoglobin: 8.5 g/dL — ABNORMAL LOW (ref 12.0–15.0)
Hemoglobin: 9.5 g/dL — ABNORMAL LOW (ref 12.0–15.0)
O2 Saturation: 100 %
O2 Saturation: 99 %
Patient temperature: 97.7
Potassium: 5.6 mmol/L — ABNORMAL HIGH (ref 3.5–5.1)
Potassium: 5.7 mmol/L — ABNORMAL HIGH (ref 3.5–5.1)
Sodium: 138 mmol/L (ref 135–145)
Sodium: 140 mmol/L (ref 135–145)
TCO2: 16 mmol/L — ABNORMAL LOW (ref 22–32)
TCO2: 20 mmol/L — ABNORMAL LOW (ref 22–32)
pCO2 arterial: 41 mmHg (ref 32–48)
pCO2 arterial: 44.2 mmHg (ref 32–48)
pH, Arterial: 7.163 — CL (ref 7.35–7.45)
pH, Arterial: 7.225 — ABNORMAL LOW (ref 7.35–7.45)
pO2, Arterial: 260 mmHg — ABNORMAL HIGH (ref 83–108)
pO2, Arterial: 190 mmHg — ABNORMAL HIGH (ref 83–108)

## 2022-08-22 LAB — BASIC METABOLIC PANEL
Anion gap: 11 (ref 5–15)
BUN: 16 mg/dL (ref 8–23)
CO2: 15 mmol/L — ABNORMAL LOW (ref 22–32)
Calcium: 8.3 mg/dL — ABNORMAL LOW (ref 8.9–10.3)
Chloride: 113 mmol/L — ABNORMAL HIGH (ref 98–111)
Creatinine, Ser: 0.8 mg/dL (ref 0.44–1.00)
GFR, Estimated: >60 mL/min (ref >60)
Glucose, Bld: 83 mg/dL (ref 70–99)
Potassium: 3.7 mmol/L (ref 3.5–5.1)
Sodium: 139 mmol/L (ref 135–145)

## 2022-08-22 LAB — COMPREHENSIVE METABOLIC PANEL
ALT: 8 U/L (ref 0–44)
AST: 9 U/L — ABNORMAL LOW (ref 15–41)
Albumin: 2.7 g/dL — ABNORMAL LOW (ref 3.5–5.0)
Alkaline Phosphatase: 44 U/L (ref 38–126)
Anion gap: 12 (ref 5–15)
BUN: 14 mg/dL (ref 8–23)
CO2: 17 mmol/L — ABNORMAL LOW (ref 22–32)
Calcium: 8 mg/dL — ABNORMAL LOW (ref 8.9–10.3)
Chloride: 112 mmol/L — ABNORMAL HIGH (ref 98–111)
Creatinine, Ser: 0.9 mg/dL (ref 0.44–1.00)
GFR, Estimated: >60 mL/min (ref >60)
Glucose, Bld: 93 mg/dL (ref 70–99)
Potassium: 5.3 mmol/L — ABNORMAL HIGH (ref 3.5–5.1)
Sodium: 141 mmol/L (ref 135–145)
Total Bilirubin: 1.9 mg/dL — ABNORMAL HIGH (ref 0.3–1.2)
Total Protein: 4.6 g/dL — ABNORMAL LOW (ref 6.5–8.1)

## 2022-08-22 LAB — CBC
HCT: 28.8 % — ABNORMAL LOW (ref 36.0–46.0)
HCT: 29.3 % — ABNORMAL LOW (ref 36.0–46.0)
Hemoglobin: 8.4 g/dL — ABNORMAL LOW (ref 12.0–15.0)
Hemoglobin: 8.4 g/dL — ABNORMAL LOW (ref 12.0–15.0)
MCH: 23 pg — ABNORMAL LOW (ref 26.0–34.0)
MCH: 23.1 pg — ABNORMAL LOW (ref 26.0–34.0)
MCHC: 29.2 g/dL — ABNORMAL LOW (ref 30.0–36.0)
MCHC: 28.7 g/dL — ABNORMAL LOW (ref 30.0–36.0)
MCV: 78.7 fL — ABNORMAL LOW (ref 80.0–100.0)
MCV: 80.7 fL (ref 80.0–100.0)
Platelets: 879 10*3/uL — ABNORMAL HIGH (ref 150–400)
Platelets: 949 10*3/uL (ref 150–400)
RBC: 3.66 MIL/uL — ABNORMAL LOW (ref 3.87–5.11)
RBC: 3.63 MIL/uL — ABNORMAL LOW (ref 3.87–5.11)
RDW: 23.5 % — ABNORMAL HIGH (ref 11.5–15.5)
RDW: 24.3 % — ABNORMAL HIGH (ref 11.5–15.5)
WBC: 11.2 10*3/uL — ABNORMAL HIGH (ref 4.0–10.5)
WBC: 25.1 10*3/uL — ABNORMAL HIGH (ref 4.0–10.5)
nRBC: 0 % (ref 0.0–0.2)
nRBC: 0 % (ref 0.0–0.2)

## 2022-08-22 LAB — GLUCOSE, CAPILLARY
Glucose-Capillary: 77 mg/dL (ref 70–99)
Glucose-Capillary: 89 mg/dL (ref 70–99)

## 2022-08-22 LAB — TYPE AND SCREEN: Antibody Screen: NEGATIVE

## 2022-08-22 LAB — CEA: CEA: 143 ng/mL — ABNORMAL HIGH (ref 0.0–4.7)

## 2022-08-22 LAB — BPAM RBC
ISSUE DATE / TIME: 202407061813
ISSUE DATE / TIME: 202407061813
Unit Type and Rh: 6200

## 2022-08-22 LAB — LACTIC ACID, PLASMA: Lactic Acid, Venous: 1 mmol/L (ref 0.5–1.9)

## 2022-08-22 LAB — PREPARE RBC (CROSSMATCH)

## 2022-08-22 LAB — MRSA NEXT GEN BY PCR, NASAL: MRSA by PCR Next Gen: NOT DETECTED

## 2022-08-22 SURGERY — EGD (ESOPHAGOGASTRODUODENOSCOPY)
Anesthesia: Monitor Anesthesia Care

## 2022-08-22 SURGERY — LAPAROTOMY, EXPLORATORY
Anesthesia: General

## 2022-08-22 MED ORDER — PROPOFOL 500 MG/50ML IV EMUL
INTRAVENOUS | Status: DC | PRN
  Administered 2022-08-22: 50 ug/kg/min via INTRAVENOUS

## 2022-08-22 MED ORDER — SODIUM CHLORIDE 0.9% IV SOLUTION: Freq: Once | INTRAVENOUS | Status: DC

## 2022-08-22 MED ORDER — ESMOLOL HCL 100 MG/10ML IV SOLN
INTRAVENOUS | Status: AC
  Filled 2022-08-22: qty 10

## 2022-08-22 MED ORDER — FENTANYL CITRATE (PF) 250 MCG/5ML IJ SOLN
INTRAMUSCULAR | Status: AC
  Filled 2022-08-22: qty 5

## 2022-08-22 MED ORDER — VASOPRESSIN 20 UNIT/ML IV SOLN
INTRAVENOUS | Status: DC | PRN
  Administered 2022-08-22: 1 [IU] via INTRAVENOUS
  Administered 2022-08-22 (×2): 2 [IU] via INTRAVENOUS
  Administered 2022-08-22: 1 [IU] via INTRAVENOUS

## 2022-08-22 MED ORDER — FENTANYL BOLUS VIA INFUSION
25.0000 ug | INTRAVENOUS | Status: DC | PRN
  Administered 2022-08-22 – 2022-08-23 (×6): 100 ug via INTRAVENOUS
  Administered 2022-08-23 – 2022-08-24 (×4): 50 ug via INTRAVENOUS

## 2022-08-22 MED ORDER — PROPOFOL 10 MG/ML IV BOLUS
INTRAVENOUS | Status: AC
  Filled 2022-08-22: qty 20

## 2022-08-22 MED ORDER — VASOPRESSIN 20 UNIT/ML IV SOLN
INTRAVENOUS | Status: AC
  Filled 2022-08-22: qty 1

## 2022-08-22 MED ORDER — PROPOFOL 1000 MG/100ML IV EMUL
0.0000 ug/kg/min | INTRAVENOUS | Status: DC
  Administered 2022-08-23 (×3): 10 ug/kg/min via INTRAVENOUS
  Administered 2022-08-23: 5 ug/kg/min via INTRAVENOUS
  Filled 2022-08-22 (×5): qty 100

## 2022-08-22 MED ORDER — ORAL CARE MOUTH RINSE: 15.0000 mL | Freq: Once | OROMUCOSAL | Status: AC

## 2022-08-22 MED ORDER — PHENTOLAMINE MESYLATE 5 MG IJ SOLR
5.0000 mg | Freq: Once | INTRAMUSCULAR | Status: DC
  Filled 2022-08-22: qty 5

## 2022-08-22 MED ORDER — CHLORHEXIDINE GLUCONATE CLOTH 2 % EX PADS
6.0000 | MEDICATED_PAD | Freq: Every day | CUTANEOUS | Status: DC
  Administered 2022-08-22 – 2022-08-29 (×7): 6 via TOPICAL

## 2022-08-22 MED ORDER — DEXAMETHASONE SODIUM PHOSPHATE 10 MG/ML IJ SOLN
INTRAMUSCULAR | Status: DC | PRN
  Administered 2022-08-22: 5 mg via INTRAVENOUS

## 2022-08-22 MED ORDER — NOREPINEPHRINE 4 MG/250ML-% IV SOLN
INTRAVENOUS | Status: AC
  Administered 2022-08-22: 2 ug/min via INTRAVENOUS
  Filled 2022-08-22: qty 250

## 2022-08-22 MED ORDER — PROPOFOL 10 MG/ML IV BOLUS
INTRAVENOUS | Status: DC | PRN
  Administered 2022-08-22: 100 mg via INTRAVENOUS

## 2022-08-22 MED ORDER — FENTANYL CITRATE PF 50 MCG/ML IJ SOSY: 25.0000 ug | PREFILLED_SYRINGE | INTRAMUSCULAR | Status: DC | PRN

## 2022-08-22 MED ORDER — ONDANSETRON HCL 4 MG/2ML IJ SOLN: 4.0000 mg | Freq: Once | INTRAMUSCULAR | Status: DC | PRN

## 2022-08-22 MED ORDER — LIDOCAINE 2% (20 MG/ML) 5 ML SYRINGE
INTRAMUSCULAR | Status: DC | PRN
  Administered 2022-08-22: 60 mg via INTRAVENOUS

## 2022-08-22 MED ORDER — DOCUSATE SODIUM 50 MG/5ML PO LIQD: 100.0000 mg | Freq: Two times a day (BID) | ORAL | Status: DC

## 2022-08-22 MED ORDER — ORAL CARE MOUTH RINSE
15.0000 mL | OROMUCOSAL | Status: DC
  Administered 2022-08-22 – 2022-08-24 (×23): 15 mL via OROMUCOSAL

## 2022-08-22 MED ORDER — ALBUMIN HUMAN 5 % IV SOLN: INTRAVENOUS | Status: DC | PRN

## 2022-08-22 MED ORDER — LIDOCAINE 2% (20 MG/ML) 5 ML SYRINGE
INTRAMUSCULAR | Status: AC
  Filled 2022-08-22: qty 5

## 2022-08-22 MED ORDER — FENTANYL CITRATE (PF) 250 MCG/5ML IJ SOLN
INTRAMUSCULAR | Status: DC | PRN
  Administered 2022-08-22 (×5): 50 ug via INTRAVENOUS

## 2022-08-22 MED ORDER — LACTATED RINGERS IV SOLN: INTRAVENOUS | Status: DC

## 2022-08-22 MED ORDER — PHENYLEPHRINE 80 MCG/ML (10ML) SYRINGE FOR IV PUSH (FOR BLOOD PRESSURE SUPPORT)
PREFILLED_SYRINGE | INTRAVENOUS | Status: DC | PRN
  Administered 2022-08-22 (×3): 80 ug via INTRAVENOUS

## 2022-08-22 MED ORDER — FENTANYL 2500MCG IN NS 250ML (10MCG/ML) PREMIX INFUSION
25.0000 ug/h | INTRAVENOUS | Status: DC
  Administered 2022-08-22: 25 ug/h via INTRAVENOUS
  Administered 2022-08-23 (×2): 150 ug/h via INTRAVENOUS
  Filled 2022-08-22 (×3): qty 250

## 2022-08-22 MED ORDER — 0.9 % SODIUM CHLORIDE (POUR BTL) OPTIME
TOPICAL | Status: DC | PRN
  Administered 2022-08-22: 4000 mL

## 2022-08-22 MED ORDER — CHLORHEXIDINE GLUCONATE 0.12 % MT SOLN
15.0000 mL | Freq: Once | OROMUCOSAL | Status: AC
  Administered 2022-08-22: 15 mL via OROMUCOSAL

## 2022-08-22 MED ORDER — SODIUM BICARBONATE 8.4 % IV SOLN
INTRAVENOUS | Status: AC
  Filled 2022-08-22: qty 50

## 2022-08-22 MED ORDER — SODIUM CHLORIDE 0.9 % IV SOLN
250.0000 mL | INTRAVENOUS | Status: DC
  Administered 2022-08-22 – 2022-09-04 (×4): 250 mL via INTRAVENOUS

## 2022-08-22 MED ORDER — ROCURONIUM BROMIDE 10 MG/ML (PF) SYRINGE
PREFILLED_SYRINGE | INTRAVENOUS | Status: DC | PRN
  Administered 2022-08-22: 50 mg via INTRAVENOUS
  Administered 2022-08-22: 30 mg via INTRAVENOUS
  Administered 2022-08-22: 20 mg via INTRAVENOUS

## 2022-08-22 MED ORDER — POLYETHYLENE GLYCOL 3350 17 G PO PACK: 17.0000 g | PACK | Freq: Every day | ORAL | Status: DC

## 2022-08-22 MED ORDER — ORAL CARE MOUTH RINSE: 15.0000 mL | OROMUCOSAL | Status: DC | PRN

## 2022-08-22 MED ORDER — SODIUM BICARBONATE 8.4 % IV SOLN
INTRAVENOUS | Status: DC | PRN
  Administered 2022-08-22 (×2): 50 meq via INTRAVENOUS

## 2022-08-22 MED ORDER — SUCCINYLCHOLINE CHLORIDE 200 MG/10ML IV SOSY
PREFILLED_SYRINGE | INTRAVENOUS | Status: DC | PRN
  Administered 2022-08-22: 80 mg via INTRAVENOUS

## 2022-08-22 MED ORDER — AMISULPRIDE (ANTIEMETIC) 5 MG/2ML IV SOLN: 10.0000 mg | Freq: Once | INTRAVENOUS | Status: DC | PRN

## 2022-08-22 MED ORDER — METRONIDAZOLE 500 MG/100ML IV SOLN
500.0000 mg | Freq: Two times a day (BID) | INTRAVENOUS | Status: DC
  Administered 2022-08-22 – 2022-09-04 (×25): 500 mg via INTRAVENOUS
  Filled 2022-08-22 (×26): qty 100

## 2022-08-22 MED ORDER — NOREPINEPHRINE 4 MG/250ML-% IV SOLN: 2.0000 ug/min | INTRAVENOUS | Status: DC

## 2022-08-22 MED ORDER — LACTATED RINGERS IV BOLUS
500.0000 mL | Freq: Once | INTRAVENOUS | Status: AC
  Administered 2022-08-22: 500 mL via INTRAVENOUS

## 2022-08-22 MED ORDER — METRONIDAZOLE 500 MG/100ML IV SOLN
INTRAVENOUS | Status: AC
  Administered 2022-08-23: 500 mg via INTRAVENOUS
  Filled 2022-08-22: qty 100

## 2022-08-22 MED ORDER — SODIUM CHLORIDE 0.9 % IV SOLN
2.0000 g | Freq: Three times a day (TID) | INTRAVENOUS | Status: DC
  Administered 2022-08-23 (×2): 2 g via INTRAVENOUS
  Filled 2022-08-22 (×3): qty 12.5

## 2022-08-22 MED ORDER — POTASSIUM CHLORIDE IN NACL 20-0.9 MEQ/L-% IV SOLN
INTRAVENOUS | Status: DC
  Filled 2022-08-22: qty 1000

## 2022-08-22 MED ORDER — PHENYLEPHRINE HCL-NACL 20-0.9 MG/250ML-% IV SOLN
25.0000 ug/min | INTRAVENOUS | Status: DC
  Administered 2022-08-22 (×2): 120 ug/min via INTRAVENOUS
  Administered 2022-08-23: 80 ug/min via INTRAVENOUS
  Filled 2022-08-22 (×4): qty 250

## 2022-08-22 MED ORDER — FENTANYL CITRATE (PF) 100 MCG/2ML IJ SOLN: 25.0000 ug | INTRAMUSCULAR | Status: DC | PRN

## 2022-08-22 MED ORDER — IOHEXOL 350 MG/ML SOLN
75.0000 mL | Freq: Once | INTRAVENOUS | Status: AC | PRN
  Administered 2022-08-22: 75 mL via INTRAVENOUS

## 2022-08-22 MED ORDER — CHLORHEXIDINE GLUCONATE 0.12 % MT SOLN
OROMUCOSAL | Status: AC
  Filled 2022-08-22: qty 15

## 2022-08-22 MED ORDER — PHENYLEPHRINE HCL-NACL 20-0.9 MG/250ML-% IV SOLN
INTRAVENOUS | Status: DC | PRN
  Administered 2022-08-22: 40 ug/min via INTRAVENOUS

## 2022-08-22 MED ORDER — FENTANYL CITRATE PF 50 MCG/ML IJ SOSY
25.0000 ug | PREFILLED_SYRINGE | INTRAMUSCULAR | Status: DC | PRN
  Administered 2022-08-22: 50 ug via INTRAVENOUS
  Filled 2022-08-22: qty 1

## 2022-08-22 MED ORDER — INDOCYANINE GREEN 25 MG IV SOLR
INTRAVENOUS | Status: DC | PRN
  Administered 2022-08-22: 5 mg via INTRAVENOUS

## 2022-08-22 MED ORDER — FAMOTIDINE 20 MG PO TABS: 20.0000 mg | ORAL_TABLET | Freq: Two times a day (BID) | ORAL | Status: DC

## 2022-08-22 MED ORDER — ACETAMINOPHEN 10 MG/ML IV SOLN: 1000.0000 mg | Freq: Once | INTRAVENOUS | Status: DC | PRN

## 2022-08-22 MED ORDER — NITROGLYCERIN 2 % TD OINT
1.0000 [in_us] | TOPICAL_OINTMENT | Freq: Three times a day (TID) | TRANSDERMAL | Status: DC
  Filled 2022-08-22: qty 30

## 2022-08-22 MED ORDER — LACTATED RINGERS IV SOLN: INTRAVENOUS | Status: DC | PRN

## 2022-08-22 SURGICAL SUPPLY — 52 items
APL PRP STRL LF DISP 70% ISPRP (MISCELLANEOUS) ×1
BLADE CLIPPER SURG (BLADE) IMPLANT
BNDG GAUZE DERMACEA FLUFF 4 (GAUZE/BANDAGES/DRESSINGS) IMPLANT
BNDG GZE DERMACEA 4 6PLY (GAUZE/BANDAGES/DRESSINGS) ×1
CANISTER SUCT 3000ML PPV (MISCELLANEOUS) ×1 IMPLANT
CHLORAPREP W/TINT 26 (MISCELLANEOUS) ×1 IMPLANT
COVER SURGICAL LIGHT HANDLE (MISCELLANEOUS) ×1 IMPLANT
DRAIN CHANNEL 19F RND (DRAIN) IMPLANT
DRAPE LAPAROSCOPIC ABDOMINAL (DRAPES) ×1 IMPLANT
DRAPE UNIVERSAL (DRAPES) ×1 IMPLANT
DRAPE WARM FLUID 44X44 (DRAPES) ×1 IMPLANT
DRSG OPSITE POSTOP 4X10 (GAUZE/BANDAGES/DRESSINGS) IMPLANT
DRSG OPSITE POSTOP 4X8 (GAUZE/BANDAGES/DRESSINGS) IMPLANT
ELECT BLADE 6.5 EXT (BLADE) IMPLANT
ELECT CAUTERY BLADE 6.4 (BLADE) ×1 IMPLANT
ELECT REM PT RETURN 9FT ADLT (ELECTROSURGICAL) ×1
ELECTRODE REM PT RTRN 9FT ADLT (ELECTROSURGICAL) ×1 IMPLANT
EVACUATOR SILICONE 100CC (DRAIN) IMPLANT
GAUZE SPONGE 4X4 12PLY STRL (GAUZE/BANDAGES/DRESSINGS) IMPLANT
GLOVE BIO SURGEON STRL SZ 6.5 (GLOVE) ×1 IMPLANT
GLOVE BIOGEL PI IND STRL 6 (GLOVE) ×1 IMPLANT
GOWN STRL REUS W/ TWL LRG LVL3 (GOWN DISPOSABLE) ×2 IMPLANT
GOWN STRL REUS W/TWL LRG LVL3 (GOWN DISPOSABLE) ×2
HANDLE SUCTION POOLE (INSTRUMENTS) ×1 IMPLANT
KIT BASIN OR (CUSTOM PROCEDURE TRAY) ×1 IMPLANT
KIT OSTOMY DRAINABLE 2.75 STR (WOUND CARE) IMPLANT
KIT TURNOVER KIT B (KITS) ×1 IMPLANT
LIGASURE IMPACT 36 18CM CVD LR (INSTRUMENTS) IMPLANT
NS IRRIG 1000ML POUR BTL (IV SOLUTION) ×2 IMPLANT
PACK GENERAL/GYN (CUSTOM PROCEDURE TRAY) ×1 IMPLANT
PACK SPY-PHI (KITS) IMPLANT
PAD ABD 8X10 STRL (GAUZE/BANDAGES/DRESSINGS) IMPLANT
PAD ARMBOARD 7.5X6 YLW CONV (MISCELLANEOUS) ×1 IMPLANT
PENCIL SMOKE EVACUATOR (MISCELLANEOUS) ×1 IMPLANT
RELOAD PROXIMATE 75MM BLUE (ENDOMECHANICALS) ×2 IMPLANT
RELOAD STAPLE 75 3.8 BLU REG (ENDOMECHANICALS) IMPLANT
SPONGE T-LAP 18X18 ~~LOC~~+RFID (SPONGE) IMPLANT
STAPLER PROXIMATE 75MM BLUE (STAPLE) IMPLANT
STAPLER VISISTAT 35W (STAPLE) ×1 IMPLANT
SUCTION POOLE HANDLE (INSTRUMENTS) ×1
SUT ETHILON 2 0 PSLX (SUTURE) IMPLANT
SUT PDS AB 1 TP1 54 (SUTURE) IMPLANT
SUT PDS AB 1 TP1 96 (SUTURE) IMPLANT
SUT SILK 2 0 SH CR/8 (SUTURE) ×1 IMPLANT
SUT SILK 2 0 TIES 10X30 (SUTURE) ×1 IMPLANT
SUT SILK 3 0 SH CR/8 (SUTURE) ×1 IMPLANT
SUT SILK 3 0 TIES 10X30 (SUTURE) ×1 IMPLANT
SUT VIC AB 3-0 SH 18 (SUTURE) IMPLANT
SUT VIC AB 3-0 SH 8-18 (SUTURE) IMPLANT
TOWEL GREEN STERILE (TOWEL DISPOSABLE) ×1 IMPLANT
TRAY FOLEY MTR SLVR 16FR STAT (SET/KITS/TRAYS/PACK) IMPLANT
YANKAUER SUCT BULB TIP NO VENT (SUCTIONS) IMPLANT

## 2022-08-22 NOTE — Progress Notes (Signed)
Daily Progress Note  DOA: 08/18/2022 Hospital Day: 5 Chief Complaint: iron deficiency anemia / pelvic mass ( suspected colon)   Assessment and Plan:    Patient is a 72 y.o. year old female with a past medical history not limited to thrombocytosis, HTN, asthma and anxiety. Presented with persistent profound iron deficiency anemia, heme + stools, recent onset of nausea / vomiting. Imaging shows pelvic mass concerning for sigmoid lesio  -Plan was for EGD / colonoscopy today. She vomited after 1/2 of the bowel prep. No stool output. Procedures postponed until tomorrow but it is clear that she cannot tolerate any more prep. Appears to have worsening obstruction with increased abdominal pain, hyperactive bowel sounds and no passage of flatus  Surgical Evaluation ?  - Hgb stable at 8.4  post transfusion.    Chronic thrombocytosis. Previously evaluated by Hematology and JAK2 study was negative. Felt to be reactive.   Tiny, acute brain infarcts. Neurology evaluating.  -Work up in progress.    Moderate to severe R hydronephrosis secondary to right pelvic mass ( colonic vrs GYN)  -Urology evaluated She doesn't want stent or other intervention which is reasonable in absence of pain and good renal function    Concern for cirrhosis of RUQ [place].  -Outpatient workup is appropriate  Subjective / New Events:    Severe "gas" pain. Took 1/2 of the bowel prep and vomited. No flatus.   Objective:   Recent Labs    08/20/22 0321 08/21/22 0807 08/22/22 0334  WBC 14.1* 14.8* 11.2*  HGB 8.2* 8.8* 8.4*  HCT 26.9* 30.1* 28.8*  PLT 888* 935* 879*   BMET Recent Labs    08/20/22 0321 08/21/22 0807 08/21/22 1440 08/22/22 0334  NA 137 141  --  139  K 2.9* 2.9* 3.7 3.7  CL 109 110  --  113*  CO2 18* 17*  --  15*  GLUCOSE 105* 90  --  83  BUN 15 16  --  16  CREATININE 0.76 0.77  --  0.80  CALCIUM 8.2* 8.5*  --  8.3*   LFT Recent Labs    08/20/22 0321  PROT 6.0*  ALBUMIN 2.6*  AST 9*   ALT 9  ALKPHOS 75  BILITOT 0.6   PT/INR Recent Labs    08/21/22 0259  LABPROT 14.4  INR 1.1     Imaging:  VAS [place] TRANSCRANIAL DOPPLER  Transcranial Doppler  Patient Name:  Debra Sharp  Date of Exam:   08/21/2022 Medical Rec #: [id number]      Accession #:    [id number] Date of Birth: 1950-12-22      Patient Gender: F Patient Age:   72 years Exam Location:  [facility] Procedure:      VAS [place] TRANSCRANIAL DOPPLER Referring Phys: Osvaldo Shipper  --------------------------------------------------------------------------------   Indications: Stroke. Comparison Study: No prior studies.  Performing Technologist: Jean Rosenthal RDMS, RVT    Examination Guidelines: A complete evaluation includes B-mode imaging, spectral Doppler, color Doppler, and power Doppler as needed of all accessible portions of each vessel. Bilateral testing is considered an integral part of a complete examination. Limited examinations for reoccurring indications may be performed as noted.    +----------+---------------+----------+-----------+------------------+ RIGHT TCD Right VM (cm/s)Depth (cm)Pulsatility     Comment       +----------+---------------+----------+-----------+------------------+ MCA             79  1.17                       +----------+---------------+----------+-----------+------------------+ ACA             -36                   1.13                       +----------+---------------+----------+-----------+------------------+ Term ICA        53                    1.11                       +----------+---------------+----------+-----------+------------------+ PCA P1          -15                   0.46                       +----------+---------------+----------+-----------+------------------+ Opthalmic       26                    1.81                        +----------+---------------+----------+-----------+------------------+ ICA siphon      63                    1.30                       +----------+---------------+----------+-----------+------------------+ Vertebral                                     Bidirectional flow +----------+---------------+----------+-----------+------------------+    +----------+--------------+----------+-----------+------------------+ LEFT TCD  Left VM (cm/s)Depth (cm)Pulsatility     Comment       +----------+--------------+----------+-----------+------------------+ MCA            101                   1.21                       +----------+--------------+----------+-----------+------------------+ ACA                                          Unable to insonate +----------+--------------+----------+-----------+------------------+ Term ICA        26                   1.45                       +----------+--------------+----------+-----------+------------------+ PCA P1          33                   1.04                       +----------+--------------+----------+-----------+------------------+ Opthalmic       23                   1.92                       +----------+--------------+----------+-----------+------------------+  ICA siphon      45                   1.34                       +----------+--------------+----------+-----------+------------------+ Vertebral      -32                   1.29                       +----------+--------------+----------+-----------+------------------+    +------------+-------+-------+             VM cm/sComment +------------+-------+-------+ Prox Basilar  -43          +------------+-------+-------+ Dist Basilar  -11          +------------+-------+-------+  Summary:    Elevated left middle cerebral artery mean flow velocities suggest mild stenosis.Mildy elevated right middle cerebral artery mean  flow velcoities of unclear significance *See table(s) above for TCD measurements and observations.   Diagnosing physician: Delia Heady MD Electronically signed by Delia Heady MD on 08/21/2022 at 8:01:56 PM.      Final   VAS US CAROTID Carotid Arterial Duplex Study  Patient Name:  LATRAVIA KEFAUVER  Date of Exam:   08/21/2022 Medical Rec #: [id number]      Accession #:    [id number] Date of Birth: [birth year]      Patient Gender: F Patient Age:   72 years Exam Location:  [facility] Procedure:      VAS US CAROTID Referring Phys: Osvaldo Shipper  --------------------------------------------------------------------------------   Indications:       CVA. Risk Factors:      Hypertension, hyperlipidemia. Comparison Study:  No prior studies.  Performing Technologist: Jean Rosenthal RDMS, RVT    Examination Guidelines: A complete evaluation includes B-mode imaging, spectral Doppler, color Doppler, and power Doppler as needed of all accessible portions of each vessel. Bilateral testing is considered an integral part of a complete examination. Limited examinations for reoccurring indications may be performed as noted.    Right Carotid Findings: +----------+--------+--------+--------+------------------+---------+           PSV cm/sEDV cm/sStenosisPlaque DescriptionComments  +----------+--------+--------+--------+------------------+---------+ CCA Prox  185     18                                Turbulent +----------+--------+--------+--------+------------------+---------+ CCA Distal129     22                                          +----------+--------+--------+--------+------------------+---------+ ICA Prox  147     31      1-39%   calcific          tortuous  +----------+--------+--------+--------+------------------+---------+ ICA Distal118     27                                           +----------+--------+--------+--------+------------------+---------+ ECA       111     16                                          +----------+--------+--------+--------+------------------+---------+  +----------+--------+-------+--------+-------------------+  PSV cm/sEDV cmsDescribeArm Pressure (mmHG) +----------+--------+-------+--------+-------------------+ [id number]            Stenotic                    +----------+--------+-------+--------+-------------------+  +---------+--------+--+--------+---------------+ VertebralPSV cm/s21EDV cm/sBi- directional +---------+--------+--+--------+---------------+     Left Carotid Findings: +----------+--------+--------+--------+------------------+--------+           PSV cm/sEDV cm/sStenosisPlaque DescriptionComments +----------+--------+--------+--------+------------------+--------+ CCA Prox  67      16                                         +----------+--------+--------+--------+------------------+--------+ CCA Distal77      17                                         +----------+--------+--------+--------+------------------+--------+ ICA Prox  89      25      1-39%   calcific                   +----------+--------+--------+--------+------------------+--------+ ICA Distal95      31                                         +----------+--------+--------+--------+------------------+--------+ ECA       80                                                 +----------+--------+--------+--------+------------------+--------+  +----------+--------+--------+----------------+-------------------+           PSV cm/sEDV cm/sDescribe        Arm Pressure (mmHG) +----------+--------+--------+----------------+-------------------+ [id number]              Multiphasic, WNL                     +----------+--------+--------+----------------+-------------------+  +---------+--------+--+--------+--+---------+ VertebralPSV cm/s74EDV cm/s14Antegrade +---------+--------+--+--------+--+---------+        Summary: Right Carotid: Velocities in the right ICA are consistent with a 1-39% stenosis.  Left Carotid: Velocities in the left ICA are consistent with a 1-39% stenosis.  Vertebrals:  Left vertebral artery demonstrates antegrade flow. Right vertebral              artery demonstrates bidirectional flow. Subclavians: Right subclavian artery was stenotic. Normal flow hemodynamics were              seen in the left subclavian artery.  *See table(s) above for measurements and observations.    Electronically signed by Delia Heady MD on 08/21/2022 at 7:57:56 PM.      Final   DG Abd Portable 1V CLINICAL DATA:  [id number] Nausea and vomiting [id number]  EXAM: PORTABLE ABDOMEN - 1 VIEW  COMPARISON:  CT 08/19/2022  FINDINGS: Persistent dilated loops of colon. A few gas distended small bowel loops in the left mid abdomen. Calcified uterine fibroid.  IMPRESSION: Persistent dilated colon suggesting distal obstruction.  Electronically Signed   By: Corlis Leak M.D.   On: 08/21/2022 12:51     Scheduled inpatient medications:   sodium chloride   Intravenous Once   bisacodyl  5 mg Oral Once   mometasone-formoterol  2 puff Inhalation BID   pantoprazole (PROTONIX) IV  40 mg Intravenous Q12H   peg 3350 powder  0.5 kit Oral Once   sertraline  150 mg Oral q AM   Continuous inpatient infusions:   cefTRIAXone (ROCEPHIN)  IV 1 g (08/22/22 1015)   PRN inpatient medications: acetaminophen **OR** acetaminophen, albuterol, hydrALAZINE, HYDROmorphone (DILAUDID) injection, LORazepam, ondansetron **OR** ondansetron (ZOFRAN) IV  Vital signs in last 24 hours: Temp:  [97.7 F (36.5 C)-98.7 F (37.1 C)] 97.7 F (36.5 C) (07/06 0759) Pulse Rate:  [78-109] 101 (07/06 0759) Resp:   [15-18] 18 (07/06 0759) BP: (128-161)/(62-83) 161/71 (07/06 0759) SpO2:  [99 %-100 %] 100 % (07/06 0759) Last BM Date : 08/21/22  Intake/Output Summary (Last 24 hours) at 08/22/2022 1153 Last data filed at 08/22/2022 0401 Gross per 24 hour  Intake 1196.84 ml  Output --  Net 1196.84 ml    Intake/Output from previous day: 07/05 0701 - 07/06 0700 In: 1196.8 [P.O.:600; IV Piggyback:596.8] Out: -  Intake/Output this shift: No intake/output data recorded.   Physical Exam:  General: Alert female in NAD Heart:  Regular rate and rhythm.  Pulmonary: Normal respiratory effort Abdomen: Soft, mild-moderately distended. Hyperactive  bowel sounds. Neurologic: Alert and oriented   Principal Problem:   GI bleed Active Problems:   Symptomatic anemia   Thrombocytosis   Iron deficiency anemia   Abnormal CT of the abdomen   Colon distention   History of cardioembolic cerebrovascular accident (CVA)   Silent cerebral infarction (HCC)     LOS: 4 days   Willette Cluster ,NP 08/22/2022, 11:53 AM

## 2022-08-22 NOTE — Progress Notes (Signed)
Pharmacy Antibiotic Note  Debra Sharp is a 72 y.o. female admitted on 08/18/2022 with  possible intra-abd infection .  Pharmacy has been consulted for Cefepime dosing. Pt also on Flagyl.  Plan: Cefepime 2gm IV q8h Will f/u renal function, micro data, and pt's clinical condition   Height: 5\' 6"  (167.6 cm) Weight: 77.1 kg (169 lb 15.6 oz) IBW/kg (Calculated) : 59.3  Temp (24hrs), Avg:98 F (36.7 C), Min:97.7 F (36.5 C), Max:98.7 F (37.1 C)  Recent Labs  Lab 08/18/22 0946 08/19/22 0208 08/20/22 0321 08/21/22 0807 08/22/22 0334  WBC 14.4* 13.9* 14.1* 14.8* 11.2*  CREATININE 0.84 0.83 0.76 0.77 0.80    Estimated Creatinine Clearance: 68.6 mL/min (by C-G formula based on SCr of 0.8 mg/dL).    Allergies  Allergen Reactions   Aspirin Other (See Comments)    Caused an asthma attack   Ibuprofen Swelling and Other (See Comments)    Angioedema and Asthma exacerbation   Sulfonamide Derivatives Swelling and Other (See Comments)    Face swelled SEVERELY   Penicillins Rash    Antimicrobials this admission: 7/3  Rocephin >> 7/6 7/6 Flagyl >>  7/6 Cefepime >>  Thank you for allowing pharmacy to be a part of this patient's care.  Christoper Fabian, PharmD, BCPS Please see amion for complete clinical pharmacist phone list 08/22/2022 5:13 PM

## 2022-08-22 NOTE — Anesthesia Procedure Notes (Signed)
Arterial Line Insertion Start/End7/07/2022 6:45 PM, 08/22/2022 6:50 PM Performed by: Leonides Grills, MD, anesthesiologist  Patient location: OR. Preanesthetic checklist: patient identified, IV checked, site marked, risks and benefits discussed, surgical consent, monitors and equipment checked, pre-op evaluation, timeout performed and anesthesia consent Left, radial was placed Catheter size: 20 G Hand hygiene performed , maximum sterile barriers used  and Seldinger technique used  Attempts: 1 Procedure performed using ultrasound guided technique. Ultrasound Notes:anatomy identified, needle tip was noted to be adjacent to the nerve/plexus identified and no ultrasound evidence of intravascular and/or intraneural injection Following insertion, dressing applied and Biopatch. Post procedure assessment: normal and unchanged  Patient tolerated the procedure well with no immediate complications.

## 2022-08-22 NOTE — Progress Notes (Addendum)
[facility] PROGRESS NOTE   Lajeanna Lancon BJY:[id number] DOB: 25-Nov-1950 DOA: 08/18/2022  PCP: Clayborn Heron, MD  Brief History/Interval Summary: 72 y.o. female with medical history significant of iron deficiency anemia, HTN, HLD, presented with worsening of generalized weakness, and new onset of abdominal pain and vomiting. Patient was diagnosed with iron deficiency anemia in March 2024, received PRBC and discharged home with p.o. iron supplement.  Patient gave history of weight loss.  She has never had GI workup previously.  CT scan also showed incidental age-indeterminate infarct.  She was hospitalized for further management.  Consultants: Gastroenterology.  Neurology  Procedures: Echocardiogram.    Subjective/Interval History: Patient apparently did not take her colonoscopy prep last night.  Complains of abdominal discomfort which she attributes to acid reflux.  Also had 2 episodes of emesis in the last 24 hours.  No other complaints offered.  Denies any worsening in her abdominal pain.   Assessment/Plan:  Acute on chronic iron deficiency anemia/concern for GI bleed Patient was given blood transfusion.  Hemoglobin responded and has been stable for the last 2 days. There is suspicion for GI blood loss. Stool for occult blood is noted to be positive.   Seen by gastroenterology.  Plan was for EGD and colonoscopy today however patient did not take her prep last night.  GI to talk to her again.    Colonic distention with concern for sigmoid colon mass Patient presented with nausea.  Right upper quadrant ultrasound showed distended gallbladder and right-sided hydronephrosis CT of the abdomen pelvis was done which showed colonic distention with concern for a sigmoid colon mass.   The mass appears to be causing some obstruction.  Did not take her prep yesterday so cannot do colonoscopy today.  Nausea is likely due to her colonic distention.   No abnormalities noted in the  gallbladder on CT scan. Continue with PPI. CT was repeated which raised concern for perforation. Discussed with general surgery who will take her to OR.  Right-sided hydronephrosis with abnormal UA Severe right-sided hydronephrosis and hydroureter with dilated ureters noted on the CT scan as well.  This is to the level of the right pelvic ill-defined hypodensity which could be due to the right pelvic mass.  Renal function is normal.   Discussed with urology.  Appreciate their input.  No interventions planned at this time.  Continue ceftriaxone for now.  It does not appear that urine culture was ever sent.  Will give her a 5-day treatment.  Acute stroke Incidentally noted on CT scan.  MRI shows tiny infarcts.  Patient without any focal neurological deficits.  No history of strokes previously.   There is an echocardiogram report from March which showed EF of 30 to 35%.  Large pericardial effusion was noted at that time.  Wall motion abnormalities were noted.  Echocardiogram was repeated which showed EF of 40 to 45%.  Global hypokinesis was noted.  No pericardial effusion noted on this echocardiogram. LDL 94.  Will need statin therapy at discharge. HbA1c is 4.9.   Carotid Dopplers and transcranial Dopplers did not show any significant stenosis. Patient is allergic to aspirin.  Needs to be on Plavix once GI workup has been completed.  Chronic systolic CHF Echocardiogram from March showed EF of 30 to 35% with large pericardial effusion.  Echocardiogram repeated and shows a EF of 40 to 45%.  No significant pericardial effusion noted this time around.  No wall motion abnormalities noted.  Global hypokinesis was seen.  Patient will need to be established with cardiology after discharge. Currently she is euvolemic. Will consider goal-directed medical treatment depending on workup mentioned above.  Thrombocythemia This was worked up during previous admission in March.  JAK2 study was negative.   Probably reactive from iron deficiency.  Hypokalemia Potassium level is better today.  Magnesium was 2.0 yesterday.  Normal anion gap metabolic acidosis Low bicarbonate levels noted.  Continue to monitor for now.  No clear indication for sodium bicarbonate at this time.  Essential hypertension Holding antihypertensives for now.  Uterine fibroid Incidentally noted on imaging studies.  DVT Prophylaxis: SCDs Code Status: Full code Family Communication: Discussed with patient Disposition Plan: To be determined  Status is: Inpatient Remains inpatient appropriate because: Severe anemia, acute stroke, UTI     Medications: Scheduled:  sodium chloride   Intravenous Once   bisacodyl  5 mg Oral Once   mometasone-formoterol  2 puff Inhalation BID   pantoprazole (PROTONIX) IV  40 mg Intravenous Q12H   peg 3350 powder  0.5 kit Oral Once   sertraline  150 mg Oral q AM   Continuous:  0.9 % NaCl with KCl 20 mEq / L 75 mL/hr at 08/21/22 1707   cefTRIAXone (ROCEPHIN)  IV 1 g (08/21/22 1124)   ZOX:[id number] **OR** acetaminophen, albuterol, hydrALAZINE, HYDROmorphone (DILAUDID) injection, LORazepam, ondansetron **OR** ondansetron (ZOFRAN) IV  Antibiotics: Anti-infectives (From admission, onward)    Start     Dose/Rate Route Frequency Ordered Stop   08/19/22 1030  cefTRIAXone (ROCEPHIN) 1 g in sodium chloride 0.9 % 100 mL IVPB        1 g 200 mL/hr over 30 Minutes Intravenous Every 24 hours 08/19/22 0937         Objective:  Vital Signs  Vitals:   08/21/22 1603 08/21/22 2136 08/22/22 0358 08/22/22 0759  BP: (!) 145/64 (!) 140/62 128/83 (!) 161/71  Pulse: 78  (!) 109 (!) 101  Resp: 15 17 16 18   Temp: 98 F (36.7 C) 98.7 F (37.1 C) 97.7 F (36.5 C) 97.7 F (36.5 C)  TempSrc: Oral Oral Oral Oral  SpO2: 99% 99% 100% 100%  Weight:      Height:        Intake/Output Summary (Last 24 hours) at 08/22/2022 0948 Last data filed at 08/22/2022 0401 Gross per 24 hour  Intake  1196.84 ml  Output --  Net 1196.84 ml    Filed Weights   08/18/22 0941  Weight: 77.1 kg    General appearance: Awake alert.  In no distress Resp: Clear to auscultation bilaterally.  Normal effort Cardio: S1-S2 is normal regular.  No S3-S4.  No rubs murmurs or bruit GI: Abdomen is soft.  Really distended.  Tender in the epigastric area without any rebound rigidity or guarding.  No masses organomegaly. No Focal neurological deficits noted.     Lab Results:  Data Reviewed: I have personally reviewed following labs and reports of the imaging studies  CBC: Recent Labs  Lab 08/18/22 0946 08/18/22 2248 08/19/22 0208 08/19/22 1013 08/20/22 0321 08/21/22 0807 08/22/22 0334  WBC 14.4*  --  13.9*  --  14.1* 14.8* 11.2*  HGB 4.6*   < > 7.0* 8.7* 8.2* 8.8* 8.4*  HCT 17.7*   < > 23.6* 28.1* 26.9* 30.1* 28.8*  MCV 68.3*  --  73.3*  --  74.3* 77.2* 78.7*  PLT 1,255*  --  997*  --  888* 935* 879*   < > = values in this interval not displayed.  Basic Metabolic Panel: Recent Labs  Lab 08/18/22 0946 08/19/22 0208 08/20/22 0321 08/20/22 0722 08/21/22 0807 08/21/22 1440 08/22/22 0334  NA 137 136 137  --  141  --  139  K 2.8* 3.4* 2.9*  --  2.9* 3.7 3.7  CL 105 107 109  --  110  --  113*  CO2 21* 19* 18*  --  17*  --  15*  GLUCOSE 118* 106* 105*  --  90  --  83  BUN 14 14 15   --  16  --  16  CREATININE 0.84 0.83 0.76  --  0.77  --  0.80  CALCIUM 8.4* 8.2* 8.2*  --  8.5*  --  8.3*  MG 2.0  --   --  2.2 2.0  --   --      GFR: Estimated Creatinine Clearance: 68.6 mL/min (by C-G formula based on SCr of 0.8 mg/dL).   CBG: Recent Labs  Lab 08/18/22 1010  GLUCAP 106*     Radiology Studies: VAS [place] TRANSCRANIAL DOPPLER  Result Date: 08/21/2022  Transcranial Doppler Patient Name:  JAQUASIA SMEE  Date of Exam:   08/21/2022 Medical Rec #: [id number]      Accession #:    [id number] Date of Birth: 1950-06-14      Patient Gender: F Patient Age:   59 years Exam Location:  [facility] Procedure:      VAS [place] TRANSCRANIAL DOPPLER Referring Phys: Osvaldo Shipper --------------------------------------------------------------------------------  Indications: Stroke. Comparison Study: No prior studies. Performing Technologist: Jean Rosenthal RDMS, RVT  Examination Guidelines: A complete evaluation includes B-mode imaging, spectral Doppler, color Doppler, and power Doppler as needed of all accessible portions of each vessel. Bilateral testing is considered an integral part of a complete examination. Limited examinations for reoccurring indications may be performed as noted.  +----------+---------------+----------+-----------+------------------+ RIGHT TCD Right VM (cm/s)Depth (cm)Pulsatility     Comment       +----------+---------------+----------+-----------+------------------+ MCA             79                    1.17                       +----------+---------------+----------+-----------+------------------+ ACA             -36                   1.13                       +----------+---------------+----------+-----------+------------------+ Term ICA        53                    1.11                       +----------+---------------+----------+-----------+------------------+ PCA P1          -15                   0.46                       +----------+---------------+----------+-----------+------------------+ Opthalmic       26                    1.81                       +----------+---------------+----------+-----------+------------------+  ICA siphon      63                    1.30                       +----------+---------------+----------+-----------+------------------+ Vertebral                                     Bidirectional flow +----------+---------------+----------+-----------+------------------+  +----------+--------------+----------+-----------+------------------+ LEFT TCD  Left VM (cm/s)Depth (cm)Pulsatility     Comment        +----------+--------------+----------+-----------+------------------+ MCA            101                   1.21                       +----------+--------------+----------+-----------+------------------+ ACA                                          Unable to insonate +----------+--------------+----------+-----------+------------------+ Term ICA        26                   1.45                       +----------+--------------+----------+-----------+------------------+ PCA P1          33                   1.04                       +----------+--------------+----------+-----------+------------------+ Opthalmic       23                   1.92                       +----------+--------------+----------+-----------+------------------+ ICA siphon      45                   1.34                       +----------+--------------+----------+-----------+------------------+ Vertebral      -32                   1.29                       +----------+--------------+----------+-----------+------------------+  +------------+-------+-------+             VM cm/sComment +------------+-------+-------+ Prox Basilar  -43          +------------+-------+-------+ Dist Basilar  -11          +------------+-------+-------+ Summary:  Elevated left middle cerebral artery mean flow velocities suggest mild stenosis.Mildy elevated right middle cerebral artery mean flow velcoities of unclear significance *See table(s) above for TCD measurements and observations.  Diagnosing physician: Delia Heady MD Electronically signed by Delia Heady MD on 08/21/2022 at 8:01:56 PM.    Final    VAS US CAROTID  Result Date: 08/21/2022 Carotid Arterial Duplex Study Patient Name:  TYA LISANTI  Date of Exam:   08/21/2022 Medical Rec #: [id number]  Accession #:    [id number] Date of Birth: [birth year]      Patient Gender: F Patient Age:   72 years Exam Location:  [facility] Procedure:      VAS US  CAROTID Referring Phys: Osvaldo Shipper --------------------------------------------------------------------------------  Indications:       CVA. Risk Factors:      Hypertension, hyperlipidemia. Comparison Study:  No prior studies. Performing Technologist: Jean Rosenthal RDMS, RVT  Examination Guidelines: A complete evaluation includes B-mode imaging, spectral Doppler, color Doppler, and power Doppler as needed of all accessible portions of each vessel. Bilateral testing is considered an integral part of a complete examination. Limited examinations for reoccurring indications may be performed as noted.  Right Carotid Findings: +----------+--------+--------+--------+------------------+---------+           PSV cm/sEDV cm/sStenosisPlaque DescriptionComments  +----------+--------+--------+--------+------------------+---------+ CCA Prox  185     18                                Turbulent +----------+--------+--------+--------+------------------+---------+ CCA Distal129     22                                          +----------+--------+--------+--------+------------------+---------+ ICA Prox  147     31      1-39%   calcific          tortuous  +----------+--------+--------+--------+------------------+---------+ ICA Distal118     27                                          +----------+--------+--------+--------+------------------+---------+ ECA       111     16                                          +----------+--------+--------+--------+------------------+---------+ +----------+--------+-------+--------+-------------------+           PSV cm/sEDV cmsDescribeArm Pressure (mmHG) +----------+--------+-------+--------+-------------------+ [id number]            Stenotic                    +----------+--------+-------+--------+-------------------+ +---------+--------+--+--------+---------------+ VertebralPSV cm/s21EDV cm/sBi- directional  +---------+--------+--+--------+---------------+  Left Carotid Findings: +----------+--------+--------+--------+------------------+--------+           PSV cm/sEDV cm/sStenosisPlaque DescriptionComments +----------+--------+--------+--------+------------------+--------+ CCA Prox  67      16                                         +----------+--------+--------+--------+------------------+--------+ CCA Distal77      17                                         +----------+--------+--------+--------+------------------+--------+ ICA Prox  89      25      1-39%   calcific                   +----------+--------+--------+--------+------------------+--------+ ICA Distal95      31                                         +----------+--------+--------+--------+------------------+--------+  ECA       80                                                 +----------+--------+--------+--------+------------------+--------+ +----------+--------+--------+----------------+-------------------+           PSV cm/sEDV cm/sDescribe        Arm Pressure (mmHG) +----------+--------+--------+----------------+-------------------+ [id number]              Multiphasic, WNL                    +----------+--------+--------+----------------+-------------------+ +---------+--------+--+--------+--+---------+ VertebralPSV cm/s74EDV cm/s14Antegrade +---------+--------+--+--------+--+---------+   Summary: Right Carotid: Velocities in the right ICA are consistent with a 1-39% stenosis. Left Carotid: Velocities in the left ICA are consistent with a 1-39% stenosis. Vertebrals:  Left vertebral artery demonstrates antegrade flow. Right vertebral              artery demonstrates bidirectional flow. Subclavians: Right subclavian artery was stenotic. Normal flow hemodynamics were              seen in the left subclavian artery. *See table(s) above for measurements and observations.  Electronically signed by  Delia Heady MD on 08/21/2022 at 7:57:56 PM.    Final    DG Abd Portable 1V  Result Date: 08/21/2022 CLINICAL DATA:  [id number] Nausea and vomiting [id number] EXAM: PORTABLE ABDOMEN - 1 VIEW COMPARISON:  CT 08/19/2022 FINDINGS: Persistent dilated loops of colon. A few gas distended small bowel loops in the left mid abdomen. Calcified uterine fibroid. IMPRESSION: Persistent dilated colon suggesting distal obstruction. Electronically Signed   By: Corlis Leak M.D.   On: 08/21/2022 12:51   MR BRAIN W CONTRAST  Result Date: 08/20/2022 CLINICAL DATA:  Evaluate for metastatic disease. EXAM: MRI HEAD WITH CONTRAST TECHNIQUE: Multiplanar, multiecho pulse sequences of the brain and surrounding structures were obtained with intravenous contrast. CONTRAST:  7mL GADAVIST GADOBUTROL 1 MMOL/ML IV SOLN COMPARISON:  Noncontrast brain MRI from 2 days ago FINDINGS: Brain: Punctate enhancement in the right frontal parietal white matter on 5:38, associated with a focus of restricted diffusion. No generalized abnormal enhancement. Vascular: Major vessels are enhancing Skull and upper cervical spine: No focal marrow lesion. Sinuses/Orbits: Active bilateral sinusitis with maxillary fluid levels. Complete left frontal sinus opacification. Enhancing polypoid densities in the bilateral nasal cavity which are numerous. IMPRESSION: 1. Single punctate enhancement in the right frontal parietal white matter, a site of weakly restricted diffusion and compatible with subacute ischemia. If high concern for metastatic disease follow-up in 12 weeks could prove resolution. 2. Active sinusitis with nasal cavity polyps. Electronically Signed   By: Tiburcio Pea M.D.   On: 08/20/2022 12:01       LOS: 4 days   Adriannah Steinkamp Rito Ehrlich  [facility] Pager on [email or web address]  08/22/2022, 9:48 AM

## 2022-08-22 NOTE — Transfer of Care (Signed)
Immediate Anesthesia Transfer of Care Note  Patient: Debra Sharp  Procedure(s) Performed: EXPLORATORY LAPAROTOMY PARTIAL COLECTOMY COLOSTOMY  Patient Location: ICU  Anesthesia Type:General  Level of Consciousness: sedated and Patient remains intubated per anesthesia plan  Airway & Oxygen Therapy: Patient remains intubated per anesthesia plan and Patient placed on Ventilator (see vital sign flow sheet for setting)  Post-op Assessment: Report given to RN, Post -op Vital signs reviewed and stable, and Patient moving all extremities X 4  Post vital signs: Reviewed and stable  Last Vitals:  Vitals Value Taken Time  BP 124/43 08/22/22 2017  Temp    Pulse 83 08/22/22 2023  Resp 18 08/22/22 2023  SpO2 100 % 08/22/22 2023  Vitals shown include unvalidated device data.  Last Pain:  Vitals:   08/22/22 1605  TempSrc: Oral  PainSc:          Complications: No notable events documented.  Patient transported to ICU with standard monitors (HR, BP, SPO2, RR) and emergency drugs/equipment. Controlled ventilation maintained via ambu bag. Report given to bedside RN and respiratory therapist. Pt connected to ICU monitor and ventilator. All questions answered and vital signs stable before leaving

## 2022-08-22 NOTE — Progress Notes (Addendum)
Attending Physician's Attestation   I have taken an interval history, reviewed the chart and examined the patient.   Please see NP Gunnar Fusi Guenther's progress note for full details.  Her note is in an incomplete status at the time of the writing of this addendum/attestation but will be updated accordingly.  I have discussed her case with our neurology colleagues and although they would like to consider the role of antiplatelet therapy, including the potential use of Plavix, and understand our concern in the setting of her anemia and the findings suggestive of an abdominal mass causing likely obstruction of the colon.  Etiology of this being colon related versus GYN related still remains the case.  We are planning an endoscopic evaluation on 7/6.  Based on the timing of things, my partner Dr. Myrtie Neither will likely be performing her procedures including an upper endoscopy and colonoscopy attempt.  Preparation orders are in place for today to try to clean her out for her colonoscopy.  The risks and benefits of endoscopic evaluation were discussed with the patient; these include but are not limited to the risk of perforation, infection, bleeding, missed lesions, lack of diagnosis, severe illness requiring hospitalization, as well as anesthesia and sedation related illnesses.  The patient and/or family is agreeable to proceed as we have discussed previously and reiterated again today with her.  I agree with the Advanced Practitioner's note, impression, and recommendations with updates and my documentation as noted above.  The majority of the medical decision making/process, formulation of the impression/plan of action for the patient were performed by me with substantive portion of this encounter (>50% time spent including complete performance of at least one of the key components of MDM, History, and/or Exam).   Corliss Parish, MD [facility] Advanced Endoscopy Office # [phone]

## 2022-08-22 NOTE — Progress Notes (Signed)
Patient arrive into Pre-op with NG tube in place. This nurse was reviewing the chart when the patient said "Ew, Please take this" Patient was holding her NG tube in her hand. When asked why she removed it she stated "It was just something they were trying upstairs on me and it wasn't working. It was not something the doctor needed or anything they just wanted to trial it and it don't work it just hurts my throat." We explained the reasoning behind the NG that it was suctioning the fluid out of her stomach and the doctor had placed an order for it. Dr. Bedelia Person was notified the NG tube had been pulled out by the patient. No new orders received at this time.

## 2022-08-22 NOTE — Consult Note (Signed)
Reason for Consult/Chief Complaint: LBO Consultant: Rito Ehrlich, MD  Real David is an 72 y.o. female.   HPI: 51F with abdominal pain and IDA, workup notable for colon/pelvic mass. GI unable to perform colonoscopy due to patient inability to tolerate prep and repeat CT today significant for pneumoperitoneum.   Past Medical History:  Diagnosis Date   Anemia    Anxiety    Arthritis    Asthma    Blood transfusion without reported diagnosis    Cataract    removed bilateral   COPD (chronic obstructive pulmonary disease) (HCC)    Depression    GERD (gastroesophageal reflux disease)    Hyperlipidemia    Hypertension     Past Surgical History:  Procedure Laterality Date   APPENDECTOMY     cataracts Bilateral    KNEE SURGERY     NASAL SINUS SURGERY      Family History  Problem Relation Age of Onset   Colon cancer Neg Hx    Colon polyps Neg Hx    Crohn's disease Neg Hx    Esophageal cancer Neg Hx    Rectal cancer Neg Hx    Stomach cancer Neg Hx    Ulcerative colitis Neg Hx     Social History:  reports that she has quit smoking. Her smoking use included cigarettes. She has never used smokeless tobacco. She reports that she does not drink alcohol and does not use drugs.  Allergies:  Allergies  Allergen Reactions   Aspirin Other (See Comments)    Caused an asthma attack   Ibuprofen Swelling and Other (See Comments)    Angioedema and Asthma exacerbation   Sulfonamide Derivatives Swelling and Other (See Comments)    Face swelled SEVERELY   Penicillins Rash    Medications: I have reviewed the patient's current medications.  Results for orders placed or performed during the hospital encounter of 08/18/22 (from the past 48 hour(s))  Protime-INR     Status: None   Collection Time: 08/21/22  2:59 AM  Result Value Ref Range   Prothrombin Time 14.4 11.4 - 15.2 seconds   INR 1.1 0.8 - 1.2    Comment: (NOTE) INR goal varies based on device and disease states. Performed  at [facility], [address], [place], [place] [address]   CEA     Status: Abnormal   Collection Time: 08/21/22  2:59 AM  Result Value Ref Range   CEA 143.0 (H) 0.0 - 4.7 ng/mL    Comment: (NOTE)                             Nonsmokers          <3.9                             Smokers             <5.6 [employer] Electrochemiluminescence Immunoassay (ECLIA) Values obtained with different assay methods or kits cannot be used interchangeably.  Results cannot be interpreted as absolute evidence of the presence or absence of malignant disease. Performed At: [facility] [address] [place], [place] [id number] Jolene Schimke MD OZ:[id number]   CBC     Status: Abnormal   Collection Time: 08/21/22  8:07 AM  Result Value Ref Range   WBC 14.8 (H) 4.0 - 10.5 K/uL   RBC 3.90 3.87 - 5.11 MIL/uL  Hemoglobin 8.8 (L) 12.0 - 15.0 g/dL    Comment: Reticulocyte Hemoglobin testing may be clinically indicated, consider ordering this additional test [id number]    HCT 30.1 (L) 36.0 - 46.0 %   MCV 77.2 (L) 80.0 - 100.0 fL   MCH 22.6 (L) 26.0 - 34.0 pg   MCHC 29.2 (L) 30.0 - 36.0 g/dL   RDW 78.2 (H) 95.6 - 21.3 %   Platelets 935 (HH) 150 - 400 K/uL    Comment: REPEATED TO VERIFY THIS CRITICAL RESULT HAS VERIFIED AND BEEN CALLED TO J.MOORE RN BY KATHERINE [facility] ON 07 05 2024 AT 0901, AND HAS BEEN READ BACK.     nRBC 0.0 0.0 - 0.2 %    Comment: Performed at [facility], [address], [place], [place] [address]  Basic metabolic panel     Status: Abnormal   Collection Time: 08/21/22  8:07 AM  Result Value Ref Range   Sodium 141 135 - 145 mmol/L   Potassium 2.9 (L) 3.5 - 5.1 mmol/L   Chloride 110 98 - 111 mmol/L   CO2 17 (L) 22 - 32 mmol/L   Glucose, Bld 90 70 - 99 mg/dL    Comment: Glucose reference range applies only to samples taken after fasting for at least 8 hours.   BUN 16 8 - 23 mg/dL   Creatinine, Ser 8.46 0.44 - 1.00 mg/dL   Calcium 8.5 (L) 8.9 -  10.3 mg/dL   GFR, Estimated >96 >29 mL/min    Comment: (NOTE) Calculated using the CKD-EPI Creatinine Equation (2021)    Anion gap 14 5 - 15    Comment: Performed at [facility], [address], [place], [place] [address]  Magnesium     Status: None   Collection Time: 08/21/22  8:07 AM  Result Value Ref Range   Magnesium 2.0 1.7 - 2.4 mg/dL    Comment: Performed at [facility], [address] [place], [place] [address]  Potassium     Status: None   Collection Time: 08/21/22  2:40 PM  Result Value Ref Range   Potassium 3.7 3.5 - 5.1 mmol/L    Comment: Performed at [facility], [address], [place], [place] [address]  Basic metabolic panel     Status: Abnormal   Collection Time: 08/22/22  3:34 AM  Result Value Ref Range   Sodium 139 135 - 145 mmol/L   Potassium 3.7 3.5 - 5.1 mmol/L   Chloride 113 (H) 98 - 111 mmol/L   CO2 15 (L) 22 - 32 mmol/L   Glucose, Bld 83 70 - 99 mg/dL    Comment: Glucose reference range applies only to samples taken after fasting for at least 8 hours.   BUN 16 8 - 23 mg/dL   Creatinine, Ser 5.36 0.44 - 1.00 mg/dL   Calcium 8.3 (L) 8.9 - 10.3 mg/dL   GFR, Estimated >64 >40 mL/min    Comment: (NOTE) Calculated using the CKD-EPI Creatinine Equation (2021)    Anion gap 11 5 - 15    Comment: Performed at [facility], [address], [place], [place] [address]  CBC     Status: Abnormal   Collection Time: 08/22/22  3:34 AM  Result Value Ref Range   WBC 11.2 (H) 4.0 - 10.5 K/uL   RBC 3.66 (L) 3.87 - 5.11 MIL/uL   Hemoglobin 8.4 (L) 12.0 - 15.0 g/dL   HCT 59.5 (L) 63.8 - 75.6 %  MCV 78.7 (L) 80.0 - 100.0 fL   MCH 23.0 (L) 26.0 - 34.0 pg   MCHC 29.2 (L) 30.0 - 36.0 g/dL   RDW 47.8 (H) 29.5 - 62.1 %   Platelets 879 (H) 150 - 400 K/uL   nRBC 0.0 0.0 - 0.2 %    Comment: Performed at [facility], [address] [place], [place] [address]    CT ABDOMEN PELVIS W CONTRAST  Addendum Date: 08/22/2022   ADDENDUM REPORT:  08/22/2022 16:51 ADDENDUM: Critical Value/emergent results were called by telephone at the time of interpretation on 08/22/2022 at 4:39 pm to provider PAULA GUENTHER , who verbally acknowledged these results. Electronically Signed   By: Genevive Bi M.D.   On: 08/22/2022 16:51   Result Date: 08/22/2022 CLINICAL DATA:  Bowel obstruction. EXAM: CT ABDOMEN AND PELVIS WITH CONTRAST TECHNIQUE: Multidetector CT imaging of the abdomen and pelvis was performed using the standard protocol following bolus administration of intravenous contrast. RADIATION DOSE REDUCTION: This exam was performed according to the departmental dose-optimization program which includes automated exposure control, adjustment of the mA and/or kV according to patient size and/or use of iterative reconstruction technique. CONTRAST:  75mL OMNIPAQUE IOHEXOL 350 MG/ML SOLN COMPARISON:  CT 08/19/2022 com plain film 08/22/2018 FINDINGS: Lower chest: Small bilateral pleural effusions. Hepatobiliary: No focal hepatic lesion. Normal gallbladder. No biliary duct dilatation. Common bile duct is normal. Pancreas: Pancreas is normal. No ductal dilatation. No pancreatic inflammation. Spleen: Normal spleen Adrenals/urinary tract: Adrenal glands normal. Severe hydronephrosis of the RIGHT kidney. Severe hydroureter on the RIGHT. Findings not changed from comparison exam. Hydroureter extends in the pelvis. LEFT kidney normal. Stomach/Bowel: NG tube in stomach. This fluid within the distal esophagus. The stomach is nondistended. Proximal small bowel is normal caliber. Again demonstrated marked distension of the colon. There is new intraperitoneal free air noted anterior to the liver (image 12/3). Scattered free air in the falciform ligament (image 14/3). Small amount of free air noted in the ventral peritoneal space of the mid abdomen. Minimal free fluid in the abdomen pelvis. No clear source of bowel perforation of presumably from the colon. Vascular/Lymphatic:  Abdominal aorta is normal caliber. No periportal or retroperitoneal adenopathy. No pelvic adenopathy. Reproductive: Calcified enlarged uterine fibroid. Complex mass in the RIGHT adnexa measuring 8.5 by 6.7 cm. Other: No free fluid. Musculoskeletal: No aggressive osseous lesion. IMPRESSION: 1. New intraperitoneal free air. Presumed perforation of the colon. No clear site of perforation identified. Minimal intraperitoneal free fluid. 2. Fluid within the esophagus presumably related to bowel obstruction. NG tube in stomach. 3. Proximal small bowel is nondistended. 4. Colon is markedly distended with air-fluid levels. 5. Severe hydronephrosis of the RIGHT kidney and RIGHT hydroureter. Potential obstructing RIGHT adnexal mass. Electronically Signed: By: Genevive Bi M.D. On: 08/22/2022 16:29   DG Abd 1 View  Result Date: 08/22/2022 CLINICAL DATA:  Bowel obstruction. EXAM: ABDOMEN - 1 VIEW COMPARISON:  Radiograph yesterday, CT 08/19/2022 FINDINGS: Gaseous bowel distension, colonic distention on recent CT, without significant interval change. Multiple ingested pills in the right abdomen. Calcified uterine fibroid as before. CT is pending at this time. IMPRESSION: Gaseous bowel distension, colonic distention on recent CT, without significant interval change. Electronically Signed   By: Narda Rutherford M.D.   On: 08/22/2022 15:58   DG Abd 1 View  Result Date: 08/22/2022 CLINICAL DATA:  Nasogastric tube placement.  Bowel obstruction. EXAM: ABDOMEN - 1 VIEW COMPARISON:  Earlier today FINDINGS: Tip and side port of the enteric  tube below the diaphragm in the stomach. Gaseous bowel distention in the upper abdomen. IMPRESSION: Tip and side port of the enteric tube below the diaphragm in the stomach. Electronically Signed   By: Narda Rutherford M.D.   On: 08/22/2022 15:57   VAS [place] TRANSCRANIAL DOPPLER  Result Date: 08/21/2022  Transcranial Doppler Patient Name:  VIKTORYA LEIFHEIT  Date of Exam:   08/21/2022 Medical Rec #:  [id number]      Accession #:    [id number] Date of Birth: [birth year]      Patient Gender: F Patient Age:   88 years Exam Location:  [facility] Procedure:      VAS [place] TRANSCRANIAL DOPPLER Referring Phys: Osvaldo Shipper --------------------------------------------------------------------------------  Indications: Stroke. Comparison Study: No prior studies. Performing Technologist: Jean Rosenthal RDMS, RVT  Examination Guidelines: A complete evaluation includes B-mode imaging, spectral Doppler, color Doppler, and power Doppler as needed of all accessible portions of each vessel. Bilateral testing is considered an integral part of a complete examination. Limited examinations for reoccurring indications may be performed as noted.  +----------+---------------+----------+-----------+------------------+ RIGHT TCD Right VM (cm/s)Depth (cm)Pulsatility     Comment       +----------+---------------+----------+-----------+------------------+ MCA             79                    1.17                       +----------+---------------+----------+-----------+------------------+ ACA             -36                   1.13                       +----------+---------------+----------+-----------+------------------+ Term ICA        53                    1.11                       +----------+---------------+----------+-----------+------------------+ PCA P1          -15                   0.46                       +----------+---------------+----------+-----------+------------------+ Opthalmic       26                    1.81                       +----------+---------------+----------+-----------+------------------+ ICA siphon      63                    1.30                       +----------+---------------+----------+-----------+------------------+ Vertebral                                     Bidirectional flow +----------+---------------+----------+-----------+------------------+   +----------+--------------+----------+-----------+------------------+ LEFT TCD  Left VM (cm/s)Depth (cm)Pulsatility     Comment       +----------+--------------+----------+-----------+------------------+ MCA            101  1.21                       +----------+--------------+----------+-----------+------------------+ ACA                                          Unable to insonate +----------+--------------+----------+-----------+------------------+ Term ICA        26                   1.45                       +----------+--------------+----------+-----------+------------------+ PCA P1          33                   1.04                       +----------+--------------+----------+-----------+------------------+ Opthalmic       23                   1.92                       +----------+--------------+----------+-----------+------------------+ ICA siphon      45                   1.34                       +----------+--------------+----------+-----------+------------------+ Vertebral      -32                   1.29                       +----------+--------------+----------+-----------+------------------+  +------------+-------+-------+             VM cm/sComment +------------+-------+-------+ Prox Basilar  -43          +------------+-------+-------+ Dist Basilar  -11          +------------+-------+-------+ Summary:  Elevated left middle cerebral artery mean flow velocities suggest mild stenosis.Mildy elevated right middle cerebral artery mean flow velcoities of unclear significance *See table(s) above for TCD measurements and observations.  Diagnosing physician: Delia Heady MD Electronically signed by Delia Heady MD on 08/21/2022 at 8:01:56 PM.    Final    VAS US CAROTID  Result Date: 08/21/2022 Carotid Arterial Duplex Study Patient Name:  DAKYLA GODFREY  Date of Exam:   08/21/2022 Medical Rec #: [id number]      Accession #:    [id number]  Date of Birth: Jul 16, 1950      Patient Gender: F Patient Age:   78 years Exam Location:  [facility] Procedure:      VAS US CAROTID Referring Phys: Osvaldo Shipper --------------------------------------------------------------------------------  Indications:       CVA. Risk Factors:      Hypertension, hyperlipidemia. Comparison Study:  No prior studies. Performing Technologist: Jean Rosenthal RDMS, RVT  Examination Guidelines: A complete evaluation includes B-mode imaging, spectral Doppler, color Doppler, and power Doppler as needed of all accessible portions of each vessel. Bilateral testing is considered an integral part of a complete examination. Limited examinations for reoccurring indications may be performed as noted.  Right Carotid Findings: +----------+--------+--------+--------+------------------+---------+           PSV cm/sEDV cm/sStenosisPlaque DescriptionComments  +----------+--------+--------+--------+------------------+---------+  CCA Prox  185     18                                Turbulent +----------+--------+--------+--------+------------------+---------+ CCA Distal129     22                                          +----------+--------+--------+--------+------------------+---------+ ICA Prox  147     31      1-39%   calcific          tortuous  +----------+--------+--------+--------+------------------+---------+ ICA Distal118     27                                          +----------+--------+--------+--------+------------------+---------+ ECA       111     16                                          +----------+--------+--------+--------+------------------+---------+ +----------+--------+-------+--------+-------------------+           PSV cm/sEDV cmsDescribeArm Pressure (mmHG) +----------+--------+-------+--------+-------------------+ [id number]            Stenotic                     +----------+--------+-------+--------+-------------------+ +---------+--------+--+--------+---------------+ VertebralPSV cm/s21EDV cm/sBi- directional +---------+--------+--+--------+---------------+  Left Carotid Findings: +----------+--------+--------+--------+------------------+--------+           PSV cm/sEDV cm/sStenosisPlaque DescriptionComments +----------+--------+--------+--------+------------------+--------+ CCA Prox  67      16                                         +----------+--------+--------+--------+------------------+--------+ CCA Distal77      17                                         +----------+--------+--------+--------+------------------+--------+ ICA Prox  89      25      1-39%   calcific                   +----------+--------+--------+--------+------------------+--------+ ICA Distal95      31                                         +----------+--------+--------+--------+------------------+--------+ ECA       80                                                 +----------+--------+--------+--------+------------------+--------+ +----------+--------+--------+----------------+-------------------+           PSV cm/sEDV cm/sDescribe        Arm Pressure (mmHG) +----------+--------+--------+----------------+-------------------+ [id number]              Multiphasic, WNL                    +----------+--------+--------+----------------+-------------------+ +---------+--------+--+--------+--+---------+  VertebralPSV cm/s74EDV cm/s14Antegrade +---------+--------+--+--------+--+---------+   Summary: Right Carotid: Velocities in the right ICA are consistent with a 1-39% stenosis. Left Carotid: Velocities in the left ICA are consistent with a 1-39% stenosis. Vertebrals:  Left vertebral artery demonstrates antegrade flow. Right vertebral              artery demonstrates bidirectional flow. Subclavians: Right subclavian artery was stenotic.  Normal flow hemodynamics were              seen in the left subclavian artery. *See table(s) above for measurements and observations.  Electronically signed by Delia Heady MD on 08/21/2022 at 7:57:56 PM.    Final    DG Abd Portable 1V  Result Date: 08/21/2022 CLINICAL DATA:  [id number] Nausea and vomiting [id number] EXAM: PORTABLE ABDOMEN - 1 VIEW COMPARISON:  CT 08/19/2022 FINDINGS: Persistent dilated loops of colon. A few gas distended small bowel loops in the left mid abdomen. Calcified uterine fibroid. IMPRESSION: Persistent dilated colon suggesting distal obstruction. Electronically Signed   By: Corlis Leak M.D.   On: 08/21/2022 12:51    ROS 10 point review of systems is negative except as listed above in HPI.   Physical Exam Blood pressure (!) 139/56, pulse (!) 110, temperature 97.9 F (36.6 C), temperature source Oral, resp. rate 16, height 5\' 6"  (1.676 m), weight 77.1 kg, SpO2 98 %. Constitutional: well-developed, well-nourished HEENT: pupils equal, round, reactive to light, 2mm b/l, moist conjunctiva, external inspection of ears and nose normal, hearing intact Oropharynx: normal oropharyngeal mucosa, normal dentition Neck: no thyromegaly, trachea midline, no midline cervical tenderness to palpation Chest: breath sounds equal bilaterally, normal respiratory effort, no midline or lateral chest wall tenderness to palpation/deformity Abdomen: soft, distended, moderately tender, no bruising, no hepatosplenomegaly Extremities: 2+ radial and pedal pulses bilaterally, intact motor and sensation bilateral UE and LE, no peripheral edema MSK: unable to assess gait/station, no clubbing/cyanosis of fingers/toes, normal ROM of all four extremities Skin: warm, dry, no rashes Psych: normal memory, normal mood/affect     Assessment/Plan: 37F with pneumoperitoneum likely 2/2 obstructive pelvic mass of unclear etiology. Plan for emergent exlap, partial colectomy, colostomy, and attempted biopsy of the mass if  not included in the colectomy specimen. Informed consent obtained from the patient, all questions answered. When offered, patient declines for me to speak with her family.    Diamantina Monks, MD General and Trauma Surgery [facility]

## 2022-08-22 NOTE — Progress Notes (Signed)
Patient was inform that she would be receiving another bowel prep, once it arrives to the unit by the pharmacy. Patient was informed that she would also be NPO at midnight for an Endoscopy. Patient expressed that she was upset that she had a bowel prep the night before and was not sent down for her testing. Due to the time (2145) patient stated that there was no point of requesting it from the pharmacy because she was not going to do another bowel prep that will cause her to force herself to drink so much. I informed the patient that it will be considered as refusing since she has stated that she will not drink it. Patient have be informed of her risk of not taking the bowel prep.

## 2022-08-22 NOTE — Anesthesia Postprocedure Evaluation (Signed)
Anesthesia Post Note  Patient: Debra Sharp  Procedure(s) Performed: EXPLORATORY LAPAROTOMY PARTIAL COLECTOMY COLOSTOMY     Patient location during evaluation: ICU Anesthesia Type: General Level of consciousness: sedated Pain management: pain level controlled Vital Signs Assessment: post-procedure vital signs reviewed and stable Respiratory status: patient remains intubated per anesthesia plan Cardiovascular status: stable Postop Assessment: no apparent nausea or vomiting Anesthetic complications: no   No notable events documented.  Last Vitals:  Vitals:   08/22/22 2015 08/22/22 2032  BP: (!) 141/68   Pulse: 85   Resp: 18   Temp:  36.5 C  SpO2:      Last Pain:  Vitals:   08/22/22 2032  TempSrc: Oral  PainSc:                  Catheryn Bacon Thera Basden

## 2022-08-22 NOTE — Progress Notes (Signed)
An USGPIV (ultrasound guided PIV) has been placed for short-term vasopressor infusion. A correctly placed ivWatch must be used when administering Vasopressors. Should this treatment be needed beyond 72 hours, central line access should be obtained.  It will be the responsibility of the bedside nurse to follow best practice to prevent extravasations.   

## 2022-08-22 NOTE — H&P (Signed)
NAME:  Debra Sharp, MRN:  [id number], DOB:  1950-10-29, LOS: 4 ADMISSION DATE:  08/18/2022, CONSULTATION DATE:  08/22/22 REFERRING MD: surgery  , CHIEF COMPLAINT:  Vent management   History of Present Illness:  Debra Sharp is a 72 y.o. F with PMH significant for HTN, HL, HFrEF, thrombocytopenia, COPD, Asthma, thrombocytopenia who presented with weakness and abdominal pain and anemia concerning for UGIB and iron deficiency anemia.  CT abdomen/pelvis was done which showed colonic distension and possible obstruction. She was seen by GI and plan was for EGD, however she could not tolerate prep.  Other problems included R-sided hydronephrosis and incidental acute stroke seen on head CT/MRI.  Repeat CT abd/pelvis on 7/6 showed new intraperitoneal free air with presumed perforation of the colon.   Surgery was consulted and patient went to the OR for an emergent ex-lap, R hemicolectomy, ileostomy with fistula and biopsy of pelvic mass.  Pt was left intubated and PCCM consulted for vent management   Pertinent  Medical History   has a past medical history of Anemia, Anxiety, Arthritis, Asthma, Blood transfusion without reported diagnosis, Cataract, COPD (chronic obstructive pulmonary disease) (HCC), Depression, GERD (gastroesophageal reflux disease), Hyperlipidemia, and Hypertension.   Significant Hospital Events: Including procedures, antibiotic start and stop dates in addition to other pertinent events   7/2 admitted to the hospitalist service  7/6 To OR for ex-lap after concern for bowel perf, left intubated  Interim History / Subjective:  Pt arrived to the ICU on neosynephrine   Objective   Blood pressure (!) 139/56, pulse (!) 110, temperature 97.9 F (36.6 C), temperature source Oral, resp. rate 16, height 5' 5.98" (1.676 m), weight 77.1 kg, SpO2 98 %.        Intake/Output Summary (Last 24 hours) at 08/22/2022 1913 Last data filed at 08/22/2022 4098 Gross per 24 hour  Intake 2240 ml   Output --  Net 2240 ml   Filed Weights   08/18/22 0941 08/22/22 1718  Weight: 77.1 kg 77.1 kg   General:  critically ill F intubated and sedated  HEENT: MM pink/moist, ETT in place  Neuro: examined on propofol, RASS -2, pupils equal  CV: s1s2 tachycardic, regular no m/r/g PULM:  mechanically ventilated, no rhonchi or wheezing, synchronous with vent  GI: soft, bsx4 active  Extremities: warm/dry, 1+ pedal edema  Skin: no rashes or lesions   Resolved Hospital Problem list     Assessment & Plan:    Post-op ventilator management  Pt left intubated after ex-lap -PAD protocol with  --Maintain full vent support with SAT/SBT as tolerated -titrate Vent setting to maintain SpO2 greater than or equal to 90%. -HOB elevated 30 degrees. -Plateau pressures less than 30 cm H20.  -Follow chest x-ray, ABG prn.   -Bronchial hygiene and RT/bronchodilator protocol.    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 Peri-op Hypotension S/p ex-lap, R hemicolectomy and ileostomy, bx of pelvic mass -management per surgical team -feculent peritonitis noted, obtain blood cultures and continue flagyl and ceftriaxone  -hypotension intra-operatively requiring neo infusion, continue to maintain MAP >65  UTI, R hydronephrosis Likely secondary to obstructing mass  -urology was consulted, no intervention at this time -continue ceftriaxone     Acute on chronic iron deficiency anemia Hgb on presentation was 4.6, transfused three units on admission -hgb stable at 8.4, continue close post-op monitoring and transfuse for Hgb <7    HTN HFrEF HL -holding home meds in the setting of the above  Best Practice (right click and "Reselect all SmartList Selections" daily)   Diet/type: NPO DVT prophylaxis: SCD GI prophylaxis: PPI Lines: N/A Foley:  Yes, and it is still needed Code Status:  full code Last date of multidisciplinary goals of care discussion [pending]  Labs    CBC: Recent Labs  Lab 08/18/22 0946 08/18/22 2248 08/19/22 0208 08/19/22 1013 08/20/22 0321 08/21/22 0807 08/22/22 0334  WBC 14.4*  --  13.9*  --  14.1* 14.8* 11.2*  HGB 4.6*   < > 7.0* 8.7* 8.2* 8.8* 8.4*  HCT 17.7*   < > 23.6* 28.1* 26.9* 30.1* 28.8*  MCV 68.3*  --  73.3*  --  74.3* 77.2* 78.7*  PLT 1,255*  --  997*  --  888* 935* 879*   < > = values in this interval not displayed.    Basic Metabolic Panel: Recent Labs  Lab 08/18/22 0946 08/19/22 0208 08/20/22 0321 08/20/22 0722 08/21/22 0807 08/21/22 1440 08/22/22 0334  NA 137 136 137  --  141  --  139  K 2.8* 3.4* 2.9*  --  2.9* 3.7 3.7  CL 105 107 109  --  110  --  113*  CO2 21* 19* 18*  --  17*  --  15*  GLUCOSE 118* 106* 105*  --  90  --  83  BUN 14 14 15   --  16  --  16  CREATININE 0.84 0.83 0.76  --  0.77  --  0.80  CALCIUM 8.4* 8.2* 8.2*  --  8.5*  --  8.3*  MG 2.0  --   --  2.2 2.0  --   --    GFR: Estimated Creatinine Clearance: 68.6 mL/min (by C-G formula based on SCr of 0.8 mg/dL). Recent Labs  Lab 08/19/22 0208 08/20/22 0321 08/21/22 0807 08/22/22 0334  WBC 13.9* 14.1* 14.8* 11.2*    Liver Function Tests: Recent Labs  Lab 08/20/22 0321  AST 9*  ALT 9  ALKPHOS 75  BILITOT 0.6  PROT 6.0*  ALBUMIN 2.6*   No results for input(s): "LIPASE", "AMYLASE" in the last 168 hours. No results for input(s): "AMMONIA" in the last 168 hours.  ABG    Component Value Date/Time   TCO2 22 10/09/2009 2237     Coagulation Profile: Recent Labs  Lab 08/21/22 0259  INR 1.1    Cardiac Enzymes: No results for input(s): "CKTOTAL", "CKMB", "CKMBINDEX", "TROPONINI" in the last 168 hours.  HbA1C: Hgb A1c MFr Bld  Date/Time Value Ref Range Status  08/20/2022 03:21 AM 4.9 4.8 - 5.6 % Final    Comment:    (NOTE) Pre diabetes:          5.7%-6.4%  Diabetes:              >6.4%  Glycemic control for   <7.0% adults with diabetes     CBG: Recent Labs  Lab 08/18/22 1010  GLUCAP 106*     Review of Systems:   Unable to obtain  Past Medical History:  She,  has a past medical history of Anemia, Anxiety, Arthritis, Asthma, Blood transfusion without reported diagnosis, Cataract, COPD (chronic obstructive pulmonary disease) (HCC), Depression, GERD (gastroesophageal reflux disease), Hyperlipidemia, and Hypertension.   Surgical History:   Past Surgical History:  Procedure Laterality Date   APPENDECTOMY     cataracts Bilateral    KNEE SURGERY     NASAL SINUS SURGERY       Social History:   reports that she has quit  smoking. Her smoking use included cigarettes. She has never used smokeless tobacco. She reports that she does not drink alcohol and does not use drugs.   Family History:  Her family history is negative for Colon cancer, Colon polyps, Crohn's disease, Esophageal cancer, Rectal cancer, Stomach cancer, and Ulcerative colitis.   Allergies Allergies  Allergen Reactions   Aspirin Other (See Comments)    Caused an asthma attack   Ibuprofen Swelling and Other (See Comments)    Angioedema and Asthma exacerbation   Sulfonamide Derivatives Swelling and Other (See Comments)    Face swelled SEVERELY   Penicillins Rash     Home Medications  Prior to Admission medications   Medication Sig Start Date End Date Taking? Authorizing Provider  cetirizine (ZYRTEC) 10 MG tablet Take 1 tablet (10 mg total) by mouth daily. Patient taking differently: Take 10 mg by mouth daily as needed for allergies or rhinitis. 10/09/19  Yes Hawks, Christy A, FNP  ferrous sulfate 325 (65 FE) MG EC tablet Take 325 mg by mouth daily.   Yes [provider]  fluticasone (FLONASE) 50 MCG/ACT nasal spray Place 2 sprays into both nostrils daily. Patient taking differently: Place 2 sprays into both nostrils at bedtime as needed for allergies or rhinitis. 10/09/19  Yes Hawks, Christy A, FNP  lisinopril (ZESTRIL) 40 MG tablet Take 40 mg by mouth in the morning.   Yes [provider]   lovastatin (MEVACOR) 20 MG tablet Take 20 mg by mouth at bedtime.   Yes [provider]  PROAIR HFA 108 (90 Base) MCG/ACT inhaler Inhale 2 puffs into the lungs every 6 (six) hours as needed for wheezing or shortness of breath.   Yes [provider]  sertraline (ZOLOFT) 100 MG tablet Take 150 mg by mouth in the morning.   Yes [provider]  SYMBICORT 160-4.5 MCG/ACT inhaler Inhale 2 puffs into the lungs in the morning and at bedtime.   Yes [provider]  furosemide (LASIX) 20 MG tablet Take 1 tablet (20 mg total) by mouth daily. Patient not taking: Reported on 05/13/2022 04/26/22   Arrien, York Ram, MD  predniSONE (STERAPRED UNI-PAK 21 TAB) 10 MG (21) TBPK tablet 6 tabs for 1 day, then 5 tabs for 1 das, then 4 tabs for 1 day, then 3 tabs for 1 day, 2 tabs for 1 day, then 1 tab for 1 day Patient not taking: Reported on 04/24/2022 04/09/19   Janace Aris, NP     Critical care time: 19      CRITICAL CARE Performed by: Darcella Gasman Heidi Lemay   Total critical care time: 42 minutes  Critical care time was exclusive of separately billable procedures and treating other patients.  Critical care was necessary to treat or prevent imminent or life-threatening deterioration.  Critical care was time spent personally by me on the following activities: development of treatment plan with patient and/or surrogate as well as nursing, discussions with consultants, evaluation of patient's response to treatment, examination of patient, obtaining history from patient or surrogate, ordering and performing treatments and interventions, ordering and review of laboratory studies, ordering and review of radiographic studies, pulse oximetry and re-evaluation of patient's condition.   Darcella Gasman Judie Hollick, PA-C [facility] See Amion for pager If no response to pager , please call [phone] until 7pm After 7:00 pm call Elink  [phone]

## 2022-08-22 NOTE — Progress Notes (Addendum)
eLink Physician-Brief Progress Note Patient Name: Debra Sharp DOB: 03/06/1950 MRN: [id number]   Date of Service  08/22/2022  HPI/Events of Note  Patient now in ICU post OR. On vent ACVC Fio2 50, peep 5, RR 18. VT 470. Sbp in 130s on 35 mic of Phenylephrine via peripheral line. No distress. HR is 120.   eICU Interventions  CXR ABG  Ordered propofol (is currently running) and PRN fentanyl Ordered Phenylephrine (currently running) Ground CCM team notified and will be assessing D/w RN call us if needed     Intervention Category Major Interventions: Respiratory failure - evaluation and management;Sepsis - evaluation and management;Shock - evaluation and management  Ninamarie Keel G Kortland Nichols 08/22/2022, 9:02 PM  Addendum at 10:40 pm - Reviewed post op cbc and bmp. K is 5.3 and hyperchloremic acidosis. RN also called with rising Phenylephrine needs. Seen on camera. HR is 75. RN had given a fentany; push and needed to go up on propofol as well. Propofol is now being weaned. Will change the pressor to Levophed. Will also change fluids to LR. Give another LR bolus (completed 2 liter now which she came down with, from OR). Also added fentanyl drip to see if we can lower propofol. Despite these adjustments if she needs higher leviophed doses then will need to place a CVC. Have d/w RN.   Addendum at 12:45 am - Patient is on 9 mic/min of levophed and reducing dose of Phenylephrine at 110 mic. HR is 90s. Art line not working. Cuff reading with lower Maps mainly due to lower Diastolic Bps. Systolic BP is in 120s so will change the goal to keeping SBP > 90. Will replace the art line and check another abg and lactic acid to see if any evidence of ongoing hypoperfusion. Has been given 2.5 liter LR in addition to other fluids she got. Will give albumin x 1 as well If aftr placement of art line and labs , if unable to come down on peripheral pressors then will ask ground team for a CVC. Also restraints ordered as patient on  vent.   Addendum at 2:40 am - Despite above measures, patient hypotensive and now on 10 mic/min levophed as well as Phenylephrine . New art line showing SBP also in 80s so RN is going up on the pressors. CBC noted, stable H/H/ BMP and La are pending, ABG is better. I spoke with CCM PA Gleason and she will place a central line. Will also add on micafungin.   Addendum at 3:45 am - CVC is a little deep on CXR. No ectopy . In sinus . HR 108. D/w PA who did not have ectopy while placing as well. Will give ok to use. Will need to pull back if arrhythmia noted.   Addendum at 4:15 am - Retrospective entry. Had followed up on BMP and saw the K. Hyper K therapy with follow up at 7 am ordered. Now that patient has a CVC will try and go up on levo and come down on Neo.   Addendum at 6 am - Off Neo but now on 35 mic/min of levophed. Map is right at 65. CVP 9. O2 sat 99 while on 40% fio2. Made around 225 cc urine overnight. No other changes. Add vaso. Asked RN to start back the Neo in case more pressor needed since levophed requirements are quite high and now tachycardic to 130. No distress on propofol and fentanyl on camera. No change in belly exam.

## 2022-08-22 NOTE — Anesthesia Preprocedure Evaluation (Addendum)
Anesthesia Evaluation  Patient identified by MRN, date of birth, ID band Patient awake    Reviewed: Allergy & Precautions, NPO status , Patient's Chart, lab work & pertinent test results  Airway Mallampati: II  TM Distance: >3 FB Neck ROM: Full    Dental  (+) Missing   Pulmonary asthma , COPD,  COPD inhaler, former smoker   Pulmonary exam normal        Cardiovascular hypertension, Pt. on medications +CHF  Normal cardiovascular exam  ECHO: 1. Left ventricular ejection fraction, by estimation, is 40 to 45%. The  left ventricle has mildly decreased function. The left ventricle  demonstrates global hypokinesis.   2. Right ventricular systolic function is normal. The right ventricular  size is normal.   3. The mitral valve is normal in structure. Mild to moderate mitral valve  regurgitation.   4. The aortic valve is tricuspid. Aortic valve regurgitation is not  visualized.   5. Trivial pericardial effusion   6. The inferior vena cava is normal in size with greater than 50%  respiratory variability, suggesting right atrial pressure of 3 mmHg.     Neuro/Psych  PSYCHIATRIC DISORDERS Anxiety Depression    negative neurological ROS     GI/Hepatic negative GI ROS, Neg liver ROS,,,  Endo/Other  negative endocrine ROS    Renal/GU negative Renal ROS     Musculoskeletal  (+) Arthritis ,    Abdominal   Peds  Hematology  (+) Blood dyscrasia, anemia   Anesthesia Other Findings free air  Reproductive/Obstetrics                             Anesthesia Physical Anesthesia Plan  ASA: 3 and emergent  Anesthesia Plan: General   Post-op Pain Management:    Induction: Rapid sequence and Intravenous  PONV Risk Score and Plan: 3 and Ondansetron, Dexamethasone, Midazolam and Treatment may vary due to age or medical condition  Airway Management Planned: Oral ETT  Additional Equipment:   Intra-op  Plan:   Post-operative Plan: Possible Post-op intubation/ventilation  Informed Consent: I have reviewed the patients History and Physical, chart, labs and discussed the procedure including the risks, benefits and alternatives for the proposed anesthesia with the patient or authorized representative who has indicated his/her understanding and acceptance.     Dental advisory given  Plan Discussed with: CRNA  Anesthesia Plan Comments:         Anesthesia Quick Evaluation

## 2022-08-22 NOTE — Op Note (Addendum)
   Operative Note   Date: 08/22/2022  Procedure: exploratory laparotomy, R hemicolectomy, ileostomy creation, mucus fistula formation, biopsy of pelvic mass, intraoperative evaluation of vasculature using indocyanine green  Pre-op diagnosis: colon perforation secondary to obstructing pelvic mass Post-op diagnosis: same  Indication and clinical history: The patient is a 72 y.o. year old female with colon perforation secondary to obstructing pelvic mass.     Surgeon: Diamantina Monks, MD Assistant: Fredricka Bonine, MD  Anesthesiologist: Bradley Ferris, MD Anesthesia: General  Findings:  Specimen: R colectomy, pelvic mass EBL: 100cc Drains/Implants: 9F JP, LLQ, traversing the pelvis, pouch of Douglas, and R paracolic gutter  Disposition: ICU - intubated and critically ill.  Description of procedure: The patient was positioned supine on the operating room table. General anesthetic induction and intubation were uneventful. Foley catheter insertion was performed and was atraumatic. Time-out was performed verifying correct patient, procedure, signature of informed consent, and administration of pre-operative antibiotics. The abdomen was prepped and draped in the usual sterile fashion.  A lower midline incision was made and deepened through the fascia into the peritoneal cavity was entered.  Copious amounts of succus were immediately encountered.  The abdomen was suctioned and explored in its entirety.  A frank perforation of the ascending colon was noted.  The entire colon was distended down to the level of the sigmoid colon.  No palpable area of obstruction could be identified.  The white line of Toldt was taken down on the right to mobilize the colon.  The cecum and terminal ileum were densely adherent to the uterus.  The cecum and terminal ileum were mobilized using combination of blunt dissection and electrocautery.  During dissection, an enterotomy was made in the terminal ileum which was included in the  pathology specimen and significantly friable tissue was noted in the parametrium.  Samples of this friable tissue were sent to pathology as a pelvic mass specimen.  Attention was then returned back to the bowel and the terminal ileum was transected using a GIA stapler.  The colon was transected at the level of the transverse colon.  The mesentery was divided with a LigaSure device.  Indocyanine green was then administered intravenously and the bowel was inspected to confirm viability.  The abdomen was copiously irrigated until fluid returned clear.  A 19 [place] JP drain was placed in the left lower quadrant traversing the pelvis, pouch of Douglas, and right paracolic gutter.  Two sites were identified for the mucous fistula and the ileostomy.  The midline incision was closed with #1 looped PDS suture.  The ileostomy and mucous fistula were each matured.  An ostomy appliance was applied to each.  Sterile dressings were applied to the midline wound. All sponge and instrument counts were correct at the conclusion of the procedure. The patient was transported to the ICU in critical condition. There were no complications.   Upon entering the abdomen (organ space), I encountered feculent peritonitis.  CASE DATA:  Type of patient?: DOW CASE (Surgical Hospitalist [facility])  Status of Case? EMERGENT Add On  Infection Present At Time Of Surgery (PATOS)?  FECULENT PERITONITIS    Diamantina Monks, MD General and Trauma Surgery [facility] Surgery

## 2022-08-22 NOTE — Progress Notes (Signed)
Changes made following ABG. Will notify CCM.     08/22/22 2118  Vent Select  Invasive or Noninvasive Invasive  Adult Vent Y  Adult Ventilator Settings  Set Rate (S)  24 bmp (following ABG)  FiO2 (%) (S)  40 % (FOLLOWING ABG)  Adult Ventilator Measurements  SpO2 100 %

## 2022-08-22 NOTE — Anesthesia Procedure Notes (Signed)
Procedure Name: Intubation Date/Time: 08/22/2022 5:45 PM  Performed by: Colon Flattery, CRNAPre-anesthesia Checklist: Patient identified, Emergency Drugs available, Suction available and Patient being monitored Patient Re-evaluated:Patient Re-evaluated prior to induction Oxygen Delivery Method: Circle system utilized Preoxygenation: Pre-oxygenation with 100% oxygen Induction Type: IV induction, Rapid sequence and Cricoid Pressure applied Laryngoscope Size: Mac and 4 Grade View: Grade I Tube type: Oral Tube size: 7.0 mm Number of attempts: 1 Airway Equipment and Method: Stylet Placement Confirmation: ETT inserted through vocal cords under direct vision, positive ETCO2 and breath sounds checked- equal and bilateral Secured at: 21 cm Tube secured with: Tape Dental Injury: Teeth and Oropharynx as per pre-operative assessment

## 2022-08-23 ENCOUNTER — Encounter (HOSPITAL_COMMUNITY)
Admission: EM | Disposition: A | Discharge status: Nursing Home (Includes ICF) | Payer: Self-pay | Source: Home / Self Care | Attending: Family Medicine

## 2022-08-23 ENCOUNTER — Inpatient Hospital Stay (HOSPITAL_COMMUNITY): Payer: Medicare Other

## 2022-08-23 DIAGNOSIS — N1330 Unspecified hydronephrosis: Secondary | ICD-10-CM

## 2022-08-23 DIAGNOSIS — I9589 Other hypotension: Secondary | ICD-10-CM

## 2022-08-23 DIAGNOSIS — R194 Change in bowel habit: Secondary | ICD-10-CM

## 2022-08-23 DIAGNOSIS — A419 Sepsis, unspecified organism: Secondary | ICD-10-CM

## 2022-08-23 DIAGNOSIS — R933 Abnormal findings on diagnostic imaging of other parts of digestive tract: Secondary | ICD-10-CM

## 2022-08-23 DIAGNOSIS — D259 Leiomyoma of uterus, unspecified: Secondary | ICD-10-CM

## 2022-08-23 DIAGNOSIS — D509 Iron deficiency anemia, unspecified: Secondary | ICD-10-CM | POA: Diagnosis not present

## 2022-08-23 DIAGNOSIS — D696 Thrombocytopenia, unspecified: Secondary | ICD-10-CM | POA: Diagnosis not present

## 2022-08-23 DIAGNOSIS — K631 Perforation of intestine (nontraumatic): Secondary | ICD-10-CM | POA: Diagnosis not present

## 2022-08-23 DIAGNOSIS — I959 Hypotension, unspecified: Secondary | ICD-10-CM

## 2022-08-23 DIAGNOSIS — R1900 Intra-abdominal and pelvic swelling, mass and lump, unspecified site: Secondary | ICD-10-CM | POA: Diagnosis not present

## 2022-08-23 DIAGNOSIS — R6521 Severe sepsis with septic shock: Secondary | ICD-10-CM | POA: Diagnosis not present

## 2022-08-23 DIAGNOSIS — J9601 Acute respiratory failure with hypoxia: Secondary | ICD-10-CM | POA: Diagnosis not present

## 2022-08-23 DIAGNOSIS — R195 Other fecal abnormalities: Secondary | ICD-10-CM

## 2022-08-23 DIAGNOSIS — K8020 Calculus of gallbladder without cholecystitis without obstruction: Secondary | ICD-10-CM | POA: Diagnosis not present

## 2022-08-23 LAB — CBC
HCT: 30.2 % — ABNORMAL LOW (ref 36.0–46.0)
HCT: 30.1 % — ABNORMAL LOW (ref 36.0–46.0)
HCT: 27.6 % — ABNORMAL LOW (ref 36.0–46.0)
Hemoglobin: 8.5 g/dL — ABNORMAL LOW (ref 12.0–15.0)
Hemoglobin: 8.6 g/dL — ABNORMAL LOW (ref 12.0–15.0)
Hemoglobin: 7.9 g/dL — ABNORMAL LOW (ref 12.0–15.0)
MCH: 22.1 pg — ABNORMAL LOW (ref 26.0–34.0)
MCH: 23.1 pg — ABNORMAL LOW (ref 26.0–34.0)
MCH: 23.2 pg — ABNORMAL LOW (ref 26.0–34.0)
MCHC: 28.1 g/dL — ABNORMAL LOW (ref 30.0–36.0)
MCHC: 28.6 g/dL — ABNORMAL LOW (ref 30.0–36.0)
MCHC: 28.6 g/dL — ABNORMAL LOW (ref 30.0–36.0)
MCV: 78.6 fL — ABNORMAL LOW (ref 80.0–100.0)
MCV: 80.9 fL (ref 80.0–100.0)
MCV: 81.2 fL (ref 80.0–100.0)
Platelets: 637 10*3/uL — ABNORMAL HIGH (ref 150–400)
Platelets: 514 10*3/uL — ABNORMAL HIGH (ref 150–400)
Platelets: 414 10*3/uL — ABNORMAL HIGH (ref 150–400)
RBC: 3.84 MIL/uL — ABNORMAL LOW (ref 3.87–5.11)
RBC: 3.72 MIL/uL — ABNORMAL LOW (ref 3.87–5.11)
RBC: 3.4 MIL/uL — ABNORMAL LOW (ref 3.87–5.11)
RDW: 24.5 % — ABNORMAL HIGH (ref 11.5–15.5)
RDW: 25 % — ABNORMAL HIGH (ref 11.5–15.5)
RDW: 24.7 % — ABNORMAL HIGH (ref 11.5–15.5)
WBC: 25.9 10*3/uL — ABNORMAL HIGH (ref 4.0–10.5)
WBC: 27.3 10*3/uL — ABNORMAL HIGH (ref 4.0–10.5)
WBC: 34.5 10*3/uL — ABNORMAL HIGH (ref 4.0–10.5)
nRBC: 0 % (ref 0.0–0.2)
nRBC: 0.1 % (ref 0.0–0.2)
nRBC: 0.1 % (ref 0.0–0.2)

## 2022-08-23 LAB — COMPREHENSIVE METABOLIC PANEL WITH GFR
ALT: 7 U/L (ref 0–44)
AST: 14 U/L — ABNORMAL LOW (ref 15–41)
Albumin: 2.4 g/dL — ABNORMAL LOW (ref 3.5–5.0)
Alkaline Phosphatase: 35 U/L — ABNORMAL LOW (ref 38–126)
Anion gap: 9 (ref 5–15)
BUN: 17 mg/dL (ref 8–23)
CO2: 20 mmol/L — ABNORMAL LOW (ref 22–32)
Calcium: 8.4 mg/dL — ABNORMAL LOW (ref 8.9–10.3)
Chloride: 111 mmol/L (ref 98–111)
Creatinine, Ser: 0.98 mg/dL (ref 0.44–1.00)
GFR, Estimated: >60 mL/min
Glucose, Bld: 113 mg/dL — ABNORMAL HIGH (ref 70–99)
Potassium: 6.2 mmol/L — ABNORMAL HIGH (ref 3.5–5.1)
Sodium: 140 mmol/L (ref 135–145)
Total Bilirubin: 1.4 mg/dL — ABNORMAL HIGH (ref 0.3–1.2)
Total Protein: 4.6 g/dL — ABNORMAL LOW (ref 6.5–8.1)

## 2022-08-23 LAB — BASIC METABOLIC PANEL WITH GFR
Anion gap: 9 (ref 5–15)
BUN: 21 mg/dL (ref 8–23)
CO2: 18 mmol/L — ABNORMAL LOW (ref 22–32)
Calcium: 8.2 mg/dL — ABNORMAL LOW (ref 8.9–10.3)
Chloride: 110 mmol/L (ref 98–111)
Creatinine, Ser: 1.27 mg/dL — ABNORMAL HIGH (ref 0.44–1.00)
GFR, Estimated: 45 mL/min — ABNORMAL LOW
Glucose, Bld: 120 mg/dL — ABNORMAL HIGH (ref 70–99)
Potassium: 6.4 mmol/L (ref 3.5–5.1)
Sodium: 137 mmol/L (ref 135–145)

## 2022-08-23 LAB — BASIC METABOLIC PANEL
Anion gap: 9 (ref 5–15)
BUN: 15 mg/dL (ref 8–23)
CO2: 18 mmol/L — ABNORMAL LOW (ref 22–32)
Calcium: 7.9 mg/dL — ABNORMAL LOW (ref 8.9–10.3)
Chloride: 112 mmol/L — ABNORMAL HIGH (ref 98–111)
Creatinine, Ser: 0.94 mg/dL (ref 0.44–1.00)
GFR, Estimated: >60 mL/min (ref >60)
Glucose, Bld: 112 mg/dL — ABNORMAL HIGH (ref 70–99)
Potassium: 6 mmol/L — ABNORMAL HIGH (ref 3.5–5.1)
Sodium: 139 mmol/L (ref 135–145)

## 2022-08-23 LAB — CULTURE, BLOOD (ROUTINE X 2): Culture: NO GROWTH

## 2022-08-23 LAB — GLUCOSE, CAPILLARY
Glucose-Capillary: 82 mg/dL (ref 70–99)
Glucose-Capillary: 128 mg/dL — ABNORMAL HIGH (ref 70–99)
Glucose-Capillary: 99 mg/dL (ref 70–99)
Glucose-Capillary: 98 mg/dL (ref 70–99)
Glucose-Capillary: 97 mg/dL (ref 70–99)
Glucose-Capillary: 63 mg/dL — ABNORMAL LOW (ref 70–99)
Glucose-Capillary: 75 mg/dL (ref 70–99)
Glucose-Capillary: 124 mg/dL — ABNORMAL HIGH (ref 70–99)
Glucose-Capillary: 115 mg/dL — ABNORMAL HIGH (ref 70–99)

## 2022-08-23 LAB — LACTIC ACID, PLASMA
Lactic Acid, Venous: 1.2 mmol/L (ref 0.5–1.9)
Lactic Acid, Venous: 1.8 mmol/L (ref 0.5–1.9)

## 2022-08-23 LAB — POCT I-STAT 7, (LYTES, BLD GAS, ICA,H+H)
Acid-base deficit: 6 mmol/L — ABNORMAL HIGH (ref 0.0–2.0)
Bicarbonate: 19.7 mmol/L — ABNORMAL LOW (ref 20.0–28.0)
Calcium, Ion: 1.2 mmol/L (ref 1.15–1.40)
HCT: 29 % — ABNORMAL LOW (ref 36.0–46.0)
Hemoglobin: 9.9 g/dL — ABNORMAL LOW (ref 12.0–15.0)
O2 Saturation: 100 %
Patient temperature: 97.5
Potassium: 6.1 mmol/L — ABNORMAL HIGH (ref 3.5–5.1)
Sodium: 138 mmol/L (ref 135–145)
TCO2: 21 mmol/L — ABNORMAL LOW (ref 22–32)
pCO2 arterial: 37.1 mmHg (ref 32–48)
pH, Arterial: 7.329 — ABNORMAL LOW (ref 7.35–7.45)
pO2, Arterial: 180 mmHg — ABNORMAL HIGH (ref 83–108)

## 2022-08-23 LAB — TRIGLYCERIDES: Triglycerides: 50 mg/dL (ref <150)

## 2022-08-23 LAB — GLUCOSE, RANDOM: Glucose, Bld: 114 mg/dL — ABNORMAL HIGH (ref 70–99)

## 2022-08-23 LAB — POTASSIUM: Potassium: 6.4 mmol/L (ref 3.5–5.1)

## 2022-08-23 SURGERY — COLONOSCOPY WITH PROPOFOL
Anesthesia: Monitor Anesthesia Care

## 2022-08-23 MED ORDER — VASOPRESSIN 20 UNITS/100 ML INFUSION FOR SHOCK
0.0000 [IU]/min | INTRAVENOUS | Status: DC
  Administered 2022-08-23 (×3): 0.03 [IU]/min via INTRAVENOUS
  Filled 2022-08-23 (×3): qty 100

## 2022-08-23 MED ORDER — ALBUTEROL SULFATE (2.5 MG/3ML) 0.083% IN NEBU: 2.5000 mg | INHALATION_SOLUTION | RESPIRATORY_TRACT | Status: DC | PRN

## 2022-08-23 MED ORDER — ARFORMOTEROL TARTRATE 15 MCG/2ML IN NEBU
15.0000 ug | INHALATION_SOLUTION | Freq: Two times a day (BID) | RESPIRATORY_TRACT | Status: DC
  Administered 2022-08-23 – 2022-09-16 (×39): 15 ug via RESPIRATORY_TRACT
  Filled 2022-08-23 (×45): qty 2

## 2022-08-23 MED ORDER — BUDESONIDE 0.5 MG/2ML IN SUSP
0.5000 mg | Freq: Two times a day (BID) | RESPIRATORY_TRACT | Status: DC
  Administered 2022-08-23 – 2022-09-16 (×39): 0.5 mg via RESPIRATORY_TRACT
  Filled 2022-08-23 (×49): qty 2

## 2022-08-23 MED ORDER — REVEFENACIN 175 MCG/3ML IN SOLN
175.0000 ug | Freq: Every day | RESPIRATORY_TRACT | Status: DC
  Administered 2022-08-23 – 2022-09-16 (×23): 175 ug via RESPIRATORY_TRACT
  Filled 2022-08-23 (×25): qty 3

## 2022-08-23 MED ORDER — DEXTROSE 50 % IV SOLN
1.0000 | Freq: Once | INTRAVENOUS | Status: AC
  Administered 2022-08-23: 50 mL via INTRAVENOUS
  Filled 2022-08-23: qty 50

## 2022-08-23 MED ORDER — HYDROCORTISONE SOD SUC (PF) 100 MG IJ SOLR
100.0000 mg | Freq: Two times a day (BID) | INTRAMUSCULAR | Status: DC
  Administered 2022-08-23 – 2022-08-25 (×4): 100 mg via INTRAVENOUS
  Filled 2022-08-23 (×4): qty 2

## 2022-08-23 MED ORDER — DEXTROSE 50 % IV SOLN: 25.0000 mL | Freq: Once | INTRAVENOUS | Status: DC

## 2022-08-23 MED ORDER — CALCIUM GLUCONATE-NACL 1-0.675 GM/50ML-% IV SOLN
1.0000 g | Freq: Once | INTRAVENOUS | Status: AC
  Administered 2022-08-23: 1000 mg via INTRAVENOUS
  Filled 2022-08-23: qty 50

## 2022-08-23 MED ORDER — SODIUM CHLORIDE 0.9 % IV SOLN
100.0000 mg | Freq: Every day | INTRAVENOUS | Status: DC
  Administered 2022-08-23 – 2022-09-08 (×17): 100 mg via INTRAVENOUS
  Filled 2022-08-23 (×17): qty 5

## 2022-08-23 MED ORDER — DEXTROSE 50 % IV SOLN
INTRAVENOUS | Status: AC
  Administered 2022-08-23: 50 mL via INTRAVENOUS
  Filled 2022-08-23: qty 50

## 2022-08-23 MED ORDER — ALBUTEROL SULFATE (2.5 MG/3ML) 0.083% IN NEBU
10.0000 mg | INHALATION_SOLUTION | Freq: Once | RESPIRATORY_TRACT | Status: AC
  Administered 2022-08-23: 10 mg via RESPIRATORY_TRACT
  Filled 2022-08-23: qty 12

## 2022-08-23 MED ORDER — SODIUM CHLORIDE 0.9% FLUSH: 10.0000 mL | INTRAVENOUS | Status: DC | PRN

## 2022-08-23 MED ORDER — INSULIN ASPART 100 UNIT/ML IJ SOLN
0.0000 [IU] | INTRAMUSCULAR | Status: DC
  Administered 2022-08-24 – 2022-08-26 (×3): 1 [IU] via SUBCUTANEOUS
  Administered 2022-08-27 (×2): 2 [IU] via SUBCUTANEOUS
  Administered 2022-08-27 (×2): 1 [IU] via SUBCUTANEOUS
  Administered 2022-08-27: 2 [IU] via SUBCUTANEOUS
  Administered 2022-08-28 – 2022-08-31 (×7): 1 [IU] via SUBCUTANEOUS

## 2022-08-23 MED ORDER — DEXTROSE 50 % IV SOLN: 1.0000 | Freq: Once | INTRAVENOUS | Status: AC

## 2022-08-23 MED ORDER — SODIUM BICARBONATE 8.4 % IV SOLN
INTRAVENOUS | Status: DC
  Filled 2022-08-23 (×4): qty 1000

## 2022-08-23 MED ORDER — ALBUMIN HUMAN 25 % IV SOLN
12.5000 g | Freq: Once | INTRAVENOUS | Status: AC
  Administered 2022-08-23: 12.5 g via INTRAVENOUS
  Filled 2022-08-23: qty 50

## 2022-08-23 MED ORDER — INSULIN ASPART 100 UNIT/ML IV SOLN
10.0000 [IU] | Freq: Once | INTRAVENOUS | Status: AC
  Administered 2022-08-23: 10 [IU] via INTRAVENOUS

## 2022-08-23 MED ORDER — SODIUM CHLORIDE 0.9% FLUSH
10.0000 mL | Freq: Two times a day (BID) | INTRAVENOUS | Status: DC
  Administered 2022-08-23 – 2022-08-28 (×11): 10 mL

## 2022-08-23 MED ORDER — SODIUM CHLORIDE 0.9 % IV SOLN: INTRAVENOUS | Status: DC | PRN

## 2022-08-23 MED ORDER — SODIUM ZIRCONIUM CYCLOSILICATE 10 G PO PACK
10.0000 g | PACK | Freq: Once | ORAL | Status: AC
  Administered 2022-08-23: 10 g
  Filled 2022-08-23: qty 1

## 2022-08-23 MED ORDER — INSULIN ASPART 100 UNIT/ML IV SOLN
5.0000 [IU] | Freq: Once | INTRAVENOUS | Status: AC
  Administered 2022-08-23: 5 [IU] via INTRAVENOUS

## 2022-08-23 MED ORDER — SODIUM CHLORIDE 0.9 % IV SOLN
2.0000 g | Freq: Two times a day (BID) | INTRAVENOUS | Status: DC
  Administered 2022-08-23 – 2022-08-29 (×13): 2 g via INTRAVENOUS
  Filled 2022-08-23 (×14): qty 12.5

## 2022-08-23 MED ORDER — NOREPINEPHRINE 16 MG/250ML-% IV SOLN
0.0000 ug/min | INTRAVENOUS | Status: DC
  Administered 2022-08-23: 24 ug/min via INTRAVENOUS
  Administered 2022-08-23: 26 ug/min via INTRAVENOUS
  Administered 2022-08-23: 20 ug/min via INTRAVENOUS
  Administered 2022-08-24: 2 ug/min via INTRAVENOUS
  Filled 2022-08-23 (×4): qty 250

## 2022-08-23 NOTE — Consult Note (Signed)
GYNECOLOGIC ONCOLOGY INPATIENT CONSULTATION  Date of Service: 08/23/22 Requesting Service: General Surgery Requesting Provider: Dr. Phylliss Blakes Consulting Provider: Eugene Garnet, MD   HISTORY OF PRESENT ILLNESS: Debra Sharp is a 72 y.o. woman who is seen in consultation at the request of Dr. Fredricka Bonine for evaluation of pelvic mass, findings concerning for gynecologic malignancy.  The patient was initially admitted in March 2024 with worsening exertional dyspnea over several weeks after a URI. She endorsed a 15 pound weight loss over a year period. She was found to have symptomatic iron deficiency anemia (Hgb 5) with associated pulmonary congestion, small bilateral pleural effusions. She was also diagnosed with thrombocytosis (thought to be reactive in the setting of anemia). ECHO during that admission showed LVEF of 30-35%, severely dilated LV, large pericardial effusion. The patient felt better after pRBC transfusion and left the hospital AMA. The patient was scheduled for outpatient GI follow-up but unfortunately canceled her appointment for colonoscopy and EGD.  She then presented to the ED on 7/2 with generalized weakness, abdominal pain for the last 3-4 weeks, nausea and emesis for 2 days, and exertional dyspnea. She was tachycardic on presentation with severe anemia (Hgb 4.6) and thrombocytosis (1258). 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 MRI brain wo contrast showed findings suspicious for tiny acute ischemic infarcts, underlying mild-moderate chronic microvascular ischemic disease. Follow-up MRI with contrast showed single punctate enhancement in right frontal parietal white matter compatible with subacute ischemia. Follow-up recommended to prove resolution if concern for metastatic disease.   Abdominal ultrasound showed possible changes suggestive of early cirrhosis, distended gallbladder with questionable air within the gallbladder. Given  finding of moderate-severe right hydronephrosis, CT was obtained. CT A/P on 7/3 showed marked fluid and air distension of the colon with decompressed rectum, ill-defined mass within the right pelvis with central gas supicious for colon obstruction (possibly due to a sigmoid mass). There is severe right hydronephrosis and hydroureter with dilation to the level of pelvic hypodensity/mass. Imaging findings raise concern for colonic vs gyn malignancy. NO free air or free fluid ws seen. Uterus noted to be enlarged with large calcified uterine mass c/w fibroid.   Urology evaluated the patient given asymptomatic right hydroureteronephrosis with atrophic/cortical thinning of her right kidney, normal kidney function. Discussed conservative management, stent or PCN placement pending surgical plans. The patient declined stent or PCN placement.   Repeat ECHO on 7/3 showed LVEF of 40-45%, mildly decreased LV function with global hypokinesis. Trivial pericardial effusion noted.  EGD and colonoscopy were planned for 7/6. Unfortunately, the patient did not tolerate the prep and procedure was canceled. She developed progressive abdominal pain and discomfort. Given concern for possible perforation (pain, peritoneal signs), repeat imaging was obtained on 7/6 showed new intraperitoneal free air, no clear source of bowel perforation, complex 8.5 cm right adnexal mass. General surgery was consulted and recommend emergent surgery given concern for viscous perforation. The patient underwent exlap, right hemicolectomy, ileostomy, mucus fistula formation, biopsy of pelvic mass on 7/6 with Dr. Bedelia Person. Copious succus were noted on abdominal entry with frank perforation of the ascending colon identified. The cecum and terminal ileum were adherent to the uterus. Friable tissue was noted with the parametrium (biopsied).   The patient remained intubated at the end of surgery and was taken to the ICU.  She was continued of antibiotics  (cefepime, flagyl). Overnight, she required increasing pressor support. Lactic acid is normal (1.2-->1.8 this am).   The patient is intubated  and sedated this morning.  PAST MEDICAL HISTORY: Past Medical History:  Diagnosis Date   Anemia    Anxiety    Arthritis    Asthma    Blood transfusion without reported diagnosis    Cataract    removed bilateral   COPD (chronic obstructive pulmonary disease) (HCC)    Depression    GERD (gastroesophageal reflux disease)    Hyperlipidemia    Hypertension     PAST SURGICAL HISTORY: Past Surgical History:  Procedure Laterality Date   APPENDECTOMY     cataracts Bilateral    KNEE SURGERY     NASAL SINUS SURGERY      OB/GYN HISTORY: OB History  No obstetric history on file.    Age at menarche: Unable to obtain Age at menopause: Unable to obtain Hx of HRT: Unable to obtain Hx of STDs: Unable to obtain Last pap: Unable to obtain History of abnormal pap smears: Unable to obtain  MEDICATIONS:  Current Facility-Administered Medications:    0.9 %  sodium chloride infusion (Manually program via Guardrails IV Fluids), , Intravenous, Once, Gerhard Munch, MD   0.9 %  sodium chloride infusion (Manually program via Guardrails IV Fluids), , Intravenous, Once, Helseth, Olivia C, CRNA   0.9 %  sodium chloride infusion (Manually program via Guardrails IV Fluids), , Intravenous, Once, Lovick, Lennie Odor, MD   0.9 %  sodium chloride infusion, 250 mL, Intravenous, Continuous, Kamat, Sunil G, MD, Last Rate: 10 mL/hr at 08/23/22 0359, 250 mL at 08/23/22 0359   Place/Maintain arterial line, , , Until Discontinued **AND** 0.9 %  sodium chloride infusion, , Intra-arterial, PRN, Kamat, Sunil G, MD   acetaminophen (TYLENOL) tablet 650 mg, 650 mg, Oral, Q6H PRN **OR** acetaminophen (TYLENOL) suppository 650 mg, 650 mg, Rectal, Q6H PRN, Mikey College T, MD   albuterol (PROVENTIL) (2.5 MG/3ML) 0.083% nebulizer solution 2.5 mg, 2.5 mg, Inhalation, Q6H PRN, Emeline General, MD   bisacodyl (DULCOLAX) EC tablet 5 mg, 5 mg, Oral, Once, Mansouraty, Netty Starring., MD   ceFEPIme (MAXIPIME) 2 g in sodium chloride 0.9 % 100 mL IVPB, 2 g, Intravenous, Q8H, Titus Mould, RPH, Stopped at 08/23/22 0128   Chlorhexidine Gluconate Cloth 2 % PADS 6 each, 6 each, Topical, Q0600, Osvaldo Shipper, MD, 6 each at 08/22/22 2015   docusate (COLACE) 50 MG/5ML liquid 100 mg, 100 mg, Per Tube, BID, Kamat, Sunil G, MD   famotidine (PEPCID) tablet 20 mg, 20 mg, Per Tube, BID, Gleason, Darcella Gasman, PA-C   fentaNYL (SUBLIMAZE) bolus via infusion 25-100 mcg, 25-100 mcg, Intravenous, Q15 min PRN, Roxan Hockey, Sunil G, MD, 100 mcg at 08/23/22 0416   fentaNYL (SUBLIMAZE) injection 25 mcg, 25 mcg, Intravenous, Q15 min PRN, Kamat, Sunil G, MD   fentaNYL (SUBLIMAZE) injection 25-100 mcg, 25-100 mcg, Intravenous, Q30 min PRN, Roxan Hockey, Sunil G, MD, 50 mcg at 08/22/22 2133   fentaNYL in NS (1mcg/ml) infusion-PREMIX, 25-200 mcg/hr, Intravenous, Continuous, Kamat, Sunil G, MD, Last Rate: 15 mL/hr at 08/23/22 0400, 150 mcg/hr at 08/23/22 0400   hydrALAZINE (APRESOLINE) injection 5 mg, 5 mg, Intravenous, Q6H PRN, Mikey College T, MD   HYDROmorphone (DILAUDID) injection 0.5-1 mg, 0.5-1 mg, Intravenous, Q2H PRN, Mikey College T, MD, 1 mg at 08/22/22 1341   lactated ringers infusion, , Intravenous, Continuous, Kamat, Sunil G, MD, Last Rate: 75 mL/hr at 08/23/22 0400, Infusion Verify at 08/23/22 0400   LORazepam (ATIVAN) tablet 0.5 mg, 0.5 mg, Oral, Q6H PRN, Emeline General, MD  metroNIDAZOLE (FLAGYL) IVPB 500 mg, 500 mg, Intravenous, Q12H, Osvaldo Shipper, MD, Last Rate: 100 mL/hr at 08/23/22 0545, 500 mg at 08/23/22 0545   micafungin (MYCAMINE) 100 mg in sodium chloride 0.9 % 100 mL IVPB, 100 mg, Intravenous, Daily, Kamat, Clemetine Marker, MD, Last Rate: 105 mL/hr at 08/23/22 0401, 100 mg at 08/23/22 0401   norepinephrine (LEVOPHED) 16 mg in (0.064 mg/mL) premix infusion, 0-40 mcg/min, Intravenous, Titrated,  Luciano Cutter, MD, Last Rate: 23.4 mL/hr at 08/23/22 0400, 25 mcg/min at 08/23/22 0400   ondansetron (ZOFRAN) tablet 4 mg, 4 mg, Oral, Q6H PRN **OR** ondansetron (ZOFRAN) injection 4 mg, 4 mg, Intravenous, Q6H PRN, Mikey College T, MD, 4 mg at 08/22/22 1028   Oral care mouth rinse, 15 mL, Mouth Rinse, Q2H, Osvaldo Shipper, MD, 15 mL at 08/23/22 0544   Oral care mouth rinse, 15 mL, Mouth Rinse, PRN, Osvaldo Shipper, MD   pantoprazole (PROTONIX) injection 40 mg, 40 mg, Intravenous, Q12H, Mikey College T, MD, 40 mg at 08/22/22 2213   peg 3350 powder (MOVIPREP) kit 100 g, 0.5 kit, Oral, Once, Meredith Pel, NP   phenylephrine (NEO-SYNEPHRINE) 20mg /NS premix infusion, 25-200 mcg/min, Intravenous, Titrated, Kamat, Sunil G, MD, Last Rate: 60 mL/hr at 08/23/22 0303, 80 mcg/min at 08/23/22 0303   polyethylene glycol (MIRALAX / GLYCOLAX) packet 17 g, 17 g, Per Tube, Daily, Kamat, Sunil G, MD   propofol (DIPRIVAN) 1000 MG/100ML infusion, 0-50 mcg/kg/min, Intravenous, Continuous, Kamat, Sunil G, MD, Last Rate: 4.43 mL/hr at 08/23/22 0400, 10 mcg/kg/min at 08/23/22 0400   sodium chloride flush (NS) 0.9 % injection 10-40 mL, 10-40 mL, Intracatheter, Q12H, Luciano Cutter, MD   sodium chloride flush (NS) 0.9 % injection 10-40 mL, 10-40 mL, Intracatheter, PRN, Luciano Cutter, MD   sodium zirconium cyclosilicate (LOKELMA) packet 10 g, 10 g, Per Tube, Once, Harris, Whitney D, NP   vasopressin (PITRESSIN) 20 Units in sodium chloride 0.9 % 100 mL infusion-*FOR SHOCK*, 0-0.03 Units/min, Intravenous, Continuous, Kamat, Sunil G, MD, Last Rate: 9 mL/hr at 08/23/22 0604, 0.03 Units/min at 08/23/22 0604  ALLERGIES: Allergies  Allergen Reactions   Aspirin Other (See Comments)    Caused an asthma attack   Ibuprofen Swelling and Other (See Comments)    Angioedema and Asthma exacerbation   Sulfonamide Derivatives Swelling and Other (See Comments)    Face swelled SEVERELY   Penicillins Rash    FAMILY  HISTORY: Family History  Problem Relation Age of Onset   Colon cancer Neg Hx    Colon polyps Neg Hx    Crohn's disease Neg Hx    Esophageal cancer Neg Hx    Rectal cancer Neg Hx    Stomach cancer Neg Hx    Ulcerative colitis Neg Hx     SOCIAL HISTORY: Social History   Socioeconomic History   Marital status: Divorced    Spouse name: Not on file   Number of children: Not on file   Years of education: Not on file   Highest education level: Not on file  Occupational History   Not on file  Tobacco Use   Smoking status: Former    Types: Cigarettes   Smokeless tobacco: Never  Vaping Use   Vaping Use: Never used  Substance and Sexual Activity   Alcohol use: No   Drug use: No   Sexual activity: Not on file  Other Topics Concern   Not on file  Social History Narrative   Not on file   Social  Determinants of Health   Financial Resource Strain: Not on file  Food Insecurity: No Food Insecurity (08/19/2022)   Hunger Vital Sign    Worried About Running Out of Food in the Last Year: Never true    Ran Out of Food in the Last Year: Never true  Transportation Needs: No Transportation Needs (08/19/2022)   PRAPARE - Administrator, Civil Service (Medical): No    Lack of Transportation (Non-Medical): No  Physical Activity: Not on file  Stress: Not on file  Social Connections: Not on file  Intimate Partner Violence: Not At Risk (08/19/2022)   Humiliation, Afraid, Rape, and Kick questionnaire    Fear of Current or Ex-Partner: No    Emotionally Abused: No    Physically Abused: No    Sexually Abused: No    REVIEW OF SYSTEMS: Unable to obtain  PHYSICAL EXAM: BP (!) 113/46   Pulse (!) 122   Temp 98.1 F (36.7 C) (Axillary)   Resp (!) 24   Ht 5' 5.98" (1.676 m)   Wt 162 lb 11.2 oz (73.8 kg)   SpO2 100%   BMI 26.27 kg/m  General: Sedated. HEENT: Normocephalic, atraumatic.   Chest: Intubated. Cardiovascular: tachycardiac (HR 110s). Abdomen: Soft, nondistended.  No  masses or hepatosplenomegaly appreciated.  Midline incision with clean dressing in place, underlying open incision with healthy appearing tissue.  Both the patient's ileostomy and mucous fistula are healthy in appearance.  Ileostomy bag just changed but output reported.  There is somewhat turbid feculent colored high output drainage in the left lower quadrant drain. Extremities: Grossly normal range of motion.  Warm, well perfused.  1+ edema bilaterally of feet. GU: Deferred. Concentrated appearing urine in the foley bag.  LABORATORY AND RADIOLOGIC DATA:    Latest Ref Rng & Units 08/23/2022    2:03 AM 08/23/2022    1:53 AM 08/22/2022    9:16 PM  CBC  WBC 4.0 - 10.5 K/uL 25.9     Hemoglobin 12.0 - 15.0 g/dL 8.5  9.9  9.5   Hematocrit 36.0 - 46.0 % 30.2  29.0  28.0   Platelets 150 - 400 K/uL 637         Latest Ref Rng & Units 08/23/2022    6:24 AM 08/23/2022    2:03 AM 08/23/2022    1:53 AM  BMP  Glucose 70 - 99 mg/dL 409  811    BUN 8 - 23 mg/dL  15    Creatinine 9.14 - 1.00 mg/dL  7.82    Sodium 956 - 213 mmol/L  139  138   Potassium 3.5 - 5.1 mmol/L 6.4  6.0  6.1   Chloride 98 - 111 mmol/L  112    CO2 22 - 32 mmol/L  18    Calcium 8.9 - 10.3 mg/dL  7.9     Component Ref Range & Units 06:24 02:03 1 d ago  Lactic Acid, Venous 0.5 - 1.9 mmol/L 1.8 1.2 CM 1.0 CM   Blood culture 7/6: NO growth at <12 hours  CEA on 7/5: 143  FOBT on 7/2: positive  ASSESSMENT AND PLAN: Debra Sharp is a 72 y.o. woman with acute on chronic iron deficiency anemia, CHF, and several weeks of abdominal pain found to have a pelvic mass obstructing right urinary tract with suspected colon involvement primary colon process who developed acute perforation of her ascending colon requiring exploratory laparotomy with right hemicolectomy, ileostomy and mucous fistula creation, and biopsy of pelvic masses on  7/6.  Septic shock in the setting of bowel perforation: The patient has required 2 pressors overnight,  currently on fentanyl and propofol for sedation. Status is guarded currently. Patient is on broad spectrum antibiotics (anti-fungal coverage added) given stool spillage/peritonitis.   Suspected malignancy: Intra-operative findings most consistent with malignant process. Imaging reviewed with enlarged uterus and calcified fibroid, poorly defined right adnexal mass that raises concern for possible gynecologic malignancy. Interestingly, CEA was elevated (143), which would suggest a colonic process. CA-125 ordered this am. I would expect this to be at least mildly elevated in the setting of infection/inflammation. Will follow for pathology results of biopsies taken intra-op of pelvic mass.   Once pathology is back and she is able to participate in conversation, will need to have goals of care discussion to assess interest in cancer directed therapy if malignancy identified.   Hydroureteronephrosis: secondary to pelvic mass. Seen by urology already, declined any intervention (stent or PCN). Given normal kidney function, this seems reasonable at present.   Plan: Obtain CA-125 (ordered) F/u surgical pathology Medical oncology consult once pathology back Goals of care discussion with the patient once able to participate in discussion  Appreciate multi-disciplinary care of this complex patient  The above assessment and plan was communicated to general surgery.  Eugene Garnet MD Gynecologic Oncology

## 2022-08-23 NOTE — Procedures (Signed)
Central Venous Catheter Insertion Procedure Note  Debra Sharp  [id number]  [birth year]  Date:08/23/22  Time:3:21 AM   Provider Performing:Debra Sharp   Procedure: Insertion of Non-tunneled Central Venous [phone]) with US guidance ([id number])   Indication(s) Medication administration  Consent Risks of the procedure as well as the alternatives and risks of each were explained to the patient and/or caregiver.  Consent for the procedure was obtained and is signed in the bedside chart  Anesthesia Topical only with 1% lidocaine   Timeout Verified patient identification, verified procedure, site/side was marked, verified correct patient position, special equipment/implants available, medications/allergies/relevant history reviewed, required imaging and test results available.  Sterile Technique Maximal sterile technique including full sterile barrier drape, hand hygiene, sterile gown, sterile gloves, mask, hair covering, sterile ultrasound probe cover (if used).  Procedure Description Area of catheter insertion was cleaned with chlorhexidine and draped in sterile fashion.  With real-time ultrasound guidance a central venous catheter was placed into the left internal jugular vein. Nonpulsatile blood flow and easy flushing noted in all ports.  The catheter was sutured in place and sterile dressing applied.  Complications/Tolerance None; patient tolerated the procedure well. Chest X-ray is ordered to verify placement for internal jugular or subclavian cannulation.   Chest x-ray is not ordered for femoral cannulation.  EBL Minimal  Specimen(s) None  Debra Sharp Debra Gienger, PA-C

## 2022-08-23 NOTE — Progress Notes (Signed)
Pharmacy Antibiotic Note  Debra Sharp is a 72 y.o. female admitted on 08/18/2022 with septic shock/colonic perforation secondary to obstructive pelvis mass, UTI. Pharmacy has been consulted for Cefepime dosing. Pt also on Flagyl and Micafungin. SCr up a bit to 0.98.  Plan: Adjust cefepime to 2g IV q12h Flagyl 500mg  IV q12h + Micafungin 100mg  IV q24h per MD Monitor clinical progress, c/s, renal function F/u de-escalation plan/LOT  Height: 5' 5.98" (167.6 cm) Weight: 73.8 kg (162 lb 11.2 oz) IBW/kg (Calculated) : 59.26  Temp (24hrs), Avg:98 F (36.7 C), Min:97.5 F (36.4 C), Max:98.5 F (36.9 C)  Recent Labs  Lab 08/21/22 0807 08/22/22 0334 08/22/22 2052 08/23/22 0203 08/23/22 0624 08/23/22 0922  WBC 14.8* 11.2* 25.1* 25.9*  --  27.3*  CREATININE 0.77 0.80 0.90 0.94  --  0.98  LATICACIDVEN  --   --  1.0 1.2 1.8  --      Estimated Creatinine Clearance: 54.9 mL/min (by C-G formula based on SCr of 0.98 mg/dL).    Allergies  Allergen Reactions   Aspirin Other (See Comments)    Caused an asthma attack   Ibuprofen Swelling and Other (See Comments)    Angioedema and Asthma exacerbation   Sulfonamide Derivatives Swelling and Other (See Comments)    Face swelled SEVERELY   Penicillins Rash   Leia Alf, PharmD, BCPS Please check AMION for all [facility] contact numbers Clinical Pharmacist 08/23/2022 1:57 PM

## 2022-08-23 NOTE — Progress Notes (Signed)
eLink Physician-Brief Progress Note Patient Name: Debra Sharp DOB: 1950-06-30 MRN: [id number]   Date of Service  08/23/2022  HPI/Events of Note  72 year old female admitted with acute on chronic iron deficiency with blood loss.  Evidence of a colonic mass and colonic obstruction that eventually developed a colonic perforation due to said mass.  Underwent exploratory laparotomy today with hemicolectomy.  She has persistent metabolic acidosis although her ABG this morning was improved.  Called for persistent hyperkalemia of 6.2.  Thompson Caul is currently contraindicated in the setting of her colonic resection.    eICU Interventions  Discontinue her LR infusion, switch it with a bicarb infusion at 75 cc an hour.  Continue with hyperkalemia protocol otherwise-calcium, insulin, dextrose, albuterol.   0110 -renew restraints 0532 - k5.8, Continue Bicarb, repeat insulin, dextrose.   Intervention Category Intermediate Interventions: Electrolyte abnormality - evaluation and management  Bailie Christenbury 08/23/2022, 7:43 PM

## 2022-08-23 NOTE — Procedures (Signed)
Arterial Catheter Insertion Procedure Note  Emanda Soo  [id number]  1950-12-19  Date:08/23/22  Time:5:41 PM    Provider Performing: Alphonzo Lemmings D. Harris    Procedure: Insertion of Arterial Line (40981) with US guidance ([id number])   Indication(s) Blood pressure monitoring and/or need for frequent ABGs  Consent Risks of the procedure as well as the alternatives and risks of each were explained to the patient and/or caregiver.  Consent for the procedure was obtained and is signed in the bedside chart  Anesthesia None   Time Out Verified patient identification, verified procedure, site/side was marked, verified correct patient position, special equipment/implants available, medications/allergies/relevant history reviewed, required imaging and test results available.   Sterile Technique Maximal sterile technique including full sterile barrier drape, hand hygiene, sterile gown, sterile gloves, mask, hair covering, sterile ultrasound probe cover (if used).   Procedure Description Area of catheter insertion was cleaned with chlorhexidine and draped in sterile fashion. With real-time ultrasound guidance an arterial catheter was placed into the right femoral artery.  Appropriate arterial tracings confirmed on monitor.     Complications/Tolerance None; patient tolerated the procedure well.   EBL Minimal   Specimen(s) None  Sakia Schrimpf D. Harris, NP-C [facility] Personal contact information can be found on Amion  If no contact or response made please call [phone] 08/23/2022, 5:41 PM

## 2022-08-23 NOTE — Progress Notes (Addendum)
NAME:  Debra Sharp, MRN:  [id number], DOB:  27-May-1950, LOS: 5 ADMISSION DATE:  08/18/2022, CONSULTATION DATE:  08/23/22 REFERRING MD: surgery  , CHIEF COMPLAINT:  Vent management   History of Present Illness:  Debra Sharp is a 72 y.o. F with PMH significant for HTN, HL, HFrEF, thrombocytopenia, COPD, Asthma, thrombocytopenia who presented with weakness and abdominal pain and anemia concerning for UGIB and iron deficiency anemia.  CT abdomen/pelvis was done which showed colonic distension and possible obstruction. She was seen by GI and plan was for EGD, however she could not tolerate prep.  Other problems included R-sided hydronephrosis and incidental acute stroke seen on head CT/MRI.  Repeat CT abd/pelvis on 7/6 showed new intraperitoneal free air with presumed perforation of the colon.   Surgery was consulted and patient went to the OR for an emergent ex-lap, R hemicolectomy, ileostomy with fistula and biopsy of pelvic mass.  Pt was left intubated and PCCM consulted for vent management   Pertinent  Medical History   has a past medical history of Anemia, Anxiety, Arthritis, Asthma, Blood transfusion without reported diagnosis, Cataract, COPD (chronic obstructive pulmonary disease) (HCC), Depression, GERD (gastroesophageal reflux disease), Hyperlipidemia, and Hypertension.  Significant Hospital Events: Including procedures, antibiotic start and stop dates in addition to other pertinent events   7/2 admitted to the hospitalist service  7/6 To OR for ex-lap after concern for bowel perf, left intubated  Interim History / Subjective:  Sedated on vent  Increased pressor requirements this morning currently requiring 25 of levo and vaso   Objective   Blood pressure (!) 113/46, pulse (!) 122, temperature 98.5 F (36.9 C), temperature source Oral, resp. rate 19, height 5' 5.98" (1.676 m), weight 73.8 kg, SpO2 100 %.    Vent Mode: PRVC FiO2 (%):  [40 %-50 %] 40 % Set Rate:  [18 bmp-24 bmp] 24  bmp Vt Set:  [470 mL] 470 mL PEEP:  [5 cmH20] 5 cmH20 Plateau Pressure:  [14 cmH20-18 cmH20] 18 cmH20   Intake/Output Summary (Last 24 hours) at 08/23/2022 0700 Last data filed at 08/23/2022 0400 Gross per 24 hour  Intake 3841.15 ml  Output 1720 ml  Net 2121.15 ml    Filed Weights   08/18/22 0941 08/22/22 1718 08/22/22 2032  Weight: 77.1 kg 77.1 kg 73.8 kg    General: Acute on chronic ill-appearing elderly female lying in bed on mechanical ventilation in no acute distress HEENT: ETT, MM pink/moist, PERRL,  Neuro: Sedated on ventilator, flicker of movement to pain CV: s1s2 regular rate and rhythm, no murmur, rubs, or gallops,  PULM: Clear to auscultation bilaterally, no increased work of breathing, no added breath sounds GI: soft, bowel sounds hypoactive in all 4 quadrants, non-tender, non-distended, 2 ostomy sites right upper quadrant stool in bag left empty but dressing misplaced tolerating TF Extremities: warm/dry, no edema  Skin: no rashes or lesions  Resolved Hospital Problem list     Assessment & Plan:   Post-op ventilator management  -Pt left intubated after ex-lap P: Continue ventilator support with lung protective strategies  Wean PEEP and FiO2 for sats greater than 90%. Head of bed elevated 30 degrees. Plateau pressures less than 30 cm H20.  Follow intermittent chest x-ray and ABG.   SAT/SBT as tolerated, mentation preclude extubation  Ensure adequate pulmonary hygiene  Follow cultures  VAP bundle in place  PAD protocol Antibiotics and antifungals as below Follow cultures  Shock in the setting of colonic perforation due to obstructing pelvic and colonic  mass  -Worsening hemodynamics early a.m. 7/7 resulting in up titration of vasopressors, lactic acid within normal limit. Likely septic will rule out hemorrhagic as well  -feculent peritonitis noted s/p ex-lap, R hemicolectomy and ileostomy, bx of pelvic mass P: Vent support as above Antibiotics broadened a.m.  of 7/7 due to worsening shock state, antifungals added General surgery following Aggressive IV hydration provided early a.m. 7/7 Continue pressors for MAP< 65  Monitor urine output Follow cultures  UTI, R hydronephrosis -Likely secondary to obstructing mass  -Urology was consulted, options for possible internal stent discussed and patient declined at time At risk for acute kidney injury given shock state Hyperkalemia P: Antibiotics as above Monitor urine output Follow renal function  Monitor urine output Trend Bmet Avoid nephrotoxins Ensure adequate renal perfusion  IV hydration Lokelma, repeat CMP   Acute on chronic iron deficiency anemia -Hgb on presentation was 4.6, transfused three units on admission P: Trend CBC Transfuse per protocol Hemoglobin goal greater than 7  HTN HFrEF -Echo 08/19/2022 EF of 40 to 45% with global hypokinesis, normal RV function HLD P: Home medications given acute illness with shock Use telemetry Daily weight Strict intake and output Optimize electrolytes   Best Practice (right click and "Reselect all SmartList Selections" daily)   Diet/type: NPO DVT prophylaxis: SCD GI prophylaxis: PPI Lines: N/A Foley:  Yes, and it is still needed Code Status:  full code Last date of multidisciplinary goals of care discussion [pending]  Labs   CBC: Recent Labs  Lab 08/20/22 0321 08/21/22 0807 08/22/22 0334 08/22/22 1854 08/22/22 2052 08/22/22 2116 08/23/22 0153 08/23/22 0203  WBC 14.1* 14.8* 11.2*  --  25.1*  --   --  25.9*  HGB 8.2* 8.8* 8.4* 8.5* 8.4* 9.5* 9.9* 8.5*  HCT 26.9* 30.1* 28.8* 25.0* 29.3* 28.0* 29.0* 30.2*  MCV 74.3* 77.2* 78.7*  --  80.7  --   --  78.6*  PLT 888* 935* 879*  --  949*  --   --  637*     Basic Metabolic Panel: Recent Labs  Lab 08/18/22 0946 08/19/22 0208 08/20/22 0321 08/20/22 0722 08/21/22 0807 08/21/22 1440 08/22/22 0334 08/22/22 1854 08/22/22 2052 08/22/22 2116 08/23/22 0153 08/23/22 0203   NA 137   < > 137  --  141  --  139 138 141 140 138 139  K 2.8*   < > 2.9*  --  2.9*   < > 3.7 5.6* 5.3* 5.7* 6.1* 6.0*  CL 105   < > 109  --  110  --  113*  --  112*  --   --  112*  CO2 21*   < > 18*  --  17*  --  15*  --  17*  --   --  18*  GLUCOSE 118*   < > 105*  --  90  --  83  --  93  --   --  112*  BUN 14   < > 15  --  16  --  16  --  14  --   --  15  CREATININE 0.84   < > 0.76  --  0.77  --  0.80  --  0.90  --   --  0.94  CALCIUM 8.4*   < > 8.2*  --  8.5*  --  8.3*  --  8.0*  --   --  7.9*  MG 2.0  --   --  2.2 2.0  --   --   --   --   --   --   --    < > =  values in this interval not displayed.    GFR: Estimated Creatinine Clearance: 57.2 mL/min (by C-G formula based on SCr of 0.94 mg/dL). Recent Labs  Lab 08/21/22 0807 08/22/22 0334 08/22/22 2052 08/23/22 0203  WBC 14.8* 11.2* 25.1* 25.9*  LATICACIDVEN  --   --  1.0 1.2     Liver Function Tests: Recent Labs  Lab 08/20/22 0321 08/22/22 2052  AST 9* 9*  ALT 9 8  ALKPHOS 75 44  BILITOT 0.6 1.9*  PROT 6.0* 4.6*  ALBUMIN 2.6* 2.7*    No results for input(s): "LIPASE", "AMYLASE" in the last 168 hours. No results for input(s): "AMMONIA" in the last 168 hours.  ABG    Component Value Date/Time   PHART 7.329 (L) 08/23/2022 0153   PCO2ART 37.1 08/23/2022 0153   PO2ART 180 (H) 08/23/2022 0153   HCO3 19.7 (L) 08/23/2022 0153   TCO2 21 (L) 08/23/2022 0153   ACIDBASEDEF 6.0 (H) 08/23/2022 0153   O2SAT 100 08/23/2022 0153     Coagulation Profile: Recent Labs  Lab 08/21/22 0259  INR 1.1     Cardiac Enzymes: No results for input(s): "CKTOTAL", "CKMB", "CKMBINDEX", "TROPONINI" in the last 168 hours.  HbA1C: Hgb A1c MFr Bld  Date/Time Value Ref Range Status  08/20/2022 03:21 AM 4.9 4.8 - 5.6 % Final    Comment:    (NOTE) Pre diabetes:          5.7%-6.4%  Diabetes:              >6.4%  Glycemic control for   <7.0% adults with diabetes     CBG: Recent Labs  Lab 08/18/22 1010 08/22/22 2018  08/22/22 2308 08/23/22 0324 08/23/22 0445  GLUCAP 106* 77 89 82 128*     Review of Systems:   Unable to obtain  Past Medical History:  She,  has a past medical history of Anemia, Anxiety, Arthritis, Asthma, Blood transfusion without reported diagnosis, Cataract, COPD (chronic obstructive pulmonary disease) (HCC), Depression, GERD (gastroesophageal reflux disease), Hyperlipidemia, and Hypertension.   Surgical History:   Past Surgical History:  Procedure Laterality Date   APPENDECTOMY     cataracts Bilateral    KNEE SURGERY     NASAL SINUS SURGERY       Social History:   reports that she has quit smoking. Her smoking use included cigarettes. She has never used smokeless tobacco. She reports that she does not drink alcohol and does not use drugs.   Family History:  Her family history is negative for Colon cancer, Colon polyps, Crohn's disease, Esophageal cancer, Rectal cancer, Stomach cancer, and Ulcerative colitis.   Allergies Allergies  Allergen Reactions   Aspirin Other (See Comments)    Caused an asthma attack   Ibuprofen Swelling and Other (See Comments)    Angioedema and Asthma exacerbation   Sulfonamide Derivatives Swelling and Other (See Comments)    Face swelled SEVERELY   Penicillins Rash     Home Medications  Prior to Admission medications   Medication Sig Start Date End Date Taking? Authorizing Provider  cetirizine (ZYRTEC) 10 MG tablet Take 1 tablet (10 mg total) by mouth daily. Patient taking differently: Take 10 mg by mouth daily as needed for allergies or rhinitis. 10/09/19  Yes Hawks, Christy A, FNP  ferrous sulfate 325 (65 FE) MG EC tablet Take 325 mg by mouth daily.   Yes [provider]  fluticasone (FLONASE) 50 MCG/ACT nasal spray Place 2 sprays into both nostrils daily. Patient taking differently: Place  2 sprays into both nostrils at bedtime as needed for allergies or rhinitis. 10/09/19  Yes Hawks, Christy A, FNP  lisinopril (ZESTRIL) 40 MG  tablet Take 40 mg by mouth in the morning.   Yes [provider]  lovastatin (MEVACOR) 20 MG tablet Take 20 mg by mouth at bedtime.   Yes [provider]  PROAIR HFA 108 (90 Base) MCG/ACT inhaler Inhale 2 puffs into the lungs every 6 (six) hours as needed for wheezing or shortness of breath.   Yes [provider]  sertraline (ZOLOFT) 100 MG tablet Take 150 mg by mouth in the morning.   Yes [provider]  SYMBICORT 160-4.5 MCG/ACT inhaler Inhale 2 puffs into the lungs in the morning and at bedtime.   Yes [provider]  furosemide (LASIX) 20 MG tablet Take 1 tablet (20 mg total) by mouth daily. Patient not taking: Reported on 05/13/2022 04/26/22   Arrien, York Ram, MD  predniSONE (STERAPRED UNI-PAK 21 TAB) 10 MG (21) TBPK tablet 6 tabs for 1 day, then 5 tabs for 1 das, then 4 tabs for 1 day, then 3 tabs for 1 day, 2 tabs for 1 day, then 1 tab for 1 day Patient not taking: Reported on 04/24/2022 04/09/19   Janace Aris, NP     Critical care time: 15      CRITICAL CARE Performed by: Mellony Danziger D. Harris   Total critical care time: 42 minutes  Critical care time was exclusive of separately billable procedures and treating other patients.  Critical care was necessary to treat or prevent imminent or life-threatening deterioration.  Critical care was time spent personally by me on the following activities: development of treatment plan with patient and/or surrogate as well as nursing, discussions with consultants, evaluation of patient's response to treatment, examination of patient, obtaining history from patient or surrogate, ordering and performing treatments and interventions, ordering and review of laboratory studies, ordering and review of radiographic studies, pulse oximetry and re-evaluation of patient's condition.   Darcella Gasman Gleason, PA-C [facility] See Amion for pager If no response to pager , please call [phone]  [phone] until 7pm After 7:00 pm call Elink  [phone]

## 2022-08-23 NOTE — Progress Notes (Signed)
Progress Note  1 Day Post-Op  Subjective: Sedated on the vent. 2 pressors. 500 cc out from ileostomy, nothing from mucus fistula. JP with high volume output  Objective: Vital signs in last 24 hours: Temp:  [97.5 F (36.4 C)-98.5 F (36.9 C)] 98.1 F (36.7 C) (07/07 0742) Pulse Rate:  [73-122] 122 (07/07 0415) Resp:  [13-25] 24 (07/07 0746) BP: (83-141)/(25-68) 113/46 (07/07 0415) SpO2:  [89 %-100 %] 100 % (07/07 0415) Arterial Line BP: (68-150)/(39-93) 93/53 (07/07 0415) FiO2 (%):  [40 %-50 %] 40 % (07/07 0813) Weight:  [73.8 kg-77.1 kg] 73.8 kg (07/06 2032) Last BM Date : 08/22/22  Intake/Output from previous day: 07/06 0701 - 07/07 0700 In: 3841.2 [I.V.:2824.5; IV Piggyback:1016.7] Out: 1795 [Urine:280; Drains:765; Stool:650; Blood:100] Intake/Output this shift: No intake/output data recorded.  PE: General: WD, ill appearing female who is sedated on the vent Heart: sinus tachycardia.  Palpable pedal pulses bilaterally Lungs: vented Abd: soft, ileostomy viable, mucus fistula viable, JP with feculent appearing fluid - high volume, midline wound clean GU: foley present     Lab Results:  Recent Labs    08/22/22 2052 08/22/22 2116 08/23/22 0153 08/23/22 0203  WBC 25.1*  --   --  25.9*  HGB 8.4*   < > 9.9* 8.5*  HCT 29.3*   < > 29.0* 30.2*  PLT 949*  --   --  637*   < > = values in this interval not displayed.   BMET Recent Labs    08/22/22 2052 08/22/22 2116 08/23/22 0153 08/23/22 0203 08/23/22 0624  NA 141   < > 138 139  --   K 5.3*   < > 6.1* 6.0* 6.4*  CL 112*  --   --  112*  --   CO2 17*  --   --  18*  --   GLUCOSE 93  --   --  112* 114*  BUN 14  --   --  15  --   CREATININE 0.90  --   --  0.94  --   CALCIUM 8.0*  --   --  7.9*  --    < > = values in this interval not displayed.   PT/INR Recent Labs    08/21/22 0259  LABPROT 14.4  INR 1.1   CMP     Component Value Date/Time   NA 139 08/23/2022 0203   K 6.4 (HH) 08/23/2022 0624   CL  112 (H) 08/23/2022 0203   CO2 18 (L) 08/23/2022 0203   GLUCOSE 114 (H) 08/23/2022 0624   BUN 15 08/23/2022 0203   CREATININE 0.94 08/23/2022 0203   CALCIUM 7.9 (L) 08/23/2022 0203   PROT 4.6 (L) 08/22/2022 2052   ALBUMIN 2.7 (L) 08/22/2022 2052   AST 9 (L) 08/22/2022 2052   ALT 8 08/22/2022 2052   ALKPHOS 44 08/22/2022 2052   BILITOT 1.9 (H) 08/22/2022 2052   GFRNONAA >60 08/23/2022 0203   GFRAA >60 05/11/2015 0231   Lipase  No results found for: "LIPASE"     Studies/Results: DG CHEST PORT 1 VIEW  Result Date: 08/23/2022 CLINICAL DATA:  Central line care. EXAM: PORTABLE CHEST 1 VIEW COMPARISON:  08/22/2022. FINDINGS: The heart is enlarged and the mediastinal contour stable. There is a small left pleural effusion with atelectasis at the left lung base. No pneumothorax. The distal tip of the left internal jugular central venous catheter terminates over the right atrium. An enteric tube courses over the stomach and out of the  field of view. An endotracheal tube terminates 5.3 cm above the carina. IMPRESSION: 1. Small left pleural effusion with atelectasis at the left lung base. 2. Left internal jugular central venous catheter terminates over the right atrium. 3. Remaining support apparatus as described above. Electronically Signed   By: Thornell Sartorius M.D.   On: 08/23/2022 03:54   DG Chest Port 1 View  Result Date: 08/22/2022 CLINICAL DATA:  Endotracheal tube. EXAM: PORTABLE CHEST 1 VIEW COMPARISON:  04/24/2022. FINDINGS: The heart size and mediastinal contours are within normal limits. Strandy atelectasis or infiltrate is present at the left lung base. There is a small left pleural effusion. The right lung is clear. No pneumothorax bilaterally. An enteric tube courses over the upper abdomen and out of the field of view. The endotracheal tube terminates 4.8 cm above the carina. IMPRESSION: 1. Small left pleural effusion with atelectasis or infiltrate at the left lung base. 2. Support apparatus  as described above. Electronically Signed   By: Thornell Sartorius M.D.   On: 08/22/2022 21:28   CT ABDOMEN PELVIS W CONTRAST  Addendum Date: 08/22/2022   ADDENDUM REPORT: 08/22/2022 16:51 ADDENDUM: Critical Value/emergent results were called by telephone at the time of interpretation on 08/22/2022 at 4:39 pm to provider PAULA GUENTHER , who verbally acknowledged these results. Electronically Signed   By: Genevive Bi M.D.   On: 08/22/2022 16:51   Result Date: 08/22/2022 CLINICAL DATA:  Bowel obstruction. EXAM: CT ABDOMEN AND PELVIS WITH CONTRAST TECHNIQUE: Multidetector CT imaging of the abdomen and pelvis was performed using the standard protocol following bolus administration of intravenous contrast. RADIATION DOSE REDUCTION: This exam was performed according to the departmental dose-optimization program which includes automated exposure control, adjustment of the mA and/or kV according to patient size and/or use of iterative reconstruction technique. CONTRAST:  75mL OMNIPAQUE IOHEXOL 350 MG/ML SOLN COMPARISON:  CT 08/19/2022 com plain film 08/22/2018 FINDINGS: Lower chest: Small bilateral pleural effusions. Hepatobiliary: No focal hepatic lesion. Normal gallbladder. No biliary duct dilatation. Common bile duct is normal. Pancreas: Pancreas is normal. No ductal dilatation. No pancreatic inflammation. Spleen: Normal spleen Adrenals/urinary tract: Adrenal glands normal. Severe hydronephrosis of the RIGHT kidney. Severe hydroureter on the RIGHT. Findings not changed from comparison exam. Hydroureter extends in the pelvis. LEFT kidney normal. Stomach/Bowel: NG tube in stomach. This fluid within the distal esophagus. The stomach is nondistended. Proximal small bowel is normal caliber. Again demonstrated marked distension of the colon. There is new intraperitoneal free air noted anterior to the liver (image 12/3). Scattered free air in the falciform ligament (image 14/3). Small amount of free air noted in the ventral  peritoneal space of the mid abdomen. Minimal free fluid in the abdomen pelvis. No clear source of bowel perforation of presumably from the colon. Vascular/Lymphatic: Abdominal aorta is normal caliber. No periportal or retroperitoneal adenopathy. No pelvic adenopathy. Reproductive: Calcified enlarged uterine fibroid. Complex mass in the RIGHT adnexa measuring 8.5 by 6.7 cm. Other: No free fluid. Musculoskeletal: No aggressive osseous lesion. IMPRESSION: 1. New intraperitoneal free air. Presumed perforation of the colon. No clear site of perforation identified. Minimal intraperitoneal free fluid. 2. Fluid within the esophagus presumably related to bowel obstruction. NG tube in stomach. 3. Proximal small bowel is nondistended. 4. Colon is markedly distended with air-fluid levels. 5. Severe hydronephrosis of the RIGHT kidney and RIGHT hydroureter. Potential obstructing RIGHT adnexal mass. Electronically Signed: By: Genevive Bi M.D. On: 08/22/2022 16:29   DG Abd 1 View  Result Date: 08/22/2022 CLINICAL  DATA:  Bowel obstruction. EXAM: ABDOMEN - 1 VIEW COMPARISON:  Radiograph yesterday, CT 08/19/2022 FINDINGS: Gaseous bowel distension, colonic distention on recent CT, without significant interval change. Multiple ingested pills in the right abdomen. Calcified uterine fibroid as before. CT is pending at this time. IMPRESSION: Gaseous bowel distension, colonic distention on recent CT, without significant interval change. Electronically Signed   By: Narda Rutherford M.D.   On: 08/22/2022 15:58   DG Abd 1 View  Result Date: 08/22/2022 CLINICAL DATA:  Nasogastric tube placement.  Bowel obstruction. EXAM: ABDOMEN - 1 VIEW COMPARISON:  Earlier today FINDINGS: Tip and side port of the enteric tube below the diaphragm in the stomach. Gaseous bowel distention in the upper abdomen. IMPRESSION: Tip and side port of the enteric tube below the diaphragm in the stomach. Electronically Signed   By: Narda Rutherford M.D.   On:  08/22/2022 15:57   VAS [place] TRANSCRANIAL DOPPLER  Result Date: 08/21/2022  Transcranial Doppler Patient Name:  Debra Sharp  Date of Exam:   08/21/2022 Medical Rec #: [id number]      Accession #:    [id number] Date of Birth: December 25, 1950      Patient Gender: F Patient Age:   37 years Exam Location:  [facility] Procedure:      VAS [place] TRANSCRANIAL DOPPLER Referring Phys: Osvaldo Shipper --------------------------------------------------------------------------------  Indications: Stroke. Comparison Study: No prior studies. Performing Technologist: Jean Rosenthal RDMS, RVT  Examination Guidelines: A complete evaluation includes B-mode imaging, spectral Doppler, color Doppler, and power Doppler as needed of all accessible portions of each vessel. Bilateral testing is considered an integral part of a complete examination. Limited examinations for reoccurring indications may be performed as noted.  +----------+---------------+----------+-----------+------------------+ RIGHT TCD Right VM (cm/s)Depth (cm)Pulsatility     Comment       +----------+---------------+----------+-----------+------------------+ MCA             79                    1.17                       +----------+---------------+----------+-----------+------------------+ ACA             -36                   1.13                       +----------+---------------+----------+-----------+------------------+ Term ICA        53                    1.11                       +----------+---------------+----------+-----------+------------------+ PCA P1          -15                   0.46                       +----------+---------------+----------+-----------+------------------+ Opthalmic       26                    1.81                       +----------+---------------+----------+-----------+------------------+ ICA siphon      63  1.30                        +----------+---------------+----------+-----------+------------------+ Vertebral                                     Bidirectional flow +----------+---------------+----------+-----------+------------------+  +----------+--------------+----------+-----------+------------------+ LEFT TCD  Left VM (cm/s)Depth (cm)Pulsatility     Comment       +----------+--------------+----------+-----------+------------------+ MCA            101                   1.21                       +----------+--------------+----------+-----------+------------------+ ACA                                          Unable to insonate +----------+--------------+----------+-----------+------------------+ Term ICA        26                   1.45                       +----------+--------------+----------+-----------+------------------+ PCA P1          33                   1.04                       +----------+--------------+----------+-----------+------------------+ Opthalmic       23                   1.92                       +----------+--------------+----------+-----------+------------------+ ICA siphon      45                   1.34                       +----------+--------------+----------+-----------+------------------+ Vertebral      -32                   1.29                       +----------+--------------+----------+-----------+------------------+  +------------+-------+-------+             VM cm/sComment +------------+-------+-------+ Prox Basilar  -43          +------------+-------+-------+ Dist Basilar  -11          +------------+-------+-------+ Summary:  Elevated left middle cerebral artery mean flow velocities suggest mild stenosis.Mildy elevated right middle cerebral artery mean flow velcoities of unclear significance *See table(s) above for TCD measurements and observations.  Diagnosing physician: Delia Heady MD Electronically signed by Delia Heady MD on  08/21/2022 at 8:01:56 PM.    Final    VAS US CAROTID  Result Date: 08/21/2022 Carotid Arterial Duplex Study Patient Name:  Debra Sharp  Date of Exam:   08/21/2022 Medical Rec #: [id number]      Accession #:    [id number] Date of Birth: April 27, 1950      Patient Gender: F Patient Age:   65 years  Exam Location:  [facility] Procedure:      VAS US CAROTID Referring Phys: Osvaldo Shipper --------------------------------------------------------------------------------  Indications:       CVA. Risk Factors:      Hypertension, hyperlipidemia. Comparison Study:  No prior studies. Performing Technologist: Jean Rosenthal RDMS, RVT  Examination Guidelines: A complete evaluation includes B-mode imaging, spectral Doppler, color Doppler, and power Doppler as needed of all accessible portions of each vessel. Bilateral testing is considered an integral part of a complete examination. Limited examinations for reoccurring indications may be performed as noted.  Right Carotid Findings: +----------+--------+--------+--------+------------------+---------+           PSV cm/sEDV cm/sStenosisPlaque DescriptionComments  +----------+--------+--------+--------+------------------+---------+ CCA Prox  185     18                                Turbulent +----------+--------+--------+--------+------------------+---------+ CCA Distal129     22                                          +----------+--------+--------+--------+------------------+---------+ ICA Prox  147     31      1-39%   calcific          tortuous  +----------+--------+--------+--------+------------------+---------+ ICA Distal118     27                                          +----------+--------+--------+--------+------------------+---------+ ECA       111     16                                          +----------+--------+--------+--------+------------------+---------+ +----------+--------+-------+--------+-------------------+            PSV cm/sEDV cmsDescribeArm Pressure (mmHG) +----------+--------+-------+--------+-------------------+ [id number]            Stenotic                    +----------+--------+-------+--------+-------------------+ +---------+--------+--+--------+---------------+ VertebralPSV cm/s21EDV cm/sBi- directional +---------+--------+--+--------+---------------+  Left Carotid Findings: +----------+--------+--------+--------+------------------+--------+           PSV cm/sEDV cm/sStenosisPlaque DescriptionComments +----------+--------+--------+--------+------------------+--------+ CCA Prox  67      16                                         +----------+--------+--------+--------+------------------+--------+ CCA Distal77      17                                         +----------+--------+--------+--------+------------------+--------+ ICA Prox  89      25      1-39%   calcific                   +----------+--------+--------+--------+------------------+--------+ ICA Distal95      31                                         +----------+--------+--------+--------+------------------+--------+  ECA       80                                                 +----------+--------+--------+--------+------------------+--------+ +----------+--------+--------+----------------+-------------------+           PSV cm/sEDV cm/sDescribe        Arm Pressure (mmHG) +----------+--------+--------+----------------+-------------------+ [id number]              Multiphasic, WNL                    +----------+--------+--------+----------------+-------------------+ +---------+--------+--+--------+--+---------+ VertebralPSV cm/s74EDV cm/s14Antegrade +---------+--------+--+--------+--+---------+   Summary: Right Carotid: Velocities in the right ICA are consistent with a 1-39% stenosis. Left Carotid: Velocities in the left ICA are consistent with a 1-39% stenosis. Vertebrals:  Left  vertebral artery demonstrates antegrade flow. Right vertebral              artery demonstrates bidirectional flow. Subclavians: Right subclavian artery was stenotic. Normal flow hemodynamics were              seen in the left subclavian artery. *See table(s) above for measurements and observations.  Electronically signed by Delia Heady MD on 08/21/2022 at 7:57:56 PM.    Final     Anti-infectives: Anti-infectives (From admission, onward)    Start     Dose/Rate Route Frequency Ordered Stop   08/23/22 0400  micafungin (MYCAMINE) 100 mg in sodium chloride 0.9 % 100 mL IVPB        100 mg 105 mL/hr over 1 Hours Intravenous Daily 08/23/22 0242     08/22/22 1800  metroNIDAZOLE (FLAGYL) IVPB 500 mg        500 mg 100 mL/hr over 60 Minutes Intravenous Every 12 hours 08/22/22 1711     08/22/22 1800  ceFEPIme (MAXIPIME) 2 g in sodium chloride 0.9 % 100 mL IVPB        2 g 200 mL/hr over 30 Minutes Intravenous Every 8 hours 08/22/22 1716     08/22/22 1724  metroNIDAZOLE (FLAGYL) 500 MG/100ML IVPB       Note to Pharmacy: Crissie Sickles: cabinet override      08/22/22 1724 08/23/22 0845   08/19/22 1030  cefTRIAXone (ROCEPHIN) 1 g in sodium chloride 0.9 % 100 mL IVPB  Status:  Discontinued        1 g 200 mL/hr over 30 Minutes Intravenous Every 24 hours 08/19/22 0937 08/22/22 1715        Assessment/Plan Septic shock VDRF, post-op Colonic perforation secondary to obstructing pelvic mass  POD1 S/P exploratory laparotomy, right hemicolectomy, ileostomy, mucus fistula, biopsy of pelvic mass Dr. Bedelia Person - CEA elevated 143, CA 125 and 19-9 pending  - await surgical pathology  - having ileostomy output - monitor, DC bowel regimen, WOC consult for stoma teaching  - BID wet to dry to midline - drain with feculent appearing fluid, ok to change to hemovac rather than small bulb given high output - high risk for post-op abscess - 2 pressors - maybe start trickle TF tomorrow if pressor requirement decreasing, if  unable to start TF may consider early initiation of TPN - appreciate CCM management   FEN: NPO, LR@ 75 cc/h VTE: ok to have SQH from surgery standpoint  ID: cefepime/flagyl/micafungin   LOS: 5 days     Juliet Rude, [facility] 08/23/2022, 9:28 AM  Please see Amion for pager number during day hours 7:00am-4:30pm

## 2022-08-23 NOTE — Progress Notes (Signed)
RT NOTE:  ALINE removed per MD request due to nonfunction. New ALINE order to be placed.

## 2022-08-23 NOTE — Progress Notes (Signed)
Daily Progress Note  DOA: 08/18/2022 Hospital Day: 6 Chief Complaint: iron deficiency anemia, colonic obstruction 2/2 to pelvic mass, perforation    Assessment and Plan:    Brief Narrative:  Patient is a 72 y.o. year old female with a past medical history not limited to thrombocytosis, HTN, asthma and anxiety. Presented with persistent profound iron deficiency anemia, heme + stools, recent onset of nausea / vomiting and imaging showed bowel obstruction secondary to pelvic mass vrs sigmoid lesion. Unfortunately she had a bowel perforation on 7/6 .   Septic shock on pressors / peritonitis 2/2  perforated bowel She is s/p exploratory laparotomy, R hemicolectomy, ileostomy creation, mucus fistula formation, biopsy of pelvic mass   Severe IDA. She cancelled EGD / colonoscopy several months ago.   -If pelvic mass pathology proves non-colorectal then needs colonoscopy and EGD when stable.    Chronic thrombocytosis. Previously evaluated by Hematology and JAK2 study was negative. Felt to be reactive.   Tiny, acute brain infarcts. Neurology evaluating.  -Work up in progress.    Moderate to severe R hydronephrosis secondary to right pelvic mass ( colonic vrs GYN)  -Urology evaluated She doesn't want stent or other intervention which is reasonable in absence of pain and good renal function    Concern for cirrhosis of RUQ [place].  -Outpatient workup is appropriate    Subjective / New Events:   Sedated , intubated  Objective:   Recent Labs    08/22/22 2052 08/22/22 2116 08/23/22 0153 08/23/22 0203 08/23/22 0922  WBC 25.1*  --   --  25.9* 27.3*  HGB 8.4*   < > 9.9* 8.5* 8.6*  HCT 29.3*   < > 29.0* 30.2* 30.1*  PLT 949*  --   --  637* 514*   < > = values in this interval not displayed.   BMET Recent Labs    08/22/22 2052 08/22/22 2116 08/23/22 0153 08/23/22 0203 08/23/22 0624 08/23/22 0922  NA 141   < > 138 139  --  140  K 5.3*   < > 6.1* 6.0* 6.4* 6.2*  CL 112*  --   --   112*  --  111  CO2 17*  --   --  18*  --  20*  GLUCOSE 93  --   --  112* 114* 113*  BUN 14  --   --  15  --  17  CREATININE 0.90  --   --  0.94  --  0.98  CALCIUM 8.0*  --   --  7.9*  --  8.4*   < > = values in this interval not displayed.   LFT Recent Labs    08/23/22 0922  PROT 4.6*  ALBUMIN 2.4*  AST 14*  ALT 7  ALKPHOS 35*  BILITOT 1.4*   PT/INR Recent Labs    08/21/22 0259  LABPROT 14.4  INR 1.1     Imaging:  DG CHEST PORT 1 VIEW CLINICAL DATA:  Central line care.  EXAM: PORTABLE CHEST 1 VIEW  COMPARISON:  08/22/2022.  FINDINGS: The heart is enlarged and the mediastinal contour stable. There is a small left pleural effusion with atelectasis at the left lung base. No pneumothorax. The distal tip of the left internal jugular central venous catheter terminates over the right atrium. An enteric tube courses over the stomach and out of the field of view. An endotracheal tube terminates 5.3 cm above the carina.  IMPRESSION: 1. Small left pleural effusion with atelectasis  at the left lung base. 2. Left internal jugular central venous catheter terminates over the right atrium. 3. Remaining support apparatus as described above.  Electronically Signed   By: Thornell Sartorius M.D.   On: 08/23/2022 03:54     Scheduled inpatient medications:   arformoterol  15 mcg Nebulization BID   budesonide (PULMICORT) nebulizer solution  0.5 mg Nebulization BID   Chlorhexidine Gluconate Cloth  6 each Topical Q0600   hydrocortisone sod succinate (SOLU-CORTEF) inj  100 mg Intravenous Q12H   insulin aspart  0-9 Units Subcutaneous Q4H   mouth rinse  15 mL Mouth Rinse Q2H   pantoprazole (PROTONIX) IV  40 mg Intravenous Q12H   revefenacin  175 mcg Nebulization Daily   sodium chloride flush  10-40 mL Intracatheter Q12H   sodium zirconium cyclosilicate  10 g Per Tube Once   Continuous inpatient infusions:   sodium chloride 10 mL/hr at 08/23/22 1208   sodium chloride      ceFEPime (MAXIPIME) IV 2 g (08/23/22 0843)   fentaNYL infusion INTRAVENOUS 150 mcg/hr (08/23/22 0842)   lactated ringers 75 mL/hr at 08/23/22 1208   metronidazole 500 mg (08/23/22 0545)   micafungin (MYCAMINE) 100 mg in sodium chloride 0.9 % 100 mL IVPB Stopped (08/23/22 0501)   norepinephrine (LEVOPHED) Adult infusion 24 mcg/min (08/23/22 1150)   propofol (DIPRIVAN) infusion 10 mcg/kg/min (08/23/22 1143)   vasopressin 0.03 Units/min (08/23/22 1148)   PRN inpatient medications: Place/Maintain arterial line **AND** sodium chloride, acetaminophen **OR** acetaminophen, albuterol, fentaNYL, ondansetron **OR** ondansetron (ZOFRAN) IV, mouth rinse, sodium chloride flush  Vital signs in last 24 hours: Temp:  [97.5 F (36.4 C)-98.5 F (36.9 C)] 98.2 F (36.8 C) (07/07 1126) Pulse Rate:  [73-122] 114 (07/07 1030) Resp:  [13-26] 20 (07/07 1251) BP: (83-141)/(25-93) 108/50 (07/07 1030) SpO2:  [89 %-100 %] 99 % (07/07 1030) Arterial Line BP: (68-150)/(39-93) 95/51 (07/07 1030) FiO2 (%):  [40 %-50 %] 40 % (07/07 1251) Weight:  [73.8 kg-77.1 kg] 73.8 kg (07/06 2032) Last BM Date : 08/22/22  Intake/Output Summary (Last 24 hours) at 08/23/2022 1325 Last data filed at 08/23/2022 1208 Gross per 24 hour  Intake 4611.24 ml  Output 1795 ml  Net 2816.24 ml    Intake/Output from previous day: 07/06 0701 - 07/07 0700 In: 3841.2 [I.V.:2824.5; IV Piggyback:1016.7] Out: 1795 [Urine:280; Drains:765; Stool:650; Blood:100] Intake/Output this shift: Total I/O In: 770.1 [I.V.:665.1; IV Piggyback:105] Out: -    Physical Exam:  General: partially sedated, intubated on vent. Heart:  Regular rate and rhythm.  Pulmonary: Normal respiratory effort Abdomen: ileostomy bag with dark brown liquid stool, mucous fistula, large surgical dressing,. A few bowel sounds.   Principal Problem:   GI bleed Active Problems:   Symptomatic anemia   Thrombocytosis   Iron deficiency anemia   Abnormal CT of the abdomen    Colon distention   History of cardioembolic cerebrovascular accident (CVA)   Silent cerebral infarction (HCC)   Perforation bowel (HCC)   Ventilator dependent (HCC)   Abdominal lump   Change in bowel habits   Abnormal CT scan, gastrointestinal tract   Arterial hypotension   Septic shock (HCC)     LOS: 5 days   Willette Cluster ,NP 08/23/2022, 1:25 PM

## 2022-08-23 NOTE — Procedures (Signed)
Arterial Catheter Insertion Procedure Note  Debra Sharp  [id number]  1950-04-09  Date:08/23/22  Time:1:54 AM    Provider Performing: Janett Billow C    Procedure: Insertion of Arterial Line (40981) without US guidance  Indication(s) Blood pressure monitoring and/or need for frequent ABGs  Consent Risks of the procedure as well as the alternatives and risks of each were explained to the patient and/or caregiver.  Consent for the procedure was obtained and is signed in the bedside chart  Anesthesia None   Time Out Verified patient identification, verified procedure, site/side was marked, verified correct patient position, special equipment/implants available, medications/allergies/relevant history reviewed, required imaging and test results available.   Sterile Technique Maximal sterile technique including full sterile barrier drape, hand hygiene, sterile gown, sterile gloves, mask, hair covering, sterile ultrasound probe cover (if used).   Procedure Description Area of catheter insertion was cleaned with chlorhexidine and draped in sterile fashion. Without real-time ultrasound guidance an arterial catheter was placed into the right radial artery.  Appropriate arterial tracings confirmed on monitor.     Complications/Tolerance None; patient tolerated the procedure well.   EBL Minimal   Specimen(s) None

## 2022-08-24 ENCOUNTER — Encounter (HOSPITAL_COMMUNITY): Payer: Self-pay | Admitting: Surgery

## 2022-08-24 ENCOUNTER — Inpatient Hospital Stay (HOSPITAL_COMMUNITY): Payer: Medicare Other

## 2022-08-24 DIAGNOSIS — K922 Gastrointestinal hemorrhage, unspecified: Secondary | ICD-10-CM | POA: Diagnosis not present

## 2022-08-24 DIAGNOSIS — K631 Perforation of intestine (nontraumatic): Secondary | ICD-10-CM | POA: Diagnosis not present

## 2022-08-24 DIAGNOSIS — I639 Cerebral infarction, unspecified: Secondary | ICD-10-CM | POA: Diagnosis not present

## 2022-08-24 DIAGNOSIS — R6521 Severe sepsis with septic shock: Secondary | ICD-10-CM | POA: Diagnosis not present

## 2022-08-24 DIAGNOSIS — A419 Sepsis, unspecified organism: Secondary | ICD-10-CM | POA: Diagnosis not present

## 2022-08-24 LAB — BASIC METABOLIC PANEL
Anion gap: 6 (ref 5–15)
BUN: 25 mg/dL — ABNORMAL HIGH (ref 8–23)
CO2: 20 mmol/L — ABNORMAL LOW (ref 22–32)
Calcium: 8.1 mg/dL — ABNORMAL LOW (ref 8.9–10.3)
Chloride: 109 mmol/L (ref 98–111)
Creatinine, Ser: 1.32 mg/dL — ABNORMAL HIGH (ref 0.44–1.00)
GFR, Estimated: 43 mL/min — ABNORMAL LOW (ref >60)
Glucose, Bld: 161 mg/dL — ABNORMAL HIGH (ref 70–99)
Potassium: 5.8 mmol/L — ABNORMAL HIGH (ref 3.5–5.1)
Sodium: 135 mmol/L (ref 135–145)

## 2022-08-24 LAB — CBC
HCT: 25.2 % — ABNORMAL LOW (ref 36.0–46.0)
Hemoglobin: 7.1 g/dL — ABNORMAL LOW (ref 12.0–15.0)
MCH: 22.2 pg — ABNORMAL LOW (ref 26.0–34.0)
MCHC: 28.2 g/dL — ABNORMAL LOW (ref 30.0–36.0)
MCV: 78.8 fL — ABNORMAL LOW (ref 80.0–100.0)
Platelets: 372 10*3/uL (ref 150–400)
RBC: 3.2 MIL/uL — ABNORMAL LOW (ref 3.87–5.11)
RDW: 25.6 % — ABNORMAL HIGH (ref 11.5–15.5)
WBC: 34.3 10*3/uL — ABNORMAL HIGH (ref 4.0–10.5)
nRBC: 0.1 % (ref 0.0–0.2)

## 2022-08-24 LAB — GLUCOSE, CAPILLARY
Glucose-Capillary: 114 mg/dL — ABNORMAL HIGH (ref 70–99)
Glucose-Capillary: 136 mg/dL — ABNORMAL HIGH (ref 70–99)
Glucose-Capillary: 100 mg/dL — ABNORMAL HIGH (ref 70–99)
Glucose-Capillary: 108 mg/dL — ABNORMAL HIGH (ref 70–99)
Glucose-Capillary: 91 mg/dL (ref 70–99)
Glucose-Capillary: 115 mg/dL — ABNORMAL HIGH (ref 70–99)

## 2022-08-24 LAB — POCT I-STAT 7, (LYTES, BLD GAS, ICA,H+H)
Acid-base deficit: 5 mmol/L — ABNORMAL HIGH (ref 0.0–2.0)
Bicarbonate: 20.8 mmol/L (ref 20.0–28.0)
Calcium, Ion: 1.2 mmol/L (ref 1.15–1.40)
HCT: 24 % — ABNORMAL LOW (ref 36.0–46.0)
Hemoglobin: 8.2 g/dL — ABNORMAL LOW (ref 12.0–15.0)
O2 Saturation: 99 %
Patient temperature: 100.1
Potassium: 5.9 mmol/L — ABNORMAL HIGH (ref 3.5–5.1)
Sodium: 136 mmol/L (ref 135–145)
TCO2: 22 mmol/L (ref 22–32)
pCO2 arterial: 40.6 mmHg (ref 32–48)
pH, Arterial: 7.322 — ABNORMAL LOW (ref 7.35–7.45)
pO2, Arterial: 138 mmHg — ABNORMAL HIGH (ref 83–108)

## 2022-08-24 LAB — CANCER ANTIGEN 19-9: CA 19-9: 50 U/mL — ABNORMAL HIGH (ref 0–35)

## 2022-08-24 LAB — MAGNESIUM: Magnesium: 1.7 mg/dL (ref 1.7–2.4)

## 2022-08-24 LAB — CULTURE, BLOOD (ROUTINE X 2): Culture: NO GROWTH

## 2022-08-24 LAB — PHOSPHORUS: Phosphorus: 3.8 mg/dL (ref 2.5–4.6)

## 2022-08-24 LAB — CA 125: Cancer Antigen (CA) 125: 40 U/mL — ABNORMAL HIGH (ref 0.0–38.1)

## 2022-08-24 MED ORDER — NALOXONE HCL 0.4 MG/ML IJ SOLN
INTRAMUSCULAR | Status: AC
  Administered 2022-08-24: 0.4 mg via INTRAVENOUS
  Filled 2022-08-24: qty 1

## 2022-08-24 MED ORDER — DEXTROSE 50 % IV SOLN
1.0000 | Freq: Once | INTRAVENOUS | Status: AC
  Administered 2022-08-24: 50 mL via INTRAVENOUS
  Filled 2022-08-24: qty 50

## 2022-08-24 MED ORDER — OXYCODONE HCL 5 MG PO TABS
2.5000 mg | ORAL_TABLET | ORAL | Status: DC | PRN
  Administered 2022-08-26 (×2): 5 mg
  Filled 2022-08-24 (×3): qty 1

## 2022-08-24 MED ORDER — DEXMEDETOMIDINE HCL IN NACL 400 MCG/100ML IV SOLN: 0.0000 ug/kg/h | INTRAVENOUS | Status: DC

## 2022-08-24 MED ORDER — ACETAMINOPHEN 10 MG/ML IV SOLN
1000.0000 mg | Freq: Four times a day (QID) | INTRAVENOUS | Status: AC
  Administered 2022-08-24 – 2022-08-25 (×3): 1000 mg via INTRAVENOUS
  Filled 2022-08-24 (×4): qty 100

## 2022-08-24 MED ORDER — DEXMEDETOMIDINE HCL IN NACL 400 MCG/100ML IV SOLN
INTRAVENOUS | Status: AC
  Administered 2022-08-24: 0.2 ug/kg/h via INTRAVENOUS
  Filled 2022-08-24: qty 100

## 2022-08-24 MED ORDER — MAGNESIUM SULFATE 2 GM/50ML IV SOLN
2.0000 g | Freq: Once | INTRAVENOUS | Status: AC
  Administered 2022-08-24: 2 g via INTRAVENOUS
  Filled 2022-08-24: qty 50

## 2022-08-24 MED ORDER — NALOXONE HCL 0.4 MG/ML IJ SOLN: 0.4000 mg | INTRAMUSCULAR | Status: DC | PRN

## 2022-08-24 MED ORDER — NOREPINEPHRINE 4 MG/250ML-% IV SOLN
0.0000 ug/min | INTRAVENOUS | Status: DC
  Administered 2022-08-24: 40 ug/min via INTRAVENOUS
  Administered 2022-08-25: 2 ug/min via INTRAVENOUS
  Filled 2022-08-24: qty 250

## 2022-08-24 MED ORDER — MORPHINE SULFATE (PF) 2 MG/ML IV SOLN
INTRAVENOUS | Status: AC
  Administered 2022-08-24: 2 mg via INTRAVENOUS
  Filled 2022-08-24: qty 1

## 2022-08-24 MED ORDER — MORPHINE SULFATE (PF) 2 MG/ML IV SOLN
2.0000 mg | INTRAVENOUS | Status: DC | PRN
  Administered 2022-08-24: 2 mg via INTRAVENOUS
  Administered 2022-08-25 (×2): 4 mg via INTRAVENOUS
  Administered 2022-08-25: 2 mg via INTRAVENOUS
  Administered 2022-08-25 – 2022-08-26 (×4): 4 mg via INTRAVENOUS
  Administered 2022-08-26: 2 mg via INTRAVENOUS
  Administered 2022-08-26: 4 mg via INTRAVENOUS
  Administered 2022-08-26: 2 mg via INTRAVENOUS
  Administered 2022-08-26: 4 mg via INTRAVENOUS
  Administered 2022-08-26 – 2022-09-13 (×6): 2 mg via INTRAVENOUS
  Filled 2022-08-24: qty 1
  Filled 2022-08-24 (×3): qty 2
  Filled 2022-08-24 (×3): qty 1
  Filled 2022-08-24 (×2): qty 2
  Filled 2022-08-24 (×2): qty 1
  Filled 2022-08-24 (×2): qty 2
  Filled 2022-08-24 (×3): qty 1
  Filled 2022-08-24: qty 2
  Filled 2022-08-24: qty 1

## 2022-08-24 MED ORDER — INSULIN ASPART 100 UNIT/ML IV SOLN
10.0000 [IU] | Freq: Once | INTRAVENOUS | Status: AC
  Administered 2022-08-24: 10 [IU] via INTRAVENOUS

## 2022-08-24 MED ORDER — DEXMEDETOMIDINE HCL IN NACL 400 MCG/100ML IV SOLN
0.0000 ug/kg/h | INTRAVENOUS | Status: DC
  Administered 2022-08-24: 0.6 ug/kg/h via INTRAVENOUS
  Administered 2022-08-25: 0.5 ug/kg/h via INTRAVENOUS
  Administered 2022-08-25: 0.4 ug/kg/h via INTRAVENOUS
  Administered 2022-08-26: 0.5 ug/kg/h via INTRAVENOUS
  Filled 2022-08-24 (×3): qty 100

## 2022-08-24 NOTE — Progress Notes (Signed)
[facility] ADULT ICU REPLACEMENT PROTOCOL   The patient does apply for the [facility] Adult ICU Electrolyte Replacment Protocol based on the criteria listed below:   1.Exclusion criteria: TCTS, ECMO, Dialysis, and Myasthenia Gravis patients 2. Is GFR >/= 30 ml/min? Yes.    Patient's GFR today is 43 3. Is SCr </= 2? Yes.   Patient's SCr is 1.32 mg/dL 4. Did SCr increase >/= 0.5 in 24 hours? No. 5.Pt's weight >40kg  Yes.   6. Abnormal electrolyte(s): Mag  7. Electrolytes replaced per protocol 8.  Call MD STAT for K+ </= 2.5, Phos </= 1, or Mag </= 1 Physician:  Thersa Salt Mckinna Demars 08/24/2022 4:37 AM

## 2022-08-24 NOTE — Procedures (Signed)
Extubation Procedure Note  Patient Details:   Name: Debra Sharp DOB: 18-Sep-1950 MRN: [id number]   Airway Documentation:    Vent end date: 08/24/22 Vent end time: 1615   Evaluation  O2 sats: stable throughout Complications: No apparent complications Patient did tolerate procedure well. Bilateral Breath Sounds: Clear, Diminished   Pt was extubated to 3L [place] with no complications noted with RN a bedside. Cuff leak was heard prior to extubation. Pt was able voice yes to questions, no stridor was noted. Pt is tolerating the 3L [place] at this time, RT will be made available if needed.   Yes  Kaityln Kallstrom Remonia Richter 08/24/2022, 4:20 PM

## 2022-08-24 NOTE — Consult Note (Incomplete)
NEURO HOSPITALIST CONSULT NOTE   Requestig physician: Dr. Merrily Pew  Reason for Consult: Acute onset of left sided weakness and AMS  History obtained from:  Patient   Chart  Patient and Chart   ***  HPI:                                                                                                                                          Debra Sharp is an 72 y.o. female ***  Past Medical History:  Diagnosis Date   Anemia    Anxiety    Arthritis    Asthma    Blood transfusion without reported diagnosis    Cataract    removed bilateral   COPD (chronic obstructive pulmonary disease) (HCC)    Depression    GERD (gastroesophageal reflux disease)    Hyperlipidemia    Hypertension     Past Surgical History:  Procedure Laterality Date   APPENDECTOMY     cataracts Bilateral    COLOSTOMY N/A 08/22/2022   Procedure: COLOSTOMY;  Surgeon: Diamantina Monks, MD;  Location: [facility] OR;  Service: General;  Laterality: N/A;   KNEE SURGERY     LAPAROTOMY N/A 08/22/2022   Procedure: EXPLORATORY LAPAROTOMY;  Surgeon: Diamantina Monks, MD;  Location: [facility] OR;  Service: General;  Laterality: N/A;   NASAL SINUS SURGERY     PARTIAL COLECTOMY N/A 08/22/2022   Procedure: PARTIAL COLECTOMY;  Surgeon: Diamantina Monks, MD;  Location: [facility] OR;  Service: General;  Laterality: N/A;    Family History  Problem Relation Age of Onset   Colon cancer Neg Hx    Colon polyps Neg Hx    Crohn's disease Neg Hx    Esophageal cancer Neg Hx    Rectal cancer Neg Hx    Stomach cancer Neg Hx    Ulcerative colitis Neg Hx             ***  Family History: *** Mother *** Father ***  Social History:  reports that she has quit smoking. Her smoking use included cigarettes. She has never used smokeless tobacco. She reports that she does not drink alcohol and does not use drugs.  Allergies  Allergen Reactions   Aspirin Other (See Comments)    Caused an asthma attack   Ibuprofen Swelling and Other (See  Comments)    Angioedema and Asthma exacerbation   Sulfonamide Derivatives Swelling and Other (See Comments)    Face swelled SEVERELY   Penicillins Rash    MEDICATIONS:                                                                                                                     {  medication reviewed/display:3041432}   ROS:                                                                                                                                       History obtained from {source of history:310783}  General ROS: negative for - chills, fatigue, fever, night sweats, weight gain or weight loss Psychological ROS: negative for - behavioral disorder, hallucinations, memory difficulties, mood swings or suicidal ideation Ophthalmic ROS: negative for - blurry vision, double vision, eye pain or loss of vision ENT ROS: negative for - epistaxis, nasal discharge, oral lesions, sore throat, tinnitus or vertigo Allergy and Immunology ROS: negative for - hives or itchy/watery eyes Hematological and Lymphatic ROS: negative for - bleeding problems, bruising or swollen lymph nodes Endocrine ROS: negative for - galactorrhea, hair pattern changes, polydipsia/polyuria or temperature intolerance Respiratory ROS: negative for - cough, hemoptysis, shortness of breath or wheezing Cardiovascular ROS: negative for - chest pain, dyspnea on exertion, edema or irregular heartbeat Gastrointestinal ROS: negative for - abdominal pain, diarrhea, hematemesis, nausea/vomiting or stool incontinence Genito-Urinary ROS: negative for - dysuria, hematuria, incontinence or urinary frequency/urgency Musculoskeletal ROS: negative for - joint swelling or muscular weakness Neurological ROS: as noted in HPI Dermatological ROS: negative for rash and skin lesion changes   Blood pressure (!) 107/42, pulse (!) 118, temperature 98.1 F (36.7 C), resp. rate (!) 6, height 5' 5.98" (1.676 m), weight 73.8 kg, SpO2 98 %.   General  Examination:                                                                                                       Physical Exam  HEENT-  Normocephalic, no lesions, without obvious abnormality.  Normal external eye and conjunctiva.   Cardiovascular- S1-S2 audible, pulses palpable throughout   Lungs-no rhonchi or wheezing noted, no excessive working breathing.  Saturations within normal limits Abdomen- All 4 quadrants palpated and nontender Extremities- Warm, dry and intact Musculoskeletal-no joint tenderness, deformity or swelling Skin-warm and dry, no hyperpigmentation, vitiligo, or suspicious lesions  Neurological Examination Mental Status: Alert, oriented, thought content appropriate.  Speech fluent without evidence of aphasia.  Able to follow 3 step commands without difficulty. Cranial Nerves: II: Discs flat bilaterally; Visual fields grossly normal,  III,IV, VI: ptosis not present, extra-ocular motions intact bilaterally pupils equal, round, reactive to light and accommodation V,VII: smile symmetric, facial light touch sensation normal bilaterally VIII: hearing normal bilaterally IX,X: uvula rises symmetrically XI: bilateral shoulder shrug XII: midline  tongue extension Motor: Right : Upper extremity   5/5    Left:     Upper extremity   5/5  Lower extremity   5/5     Lower extremity   5/5 Tone and bulk:normal tone throughout; no atrophy noted Sensory: Pinprick and light touch intact throughout, bilaterally Deep Tendon Reflexes: 2+ and symmetric throughout Plantars: Right: downgoing   Left: downgoing Cerebellar: normal finger-to-nose, normal rapid alternating movements and normal heel-to-shin test Gait: normal gait and station   Lab Results: Basic Metabolic Panel: Recent Labs  Lab 08/18/22 0946 08/19/22 0208 08/20/22 0722 08/21/22 0807 08/21/22 1440 08/22/22 2052 08/22/22 2116 08/23/22 0203 08/23/22 0624 08/23/22 0922 08/23/22 1821 08/24/22 0340 08/24/22 0342  NA  137   < >  --  141   < > 141   < > 139  --  140 137 135 136  K 2.8*   < >  --  2.9*   < > 5.3*   < > 6.0* 6.4* 6.2* 6.4* 5.8* 5.9*  CL 105   < >  --  110   < > 112*  --  112*  --  111 110 109  --   CO2 21*   < >  --  17*   < > 17*  --  18*  --  20* 18* 20*  --   GLUCOSE 118*   < >  --  90   < > 93  --  112* 114* 113* 120* 161*  --   BUN 14   < >  --  16   < > 14  --  15  --  17 21 25*  --   CREATININE 0.84   < >  --  0.77   < > 0.90  --  0.94  --  0.98 1.27* 1.32*  --   CALCIUM 8.4*   < >  --  8.5*   < > 8.0*  --  7.9*  --  8.4* 8.2* 8.1*  --   MG 2.0  --  2.2 2.0  --   --   --   --   --   --   --  1.7  --   PHOS  --   --   --   --   --   --   --   --   --   --   --  3.8  --    < > = values in this interval not displayed.    CBC: Recent Labs  Lab 08/22/22 2052 08/22/22 2116 08/23/22 0203 08/23/22 0922 08/23/22 1821 08/24/22 0340 08/24/22 0342  WBC 25.1*  --  25.9* 27.3* 34.5* 34.3*  --   HGB 8.4*   < > 8.5* 8.6* 7.9* 7.1* 8.2*  HCT 29.3*   < > 30.2* 30.1* 27.6* 25.2* 24.0*  MCV 80.7  --  78.6* 80.9 81.2 78.8*  --   PLT 949*  --  637* 514* 414* 372  --    < > = values in this interval not displayed.    Cardiac Enzymes: No results for input(s): "CKTOTAL", "CKMB", "CKMBINDEX", "TROPONINI" in the last 168 hours.  Lipid Panel: Recent Labs  Lab 08/19/22 0208 08/23/22 0203  CHOL 152  --   TRIG 103 50  HDL 37*  --   CHOLHDL 4.1  --   VLDL 21  --   LDLCALC 94  --     Imaging: CT HEAD WO CONTRAST ( )  Result Date: 08/24/2022 CLINICAL DATA:  Neuro deficit, acute, stroke suspected New left sided weakness. EXAM: CT HEAD WITHOUT CONTRAST TECHNIQUE: Contiguous axial images were obtained from the base of the skull through the vertex without intravenous contrast. RADIATION DOSE REDUCTION: This exam was performed according to the departmental dose-optimization program which includes automated exposure control, adjustment of the mA and/or kV according to patient size and/or use of  iterative reconstruction technique. COMPARISON:  08/18/2022 FINDINGS: Brain: There is questionable loss of gray-white differentiation in the high right parietal lobe on images 20 6-28. Cannot exclude acute infarct. The previously seen areas of abnormal punctate diffusion signal on prior MRI not appreciable by CT. There is atrophy and chronic small vessel disease changes. No hydrocephalus or hemorrhage. Vascular: No hyperdense vessel or unexpected calcification. Skull: No acute calvarial abnormality. Sinuses/Orbits: Mucosal thickening diffusely, most pronounced in the left frontal sinus, stable. No air-fluid levels. Other: None IMPRESSION: Questionable area of loss of gray-white differentiation in the high right parietal lobe concerning for area of acute infarction. This could be further evaluated with MRI. Atrophy, chronic small vessel disease. Chronic sinusitis. Electronically Signed   By: Charlett Nose M.D.   On: 08/24/2022 18:06   DG CHEST PORT 1 VIEW  Result Date: 08/23/2022 CLINICAL DATA:  Central line care. EXAM: PORTABLE CHEST 1 VIEW COMPARISON:  08/22/2022. FINDINGS: The heart is enlarged and the mediastinal contour stable. There is a small left pleural effusion with atelectasis at the left lung base. No pneumothorax. The distal tip of the left internal jugular central venous catheter terminates over the right atrium. An enteric tube courses over the stomach and out of the field of view. An endotracheal tube terminates 5.3 cm above the carina. IMPRESSION: 1. Small left pleural effusion with atelectasis at the left lung base. 2. Left internal jugular central venous catheter terminates over the right atrium. 3. Remaining support apparatus as described above. Electronically Signed   By: Thornell Sartorius M.D.   On: 08/23/2022 03:54   DG Chest Port 1 View  Result Date: 08/22/2022 CLINICAL DATA:  Endotracheal tube. EXAM: PORTABLE CHEST 1 VIEW COMPARISON:  04/24/2022. FINDINGS: The heart size and mediastinal  contours are within normal limits. Strandy atelectasis or infiltrate is present at the left lung base. There is a small left pleural effusion. The right lung is clear. No pneumothorax bilaterally. An enteric tube courses over the upper abdomen and out of the field of view. The endotracheal tube terminates 4.8 cm above the carina. IMPRESSION: 1. Small left pleural effusion with atelectasis or infiltrate at the left lung base. 2. Support apparatus as described above. Electronically Signed   By: Thornell Sartorius M.D.   On: 08/22/2022 21:28     Assessment: -   Recommendations: -    Electronically signed: Dr. Caryl Pina 08/24/2022, 6:40 PM

## 2022-08-24 NOTE — Progress Notes (Signed)
NAME:  Debra Sharp, MRN:  [id number], DOB:  October 15, 1950, LOS: 6 ADMISSION DATE:  08/18/2022, CONSULTATION DATE:  08/24/22 REFERRING MD: surgery  , CHIEF COMPLAINT:  Vent management   History of Present Illness:  Debra Sharp is a 72 y.o. F with PMH significant for HTN, HL, HFrEF, thrombocytopenia, COPD, Asthma, thrombocytopenia who presented with weakness and abdominal pain and anemia concerning for UGIB and iron deficiency anemia.  CT abdomen/pelvis was done which showed colonic distension and possible obstruction. She was seen by GI and plan was for EGD, however she could not tolerate prep.  Other problems included R-sided hydronephrosis and incidental acute stroke seen on head CT/MRI.  Repeat CT abd/pelvis on 7/6 showed new intraperitoneal free air with presumed perforation of the colon.   Surgery was consulted and patient went to the OR for an emergent ex-lap, R hemicolectomy, ileostomy with fistula and biopsy of pelvic mass.  Pt was left intubated and PCCM consulted for vent management   Pertinent  Medical History   has a past medical history of Anemia, Anxiety, Arthritis, Asthma, Blood transfusion without reported diagnosis, Cataract, COPD (chronic obstructive pulmonary disease) (HCC), Depression, GERD (gastroesophageal reflux disease), Hyperlipidemia, and Hypertension.  Significant Hospital Events: Including procedures, antibiotic start and stop dates in addition to other pertinent events   7/2 admitted to the hospitalist service  7/6 To OR for ex-lap after concern for bowel perf, left intubated  Interim History / Subjective:  Continues to require vasopressor support though requirement is going down, currently on 10 mics of levo and vasopressin Remained afebrile Tolerating spontaneous breathing trial  Objective   Blood pressure (!) 141/57, pulse 99, temperature 97.9 F (36.6 C), temperature source Oral, resp. rate (!) 21, height 5' 5.98" (1.676 m), weight 73.8 kg, SpO2 100 %.     Vent Mode: PRVC FiO2 (%):  [40 %] 40 % Set Rate:  [18 bmp-24 bmp] 18 bmp Vt Set:  [470 mL-520 mL] 520 mL PEEP:  [5 cmH20] 5 cmH20 Plateau Pressure:  [13 cmH20-22 cmH20] 13 cmH20   Intake/Output Summary (Last 24 hours) at 08/24/2022 0745 Last data filed at 08/24/2022 0600 Gross per 24 hour  Intake 3063.68 ml  Output 2210 ml  Net 853.68 ml   Filed Weights   08/18/22 0941 08/22/22 1718 08/22/22 2032  Weight: 77.1 kg 77.1 kg 73.8 kg      Physical exam: General: Crtitically ill-appearing elderly female, orally intubated HEENT: [place]/AT, eyes anicteric.  ETT and OGT in place Neuro: Eyes closed, does not open, restless and agitated Chest: Reduced air entry at the bases bilaterally, no wheezes or rhonchi Heart: Regular rate and rhythm, no murmurs or gallops Abdomen: Soft, midline laparotomy well-dressed, JP drain is placed in left lower quadrant, colostomy with brown stool noted in right lower quadrant Skin: No rash  Labs and images were reviewed  Resolved Hospital Problem list     Assessment & Plan:  Acute respiratory insufficiency postprocedure COPD, not in exacerbation Continue lung protective ventilation VAP prevention bundle in place PAD protocol with low-dose fentanyl Propofol was stopped this morning Patient is tolerating spontaneous breathing trial, mentation is not clear When sedation is stopped and she wakes up, will try to extubate her today Continue Debra Manns and Pulmicort  Septic shock due to acute peritonitis Acute colonic perforation likely due to obstructing pelvic mass s/p ex lap with right hemicolectomy end ileostomy creation Continue vasopressor support with MAP goal 65 Vasopressin was stopped this morning Patient is on a stress dose steroid  Continue IV antibiotics with cefepime, Flagyl and micafungin General surgery is following Monitor drain output Follow-up biopsy result  Acute UTI, R hydronephrosis Acute kidney injury Hyperkalemia Non-anion  gap metabolic acidosis Likely secondary to obstructing mass  Urology was consulted, options for possible internal stent discussed and patient declined at time White count remained elevated Urinalysis suggestive of UTI Unfortunately urine culture was not sent Serum creatinine has trended up from 0.9 now to 1.3 Currently on bicarbonate infusion Trend ABGs Lactate has cleared  Acute on chronic iron deficiency anemia Hgb on presentation was 4.6, s/p 3 units PRBCs Closely monitor hemoglobin with a goal 7-8  HTN Hyperlipidemia Chronic HFrEF Echo 08/19/2022 EF of 40 to 45% with global hypokinesis, normal RV function Cannot to start GDMT in the setting of shock Monitor intake and output  Best Practice (right click and "Reselect all SmartList Selections" daily)   Diet/type: NPO DVT prophylaxis: SCD GI prophylaxis: PPI Lines: Yes still needed Foley:  Yes, and it is still needed Code Status:  full code Last date of multidisciplinary goals of care discussion: Pending  Labs   CBC: Recent Labs  Lab 08/22/22 2052 08/22/22 2116 08/23/22 0203 08/23/22 0922 08/23/22 1821 08/24/22 0340 08/24/22 0342  WBC 25.1*  --  25.9* 27.3* 34.5* 34.3*  --   HGB 8.4*   < > 8.5* 8.6* 7.9* 7.1* 8.2*  HCT 29.3*   < > 30.2* 30.1* 27.6* 25.2* 24.0*  MCV 80.7  --  78.6* 80.9 81.2 78.8*  --   PLT 949*  --  637* 514* 414* 372  --    < > = values in this interval not displayed.    Basic Metabolic Panel: Recent Labs  Lab 08/18/22 0946 08/19/22 0208 08/20/22 0722 08/21/22 0807 08/21/22 1440 08/22/22 2052 08/22/22 2116 08/23/22 0203 08/23/22 0624 08/23/22 0922 08/23/22 1821 08/24/22 0340 08/24/22 0342  NA 137   < >  --  141   < > 141   < > 139  --  140 137 135 136  K 2.8*   < >  --  2.9*   < > 5.3*   < > 6.0* 6.4* 6.2* 6.4* 5.8* 5.9*  CL 105   < >  --  110   < > 112*  --  112*  --  111 110 109  --   CO2 21*   < >  --  17*   < > 17*  --  18*  --  20* 18* 20*  --   GLUCOSE 118*   < >  --  90    < > 93  --  112* 114* 113* 120* 161*  --   BUN 14   < >  --  16   < > 14  --  15  --  17 21 25*  --   CREATININE 0.84   < >  --  0.77   < > 0.90  --  0.94  --  0.98 1.27* 1.32*  --   CALCIUM 8.4*   < >  --  8.5*   < > 8.0*  --  7.9*  --  8.4* 8.2* 8.1*  --   MG 2.0  --  2.2 2.0  --   --   --   --   --   --   --  1.7  --   PHOS  --   --   --   --   --   --   --   --   --   --   --  3.8  --    < > = values in this interval not displayed.   GFR: Estimated Creatinine Clearance: 40.8 mL/min (A) (by C-G formula based on SCr of 1.32 mg/dL (H)). Recent Labs  Lab 08/22/22 2052 08/23/22 0203 08/23/22 0624 08/23/22 0922 08/23/22 1821 08/24/22 0340  WBC 25.1* 25.9*  --  27.3* 34.5* 34.3*  LATICACIDVEN 1.0 1.2 1.8  --   --   --     Liver Function Tests: Recent Labs  Lab 08/20/22 0321 08/22/22 2052 08/23/22 0922  AST 9* 9* 14*  ALT 9 8 7   ALKPHOS 75 44 35*  BILITOT 0.6 1.9* 1.4*  PROT 6.0* 4.6* 4.6*  ALBUMIN 2.6* 2.7* 2.4*   No results for input(s): "LIPASE", "AMYLASE" in the last 168 hours. No results for input(s): "AMMONIA" in the last 168 hours.  ABG    Component Value Date/Time   PHART 7.322 (L) 08/24/2022 0342   PCO2ART 40.6 08/24/2022 0342   PO2ART 138 (H) 08/24/2022 0342   HCO3 20.8 08/24/2022 0342   TCO2 22 08/24/2022 0342   ACIDBASEDEF 5.0 (H) 08/24/2022 0342   O2SAT 99 08/24/2022 0342     Coagulation Profile: Recent Labs  Lab 08/21/22 0259  INR 1.1    Cardiac Enzymes: No results for input(s): "CKTOTAL", "CKMB", "CKMBINDEX", "TROPONINI" in the last 168 hours.  HbA1C: Hgb A1c MFr Bld  Date/Time Value Ref Range Status  08/20/2022 03:21 AM 4.9 4.8 - 5.6 % Final    Comment:    (NOTE) Pre diabetes:          5.7%-6.4%  Diabetes:              >6.4%  Glycemic control for   <7.0% adults with diabetes     CBG: Recent Labs  Lab 08/23/22 1934 08/23/22 2119 08/23/22 2324 08/24/22 0324 08/24/22 0726  GLUCAP 75 124* 115* 114* 136*    The patient is  critically ill due to septic shock due to acute peritonitis.  Critical care was necessary to treat or prevent imminent or life-threatening deterioration.  Critical care was time spent personally by me on the following activities: development of treatment plan with patient and/or surrogate as well as nursing, discussions with consultants, evaluation of patient's response to treatment, examination of patient, obtaining history from patient or surrogate, ordering and performing treatments and interventions, ordering and review of laboratory studies, ordering and review of radiographic studies, pulse oximetry, re-evaluation of patient's condition and participation in multidisciplinary rounds.   During this encounter critical care time was devoted to patient care services described in this note for 41 minutes.     Cheri Fowler, MD [facility] See Amion for pager If no response to pager, please call [phone] until 7pm After 7pm, Please call E-link [phone]

## 2022-08-24 NOTE — Code Documentation (Signed)
Stroke Response Nurse Documentation Code Documentation  Debra Sharp is a 72 y.o. female admitted to [facility] on 08/18/22. On No antithrombotic. LKW prior to intubation on 08/22/22. Patient on 66M where she was extubated 1615. Staff noticed left sided weakness. CT scan obtained. Code stroke activated.   Stroke team at the bedside after patient activation. Patient with NIHSS 21, see flowsheets for details. Patient is not a candidate for IV Thrombolytic due to out of window. Patient is not a candidate for IR due to no LVO. Care/Plan: q2h NIHSS and vitals. MRI. Bedside handoff with RN Maralyn Sago.    Scarlette Slice K  Rapid Response RN

## 2022-08-24 NOTE — TOC Progression Note (Signed)
Transition of Care [facility]) - Initial/Assessment Note    Patient Details  Name: Debra Sharp MRN: [id number] Date of Birth: [birth year]  Transition of Care [facility]) CM/SW Contact:    Ralene Bathe, LCSW Phone Number: 08/24/2022, 10:36 AM  Clinical Narrative:                 TOC following patient for d/c planning needs once medically stable.  Patient is intubated at this time.   Cleon Gustin, MSW, LCSWA  Expected Discharge Plan: Home/Self Care Barriers to Discharge: Continued Medical Work up   Patient Goals and CMS Choice            Expected Discharge Plan and Services       Living arrangements for the past 2 months: Single Family Home                                      Prior Living Arrangements/Services Living arrangements for the past 2 months: Single Family Home Lives with:: Self Patient language and need for interpreter reviewed:: Yes Do you feel safe going back to the place where you live?: Yes          Current home services: DME (walker/ cane/ shower seat) Criminal Activity/Legal Involvement Pertinent to Current Situation/Hospitalization: No - Comment as needed  Activities of Daily Living Home Assistive Devices/Equipment: None ADL Screening (condition at time of admission) Patient's cognitive ability adequate to safely complete daily activities?: Yes Is the patient deaf or have difficulty hearing?: No Does the patient have difficulty seeing, even when wearing glasses/contacts?: No Does the patient have difficulty concentrating, remembering, or making decisions?: No Patient able to express need for assistance with ADLs?: Yes Does the patient have difficulty dressing or bathing?: No Independently performs ADLs?: Yes (appropriate for developmental age) Does the patient have difficulty walking or climbing stairs?: No Weakness of Legs: Both Weakness of Arms/Hands: None  Permission Sought/Granted                  Emotional  Assessment Appearance:: Appears stated age Attitude/Demeanor/Rapport: Engaged Affect (typically observed): Accepting Orientation: : Oriented to Self, Oriented to Place, Oriented to Situation, Oriented to  Time   Psych Involvement: No (comment)  Admission diagnosis:  GI bleed [K92.2] Patient Active Problem List   Diagnosis Date Noted   Pelvic mass 08/23/2022   Change in bowel habits 08/23/2022   Abnormal CT scan, gastrointestinal tract 08/23/2022   Arterial hypotension 08/23/2022   Septic shock (HCC) 08/23/2022   Fecal occult blood test positive 08/23/2022   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/2022   Hydroureteronephrosis 08/23/2022   Sepsis with acute hypoxic respiratory failure and septic shock (HCC) 08/23/2022   Bowel perforation (HCC) 08/22/2022   Ventilator dependent (HCC) 08/22/2022   Abnormal CT of the abdomen 08/20/2022   Colon distention 08/20/2022   History of cardioembolic cerebrovascular accident (CVA) 08/20/2022   Silent cerebral infarction (HCC) 08/20/2022   GI bleed 08/18/2022   Iron deficiency anemia 04/25/2022   Epistaxis 04/25/2022   CHF (congestive heart failure), NYHA class I, acute, diastolic (HCC) 04/24/2022   Symptomatic anemia 04/24/2022   Thrombocytosis 04/24/2022   Elevated troponin 04/24/2022   Heart failure (HCC) 04/24/2022   LATERAL EPICONDYLITIS, LEFT 04/10/2010   PANIC DISORDER 10/25/2009   DEPRESSION 10/25/2009   COUGH 10/25/2009   SHOULDER PAIN, RIGHT 03/08/2009   Other specified chronic obstructive pulmonary disease 11/30/2008   WEIGHT GAIN 11/30/2008  CATARACT NOS 10/25/2006   Essential hypertension 10/25/2006   ALLERGIC RHINITIS 10/25/2006   ASTHMA 10/25/2006   DERMATOGRAPHIA 10/25/2006   CATARACT EXTRACTION, HX OF 10/25/2006   ARTHROSCOPY, KNEE, HX OF 10/25/2006   PCP:  Clayborn Heron, MD Pharmacy:   [facility] [address] [place] - [address] [place] [address] Phone: [phone] Fax:  [phone]  [facility] [address] [place] [address] Phone: [phone] Fax: [phone]     Social Determinants of Health (SDOH) Social History: SDOH Screenings   Food Insecurity: No Food Insecurity (08/19/2022)  Housing: Low Risk  (08/19/2022)  Transportation Needs: No Transportation Needs (08/19/2022)  Utilities: Not At Risk (08/19/2022)  Tobacco Use: Medium Risk (08/22/2022)   SDOH Interventions:     Readmission Risk Interventions     No data to display

## 2022-08-24 NOTE — Consult Note (Signed)
WOC Nurse ostomy consult note; exp lap w/R hemicolectomy; ileostomy and mucus fistula 08/22/2022 by Dr. Bedelia Person  Stoma type/location:  RMQ ileostomy, RLQ mucus fistula  Stomal assessment/size:  1 3/4" ileostomy, red moist, well budded, slightly oval; mucus fistula 1 3/8" round, red moist  Peristomal assessment: patient with skin fold overhanging superior portion of stoma, creasing at 3 o'clock  Treatment options for stomal/peristomal skin: filled in creases with flattened strips of 2" barrier ring then placed barrier ring around stoma itself  Output  applesauce consistency dark effluent in bedside drainage bag from ileostomy, dark effluent from mucus fistula  Ostomy pouching: Medium Kathrine Cords  [facility] [place] [phone]) pouch placed to cover both ileostomy and fistula; pattern left in room.  If pouch leaks, use pattern in the room to cut new pouch opening. Remove old Eakin, clean skin with water moistened washcloth, dry. Crust over any irritated skin as follows: Sprinkle ostomy powder over the irritated skin, tap over powder with damp wash cloth or skin barrier wipe (Cavilon No-Sting), brushing away excess powder to form a layer of "crust". Ok to apply 3 layers as described above.  Place barrier ring around stoma; Place new Eakin pouch Write date on pouch.  Ordered 3 medium pouches for room.     Education provided: patient is intubated at this time, no family in room. No education provided at this time.   Enrolled patient in [employer] DC program: no, not yet.    WOC team will continue to follow patient for ostomy education and support.   Thank you,    Priscella Mann MSN, RN-BC, [employer] [phone]

## 2022-08-24 NOTE — Progress Notes (Signed)
Progress Note  2 Days Post-Op  Subjective:  Weaning on vent Weaning pressors - on 8 mcg levo, stress dose steroids Productive ostomy and mucous fistula.   Objective: Vital signs in last 24 hours: Temp:  [97.9 F (36.6 C)-100.1 F (37.8 C)] 97.9 F (36.6 C) (07/08 0727) Pulse Rate:  [90-142] 106 (07/08 0800) Resp:  [10-26] 13 (07/08 0800) BP: (91-174)/(35-72) 141/54 (07/08 0800) SpO2:  [84 %-100 %] 98 % (07/08 0800) Arterial Line BP: (37-147)/(23-87) 139/51 (07/08 0800) FiO2 (%):  [40 %] 40 % (07/08 0737) Last BM Date : (P) 08/24/22  Intake/Output from previous day: 07/07 0701 - 07/08 0700 In: 3537.5 [I.V.:3065; IV Piggyback:472.5] Out: 2210 [Urine:285; Emesis/NG output:400; Drains:1025; Stool:500] Intake/Output this shift: Total I/O In: 150.9 [I.V.:123.4; IV Piggyback:27.5] Out: -   PE: General: WD, ill appearing female who is sedated on the vent Heart: sinus tachycardia.  Palpable pedal pulses bilaterally Lungs: vented Abd: soft, ileostomy viable, mucus fistula viable, JP with feculent appearing fluid - high volume, midline wound clean with intact fascia and some fat necrosis.  Ileostomy: > 500 mL nonbloody stool  Mucous fistula: productive of stool.   Blake drain LLQ (terminates in R paracolic gutter): 1,025 mL, blake drain exchanged for hemovac : SS drainage in tubing, still appears to contain some stool in cannister.  NG: 400 mL / 24h GU: foley present (285 mL)    Lab Results:  Recent Labs    08/23/22 1821 08/24/22 0340 08/24/22 0342  WBC 34.5* 34.3*  --   HGB 7.9* 7.1* 8.2*  HCT 27.6* 25.2* 24.0*  PLT 414* 372  --    BMET Recent Labs    08/23/22 1821 08/24/22 0340 08/24/22 0342  NA 137 135 136  K 6.4* 5.8* 5.9*  CL 110 109  --   CO2 18* 20*  --   GLUCOSE 120* 161*  --   BUN 21 25*  --   CREATININE 1.27* 1.32*  --   CALCIUM 8.2* 8.1*  --    PT/INR No results for input(s): "LABPROT", "INR" in the last 72 hours.  CMP     Component Value  Date/Time   NA 136 08/24/2022 0342   K 5.9 (H) 08/24/2022 0342   CL 109 08/24/2022 0340   CO2 20 (L) 08/24/2022 0340   GLUCOSE 161 (H) 08/24/2022 0340   BUN 25 (H) 08/24/2022 0340   CREATININE 1.32 (H) 08/24/2022 0340   CALCIUM 8.1 (L) 08/24/2022 0340   PROT 4.6 (L) 08/23/2022 0922   ALBUMIN 2.4 (L) 08/23/2022 0922   AST 14 (L) 08/23/2022 0922   ALT 7 08/23/2022 0922   ALKPHOS 35 (L) 08/23/2022 0922   BILITOT 1.4 (H) 08/23/2022 0922   GFRNONAA 43 (L) 08/24/2022 0340   GFRAA >60 05/11/2015 0231   Lipase  No results found for: "LIPASE"     Studies/Results: DG CHEST PORT 1 VIEW  Result Date: 08/23/2022 CLINICAL DATA:  Central line care. EXAM: PORTABLE CHEST 1 VIEW COMPARISON:  08/22/2022. FINDINGS: The heart is enlarged and the mediastinal contour stable. There is a small left pleural effusion with atelectasis at the left lung base. No pneumothorax. The distal tip of the left internal jugular central venous catheter terminates over the right atrium. An enteric tube courses over the stomach and out of the field of view. An endotracheal tube terminates 5.3 cm above the carina. IMPRESSION: 1. Small left pleural effusion with atelectasis at the left lung base. 2. Left internal jugular central venous catheter terminates  over the right atrium. 3. Remaining support apparatus as described above. Electronically Signed   By: Thornell Sartorius M.D.   On: 08/23/2022 03:54   DG Chest Port 1 View  Result Date: 08/22/2022 CLINICAL DATA:  Endotracheal tube. EXAM: PORTABLE CHEST 1 VIEW COMPARISON:  04/24/2022. FINDINGS: The heart size and mediastinal contours are within normal limits. Strandy atelectasis or infiltrate is present at the left lung base. There is a small left pleural effusion. The right lung is clear. No pneumothorax bilaterally. An enteric tube courses over the upper abdomen and out of the field of view. The endotracheal tube terminates 4.8 cm above the carina. IMPRESSION: 1. Small left pleural  effusion with atelectasis or infiltrate at the left lung base. 2. Support apparatus as described above. Electronically Signed   By: Thornell Sartorius M.D.   On: 08/22/2022 21:28   CT ABDOMEN PELVIS W CONTRAST  Addendum Date: 08/22/2022   ADDENDUM REPORT: 08/22/2022 16:51 ADDENDUM: Critical Value/emergent results were called by telephone at the time of interpretation on 08/22/2022 at 4:39 pm to provider PAULA GUENTHER , who verbally acknowledged these results. Electronically Signed   By: Genevive Bi M.D.   On: 08/22/2022 16:51   Result Date: 08/22/2022 CLINICAL DATA:  Bowel obstruction. EXAM: CT ABDOMEN AND PELVIS WITH CONTRAST TECHNIQUE: Multidetector CT imaging of the abdomen and pelvis was performed using the standard protocol following bolus administration of intravenous contrast. RADIATION DOSE REDUCTION: This exam was performed according to the departmental dose-optimization program which includes automated exposure control, adjustment of the mA and/or kV according to patient size and/or use of iterative reconstruction technique. CONTRAST:  75mL OMNIPAQUE IOHEXOL 350 MG/ML SOLN COMPARISON:  CT 08/19/2022 com plain film 08/22/2018 FINDINGS: Lower chest: Small bilateral pleural effusions. Hepatobiliary: No focal hepatic lesion. Normal gallbladder. No biliary duct dilatation. Common bile duct is normal. Pancreas: Pancreas is normal. No ductal dilatation. No pancreatic inflammation. Spleen: Normal spleen Adrenals/urinary tract: Adrenal glands normal. Severe hydronephrosis of the RIGHT kidney. Severe hydroureter on the RIGHT. Findings not changed from comparison exam. Hydroureter extends in the pelvis. LEFT kidney normal. Stomach/Bowel: NG tube in stomach. This fluid within the distal esophagus. The stomach is nondistended. Proximal small bowel is normal caliber. Again demonstrated marked distension of the colon. There is new intraperitoneal free air noted anterior to the liver (image 12/3). Scattered free air in  the falciform ligament (image 14/3). Small amount of free air noted in the ventral peritoneal space of the mid abdomen. Minimal free fluid in the abdomen pelvis. No clear source of bowel perforation of presumably from the colon. Vascular/Lymphatic: Abdominal aorta is normal caliber. No periportal or retroperitoneal adenopathy. No pelvic adenopathy. Reproductive: Calcified enlarged uterine fibroid. Complex mass in the RIGHT adnexa measuring 8.5 by 6.7 cm. Other: No free fluid. Musculoskeletal: No aggressive osseous lesion. IMPRESSION: 1. New intraperitoneal free air. Presumed perforation of the colon. No clear site of perforation identified. Minimal intraperitoneal free fluid. 2. Fluid within the esophagus presumably related to bowel obstruction. NG tube in stomach. 3. Proximal small bowel is nondistended. 4. Colon is markedly distended with air-fluid levels. 5. Severe hydronephrosis of the RIGHT kidney and RIGHT hydroureter. Potential obstructing RIGHT adnexal mass. Electronically Signed: By: Genevive Bi M.D. On: 08/22/2022 16:29   DG Abd 1 View  Result Date: 08/22/2022 CLINICAL DATA:  Nasogastric tube placement.  Bowel obstruction. EXAM: ABDOMEN - 1 VIEW COMPARISON:  Earlier today FINDINGS: Tip and side port of the enteric tube below the diaphragm in the stomach. Gaseous  bowel distention in the upper abdomen. IMPRESSION: Tip and side port of the enteric tube below the diaphragm in the stomach. Electronically Signed   By: Narda Rutherford M.D.   On: 08/22/2022 15:57    Anti-infectives: Anti-infectives (From admission, onward)    Start     Dose/Rate Route Frequency Ordered Stop   08/23/22 2100  ceFEPIme (MAXIPIME) 2 g in sodium chloride 0.9 % 100 mL IVPB        2 g 200 mL/hr over 30 Minutes Intravenous Every 12 hours 08/23/22 1357     08/23/22 0400  micafungin (MYCAMINE) 100 mg in sodium chloride 0.9 % 100 mL IVPB        100 mg 105 mL/hr over 1 Hours Intravenous Daily 08/23/22 0242     08/22/22  1800  metroNIDAZOLE (FLAGYL) IVPB 500 mg        500 mg 100 mL/hr over 60 Minutes Intravenous Every 12 hours 08/22/22 1711     08/22/22 1800  ceFEPIme (MAXIPIME) 2 g in sodium chloride 0.9 % 100 mL IVPB  Status:  Discontinued        2 g 200 mL/hr over 30 Minutes Intravenous Every 8 hours 08/22/22 1716 08/23/22 1357   08/22/22 1724  metroNIDAZOLE (FLAGYL) 500 MG/100ML IVPB       Note to Pharmacy: [name]: cabinet override      08/22/22 1724 08/23/22 0845   08/19/22 1030  cefTRIAXone (ROCEPHIN) 1 g in sodium chloride 0.9 % 100 mL IVPB  Status:  Discontinued        1 g 200 mL/hr over 30 Minutes Intravenous Every 24 hours 08/19/22 0937 08/22/22 1715        Assessment/Plan Septic shock VDRF, post-op Colonic perforation secondary to obstructing pelvic mass  POD2 S/P exploratory laparotomy, right hemicolectomy, ileostomy, mucus fistula, biopsy of pelvic mass Dr. Bedelia Person - WBC 34.5 from 34.5, hgb 7.1 from 8.6 - CEA elevated 143, CA 125 and 19-9 pending  - await surgical pathology  - having ileostomy output - monitor, DC bowel regimen, WOC consult for stoma teaching and assistance with pouching. - BID wet to dry to midline - scant blue/green staining on dressing, monitor for now, hold off on dakins.  - weaning from vent. Ok for SLP eval/sips of clears if extubated.  - appreciate CCM management   FEN: NPO, LR@ 75 cc/h VTE: ok to have SQH from surgery standpoint  ID: cefepime/flagyl/micafungin  Foley: in place for strict I&Os, low UOP   LOS: 6 days     Adam Phenix, [facility] 08/24/2022, 9:08 AM Please see Amion for pager number during day hours 7:00am-4:30pm

## 2022-08-25 ENCOUNTER — Inpatient Hospital Stay (HOSPITAL_COMMUNITY): Payer: Medicare Other

## 2022-08-25 DIAGNOSIS — N179 Acute kidney failure, unspecified: Secondary | ICD-10-CM

## 2022-08-25 DIAGNOSIS — K631 Perforation of intestine (nontraumatic): Secondary | ICD-10-CM

## 2022-08-25 DIAGNOSIS — R6520 Severe sepsis without septic shock: Secondary | ICD-10-CM | POA: Diagnosis not present

## 2022-08-25 DIAGNOSIS — G9341 Metabolic encephalopathy: Secondary | ICD-10-CM | POA: Diagnosis not present

## 2022-08-25 DIAGNOSIS — E440 Moderate protein-calorie malnutrition: Secondary | ICD-10-CM | POA: Insufficient documentation

## 2022-08-25 DIAGNOSIS — R569 Unspecified convulsions: Secondary | ICD-10-CM

## 2022-08-25 DIAGNOSIS — R4182 Altered mental status, unspecified: Secondary | ICD-10-CM | POA: Diagnosis not present

## 2022-08-25 DIAGNOSIS — A419 Sepsis, unspecified organism: Secondary | ICD-10-CM | POA: Diagnosis not present

## 2022-08-25 DIAGNOSIS — G9340 Encephalopathy, unspecified: Secondary | ICD-10-CM

## 2022-08-25 LAB — BPAM RBC
Blood Product Expiration Date: 202407132359
Blood Product Expiration Date: 202407282359
Blood Product Expiration Date: 202407292359
Blood Product Expiration Date: 202408022359
Unit Type and Rh: 6200

## 2022-08-25 LAB — TYPE AND SCREEN
ABO/RH(D): A POS
Unit division: 0
Unit division: 0

## 2022-08-25 LAB — BASIC METABOLIC PANEL
Anion gap: 7 (ref 5–15)
BUN: 31 mg/dL — ABNORMAL HIGH (ref 8–23)
CO2: 23 mmol/L (ref 22–32)
Calcium: 7.9 mg/dL — ABNORMAL LOW (ref 8.9–10.3)
Chloride: 105 mmol/L (ref 98–111)
Creatinine, Ser: 1.26 mg/dL — ABNORMAL HIGH (ref 0.44–1.00)
GFR, Estimated: 46 mL/min — ABNORMAL LOW (ref >60)
Glucose, Bld: 126 mg/dL — ABNORMAL HIGH (ref 70–99)
Potassium: 4.5 mmol/L (ref 3.5–5.1)
Sodium: 135 mmol/L (ref 135–145)

## 2022-08-25 LAB — CBC
HCT: 20 % — ABNORMAL LOW (ref 36.0–46.0)
Hemoglobin: 5.8 g/dL — CL (ref 12.0–15.0)
MCH: 22.3 pg — ABNORMAL LOW (ref 26.0–34.0)
MCHC: 29 g/dL — ABNORMAL LOW (ref 30.0–36.0)
MCV: 76.9 fL — ABNORMAL LOW (ref 80.0–100.0)
Platelets: 273 10*3/uL (ref 150–400)
RBC: 2.6 MIL/uL — ABNORMAL LOW (ref 3.87–5.11)
RDW: 26.9 % — ABNORMAL HIGH (ref 11.5–15.5)
WBC: 37.2 10*3/uL — ABNORMAL HIGH (ref 4.0–10.5)
nRBC: 0.1 % (ref 0.0–0.2)

## 2022-08-25 LAB — SURGICAL PATHOLOGY

## 2022-08-25 LAB — CULTURE, BLOOD (ROUTINE X 2)

## 2022-08-25 LAB — GLUCOSE, CAPILLARY
Glucose-Capillary: 107 mg/dL — ABNORMAL HIGH (ref 70–99)
Glucose-Capillary: 133 mg/dL — ABNORMAL HIGH (ref 70–99)
Glucose-Capillary: 103 mg/dL — ABNORMAL HIGH (ref 70–99)
Glucose-Capillary: 100 mg/dL — ABNORMAL HIGH (ref 70–99)
Glucose-Capillary: 94 mg/dL (ref 70–99)

## 2022-08-25 LAB — HEMOGLOBIN AND HEMATOCRIT, BLOOD
HCT: 21.5 % — ABNORMAL LOW (ref 36.0–46.0)
HCT: 27.8 % — ABNORMAL LOW (ref 36.0–46.0)
Hemoglobin: 6.6 g/dL — CL (ref 12.0–15.0)
Hemoglobin: 8.9 g/dL — ABNORMAL LOW (ref 12.0–15.0)

## 2022-08-25 LAB — PREPARE RBC (CROSSMATCH)

## 2022-08-25 MED ORDER — VITAL 1.5 CAL PO LIQD
1000.0000 mL | ORAL | Status: DC
  Administered 2022-08-26: 1000 mL

## 2022-08-25 MED ORDER — PROSOURCE TF20 ENFIT COMPATIBL EN LIQD
60.0000 mL | Freq: Every day | ENTERAL | Status: DC
  Administered 2022-08-25 – 2022-09-03 (×10): 60 mL
  Filled 2022-08-25 (×11): qty 60

## 2022-08-25 MED ORDER — HYDRALAZINE HCL 20 MG/ML IJ SOLN
10.0000 mg | INTRAMUSCULAR | Status: DC | PRN
  Administered 2022-08-25: 10 mg via INTRAVENOUS
  Filled 2022-08-25: qty 1

## 2022-08-25 MED ORDER — SODIUM CHLORIDE 0.9% IV SOLUTION: Freq: Once | INTRAVENOUS | Status: AC

## 2022-08-25 MED ORDER — ADULT MULTIVITAMIN W/MINERALS CH
1.0000 | ORAL_TABLET | Freq: Every day | ORAL | Status: DC
  Administered 2022-08-25 – 2022-09-03 (×10): 1
  Filled 2022-08-25 (×11): qty 1

## 2022-08-25 MED ORDER — NOREPINEPHRINE 4 MG/250ML-% IV SOLN
0.0000 ug/min | INTRAVENOUS | Status: DC
  Administered 2022-08-25: 4 ug/min via INTRAVENOUS

## 2022-08-25 MED ORDER — ATORVASTATIN CALCIUM 10 MG PO TABS
20.0000 mg | ORAL_TABLET | Freq: Every day | ORAL | Status: DC
  Administered 2022-08-25 – 2022-09-03 (×10): 20 mg
  Filled 2022-08-25 (×11): qty 2

## 2022-08-25 NOTE — Evaluation (Signed)
Physical Therapy Evaluation Patient Details Name: Debra Sharp MRN: [id number] DOB: 04-30-1950 Today's Date: 08/25/2022  History of Present Illness  72 yo female admitted 7/2 with generalized weakness, thrombocytosis and anemia. 7/2 MRI scattered punctate ischemic infarcts right parietal, left frontal, right cerebellum. Pt with progression to AKI with septic peritonitis. 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 7/8 extubated. Lt sided weakness noted post extubation with CT demonstrating possible Rt parietal infarct. PMHx: COPD, anemia, CHF, HLD, HTN  Clinical Impression  Pt with restless movement of right hemibody with eyes closed and frequent yelling out (sounds like a train whistle). Pt unable to follow any commands with only response to noxious stimuli LLE and flaccid LUE. Pt total +2 assist for mobility at this time. Pt without family present to confirm home setup and PLOF with son not responding to call. Pt with decreased strength, cognition, transfers and mobility who will benefit from acute therapy to maximize strength, function and balance to decrease burden of care. Patient will benefit from continued inpatient follow up therapy, <3 hours/day if able to demonstrate active participation.         Assistance Recommended at Discharge Frequent or constant Supervision/Assistance  If plan is discharge home, recommend the following:  Can travel by private vehicle  Two people to help with walking and/or transfers;Two people to help with bathing/dressing/bathroom;Assistance with cooking/housework;Assistance with feeding;Direct supervision/assist for medications management;Assist for transportation;Direct supervision/assist for financial management   No    Equipment Recommendations Wheelchair (measurements PT);Hospital bed;Wheelchair cushion (measurements PT);Other (comment) (hoyer)  Recommendations for Other Services        Functional Status Assessment Patient has had a recent decline in their functional status and/or demonstrates limited ability to make significant improvements in function in a reasonable and predictable amount of time     Precautions / Restrictions Precautions Precautions: Fall;Other (comment) Precaution Comments: cortrak, ostomy, Rt fem line, RUE mitten Restrictions Weight Bearing Restrictions: No      Mobility  Bed Mobility Overal bed mobility: Needs Assistance Bed Mobility: Rolling, Supine to Sit, Sit to Supine Rolling: Total assist   Supine to sit: Total assist Sit to supine: Total assist   General bed mobility comments: total assist to roll with +2 for safety and positioning. Use of chair egress function to transition supine<>sit . Pt total +2 assist for anterior translation of trunk in sitting. Total +2 to slide toward [facility]    Transfers                   General transfer comment: unable    Ambulation/Gait                  Stairs            Wheelchair Mobility     Tilt Bed    Modified Rankin (Stroke Patients Only)       Balance Overall balance assessment: Needs assistance   Sitting balance-Leahy Scale: Zero                                       Pertinent Vitals/Pain Pain Assessment Pain Assessment: CPOT Facial Expression: Tense Body Movements: Restlessness Muscle Tension: Relaxed Compliance with ventilator (intubated pts.): N/A Vocalization (extubated pts.): Sighing, moaning CPOT Total: 4 Pain Intervention(s): Limited activity within patient's tolerance, Repositioned, Monitored during session    Home Living Family/patient expects to  be discharged to:: Private residence Living Arrangements: Alone                 Additional Comments: pt unable to provide and son did not answer call    Prior Function                       Hand Dominance        Extremity/Trunk Assessment   Upper  Extremity Assessment Upper Extremity Assessment: RUE deficits/detail;LUE deficits/detail RUE Deficits / Details: restless with frequent shoulder flexion and moving arm without purposeful intent, unable to move to command LUE Deficits / Details: flaccid with PROM WFL without withdrawal to noxious stimli    Lower Extremity Assessment Lower Extremity Assessment: LLE deficits/detail;RLE deficits/detail RLE Deficits / Details: frequent non purposeful movement of hip flexion and knee extension LLE Deficits / Details: withdrawal to noxious stimuli, increased tone with PROM    Cervical / Trunk Assessment Cervical / Trunk Assessment: Kyphotic  Communication      Cognition Arousal/Alertness: Lethargic Behavior During Therapy: Restless Overall Cognitive Status: Impaired/Different from baseline                                 General Comments: pt with eyes closed throughout session with constant moaning/yelling out with frequent Rt sided movement. pt unable to respond to any cues/commands and presents with restless, non purposeful movement        General Comments      Exercises     Assessment/Plan    PT Assessment Patient needs continued PT services  PT Problem List Decreased range of motion;Decreased coordination;Decreased strength;Decreased mobility;Decreased safety awareness;Decreased activity tolerance;Decreased cognition;Decreased balance;Decreased knowledge of use of DME       PT Treatment Interventions DME instruction;Therapeutic activities;Cognitive remediation;Balance training;Functional mobility training;Neuromuscular re-education;Patient/family education;Therapeutic exercise    PT Goals (Current goals can be found in the Care Plan section)  Acute Rehab PT Goals PT Goal Formulation: Patient unable to participate in goal setting Time For Goal Achievement: 09/08/22 Potential to Achieve Goals: Fair    Frequency Min 2X/week     Co-evaluation                AM-PAC PT "6 Clicks" Mobility  Outcome Measure Help needed turning from your back to your side while in a flat bed without using bedrails?: Total Help needed moving from lying on your back to sitting on the side of a flat bed without using bedrails?: Total Help needed moving to and from a bed to a chair (including a wheelchair)?: Total Help needed standing up from a chair using your arms (e.g., wheelchair or bedside chair)?: Total Help needed to walk in hospital room?: Total Help needed climbing 3-5 steps with a railing? : Total 6 Click Score: 6    End of Session   Activity Tolerance: Patient tolerated treatment well Patient left: in bed;with call bell/phone within reach;with bed alarm set Nurse Communication: Mobility status;Need for lift equipment PT Visit Diagnosis: Other abnormalities of gait and mobility (R26.89);Muscle weakness (generalized) (M62.81);Other symptoms and signs involving the nervous system (R29.898);Hemiplegia and hemiparesis Hemiplegia - Right/Left: Left Hemiplegia - dominant/non-dominant: Non-dominant Hemiplegia - caused by: Cerebral infarction    Time: 1610-9604 PT Time Calculation (min) (ACUTE ONLY): 15 min   Charges:   PT Evaluation $PT Eval Moderate Complexity: 1 Mod   PT General Charges $$ ACUTE PT VISIT: 1 Visit  Merryl Hacker, PT Acute Rehabilitation Services Office: [phone]   Cristine Polio 08/25/2022, 10:45 AM

## 2022-08-25 NOTE — Progress Notes (Signed)
EEG complete - results pending 

## 2022-08-25 NOTE — Progress Notes (Signed)
Called patient's son twice to give him an update - no answer. Left voicemail.  Spoke with pathology assigned to the case (has not been reviewed yet) - asked for call when prelim diagnosis is available.  Eugene Garnet MD Gynecologic Oncology

## 2022-08-25 NOTE — Progress Notes (Signed)
eLink Physician-Brief Progress Note Patient Name: Debra Sharp DOB: [birth year] MRN: [id number]   Date of Service  08/25/2022  HPI/Events of Note  72 year old female with a history of COPD and chronic iron deficiency anemia who initially presented with acute urinary tract infection complicated by hydronephrosis, AKI and septic shock due to peritonitis in the setting of colonic perforation from an obstructing mass.  Notified tonight about hemoglobin of 5.8.  No active bleeding noted.  She has been hovering around 7-8 and I suspect a component of this is dilutional.  eICU Interventions  Will transfuse 1 unit PRBC, type and screen expires at midnight tonight.     Intervention Category Intermediate Interventions: Bleeding - evaluation and treatment with blood products  Tex Conroy 08/25/2022, 5:43 AM

## 2022-08-25 NOTE — Progress Notes (Addendum)
NAME:  Debra Sharp, MRN:  [id number], DOB:  January 19, 1951, LOS: 7 ADMISSION DATE:  08/18/2022, CONSULTATION DATE:  08/25/22 REFERRING MD: surgery  , CHIEF COMPLAINT:  Vent management   History of Present Illness:  Debra Sharp is a 72 y.o. F with PMH significant for HTN, HL, HFrEF, thrombocytopenia, COPD, Asthma, thrombocytopenia who presented with weakness and abdominal pain and anemia concerning for UGIB and iron deficiency anemia.  CT abdomen/pelvis was done which showed colonic distension and possible obstruction. She was seen by GI and plan was for EGD, however she could not tolerate prep.  Other problems included R-sided hydronephrosis and incidental acute stroke seen on head CT/MRI.  Repeat CT abd/pelvis on 7/6 showed new intraperitoneal free air with presumed perforation of the colon.   Surgery was consulted and patient went to the OR for an emergent ex-lap, R hemicolectomy, ileostomy with fistula and biopsy of pelvic mass.  Pt was left intubated and PCCM consulted for vent management   Pertinent  Medical History   has a past medical history of Anemia, Anxiety, Arthritis, Asthma, Blood transfusion without reported diagnosis, Cataract, COPD (chronic obstructive pulmonary disease) (HCC), Depression, GERD (gastroesophageal reflux disease), Hyperlipidemia, and Hypertension.  Significant Hospital Events: Including procedures, antibiotic start and stop dates in addition to other pertinent events   7/2 admitted to the hospitalist service  7/6 To OR for ex-lap after concern for bowel perf, left intubated 7/7 developed shock on levo; CVL and a-line placed 7/8 exutbated; post extubation left sided weakness and encephalopathic; neuro re-consulted; MRI and EEG ordered 7/9 off pressors  Interim History / Subjective:  Extubated yesterday Hgb overnight 5.8 (diluted?); transfused 1 unit Remains encephalopathic this am Off pressors  Objective   Blood pressure (!) 170/72, pulse (!) 116, temperature  (!) 97.4 F (36.3 C), temperature source Axillary, resp. rate 10, height 5' 5.98" (1.676 m), weight 73.8 kg, SpO2 94 %.    Vent Mode: PRVC FiO2 (%):  [40 %] 40 % Set Rate:  [18 bmp] 18 bmp Vt Set:  [520 mL] 520 mL PEEP:  [5 cmH20] 5 cmH20 Plateau Pressure:  [14 cmH20-16 cmH20] 16 cmH20   Intake/Output Summary (Last 24 hours) at 08/25/2022 0854 Last data filed at 08/25/2022 0845 Gross per 24 hour  Intake 3793.84 ml  Output 1015 ml  Net 2778.84 ml    Filed Weights   08/18/22 0941 08/22/22 1718 08/22/22 2032  Weight: 77.1 kg 77.1 kg 73.8 kg      Physical exam: General:  ill appearing female w/ delirium HEENT: MM pink/moist; ng tube in place Neuro: Delirious and moaning;  CV: s1s2, RRR, no m/r/g PULM:  dim clear BS bilaterally; RA GI: soft, JP drain in place; colostomy w/ brown stool Extremities: warm/dry, no edema  Skin: no rashes or lesions appreciated  Resolved Hospital Problem list     Assessment & Plan:  Acute respiratory insufficiency postprocedure COPD, not in exacerbation Plan: -extubated yesterday -pulm toiletry -PT/OT -on room air; sat goal >92% -cont yupelri, brovana, pulmicort -prn albuterol for wheezing  Septic shock due to acute peritonitis: improved Plan: -off pressors; map goal >65 -transfused 1 unit this am; repeat h/h pending -stop stress dose steroids -cont cefepime, flagyl, micafungin -f/u cultures -trend wbc/fever curve  Acute colonic perforation likely due to obstructing pelvic mass s/p ex lap with right hemicolectomy end ileostomy creation Plan: -general surgery following; appreciate recs -abx as above -NG tube in place -monitor jp drain outpt -f/u biopsy results -prn oxy and morphine for pain;  scheduled tylenol  Acute metabolic encephalopathy Incidental acute CVA  -MRI scattered punctate ischemic infarcts right parietal, left frontal, right cerebellum. MRI with contrast 7/4 single punctate enhancement in right frontal parietal, no  evidence of mets at this time.  -EEG 7/9 no seizure Plan: -neuro following -yesterday patient w/ decreased left sided mvt and delirious -patient started on precedex today for encephalopathy -repeat MRI ordered -neuro checks  -allergic to asa; will need plavix when cleared by GI -consider statin -PT/OT; SLP when appropriate -delirium precautions -limit sedating meds  Acute UTI, R hydronephrosis Acute kidney injury Hyperkalemia Non-anion gap metabolic acidosis Likely secondary to obstructing mass  Urology was consulted, options for possible internal stent discussed and patient declined at time Plan: -cont abx as above -Trend BMP / urinary output -Replace electrolytes as indicated -Avoid nephrotoxic agents, ensure adequate renal perfusion  Acute on chronic iron deficiency anemia Possible GI bleed -unable to tolerate prep for EGD Plan: -transfused 1 unit this am -repeat H/H pending -trend H/H and transfuse for hgb <7 -scds for dvt ppx -PPI bid -GI following; appreciate recs -scds for dvt ppx  HTN Hyperlipidemia Chronic HFrEF Echo 08/19/2022 EF of 40 to 45% with global hypokinesis, normal RV function Plan: -just stopped pressors -as bp improves may consider starting meds for GDMT -daily weights; strict I/O's -statin  Acute metabolic encephalopathy Small acute CVA  -MRI scattered punctate ischemic infarcts right parietal, left frontal, right cerebellum. MRI with contrast 7/4 single punctate enhancement in right frontal parietal, no evidence of mets at this time.  Plan: -neuro following -yesterday patient w/ decreased left sided mvt and delirious -repeat MRI ordered when stable -EEG results pending -neuro checks  -allergic to asa; will need plavix when cleared by GI -statin -PT/OT; SLP when appropriate  Pelvic mass -concerning for gynceologic malignancy; ca 125 elevated 40, CA 19-9 elevated 50 Plan: -OBGYN Oncology following -surgical pathology pending  Best  Practice (right click and "Reselect all SmartList Selections" daily)   Diet/type: NPO w/ meds via tube DVT prophylaxis: SCD GI prophylaxis: PPI Lines: Yes; consider removing later today Foley:  Yes, and it is still needed Code Status:  full code Last date of multidisciplinary goals of care discussion: 7/9 updated patient's son at bedside  Labs   CBC: Recent Labs  Lab 08/23/22 0203 08/23/22 0922 08/23/22 1821 08/24/22 0340 08/24/22 0342 08/25/22 0436  WBC 25.9* 27.3* 34.5* 34.3*  --  37.2*  HGB 8.5* 8.6* 7.9* 7.1* 8.2* 5.8*  HCT 30.2* 30.1* 27.6* 25.2* 24.0* 20.0*  MCV 78.6* 80.9 81.2 78.8*  --  76.9*  PLT 637* 514* 414* 372  --  273     Basic Metabolic Panel: Recent Labs  Lab 08/18/22 0946 08/19/22 0208 08/20/22 0722 08/21/22 0807 08/21/22 1440 08/23/22 0203 08/23/22 0624 08/23/22 0922 08/23/22 1821 08/24/22 0340 08/24/22 0342 08/25/22 0436  NA 137   < >  --  141   < > 139  --  140 137 135 136 135  K 2.8*   < >  --  2.9*   < > 6.0* 6.4* 6.2* 6.4* 5.8* 5.9* 4.5  CL 105   < >  --  110   < > 112*  --  111 110 109  --  105  CO2 21*   < >  --  17*   < > 18*  --  20* 18* 20*  --  23  GLUCOSE 118*   < >  --  90   < > 112*  114* 113* 120* 161*  --  126*  BUN 14   < >  --  16   < > 15  --  17 21 25*  --  31*  CREATININE 0.84   < >  --  0.77   < > 0.94  --  0.98 1.27* 1.32*  --  1.26*  CALCIUM 8.4*   < >  --  8.5*   < > 7.9*  --  8.4* 8.2* 8.1*  --  7.9*  MG 2.0  --  2.2 2.0  --   --   --   --   --  1.7  --   --   PHOS  --   --   --   --   --   --   --   --   --  3.8  --   --    < > = values in this interval not displayed.    GFR: Estimated Creatinine Clearance: 42.7 mL/min (A) (by C-G formula based on SCr of 1.26 mg/dL (H)). Recent Labs  Lab 08/22/22 2052 08/23/22 0203 08/23/22 0624 08/23/22 0922 08/23/22 1821 08/24/22 0340 08/25/22 0436  WBC 25.1* 25.9*  --  27.3* 34.5* 34.3* 37.2*  LATICACIDVEN 1.0 1.2 1.8  --   --   --   --      Liver Function  Tests: Recent Labs  Lab 08/20/22 0321 08/22/22 2052 08/23/22 0922  AST 9* 9* 14*  ALT 9 8 7   ALKPHOS 75 44 35*  BILITOT 0.6 1.9* 1.4*  PROT 6.0* 4.6* 4.6*  ALBUMIN 2.6* 2.7* 2.4*    No results for input(s): "LIPASE", "AMYLASE" in the last 168 hours. No results for input(s): "AMMONIA" in the last 168 hours.  ABG    Component Value Date/Time   PHART 7.322 (L) 08/24/2022 0342   PCO2ART 40.6 08/24/2022 0342   PO2ART 138 (H) 08/24/2022 0342   HCO3 20.8 08/24/2022 0342   TCO2 22 08/24/2022 0342   ACIDBASEDEF 5.0 (H) 08/24/2022 0342   O2SAT 99 08/24/2022 0342     Coagulation Profile: Recent Labs  Lab 08/21/22 0259  INR 1.1     Cardiac Enzymes: No results for input(s): "CKTOTAL", "CKMB", "CKMBINDEX", "TROPONINI" in the last 168 hours.  HbA1C: Hgb A1c MFr Bld  Date/Time Value Ref Range Status  08/20/2022 03:21 AM 4.9 4.8 - 5.6 % Final    Comment:    (NOTE) Pre diabetes:          5.7%-6.4%  Diabetes:              >6.4%  Glycemic control for   <7.0% adults with diabetes     CBG: Recent Labs  Lab 08/24/22 1510 08/24/22 1913 08/24/22 2320 08/25/22 0323 08/25/22 0736  GLUCAP 108* 91 115* 107* 133*     Critical care time: 35 minutes    JD Anselm Lis [facility] 08/25/2022, 10:06 AM  Please see [email or web address] for pager details.  From 7A-7P if no response, please call [phone]. After hours, please call ELink [phone].

## 2022-08-25 NOTE — Progress Notes (Signed)
Initial Nutrition Assessment  DOCUMENTATION CODES:   Non-severe (moderate) malnutrition in context of chronic illness  INTERVENTION:  Trickle tube feeds via NG-tube: Vital 1.5 at 20 mL/hr Once medically appropriate, recommend advancing by 10 mL every 8 hours to goal rate of 50 mL/hr (1200 mL per day) 60 mL ProSource TF20 - Daily Tube feeds at goal provides 1880 kcal, 101 gm protein, and 917 mL free water daily.  Monitor magnesium, potassium, and phosphorus BID for at least 3 days, MD to replete as needed, as pt is at risk for refeeding syndrome given moderate malnutrition and NPO/CLD x 7 days.  Multivitamin w/ minerals daily  If unable to advance TF beyond trickle, recommend starting TPN by LOS day 8.   NUTRITION DIAGNOSIS:   Moderate Malnutrition related to chronic illness as evidenced by mild fat depletion, moderate muscle depletion.  GOAL:   Patient will meet greater than or equal to 90% of their needs  MONITOR:   Diet advancement, Labs, Weight trends, TF tolerance, I & O's  REASON FOR ASSESSMENT:   Consult Enteral/tube feeding initiation and management  ASSESSMENT:   72 y.o. female presented to the ED with weakness, N/V, and abdominal pain. PMH includes COPD, HTN, HLD, GERD, and anxiety. Pt admitted with anemia 2/2 possible upper GI bleed, pelvic mass, and incidental stroke finding.   07/02 - Admitted  07/06 - Op, ex-lap s/p R hemicolectomy, ileostomy creation, mucus fistula formation, and biopsy of pelvic mass; remained intubated 07/08 - extubated; code stroke called 07/09 - NGT placed   Pt sleeping and did not wake to RD voice or touch. Spoke with RN outside of room, pt has not gone to MRI yet. Still plan to start trickles after MRI. No family at bedside. Plan to exchange NGT with Cortrak tomorrow.   Per EMR, pt with a 5.5% weight loss within 4 months, this is not clinically significant for time frame.  Medications reviewed and include: NovoLog SSI, Protonix, IV  antibiotics, Precedex Labs reviewed: Sodium 135, Potassium 4.5, BUN 31, Creatinine 1.26 CBG: 91-133 x 24 hrs  UOP: 490 mL x 24 hrs   NUTRITION - FOCUSED PHYSICAL EXAM:  Flowsheet Row Most Recent Value  Orbital Region Mild depletion  Upper Arm Region No depletion  Thoracic and Lumbar Region No depletion  Buccal Region Mild depletion  Temple Region Mild depletion  Clavicle Bone Region Moderate depletion  Clavicle and Acromion Bone Region Moderate depletion  Scapular Bone Region Moderate depletion  Dorsal Hand Unable to assess  Patellar Region Moderate depletion  Anterior Thigh Region Moderate depletion  Posterior Calf Region Moderate depletion  Edema (RD Assessment) Moderate  Hair Reviewed  Eyes Unable to assess  Mouth Unable to assess  Skin Reviewed  Nails Reviewed   Diet Order:   Diet Order             Diet NPO time specified  Diet effective now                   EDUCATION NEEDS:   Not appropriate for education at this time  Skin:  Skin Assessment: Reviewed RN Assessment  Last BM:  7/8  Height:   Ht Readings from Last 1 Encounters:  08/22/22 5' 5.98" (1.676 m)    Weight:   Wt Readings from Last 1 Encounters:  08/22/22 73.8 kg    Ideal Body Weight:  59.1 kg  BMI:  Body mass index is 26.27 kg/m.  Estimated Nutritional Needs:   Kcal:  1850-2050  Protein:  90-110 grams  Fluid:  >/= 1.8 L   Kirby Crigler RD, LDN Clinical Dietitian See [facility] for contact information.

## 2022-08-25 NOTE — Procedures (Signed)
Patient Name: Shailene Brittingham  MRN: [id number]  Epilepsy Attending: Charlsie Quest  Referring Physician/Provider: Caryl Pina, MD  Date: 08/25/2022 Duration: 26.05 mins  Patient history: 72 y.o. female with left side weakness. EEG to evaluate for seizure.  Level of alertness: Awake  AEDs during EEG study: None  Technical aspects: This EEG study was done with scalp electrodes positioned according to the 10-20 International system of electrode placement. Electrical activity was reviewed with band pass filter of 1-70Hz , sensitivity of 7 uV/mm, display speed of 44mm/sec with a 60Hz  notched filter applied as appropriate. EEG data were recorded continuously and digitally stored.  Video monitoring was available and reviewed as appropriate.  Description: No clear posterior dominant rhythm was seen. EEG showed continuous generalized 3 to 6 Hz theta-delta slowing, at times with triphasic morphology.  Hyperventilation and photic stimulation were not performed.     ABNORMALITY - Continuous slow, generalized  IMPRESSION: This study is suggestive of moderate diffuse encephalopathy, nonspecific etiology but likely related to toxic-metabolic causes. No seizures or definite epileptiform discharges were seen throughout the recording.  Sherrilyn Nairn Annabelle Harman

## 2022-08-26 ENCOUNTER — Inpatient Hospital Stay (HOSPITAL_COMMUNITY): Payer: Medicare Other

## 2022-08-26 DIAGNOSIS — A419 Sepsis, unspecified organism: Secondary | ICD-10-CM | POA: Diagnosis not present

## 2022-08-26 DIAGNOSIS — I6310 Cerebral infarction due to embolism of unspecified precerebral artery: Secondary | ICD-10-CM

## 2022-08-26 DIAGNOSIS — R410 Disorientation, unspecified: Secondary | ICD-10-CM | POA: Diagnosis not present

## 2022-08-26 DIAGNOSIS — G9341 Metabolic encephalopathy: Secondary | ICD-10-CM | POA: Diagnosis not present

## 2022-08-26 LAB — TYPE AND SCREEN
Unit division: 0
Unit division: 0

## 2022-08-26 LAB — CBC
HCT: 28.5 % — ABNORMAL LOW (ref 36.0–46.0)
Hemoglobin: 9 g/dL — ABNORMAL LOW (ref 12.0–15.0)
MCH: 25.4 pg — ABNORMAL LOW (ref 26.0–34.0)
MCHC: 31.6 g/dL (ref 30.0–36.0)
MCV: 80.3 fL (ref 80.0–100.0)
Platelets: 224 10*3/uL (ref 150–400)
RBC: 3.55 MIL/uL — ABNORMAL LOW (ref 3.87–5.11)
RDW: 25 % — ABNORMAL HIGH (ref 11.5–15.5)
WBC: 25.7 10*3/uL — ABNORMAL HIGH (ref 4.0–10.5)
nRBC: 0.3 % — ABNORMAL HIGH (ref 0.0–0.2)

## 2022-08-26 LAB — MAGNESIUM
Magnesium: 2.1 mg/dL (ref 1.7–2.4)
Magnesium: 2.1 mg/dL (ref 1.7–2.4)

## 2022-08-26 LAB — GLUCOSE, CAPILLARY
Glucose-Capillary: 106 mg/dL — ABNORMAL HIGH (ref 70–99)
Glucose-Capillary: 121 mg/dL — ABNORMAL HIGH (ref 70–99)
Glucose-Capillary: 75 mg/dL (ref 70–99)
Glucose-Capillary: 81 mg/dL (ref 70–99)
Glucose-Capillary: 85 mg/dL (ref 70–99)
Glucose-Capillary: 96 mg/dL (ref 70–99)
Glucose-Capillary: 98 mg/dL (ref 70–99)

## 2022-08-26 LAB — BASIC METABOLIC PANEL
Anion gap: 10 (ref 5–15)
BUN: 34 mg/dL — ABNORMAL HIGH (ref 8–23)
CO2: 23 mmol/L (ref 22–32)
Calcium: 8.2 mg/dL — ABNORMAL LOW (ref 8.9–10.3)
Chloride: 105 mmol/L (ref 98–111)
Creatinine, Ser: 1.17 mg/dL — ABNORMAL HIGH (ref 0.44–1.00)
GFR, Estimated: 50 mL/min — ABNORMAL LOW (ref >60)
Glucose, Bld: 96 mg/dL (ref 70–99)
Potassium: 4.1 mmol/L (ref 3.5–5.1)
Sodium: 138 mmol/L (ref 135–145)

## 2022-08-26 LAB — BPAM RBC
ISSUE DATE / TIME: 202407090557
ISSUE DATE / TIME: 202407091209
Unit Type and Rh: 6200
Unit Type and Rh: 6200

## 2022-08-26 LAB — PHOSPHORUS
Phosphorus: 3.5 mg/dL (ref 2.5–4.6)
Phosphorus: 2.9 mg/dL (ref 2.5–4.6)

## 2022-08-26 LAB — TRIGLYCERIDES: Triglycerides: 94 mg/dL (ref <150)

## 2022-08-26 MED ORDER — IOHEXOL 350 MG/ML SOLN
75.0000 mL | Freq: Once | INTRAVENOUS | Status: AC | PRN
  Administered 2022-08-26: 75 mL via INTRAVENOUS

## 2022-08-26 MED ORDER — CARMEX CLASSIC LIP BALM EX OINT: TOPICAL_OINTMENT | CUTANEOUS | Status: DC | PRN

## 2022-08-26 MED ORDER — THIAMINE MONONITRATE 100 MG PO TABS
100.0000 mg | ORAL_TABLET | Freq: Every day | ORAL | Status: AC
  Administered 2022-08-26 – 2022-08-30 (×5): 100 mg
  Filled 2022-08-26 (×5): qty 1

## 2022-08-26 MED ORDER — VITAL 1.5 CAL PO LIQD
1000.0000 mL | ORAL | Status: DC
  Administered 2022-08-26 – 2022-09-02 (×5): 1000 mL
  Filled 2022-08-26 (×12): qty 1000

## 2022-08-26 NOTE — Evaluation (Signed)
Occupational Therapy Evaluation Patient Details Name: Debra Sharp MRN: [id number] DOB: 1951-01-02 Today's Date: 08/26/2022   History of Present Illness 72 yo female admitted 7/2 with generalized weakness, thrombocytosis and anemia. 7/2 MRI scattered punctate ischemic infarcts right parietal, left frontal, right cerebellum. Pt with progression to AKI with septic peritonitis. 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 7/8 extubated. Lt sided weakness noted post extubation with CT demonstrating possible Rt parietal infarct. PMHx: COPD, anemia, CHF, HLD, HTN   Clinical Impression   PTA, per son, pt lived with son and received light HHA for ambulatory transfers but able to perform ADL with supervision. Upon eval, pt lethargic, confused, with decreased awareness, initiation, strength, balance. Pt inconsistently able to answer basic questions but questionable historian. Oriented to self, location, and year provided multiple choice options. Pt able to squeeze therapist's hands bilaterally with weak grasp (L weaker than R), but unable to move arms against gravity. Pt to continue to benefit from acute OT to optimize strength, and functional participation in ADL. Patient will benefit from continued inpatient follow up therapy, <3 hours/day     Recommendations for follow up therapy are one component of a multi-disciplinary discharge planning process, led by the attending physician.  Recommendations may be updated based on patient status, additional functional criteria and insurance authorization.   Assistance Recommended at Discharge Frequent or constant Supervision/Assistance  Patient can return home with the following Two people to help with walking and/or transfers;Two people to help with bathing/dressing/bathroom;Assistance with cooking/housework;Direct supervision/assist for medications management;Direct supervision/assist for  financial management;Assist for transportation;Help with stairs or ramp for entrance    Functional Status Assessment  Patient has had a recent decline in their functional status and demonstrates the ability to make significant improvements in function in a reasonable and predictable amount of time.  Equipment Recommendations  Other (comment) (defer to next venue)    Recommendations for Other Services       Precautions / Restrictions Precautions Precautions: Fall;Other (comment) Precaution Comments: cortrak, ostomy, Rt fem line Restrictions Weight Bearing Restrictions: No      Mobility Bed Mobility Overal bed mobility: Needs Assistance Bed Mobility: Rolling, Supine to Sit, Sit to Supine Rolling: Total assist   Supine to sit: Total assist Sit to supine: Total assist   General bed mobility comments: total assist to roll with +2 for safety and positioning. Use of chair egress function to transition supine<>sit . Pt total +2 assist for anterior translation of trunk in sitting. Total +2 to slide toward [facility]    Transfers                   General transfer comment: unable      Balance Overall balance assessment: Needs assistance   Sitting balance-Leahy Scale: Zero                                     ADL either performed or assessed with clinical judgement   ADL Overall ADL's : Needs assistance/impaired Eating/Feeding: NPO   Grooming: Maximal assistance;Wash/dry face;Bed level Grooming Details (indicate cue type and reason): Pt holding therapist's wrist Upper Body Bathing: Total assistance   Lower Body Bathing: Total assistance   Upper Body Dressing : Total assistance   Lower Body Dressing: Total assistance;+2 for safety/equipment;+2 for physical assistance   Toilet Transfer: Total assistance;+2 for physical assistance;+2 for safety/equipment  Toileting- Clothing Manipulation and Hygiene: Total assistance;+2 for physical assistance;+2 for  safety/equipment       Functional mobility during ADLs: Total assistance;+2 for physical assistance;+2 for safety/equipment       Vision Patient Visual Report: No change from baseline (Pt reporting "I see fine", however, not opening eyes during session) Vision Assessment?: Vision impaired- to be further tested in functional context Additional Comments: Pt refusing to open eyes during session, not opening to stimuli and RN repporting she had to hold pt's eyes open to do pupil exam. OT did not manually open this session for rapport building purposes     Perception     Praxis      Pertinent Vitals/Pain Pain Assessment Pain Assessment: CPOT Facial Expression: Tense Body Movements: Absence of movements Muscle Tension: Relaxed Compliance with ventilator (intubated pts.): N/A Vocalization (extubated pts.): Crying out, sobbing CPOT Total: 3 Pain Intervention(s): Limited activity within patient's tolerance, Monitored during session     Hand Dominance     Extremity/Trunk Assessment Upper Extremity Assessment Upper Extremity Assessment: RUE deficits/detail;LUE deficits/detail RUE Deficits / Details: squeezing hand 3+/5 on command, but no active elbow or shoulder flexion nor attempt to catch when dropped. Lethargic throughout may be affecting. PROM WFL. mild edema in hand noted LUE Deficits / Details: Pt reporting she can feel touch. Able to very lightly grasp therapist's hand <3/5. PROM WFL. Mild edema noted.   Lower Extremity Assessment Lower Extremity Assessment: Defer to PT evaluation   Cervical / Trunk Assessment Cervical / Trunk Assessment: Kyphotic   Communication Communication Communication: Expressive difficulties (slow processing and incresed time for all verbal responses)   Cognition Arousal/Alertness: Lethargic Behavior During Therapy: Restless, Flat affect (intermittently calling out "help") Overall Cognitive Status: Impaired/Different from baseline Area of  Impairment: Attention, Memory, Following commands, Safety/judgement, Awareness, Problem solving                   Current Attention Level: Focused Memory: Decreased short-term memory Following Commands: Follows one step commands inconsistently, Follows one step commands with increased time Safety/Judgement: Decreased awareness of safety, Decreased awareness of deficits Awareness: Intellectual Problem Solving: Slow processing, Decreased initiation, Difficulty sequencing, Requires verbal cues, Requires tactile cues General Comments: pt with eyes closed throughout session with frequent moaning/yelling out with. Limited intentional movement. Following very basic commands "squeeze my hand", refusing to follow some commands:"open your eyes", however, blatantly poor reasoning; reporting her mouth is dry even when asked to lift her arm. Speaking in 1-2 word statements. Poor historian. Pt son reports she lives with him, pt reports she lives alone.     General Comments  VSS supine and with HOB elevated    Exercises     Shoulder Instructions      Home Living Family/patient expects to be discharged to:: Private residence Living Arrangements: Alone Available Help at Discharge: Family (son works from home) Type of Home: House Home Access: Stairs to enter Secretary/administrator of Steps: 1 Entrance Stairs-Rails: Right Home Layout: One level     Bathroom Shower/Tub: Tub/shower unit;Walk-in shower (Pt uses walk in)   [employer]: Standard     Home Equipment: Rollator (4 wheels);Cane - single point;Shower seat   Additional Comments: Above information gleaned from son via phone.      Prior Functioning/Environment Prior Level of Function : Independent/Modified Independent;Needs assist             Mobility Comments: son has been providing hand held assist for walking within the home since March per his  report ADLs Comments: Son reports HHA for ambulation, but pt able to perform  BADL once she arrives at destination.        OT Problem List: Decreased strength;Decreased activity tolerance;Impaired balance (sitting and/or standing);Decreased cognition;Decreased coordination;Decreased safety awareness;Decreased knowledge of use of DME or AE;Impaired UE functional use      OT Treatment/Interventions: Self-care/ADL training;Therapeutic exercise;DME and/or AE instruction;Balance training;Patient/family education;Cognitive remediation/compensation;Therapeutic activities;Visual/perceptual remediation/compensation    OT Goals(Current goals can be found in the care plan section) Acute Rehab OT Goals Patient Stated Goal: son states return to PLOF OT Goal Formulation: With family Time For Goal Achievement: 09/09/22 Potential to Achieve Goals: Fair  OT Frequency: Min 2X/week    Co-evaluation              AM-PAC OT "6 Clicks" Daily Activity     Outcome Measure Help from another person eating meals?: Total Help from another person taking care of personal grooming?: Total Help from another person toileting, which includes using toliet, bedpan, or urinal?: Total Help from another person bathing (including washing, rinsing, drying)?: Total Help from another person to put on and taking off regular upper body clothing?: Total Help from another person to put on and taking off regular lower body clothing?: Total 6 Click Score: 6   End of Session Nurse Communication: Mobility status  Activity Tolerance: Patient limited by lethargy Patient left: in bed;with call bell/phone within reach;with bed alarm set  OT Visit Diagnosis: Unsteadiness on feet (R26.81);Muscle weakness (generalized) (M62.81);Other symptoms and signs involving cognitive function                Time: 0981-1914 OT Time Calculation (min): 25 min Charges:  OT General Charges $OT Visit: 1 Visit OT Evaluation $OT Eval Moderate Complexity: 1 Mod OT Treatments $Self Care/Home Management : 8-22 mins  Tyler Deis, OTR/L [facility] Acute Rehabilitation Office: [phone]   Myrla Halsted 08/26/2022, 1:34 PM

## 2022-08-26 NOTE — Progress Notes (Signed)
Brief Nutrition Support Note  Discussed pt with RN and during ICU rounds. Discussed pt with PCCM and Surgery PA. Pt currently with Vital 1.5 tube feeds infusing at 20 ml/hr via NG tube. Pt is having output from ileostomy. Abdomen is not distended. Potassium, phosphorus, and magnesium are all WNL. Pt is off pressors. Per discussion with providers, okay to advance tube feeds slowly to goal rate. Consult received for enteral nutrition initiation and management.  Plan to exchange NG tube/clip for Cortrak/bridle today.  RD will adjust tube feeding orders: - Increase Vital 1.5 to 30 ml/hr at 1200 today. Continue to advance rate by 10 ml every 8 hours to goal rate of 50 ml/hr (1200 ml/day). - Continue PROSource TF20 60 ml daily  Tube feeding regimen at goal rate provides 1880 kcal, 101 grams of protein, and 917 ml of H2O.   Continue to monitor magnesium, potassium, and phosphorus every 12 hours for at least 6 occurrences. MD to replete as needed as pt is at risk for refeeding syndrome.  - Continue MVI with minerals daily. Add thiamine 100 mg daily per tube x 5 days due to refeeding risk  Labs and medications reviewed. RD will continue to follow pt during admission.   Mertie Clause, MS, RD, LDN Inpatient Clinical Dietitian Please see AMiON for contact information.

## 2022-08-26 NOTE — Progress Notes (Signed)
[facility] Progress Note  4 Days Post-Op  Subjective: CC:  Just returned from MRI. No family at bedside Saying "help me"  Remains off levo Tolerating trickle TF so far.  Objective: Vital signs in last 24 hours: Temp:  [97 F (36.1 C)-98.2 F (36.8 C)] 97.6 F (36.4 C) (07/10 0729) Pulse Rate:  [53-213] 62 (07/10 0732) Resp:  [7-14] 12 (07/10 0732) BP: (89-132)/(44-71) 107/46 (07/10 0700) SpO2:  [92 %-100 %] 95 % (07/10 0732) Arterial Line BP: (93-170)/(42-73) 129/47 (07/10 0700) Weight:  [79.4 kg] 79.4 kg (07/10 0448) Last BM Date : 08/24/22  Intake/Output from previous day: 07/09 0701 - 07/10 0700 In: 1694.7 [I.V.:467.6; Blood:688.7; NG/GT:133.3; IV Piggyback:405.1] Out: 1450 [Urine:1200; Drains:150; Stool:100] Intake/Output this shift: No intake/output data recorded.  PE: General: WD,  Heart: RRR, HR 60's Lungs: normal effort  Abd: soft, ileostomy viable, mucus fistula viable, midline wound clean with intact fascia and some fat necrosis - stable.             Ileostomy: nonbloody stool             Mucous fistula: productive of stool.              Blake drain LLQ (terminates in R paracolic gutter): JP 150 mL cloudy SS drainage, some brown sediment GU: foley present - UOP improved. >1200 mL    Lab Results:  Recent Labs    08/25/22 0436 08/25/22 1054 08/25/22 1728 08/26/22 0512  WBC 37.2*  --   --  25.7*  HGB 5.8*   < > 8.9* 9.0*  HCT 20.0*   < > 27.8* 28.5*  PLT 273  --   --  224   < > = values in this interval not displayed.   BMET Recent Labs    08/25/22 0436 08/26/22 0512  NA 135 138  K 4.5 4.1  CL 105 105  CO2 23 23  GLUCOSE 126* 96  BUN 31* 34*  CREATININE 1.26* 1.17*  CALCIUM 7.9* 8.2*   PT/INR No results for input(s): "LABPROT", "INR" in the last 72 hours. CMP     Component Value Date/Time   NA 138 08/26/2022 0512   K 4.1 08/26/2022 0512   CL 105 08/26/2022 0512   CO2 23 08/26/2022 0512   GLUCOSE 96 08/26/2022 0512    BUN 34 (H) 08/26/2022 0512   CREATININE 1.17 (H) 08/26/2022 0512   CALCIUM 8.2 (L) 08/26/2022 0512   PROT 4.6 (L) 08/23/2022 0922   ALBUMIN 2.4 (L) 08/23/2022 0922   AST 14 (L) 08/23/2022 0922   ALT 7 08/23/2022 0922   ALKPHOS 35 (L) 08/23/2022 0922   BILITOT 1.4 (H) 08/23/2022 0922   GFRNONAA 50 (L) 08/26/2022 0512   GFRAA >60 05/11/2015 0231   Lipase  No results found for: "LIPASE"     Studies/Results: DG Abd 1 View  Result Date: 08/25/2022 CLINICAL DATA:  [id number] Encounter for imaging study to confirm orogastric (OG) tube placement [id number] EXAM: ABDOMEN - 1 VIEW COMPARISON:  08/22/2022 FINDINGS: Enteric tube terminates within the gastric antrum. There is a small amount of contrast within the gastric fundus. Included bowel gas pattern is nonobstructive. Overlying monitoring leads. IMPRESSION: Enteric tube terminates within the gastric antrum. Electronically Signed   By: Duanne Guess D.O.   On: 08/25/2022 10:54   EEG adult  Result Date: 08/25/2022 Charlsie Quest, MD     08/25/2022  9:09 AM Patient Name: Debra Sharp MRN: [id number] Epilepsy Attending: Charlsie Quest  Referring Physician/Provider: Caryl Pina, MD Date: 08/25/2022 Duration: 26.05 mins Patient history: 72 y.o. female with left side weakness. EEG to evaluate for seizure. Level of alertness: Awake AEDs during EEG study: None Technical aspects: This EEG study was done with scalp electrodes positioned according to the 10-20 International system of electrode placement. Electrical activity was reviewed with band pass filter of 1-70Hz , sensitivity of 7 uV/mm, display speed of 74mm/sec with a 60Hz  notched filter applied as appropriate. EEG data were recorded continuously and digitally stored.  Video monitoring was available and reviewed as appropriate. Description: No clear posterior dominant rhythm was seen. EEG showed continuous generalized 3 to 6 Hz theta-delta slowing, at times with triphasic morphology.  Hyperventilation and  photic stimulation were not performed.   ABNORMALITY - Continuous slow, generalized IMPRESSION: This study is suggestive of moderate diffuse encephalopathy, nonspecific etiology but likely related to toxic-metabolic causes. No seizures or definite epileptiform discharges were seen throughout the recording. [name]   CT HEAD WO CONTRAST ( )  Result Date: 08/24/2022 CLINICAL DATA:  Neuro deficit, acute, stroke suspected New left sided weakness. EXAM: CT HEAD WITHOUT CONTRAST TECHNIQUE: Contiguous axial images were obtained from the base of the skull through the vertex without intravenous contrast. RADIATION DOSE REDUCTION: This exam was performed according to the departmental dose-optimization program which includes automated exposure control, adjustment of the mA and/or kV according to patient size and/or use of iterative reconstruction technique. COMPARISON:  08/18/2022 FINDINGS: Brain: There is questionable loss of gray-white differentiation in the high right parietal lobe on images 20 6-28. Cannot exclude acute infarct. The previously seen areas of abnormal punctate diffusion signal on prior MRI not appreciable by CT. There is atrophy and chronic small vessel disease changes. No hydrocephalus or hemorrhage. Vascular: No hyperdense vessel or unexpected calcification. Skull: No acute calvarial abnormality. Sinuses/Orbits: Mucosal thickening diffusely, most pronounced in the left frontal sinus, stable. No air-fluid levels. Other: None IMPRESSION: Questionable area of loss of gray-white differentiation in the high right parietal lobe concerning for area of acute infarction. This could be further evaluated with MRI. Atrophy, chronic small vessel disease. Chronic sinusitis. Electronically Signed   By: Charlett Nose M.D.   On: 08/24/2022 18:06    Anti-infectives: Anti-infectives (From admission, onward)    Start     Dose/Rate Route Frequency Ordered Stop   08/23/22 2100  ceFEPIme (MAXIPIME) 2 g in  sodium chloride 0.9 % 100 mL IVPB        2 g 200 mL/hr over 30 Minutes Intravenous Every 12 hours 08/23/22 1357     08/23/22 0400  micafungin (MYCAMINE) 100 mg in sodium chloride 0.9 % 100 mL IVPB        100 mg 105 mL/hr over 1 Hours Intravenous Daily 08/23/22 0242     08/22/22 1800  metroNIDAZOLE (FLAGYL) IVPB 500 mg        500 mg 100 mL/hr over 60 Minutes Intravenous Every 12 hours 08/22/22 1711     08/22/22 1800  ceFEPIme (MAXIPIME) 2 g in sodium chloride 0.9 % 100 mL IVPB  Status:  Discontinued        2 g 200 mL/hr over 30 Minutes Intravenous Every 8 hours 08/22/22 1716 08/23/22 1357   08/22/22 1724  metroNIDAZOLE (FLAGYL) 500 MG/100ML IVPB       Note to Pharmacy: [name]: cabinet override      08/22/22 1724 08/23/22 0845   08/19/22 1030  cefTRIAXone (ROCEPHIN) 1 g in sodium chloride 0.9 % 100 mL  IVPB  Status:  Discontinued        1 g 200 mL/hr over 30 Minutes Intravenous Every 24 hours 08/19/22 0937 08/22/22 1715        Assessment/Plan  Septic shock VDRF, post-op Colonic perforation secondary to obstructing pelvic mass  POD3 S/P exploratory laparotomy, right hemicolectomy, ileostomy, mucus fistula, biopsy of pelvic mass Dr. 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 - BID wet to dry to midline - appreciate CCM management    FEN: NPO, TF @ 20 mL/hr ok to slowly advance to goal as tolerated. VTE: ok  for LMWH from CCS standpoint ID: cefepime/flagyl/micafungin  Foley: in place for strict I&Os, low UOP   MRI today, will follow results  LOS: 8 days   I reviewed nursing notes, hospitalist notes, last 24 h vitals and pain scores, last 48 h intake and output, last 24 h labs and trends, and last 24 h imaging results.    Hosie Spangle, PA-C [facility] Please see Amion for pager number during day hours 7:00am-4:30pm

## 2022-08-26 NOTE — Progress Notes (Signed)
[facility] Progress Note  4 Days Post-Op  Subjective: CC:  Encephalopathic. Not moving left side. No reported changes with ostomy/drain.   Objective: Vital signs in last 24 hours: Temp:  [97 F (36.1 C)-98.2 F (36.8 C)] 97.6 F (36.4 C) (07/10 0729) Pulse Rate:  [53-213] 62 (07/10 0732) Resp:  [7-14] 12 (07/10 0732) BP: (89-132)/(44-71) 107/46 (07/10 0700) SpO2:  [92 %-100 %] 95 % (07/10 0732) Arterial Line BP: (93-170)/(42-73) 129/47 (07/10 0700) Weight:  [79.4 kg] 79.4 kg (07/10 0448) Last BM Date : 08/24/22  Intake/Output from previous day: 07/09 0701 - 07/10 0700 In: 1694.7 [I.V.:467.6; Blood:688.7; NG/GT:133.3; IV Piggyback:405.1] Out: 1450 [Urine:1200; Drains:150; Stool:100] Intake/Output this shift: No intake/output data recorded.  PE: General: WD, ill appearing female who is sedated on the vent Heart: sinus tachycardia.  Palpable pedal pulses bilaterally Lungs: vented Abd: soft, ileostomy viable, mucus fistula viable, JP with feculent appearing fluid - high volume, midline wound clean with intact fascia and some fat necrosis.             Ileostomy: nonbloody stool             Mucous fistula: productive of stool.              Blake drain LLQ (terminates in R paracolic gutter): 1,025 mL, blake drain exchanged for hemovac : SS drainage in tubing, still with some brown sediment GU: foley present     Lab Results:  Recent Labs    08/25/22 0436 08/25/22 1054 08/25/22 1728 08/26/22 0512  WBC 37.2*  --   --  25.7*  HGB 5.8*   < > 8.9* 9.0*  HCT 20.0*   < > 27.8* 28.5*  PLT 273  --   --  224   < > = values in this interval not displayed.   BMET Recent Labs    08/25/22 0436 08/26/22 0512  NA 135 138  K 4.5 4.1  CL 105 105  CO2 23 23  GLUCOSE 126* 96  BUN 31* 34*  CREATININE 1.26* 1.17*  CALCIUM 7.9* 8.2*   PT/INR No results for input(s): "LABPROT", "INR" in the last 72 hours. CMP     Component Value Date/Time   NA 138 08/26/2022 0512    K 4.1 08/26/2022 0512   CL 105 08/26/2022 0512   CO2 23 08/26/2022 0512   GLUCOSE 96 08/26/2022 0512   BUN 34 (H) 08/26/2022 0512   CREATININE 1.17 (H) 08/26/2022 0512   CALCIUM 8.2 (L) 08/26/2022 0512   PROT 4.6 (L) 08/23/2022 0922   ALBUMIN 2.4 (L) 08/23/2022 0922   AST 14 (L) 08/23/2022 0922   ALT 7 08/23/2022 0922   ALKPHOS 35 (L) 08/23/2022 0922   BILITOT 1.4 (H) 08/23/2022 0922   GFRNONAA 50 (L) 08/26/2022 0512   GFRAA >60 05/11/2015 0231   Lipase  No results found for: "LIPASE"     Studies/Results: DG Abd 1 View  Result Date: 08/25/2022 CLINICAL DATA:  [id number] Encounter for imaging study to confirm orogastric (OG) tube placement [id number] EXAM: ABDOMEN - 1 VIEW COMPARISON:  08/22/2022 FINDINGS: Enteric tube terminates within the gastric antrum. There is a small amount of contrast within the gastric fundus. Included bowel gas pattern is nonobstructive. Overlying monitoring leads. IMPRESSION: Enteric tube terminates within the gastric antrum. Electronically Signed   By: Duanne Guess D.O.   On: 08/25/2022 10:54   EEG adult  Result Date: 08/25/2022 Debra Quest, MD     08/25/2022  9:09 AM  Patient Name: Debra Sharp MRN: [id number] Epilepsy Attending: Charlsie Sharp Referring Physician/Provider: Caryl Pina, MD Date: 08/25/2022 Duration: 26.05 mins Patient history: 72 y.o. female with left side weakness. EEG to evaluate for seizure. Level of alertness: Awake AEDs during EEG study: None Technical aspects: This EEG study was done with scalp electrodes positioned according to the 10-20 International system of electrode placement. Electrical activity was reviewed with band pass filter of 1-70Hz , sensitivity of 7 uV/mm, display speed of 67mm/sec with a 60Hz  notched filter applied as appropriate. EEG data were recorded continuously and digitally stored.  Video monitoring was available and reviewed as appropriate. Description: No clear posterior dominant rhythm was seen. EEG showed  continuous generalized 3 to 6 Hz theta-delta slowing, at times with triphasic morphology.  Hyperventilation and photic stimulation were not performed.   ABNORMALITY - Continuous slow, generalized IMPRESSION: This study is suggestive of moderate diffuse encephalopathy, nonspecific etiology but likely related to toxic-metabolic causes. No seizures or definite epileptiform discharges were seen throughout the recording. Debra Sharp   CT HEAD WO CONTRAST ( )  Result Date: 08/24/2022 CLINICAL DATA:  Neuro deficit, acute, stroke suspected New left sided weakness. EXAM: CT HEAD WITHOUT CONTRAST TECHNIQUE: Contiguous axial images were obtained from the base of the skull through the vertex without intravenous contrast. RADIATION DOSE REDUCTION: This exam was performed according to the departmental dose-optimization program which includes automated exposure control, adjustment of the mA and/or kV according to patient size and/or use of iterative reconstruction technique. COMPARISON:  08/18/2022 FINDINGS: Brain: There is questionable loss of gray-white differentiation in the high right parietal lobe on images 20 6-28. Cannot exclude acute infarct. The previously seen areas of abnormal punctate diffusion signal on prior MRI not appreciable by CT. There is atrophy and chronic small vessel disease changes. No hydrocephalus or hemorrhage. Vascular: No hyperdense vessel or unexpected calcification. Skull: No acute calvarial abnormality. Sinuses/Orbits: Mucosal thickening diffusely, most pronounced in the left frontal sinus, stable. No air-fluid levels. Other: None IMPRESSION: Questionable area of loss of gray-white differentiation in the high right parietal lobe concerning for area of acute infarction. This could be further evaluated with MRI. Atrophy, chronic small vessel disease. Chronic sinusitis. Electronically Signed   By: Debra Sharp M.D.   On: 08/24/2022 18:06    Anti-infectives: Anti-infectives (From admission,  onward)    Start     Dose/Rate Route Frequency Ordered Stop   08/23/22 2100  ceFEPIme (MAXIPIME) 2 g in sodium chloride 0.9 % 100 mL IVPB        2 g 200 mL/hr over 30 Minutes Intravenous Every 12 hours 08/23/22 1357     08/23/22 0400  micafungin (MYCAMINE) 100 mg in sodium chloride 0.9 % 100 mL IVPB        100 mg 105 mL/hr over 1 Hours Intravenous Daily 08/23/22 0242     08/22/22 1800  metroNIDAZOLE (FLAGYL) IVPB 500 mg        500 mg 100 mL/hr over 60 Minutes Intravenous Every 12 hours 08/22/22 1711     08/22/22 1800  ceFEPIme (MAXIPIME) 2 g in sodium chloride 0.9 % 100 mL IVPB  Status:  Discontinued        2 g 200 mL/hr over 30 Minutes Intravenous Every 8 hours 08/22/22 1716 08/23/22 1357   08/22/22 1724  metroNIDAZOLE (FLAGYL) 500 MG/100ML IVPB       Note to Pharmacy: [name]: cabinet override      08/22/22 1724 08/23/22 0845   08/19/22 1030  cefTRIAXone (ROCEPHIN) 1 g in sodium chloride 0.9 % 100 mL IVPB  Status:  Discontinued        1 g 200 mL/hr over 30 Minutes Intravenous Every 24 hours 08/19/22 0937 08/22/22 1715        Assessment/Plan  Septic shock VDRF, post-op Colonic perforation secondary to obstructing pelvic mass  POD3 S/P exploratory laparotomy, right hemicolectomy, ileostomy, mucus fistula, biopsy of pelvic mass Dr. 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 - BID wet to dry to midline - weaning from vent. Ok for trickle TF - appreciate CCM management    FEN: NPO, ok for TF VTE: ok to have SQH from surgery standpoint  ID: cefepime/flagyl/micafungin  Foley: in place for strict I&Os, low UOP   MRI today, will follow results  LOS: 8 days   I reviewed nursing notes,  hospitalist notes, last 24 h vitals and pain scores, last 48 h intake and output, last 24 h labs and trends, and last 24 h imaging results.    Hosie Spangle, PA-C [facility] Please see Amion for pager number during day hours 7:00am-4:30pm

## 2022-08-26 NOTE — Procedures (Signed)
Cortrak  Tube Type:  Cortrak - 43 inches Tube Location:  Left nare Initial Placement:  Stomach Secured by: Bridle Technique Used to Measure Tube Placement:  Marking at nare/corner of mouth Cortrak Secured At:  66 cm   Cortrak Tube Team Note:  Consult received to place a Cortrak feeding tube.   X-ray is required, abdominal x-ray has been ordered by the Cortrak team. Please confirm tube placement before using the Cortrak tube.   If the tube becomes dislodged please keep the tube and contact the Cortrak team at [email or web address] for replacement.  If after hours and replacement cannot be delayed, place a NG tube and confirm placement with an abdominal x-ray.   Macaiah Mangal MS, RD, LDN Please refer to AMION for RD and/or RD on-call/weekend/after hours pager   

## 2022-08-26 NOTE — TOC Progression Note (Signed)
Transition of Care [facility]) - Initial/Assessment Note    Patient Details  Name: Debra Sharp MRN: [id number] Date of Birth: 1950-08-26  [facility]) CM/SW Contact:    Ralene Bathe, LCSW Phone Number: 08/26/2022, 3:38 PM  Clinical Narrative:                 LCSW received consult for [facility] and contacted the patient's son to present choice.  The son's facility of choice is [facility]  Select Liaison informed and will follow up with family when patient is closer to being medically ready.   TOC following.   Expected Discharge Plan: Home/Self Care Barriers to Discharge: Continued Medical Work up   Patient Goals and CMS Choice            Expected Discharge Plan and Services       Living arrangements for the past 2 months: Single Family Home                                      Prior Living Arrangements/Services Living arrangements for the past 2 months: Single Family Home Lives with:: Self Patient language and need for interpreter reviewed:: Yes Do you feel safe going back to the place where you live?: Yes          Current home services: DME (walker/ cane/ shower seat) Criminal Activity/Legal Involvement Pertinent to Current Situation/Hospitalization: No - Comment as needed  Activities of Daily Living Home Assistive Devices/Equipment: None ADL Screening (condition at time of admission) Patient's cognitive ability adequate to safely complete daily activities?: Yes Is the patient deaf or have difficulty hearing?: No Does the patient have difficulty seeing, even when wearing glasses/contacts?: No Does the patient have difficulty concentrating, remembering, or making decisions?: No Patient able to express need for assistance with ADLs?: Yes Does the patient have difficulty dressing or bathing?: No Independently performs ADLs?: Yes (appropriate for developmental age) Does the patient have difficulty walking or climbing stairs?:  No Weakness of Legs: Both Weakness of Arms/Hands: None  Permission Sought/Granted                  Emotional Assessment Appearance:: Appears stated age Attitude/Demeanor/Rapport: Engaged Affect (typically observed): Accepting Orientation: : Oriented to Self, Oriented to Place, Oriented to Situation, Oriented to  Time   Psych Involvement: No (comment)  Admission diagnosis:  GI bleed [K92.2] Patient Active Problem List   Diagnosis Date Noted   Encephalopathy acute 08/25/2022   Perforation bowel (HCC) 08/25/2022   Malnutrition of moderate degree 08/25/2022   Pelvic mass 08/23/2022   Change in bowel habits 08/23/2022   Abnormal CT scan, gastrointestinal tract 08/23/2022   Arterial hypotension 08/23/2022   Septic shock (HCC) 08/23/2022   Fecal occult blood test positive 08/23/2022   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/2022   Hydroureteronephrosis 08/23/2022   Sepsis with acute hypoxic respiratory failure and septic shock (HCC) 08/23/2022   Bowel perforation (HCC) 08/22/2022   Ventilator dependent (HCC) 08/22/2022   Abnormal CT of the abdomen 08/20/2022   Colon distention 08/20/2022   History of cardioembolic cerebrovascular accident (CVA) 08/20/2022   Silent cerebral infarction (HCC) 08/20/2022   GI bleed 08/18/2022   Iron deficiency anemia 04/25/2022   Epistaxis 04/25/2022   CHF (congestive heart failure), NYHA class I, acute, diastolic (HCC) 04/24/2022   Symptomatic anemia 04/24/2022   Thrombocytosis 04/24/2022   Elevated troponin 04/24/2022   Heart failure (HCC)  04/24/2022   LATERAL EPICONDYLITIS, LEFT 04/10/2010   PANIC DISORDER 10/25/2009   DEPRESSION 10/25/2009   COUGH 10/25/2009   SHOULDER PAIN, RIGHT 03/08/2009   Other specified chronic obstructive pulmonary disease 11/30/2008   WEIGHT GAIN 11/30/2008   CATARACT NOS 10/25/2006   Essential hypertension 10/25/2006   ALLERGIC RHINITIS 10/25/2006   ASTHMA 10/25/2006   DERMATOGRAPHIA 10/25/2006   CATARACT EXTRACTION, HX  OF 10/25/2006   ARTHROSCOPY, KNEE, HX OF 10/25/2006   PCP:  Clayborn Heron, MD Pharmacy:   [facility] [address] [place] - [address] [place] [address] Phone: [phone] Fax: [phone]  [facility] [address] [place] [address] Phone: [phone] Fax: [phone]     Social Determinants of Health (SDOH) Social History: SDOH Screenings   Food Insecurity: No Food Insecurity (08/19/2022)  Housing: Low Risk  (08/19/2022)  Transportation Needs: No Transportation Needs (08/19/2022)  Utilities: Not At Risk (08/19/2022)  Tobacco Use: Medium Risk (08/24/2022)   SDOH Interventions:     Readmission Risk Interventions     No data to display

## 2022-08-26 NOTE — Plan of Care (Signed)
MRI brain reveals acute and late acute infarcts in multiple vascular territories, consistent with central embolic source. No acute hemorrhage or significant mass effect.  TTE on 7/3 revealed LVEF of 40 to 45% with mildly decreased function and global hypokinesis. No mural thrombus or valvular vegetation was mentioned in the report.   A/R: 72 year old female with multiple acute and subacute strokes in multiple vascular territories, suggestive of cardiac source - Will need TEE and CTA of head and neck - Cardiac telemetry - PT/OT/Speech - Continue atorvastatin - ASA should be started if she is no longer at risk for bleeding s/p resection of her colon perforation. Otherwise, ASA to be started at a later date when safe, per primary team.  - Modified permissive HTN protocol given that she is elderly and frail. Treat with antihypertensive if SBP > 180.  - Stroke Team to follow  Electronically signed: Dr. Caryl Pina

## 2022-08-26 NOTE — Progress Notes (Signed)
Pharmacy Antibiotic Note  Debra Sharp is a 72 y.o. female admitted on 08/18/2022 with septic shock/colonic perforation secondary to obstructive pelvis mass, UTI. Pharmacy has been consulted for Cefepime dosing. Pt also on Flagyl and Micafungin. SCr up a bit to 0.98.  Plan: Continue cefepime 2g IV Q12H Flagyl 500mg  IV Q12H and Micafungin 100mg  IV Q24H per MD Monitor clinical progress, c/s, renal function, abx LOT  Height: 5' 5.98" (167.6 cm) Weight: 79.4 kg (175 lb 0.7 oz) IBW/kg (Calculated) : 59.26  Temp (24hrs), Avg:97.7 F (36.5 C), Min:97 F (36.1 C), Max:98.2 F (36.8 C)  Recent Labs  Lab 08/22/22 2052 08/23/22 0203 08/23/22 0624 08/23/22 0922 08/23/22 1821 08/24/22 0340 08/25/22 0436 08/26/22 0512  WBC 25.1* 25.9*  --  27.3* 34.5* 34.3* 37.2* 25.7*  CREATININE 0.90 0.94  --  0.98 1.27* 1.32* 1.26* 1.17*  LATICACIDVEN 1.0 1.2 1.8  --   --   --   --   --      Estimated Creatinine Clearance: 47.5 mL/min (A) (by C-G formula based on SCr of 1.17 mg/dL (H)).    Allergies  Allergen Reactions   Aspirin Other (See Comments)    Caused an asthma attack   Ibuprofen Swelling and Other (See Comments)    Angioedema and Asthma exacerbation   Sulfonamide Derivatives Swelling and Other (See Comments)    Face swelled SEVERELY   Penicillins Rash    7/3 CTX > 7/6 7/6 Cefepime> 7/6 Flagyl > 7/6 Micafungin >   7/6 BCx - NGTD 7/6 MRSA PCR - negative  Avelyn Touch D. Laney Potash, PharmD, BCPS, BCCCP 08/26/2022, 10:27 AM

## 2022-08-26 NOTE — Plan of Care (Signed)
EEG showed diffuse slowing.   MRI is still pending.   Electronically signed: Dr. Caryl Pina

## 2022-08-26 NOTE — Progress Notes (Signed)
NAME:  Debra Sharp, MRN:  [id number], DOB:  [birth year], LOS: 8 ADMISSION DATE:  08/18/2022, CONSULTATION DATE:  08/26/22 REFERRING MD: surgery  , CHIEF COMPLAINT:  Vent management   History of Present Illness:  Debra Sharp is a 72 y.o. F with PMH significant for HTN, HL, HFrEF, thrombocytopenia, COPD, Asthma, thrombocytopenia who presented with weakness and abdominal pain and anemia concerning for UGIB and iron deficiency anemia.  CT abdomen/pelvis was done which showed colonic distension and possible obstruction. She was seen by GI and plan was for EGD, however she could not tolerate prep.  Other problems included R-sided hydronephrosis and incidental acute stroke seen on head CT/MRI.  Repeat CT abd/pelvis on 7/6 showed new intraperitoneal free air with presumed perforation of the colon.   Surgery was consulted and patient went to the OR for an emergent ex-lap, R hemicolectomy, ileostomy with fistula and biopsy of pelvic mass.  Pt was left intubated and PCCM consulted for vent management  Pertinent  Medical History   has a past medical history of Anemia, Anxiety, Arthritis, Asthma, Blood transfusion without reported diagnosis, Cataract, COPD (chronic obstructive pulmonary disease) (HCC), Depression, GERD (gastroesophageal reflux disease), Hyperlipidemia, and Hypertension.  Significant Hospital Events: Including procedures, antibiotic start and stop dates in addition to other pertinent events   7/2 admitted to the hospitalist service  7/6 To OR for ex-lap after concern for bowel perf, left intubated 7/7 developed shock on levo; CVL and a-line placed 7/8 exutbated; post extubation left sided weakness and encephalopathic; neuro re-consulted; MRI and EEG ordered 7/9 off pressors, transfused 1 unit PRBC 7/10  No acute issues overnight, repeat MRI pending   Interim History / Subjective:  Will moan to physical stimuli   Objective   Blood pressure (!) 107/46, pulse 62, temperature 97.6 F (36.4  C), temperature source Axillary, resp. rate 12, height 5' 5.98" (1.676 m), weight 79.4 kg, SpO2 95 %.        Intake/Output Summary (Last 24 hours) at 08/26/2022 0839 Last data filed at 08/26/2022 0400 Gross per 24 hour  Intake 1603.66 ml  Output 1450 ml  Net 153.66 ml    Filed Weights   08/22/22 1718 08/22/22 2032 08/26/22 0448  Weight: 77.1 kg 73.8 kg 79.4 kg   Physical exam: General: Acute on chronically ill appearing elderly female lying in bed, in NAD HEENT: [place]/AT, MM pink/moist, PERRL,  Neuro: Alert and oriented x3, non-focal  CV: s1s2 regular rate and rhythm, no murmur, rubs, or gallops,  PULM:  Clear to ausculation, no increased work of breathing,  no added breath sounds GI: soft, bowel sounds active in all 4 quadrants, non-tender, non-distended, tolerating TF Extremities: warm/dry, 2= generalized edema  Skin: no rashes or lesions  Resolved Hospital Problem list   Septic shock  Assessment & Plan:  Acute respiratory insufficiency postprocedure COPD, not in exacerbation -Failed 7/8 P: Aspiration precaution  Pulmonary hygiene as able  PT/OT as able  Continue BDs  Acute peritonitis in the setting of colonic perforation due to obstructing pelvic mass s/p ex lap with right hemicolectomy end ileostomy creation -Improved -Stress dose steroids stopped 7/9 P: Neurosurgery following, appreciate assistance Continue cefepime, Flagyl, and micafungin Follow cultures Trend CBC and fever curve NG tube in place JP drain General surgery Ensure adequate pain control  Acute metabolic encephalopathy Incidental acute CVA  -MRI with contrast 7/4 single punctate enhancement in right frontal parietal, no evidence of mets at this time.  -EEG 7/9 no seizure P: Neurology following, appreciate assistance  Repeat MRI pending Frequent neurochecks Plavix when cleared by GI Consider statin PT/OT/SLP when appropriate Delirium precautions Minimize sedation  Acute UTI, R  hydronephrosis Acute kidney injury Hyperkalemia Non-anion gap metabolic acidosis -Likely secondary to obstructing mass  -Urology was consulted, options for possible internal stent discussed and patient declined at time P: Antibiotics as above Trend Bment Monitor urine output Avoid nephrotoxins  Acute on chronic iron deficiency anemia Possible GI bleed -Unable to tolerate prep for EGD P: GI following, appreciate assistance Hemoglobin remained stable a.m. 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 Harris, NP-C [facility] Personal contact information can be found on Amion  If no contact or response made please call [phone] 08/26/2022, 8:54 AM

## 2022-08-27 ENCOUNTER — Inpatient Hospital Stay (HOSPITAL_COMMUNITY): Payer: Medicare Other

## 2022-08-27 DIAGNOSIS — D6859 Other primary thrombophilia: Secondary | ICD-10-CM | POA: Diagnosis not present

## 2022-08-27 DIAGNOSIS — D62 Acute posthemorrhagic anemia: Secondary | ICD-10-CM | POA: Diagnosis not present

## 2022-08-27 DIAGNOSIS — J9601 Acute respiratory failure with hypoxia: Secondary | ICD-10-CM | POA: Diagnosis not present

## 2022-08-27 DIAGNOSIS — C801 Malignant (primary) neoplasm, unspecified: Secondary | ICD-10-CM | POA: Diagnosis not present

## 2022-08-27 DIAGNOSIS — D649 Anemia, unspecified: Secondary | ICD-10-CM | POA: Diagnosis not present

## 2022-08-27 DIAGNOSIS — K922 Gastrointestinal hemorrhage, unspecified: Secondary | ICD-10-CM | POA: Diagnosis not present

## 2022-08-27 DIAGNOSIS — A419 Sepsis, unspecified organism: Secondary | ICD-10-CM | POA: Diagnosis not present

## 2022-08-27 DIAGNOSIS — I6340 Cerebral infarction due to embolism of unspecified cerebral artery: Secondary | ICD-10-CM | POA: Diagnosis not present

## 2022-08-27 DIAGNOSIS — Z7189 Other specified counseling: Secondary | ICD-10-CM

## 2022-08-27 LAB — BASIC METABOLIC PANEL
Anion gap: 7 (ref 5–15)
BUN: 34 mg/dL — ABNORMAL HIGH (ref 8–23)
CO2: 22 mmol/L (ref 22–32)
Calcium: 8 mg/dL — ABNORMAL LOW (ref 8.9–10.3)
Chloride: 109 mmol/L (ref 98–111)
Creatinine, Ser: 0.99 mg/dL (ref 0.44–1.00)
GFR, Estimated: >60 mL/min (ref >60)
Glucose, Bld: 142 mg/dL — ABNORMAL HIGH (ref 70–99)
Potassium: 4.1 mmol/L (ref 3.5–5.1)
Sodium: 138 mmol/L (ref 135–145)

## 2022-08-27 LAB — LIPID PANEL
Cholesterol: 97 mg/dL (ref 0–200)
HDL: 15 mg/dL — ABNORMAL LOW (ref >40)
LDL Cholesterol: 58 mg/dL (ref 0–99)
Total CHOL/HDL Ratio: 6.5 RATIO
Triglycerides: 122 mg/dL (ref <150)
VLDL: 24 mg/dL (ref 0–40)

## 2022-08-27 LAB — GLUCOSE, CAPILLARY
Glucose-Capillary: 129 mg/dL — ABNORMAL HIGH (ref 70–99)
Glucose-Capillary: 136 mg/dL — ABNORMAL HIGH (ref 70–99)
Glucose-Capillary: 151 mg/dL — ABNORMAL HIGH (ref 70–99)
Glucose-Capillary: 151 mg/dL — ABNORMAL HIGH (ref 70–99)
Glucose-Capillary: 155 mg/dL — ABNORMAL HIGH (ref 70–99)

## 2022-08-27 LAB — CBC
HCT: 33.7 % — ABNORMAL LOW (ref 36.0–46.0)
Hemoglobin: 10.7 g/dL — ABNORMAL LOW (ref 12.0–15.0)
MCH: 25.3 pg — ABNORMAL LOW (ref 26.0–34.0)
MCHC: 31.8 g/dL (ref 30.0–36.0)
MCV: 79.7 fL — ABNORMAL LOW (ref 80.0–100.0)
Platelets: 229 10*3/uL (ref 150–400)
RBC: 4.23 MIL/uL (ref 3.87–5.11)
RDW: 26.3 % — ABNORMAL HIGH (ref 11.5–15.5)
WBC: 19.4 10*3/uL — ABNORMAL HIGH (ref 4.0–10.5)
nRBC: 0.4 % — ABNORMAL HIGH (ref 0.0–0.2)

## 2022-08-27 LAB — PHOSPHORUS
Phosphorus: 2.6 mg/dL (ref 2.5–4.6)
Phosphorus: 1.9 mg/dL — ABNORMAL LOW (ref 2.5–4.6)

## 2022-08-27 LAB — CULTURE, BLOOD (ROUTINE X 2)

## 2022-08-27 LAB — MAGNESIUM
Magnesium: 2 mg/dL (ref 1.7–2.4)
Magnesium: 1.9 mg/dL (ref 1.7–2.4)

## 2022-08-27 MED ORDER — HEPARIN SODIUM (PORCINE) 5000 UNIT/ML IJ SOLN
5000.0000 [IU] | Freq: Three times a day (TID) | INTRAMUSCULAR | Status: DC
  Administered 2022-08-27: 5000 [IU] via SUBCUTANEOUS
  Filled 2022-08-27: qty 1

## 2022-08-27 MED ORDER — ACETAMINOPHEN 160 MG/5ML PO SOLN
650.0000 mg | Freq: Four times a day (QID) | ORAL | Status: DC
  Administered 2022-08-27 – 2022-09-04 (×25): 650 mg
  Filled 2022-08-27 (×26): qty 20.3

## 2022-08-27 MED ORDER — IOHEXOL 350 MG/ML SOLN
75.0000 mL | Freq: Once | INTRAVENOUS | Status: AC | PRN
  Administered 2022-08-27: 75 mL via INTRAVENOUS

## 2022-08-27 MED ORDER — HEPARIN (PORCINE) 25000 UT/250ML-% IV SOLN
2150.0000 [IU]/h | INTRAVENOUS | Status: DC
  Administered 2022-08-27: 1200 [IU]/h via INTRAVENOUS
  Administered 2022-08-28: 1400 [IU]/h via INTRAVENOUS
  Administered 2022-08-29: 1800 [IU]/h via INTRAVENOUS
  Administered 2022-08-30 (×2): 2000 [IU]/h via INTRAVENOUS
  Administered 2022-08-31: 2050 [IU]/h via INTRAVENOUS
  Administered 2022-09-01 (×2): 2150 [IU]/h via INTRAVENOUS
  Filled 2022-08-27 (×10): qty 250

## 2022-08-27 MED ORDER — HYDRALAZINE HCL 20 MG/ML IJ SOLN: 10.0000 mg | INTRAMUSCULAR | Status: DC | PRN

## 2022-08-27 MED ORDER — OXYCODONE HCL 5 MG PO TABS
5.0000 mg | ORAL_TABLET | ORAL | Status: DC | PRN
  Administered 2022-08-28 – 2022-08-29 (×3): 5 mg
  Administered 2022-08-29 – 2022-09-02 (×4): 10 mg
  Filled 2022-08-27: qty 2
  Filled 2022-08-27: qty 1
  Filled 2022-08-27 (×2): qty 2
  Filled 2022-08-27: qty 1
  Filled 2022-08-27: qty 2
  Filled 2022-08-27: qty 1

## 2022-08-27 MED ORDER — ACETAMINOPHEN 325 MG PO TABS
650.0000 mg | ORAL_TABLET | Freq: Four times a day (QID) | ORAL | Status: DC
  Administered 2022-08-27 (×2): 650 mg
  Filled 2022-08-27 (×3): qty 2

## 2022-08-27 NOTE — Progress Notes (Signed)
    Referral received for Debra Sharp :goals of care discussion. Chart reviewed and updates received from RN. Patient assessed and is unable to engage appropriately in discussions. Attempted to contact patient's son Debra Maduro. Unable to reach. Voicemail left with contact information given.   PMT will re-attempt to contact family at a later time/date. Detailed note and recommendations to follow once GOC has been completed.   Thank you for your referral and allowing PMT to assist in [name]'s care.   Richardson Dopp, [facility] Palliative Medicine Team  Team Phone # [phone]   NO CHARGE

## 2022-08-27 NOTE — Progress Notes (Addendum)
STROKE TEAM PROGRESS NOTE   INTERVAL HISTORY No family is at the bedside.  Patient is laying in bed complaining of dry mouth.  Wet sponge provided.  Vital signs stable with tachycardia in the low 100s.  Afebrile. Patient is flaccid in the left upper extremity with decreased sensation to left arm.  She is oriented.  Minimal flicker of movement of the left foot noted.  RLE with minimal antigravity attempt, wiggles toes.  Right upper extremity moves without gravity.  MRI brain: Acute and late acute infarcts in multiple vascular territories. No acute hemorrhage or significant mass effect.  Vitals:   08/27/22 0700 08/27/22 0737 08/27/22 0739 08/27/22 0751  BP: (!) 151/69     Pulse: 100     Resp: 14     Temp:    98.6 F (37 C)  TempSrc:    Axillary  SpO2: 95% 96% 96%   Weight:      Height:       CBC:  Recent Labs  Lab 08/25/22 0436 08/25/22 1054 08/25/22 1728 08/26/22 0512  WBC 37.2*  --   --  25.7*  HGB 5.8*   < > 8.9* 9.0*  HCT 20.0*   < > 27.8* 28.5*  MCV 76.9*  --   --  80.3  PLT 273  --   --  224   < > = values in this interval not displayed.   Basic Metabolic Panel:  Recent Labs  Lab 08/25/22 0436 08/26/22 0512 08/26/22 1659  NA 135 138  --   K 4.5 4.1  --   CL 105 105  --   CO2 23 23  --   GLUCOSE 126* 96  --   BUN 31* 34*  --   CREATININE 1.26* 1.17*  --   CALCIUM 7.9* 8.2*  --   MG  --  2.1 2.1  PHOS  --  3.5 2.9   Lipid Panel:  Recent Labs  Lab 08/26/22 0512  TRIG 94   HgbA1c: No results for input(s): "HGBA1C" in the last 168 hours. Urine Drug Screen: No results for input(s): "LABOPIA", "COCAINSCRNUR", "LABBENZ", "AMPHETMU", "THCU", "LABBARB" in the last 168 hours.  Alcohol Level No results for input(s): "ETH" in the last 168 hours.  IMAGING past 24 hours CT ANGIO HEAD NECK W WO CM  Result Date: 08/26/2022 CLINICAL DATA:  Follow-up embolic strokes EXAM: CT ANGIOGRAPHY HEAD AND NECK WITH AND WITHOUT CONTRAST TECHNIQUE: Multidetector CT imaging of the  head and neck was performed using the standard protocol during bolus administration of intravenous contrast. Multiplanar CT image reconstructions and MIPs were obtained to evaluate the vascular anatomy. Carotid stenosis measurements (when applicable) are obtained utilizing NASCET criteria, using the distal internal carotid diameter as the denominator. RADIATION DOSE REDUCTION: This exam was performed according to the departmental dose-optimization program which includes automated exposure control, adjustment of the mA and/or kV according to patient size and/or use of iterative reconstruction technique. 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 No evidence of progression or hemorrhage. No hydrocephalus or shift. Vascular: See below Skull: No acute or aggressive Sinuses/Orbits: Finding sinonasal polyps. Chronic left sphenoid sinusitis with sclerotic wall thickening. Review of the MIP images confirms the above findings CTA NECK FINDINGS Aortic arch: Atheromatous plaque with 3 vessel branching. Right carotid system: Mainly calcified atherosclerosis at the bifurcation without  stenosis or ulceration. Left carotid system: Mainly calcified atherosclerosis at the bifurcation without stenosis or ulceration. 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 The left vertebral artery is dominant. At the proximal right V2 segment there is hazy hypoenhancement of the right vertebral lumen. No ulceration, beading, or dissection flap. Robust and symmetric flow by the V4 segments. Skeleton: Generalized cervical spine degeneration. Other neck: No acute finding Upper chest: Layering pleural effusions. Left IJ line with tip at the SVC. An enteric tube traverses the esophagus. Review of the MIP  images confirms the above findings CTA HEAD FINDINGS Anterior circulation: Atheromatous plaque is mild. No major branch occlusion, beading, or irregularity. Hypoplastic right A1 segment. Negative for aneurysm. Posterior circulation: Left dominant vertebral artery. The vertebral and basilar arteries are smoothly contoured and diffusely patent. Mild atheromatous irregularity of the posterior cerebral arteries. Venous sinuses: Unremarkable Anatomic variants: None significant Review of the MIP images confirms the above findings IMPRESSION: 1. No emergent arterial finding. 2. 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 3. Short segment of right V2 hypoenhancement without discrete underlying stenosis or dissection flap. 4. No flow limiting stenosis in the anterior circulation. Electronically Signed   By: Tiburcio Pea M.D.   On: 08/26/2022 20:24   DG Abd Portable 1V  Result Date: 08/26/2022 CLINICAL DATA:  Feeding tube placement. EXAM: PORTABLE ABDOMEN - 1 VIEW COMPARISON:  August 25, 2022. FINDINGS: Distal tip of feeding tube is seen in expected position of distal stomach. IMPRESSION: Distal tip of feeding tube is seen in expected position of distal stomach. Electronically Signed   By: Lupita Raider M.D.   On: 08/26/2022 13:34   MR BRAIN WO CONTRAST  Result Date: 08/26/2022 CLINICAL DATA:  Neuro deficit, acute, stroke suspected neuro change, code stroke at 1830. EXAM: MRI HEAD WITHOUT CONTRAST TECHNIQUE: Multiplanar, multiecho pulse sequences of the brain and surrounding structures were obtained without intravenous contrast. COMPARISON:  Head CT 08/24/2022.  MRI brain 08/20/2022, 08/18/2022. FINDINGS: Brain: Cortical restricted diffusion along the right-greater-than-left superior frontal sulci, as well as in the right parietal lobe and right occipital lobe, with associated edema and partial sulcal effacement, consistent with late acute infarction. Few foci of acute infarction in the left  occipital lobe and left cerebellar hemisphere. No acute hemorrhage or significant mass effect. No hydrocephalus or extra-axial collection. Vascular: Normal flow voids. Skull and upper cervical spine: Normal marrow signal. Sinuses/Orbits: Unremarkable. Other: None. IMPRESSION: 1. 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 2. No acute hemorrhage or significant mass effect. Electronically Signed   By: Orvan Falconer M.D.   On: 08/26/2022 12:53    PHYSICAL EXAM Constitutional: Ill-appearing Caucasian female laying in ICU bed.  In no acute distress. Psych: Fluctuating levels of cooperation during exam, calm throughout assessment. Eyes: No scleral injection HENT: No OP obstrucion MSK: no joint deformities or swelling Cardiovascular: Tachycardic to the low 100s during examination Respiratory: Effort normal, non-labored breathing GI: No distention, tender to palpitation due to recent surgical procedure. Skin: WDI  Neuro: Mental Status: Drowsy initially, wakes to voice.  Prefers to keep eyes closed during examination.  Will open eyes to command. Patient is oriented to self, place, situation, age, month, year. Patient follows simple commands.  Attention and concentration fluctuate throughout exam. Cranial Nerves: II: Visual Fields are full. PERRL. III,IV, VI: Fixates and tracks examiner throughout. V: Facial sensation is intact and symmetric to light touch  VII: Facial movement is symmetric resting and with movement VIII: Hearing is intact to voice X: Palate elevates symmetrically XI: Head is turned rightward, no elevation of left shoulder to command XII: Tongue protrudes midline without atrophy or fasciculations.  Motor: Bulk is normal.  Left upper extremity is flaccid.  Left lower extremity with inconsistent minimal flicker movement to noxious stimuli.  Right lower extremity moves without gravity, patient wiggles toes to commands.  When  asked to elevate right lower extremity, there is minimal effort to elevation.  Right upper extremity moves without gravity with weak grip strength. Sensory: Decreased sensation to the left upper extremity reported to light touch.  Bilateral lower extremity sensation is intact and symmetric to light touch. Cerebellar: Patient does not perform.  ASSESSMENT/PLAN Debra Sharp is a 72 y.o. female with history of HTN, HLD, HFrEF, thrombocytopenia, COPD, asthma, and depression presenting initially with generalized weakness, abdominal pain, and anemia concerning for UGIB and IDA.  Work up concerning for colon perforation and patient went to the OR for emergent ex-lap, right hemicolectomy, ileostomy with fistula, and biopsy of pelvic mass on 08/22/22. Patient was extubated on 08/24/22 and examinations revealed patient with new left-sided weakness. MRI revealed acute and late acute infarcts in multiple vascular territories.   Stroke: b/l anterior and posterior infarcts, pattern more concerning for hypoperfusion in the setting of severe anemia and septic shock with hypotension.  DDx including hypercoagulable state with suspected malignancy  Stroke earlier this admission - Incidental punctate infarcts at right parietal lobe, left frontal centrum semi ovale, and right cerebellum. Etiology likely small vessel disease versus embolic from malignancy  CT Head 08/24/22: Questionable area of loss of gray-white differentiation in the high right parietal lobe concerning for area of acute infarction. Atrophy, chronic small vessel disease.  CTA head & neck 08/26/22: No emergent arterial finding. 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 Short segment of right V2 hypoenhancement without discrete underlying stenosis or dissection flap. No flow limiting stenosis in the anterior circulation.  MRI 08/26/22: Acute and late acute infarcts in multiple vascular territories, consistent with a central embolic  source. No acute hemorrhage or significant mass effect.  Carotid Doppler 08/21/22: Right subclavian artery was stenotic.   2D Echo 08/19/22: LVEF 40-45%  TEE pending  Lower extremity venous Doppler no DVT  LDL 94 HgbA1c 4.9 VTE prophylaxis - SCDs No antithrombotic prior to admission, now on No antithrombotic due to anemia, sepsis, possible further GI procedures with colonic perforation, pelvic masses.  Patient allergic to aspirin.  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 primary colonic malignancy, secondary pelvic mass  Septic Shock due to peritonitis with colonic perforation secondary to obstructing pelvic mass Acute UTI with right hydronephrosis at admission 2/2 obstructing mass Leukocytosis - Hgb 8.7 on admission. On 7/8 Hgb 7.1 > 5.8 > 6.6 > 8.9 > 9.0 > 10.7 - WBC 11.2 > 25.1 > 25.9 > 27.3 > 34.5 > 34.3 > 37.2 > 25.7 > 19.4  - On broad spectrum antibiotics- micafungin, flagyl, cefepime - s/p ex-lap 08/24/22  - Pelvic and bowel mass biopsies sent 7/8   Acute encephalopathy - Likely multifactorial with critical illness encephalopathy, delirium - EEG 08/25/22 suggest moderate diffuse encephalopathy of nonspecific etiology likely related to toxic metabolic causes.  No seizures or  definite epileptiform discharges were seen. - Delirium precautions  Essential hypertension Septic shock during hospitalization due to colonic perforation (as above) Home meds:  lasix, lisinopril Unstable Avoid hypotension Required levophed 7/8 - 7/10, vasopressin 7/8 BP goal normotensive  Hyperlipidemia Home meds:  lovastatin LDL 94, goal < 70 Patient placed on atorvastatin 20 mg inpatient Continue statin at discharge  Other Stroke Risk Factors Advanced Age >/= 64  Congestive heart  failure  Other Active Problems Chronic HFrEF Lisinopril held with AKI  Hospital day # 9  Lanae Boast, AGACNP-BC [facility] Pager: [phone]  ATTENDING NOTE: I reviewed above note and agree with the assessment and plan. Pt was seen and examined.   Son and RN are at bedside.  Patient laying in bed, lethargic, eyes closed, but able to open on voice.  Clear speech, asking for water.  No aphasia, follows simple commands, able to name repeat.  With eye opening, eye midline, right gaze incomplete, blinking to visual threat bilaterally.  Facial appears to be symmetrical, bilateral upper extremity no effort against gravity, bilateral lower extremity no effort against gravity, however wiggle toes bilaterally seems symmetrical.  Complaining of decreased light touch sensation on the left.  Not able to perform finger-to-nose bilaterally.  Patient had tiny 3 scattered infarcts earlier this admission, etiology unclear small vessel disease versus embolic source.  Post surgery, patient MRI showed bilateral anterior and posterior circulation infarcts, MRI pattern more watershed distribution and cortical ribboning, concerning for hypoperfusion in the setting of severe anemia, septic shock, hypotension during surgery and anesthesia.  Recommend to avoid BP and BP goal normotensive.  Currently no antithrombotic due to severe anemia needing blood transfusion, GI bleeding.  Recommend Plavix once cleared by GI and no more procedures.  On statin.  TEE pending.  PT and OT recommend SNF.  Will follow  For detailed assessment and plan, please refer to above/below as I have made changes wherever appropriate.   Marvel Plan, MD PhD Stroke Neurology 08/27/2022 7:07 PM   To contact Stroke Continuity provider, please refer to [email or web address]. After hours, contact General Neurology

## 2022-08-27 NOTE — Progress Notes (Signed)
Attempted to consent patient for TEE.  Patient refusing at this time due to dry tongue.  I have discussed thoroughly about the indications for this procedure, why we are doing it, and the risks.  Despite this patient continues to decline.

## 2022-08-27 NOTE — Progress Notes (Signed)
BLE venous duplex has been completed.     Results can be found under chart review under CV PROC. 08/27/2022 5:19 PM Felicia Both RVT, RDMS

## 2022-08-27 NOTE — Progress Notes (Addendum)
5 Days Post-Op Subjective: 7/11: First time meeting patient.  She was oriented to person and place but not time.  No acute events overnight.  Reviewed events over the last week with primary team.  Objective: Vital signs in last 24 hours: Temp:  [98.2 F (36.8 C)-98.6 F (37 C)] 98.6 F (37 C) (07/11 0751) Pulse Rate:  [61-130] 102 (07/11 0800) Resp:  [10-18] 16 (07/11 0800) BP: (117-156)/(55-71) 143/69 (07/11 0800) SpO2:  [90 %-99 %] 99 % (07/11 0800) Weight:  [78.1 kg] 78.1 kg (07/11 0320)  Intake/Output from previous day: 07/10 0701 - 07/11 0700 In: 1485.4 [I.V.:76.3; NG/GT:804; IV Piggyback:605] Out: 425 [Urine:275; Drains:50; Stool:100]  Intake/Output this shift: Total I/O In: 204.2 [NG/GT:99.2; IV Piggyback:105] Out: 650 [Urine:600; Stool:50]  Physical Exam:  General: Alert and oriented CV: No cyanosis Lungs: equal chest rise Abdomen: Soft, NTND, no rebound or guarding Gu: Pure wick catheter  Lab Results: Recent Labs    08/25/22 1728 08/26/22 0512 08/27/22 0741  HGB 8.9* 9.0* 10.7*  HCT 27.8* 28.5* 33.7*   BMET Recent Labs    08/26/22 0512 08/27/22 0741  NA 138 138  K 4.1 4.1  CL 105 109  CO2 23 22  GLUCOSE 96 142*  BUN 34* 34*  CREATININE 1.17* 0.99  CALCIUM 8.2* 8.0*     Studies/Results: US RENAL  Result Date: 08/27/2022 CLINICAL DATA:  Hydronephrosis EXAM: RENAL / URINARY TRACT ULTRASOUND COMPLETE COMPARISON:  CT abdomen pelvis 08/22/2022 FINDINGS: Right Kidney: Renal measurements: 11.1 x 5.8 x 5.4 cm = volume: 181 mL. Diffusely thinned cortex. Unchanged severe hydronephrosis. Left Kidney: Renal measurements: 11.9 x 8.4 x 5.4 cm = volume: 16 mL. Echogenicity within normal limits. No mass or hydronephrosis visualized. Bladder: Not visualized. Other: None. IMPRESSION: Unchanged severe right hydronephrosis. Electronically Signed   By: Acquanetta Belling M.D.   On: 08/27/2022 12:00   CT ANGIO HEAD NECK W WO CM  Result Date: 08/26/2022 CLINICAL DATA:   Follow-up embolic strokes EXAM: CT ANGIOGRAPHY HEAD AND NECK WITH AND WITHOUT CONTRAST TECHNIQUE: Multidetector CT imaging of the head and neck was performed using the standard protocol during bolus administration of intravenous contrast. Multiplanar CT image reconstructions and MIPs were obtained to evaluate the vascular anatomy. Carotid stenosis measurements (when applicable) are obtained utilizing NASCET criteria, using the distal internal carotid diameter as the denominator. RADIATION DOSE REDUCTION: This exam was performed according to the departmental dose-optimization program which includes automated exposure control, adjustment of the mA and/or kV according to patient size and/or use of iterative reconstruction technique. 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 No evidence of progression or hemorrhage. No hydrocephalus or shift. Vascular: See below Skull: No acute or aggressive Sinuses/Orbits: Finding sinonasal polyps. Chronic left sphenoid sinusitis with sclerotic wall thickening. Review of the MIP images confirms the above findings CTA NECK FINDINGS Aortic arch: Atheromatous plaque with 3 vessel branching. Right carotid system: Mainly calcified atherosclerosis at the bifurcation without stenosis or ulceration. Left carotid system: Mainly calcified atherosclerosis at the bifurcation without stenosis or ulceration. 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 The left vertebral artery is dominant. At the proximal right V2 segment there is hazy hypoenhancement of the right vertebral lumen. No ulceration, beading, or dissection flap. Robust and symmetric flow by the V4 segments. Skeleton: Generalized  cervical spine degeneration. Other neck: No acute finding Upper  chest: Layering pleural effusions. Left IJ line with tip at the SVC. An enteric tube traverses the esophagus. Review of the MIP images confirms the above findings CTA HEAD FINDINGS Anterior circulation: Atheromatous plaque is mild. No major branch occlusion, beading, or irregularity. Hypoplastic right A1 segment. Negative for aneurysm. Posterior circulation: Left dominant vertebral artery. The vertebral and basilar arteries are smoothly contoured and diffusely patent. Mild atheromatous irregularity of the posterior cerebral arteries. Venous sinuses: Unremarkable Anatomic variants: None significant Review of the MIP images confirms the above findings IMPRESSION: 1. No emergent arterial finding. 2. 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 3. Short segment of right V2 hypoenhancement without discrete underlying stenosis or dissection flap. 4. No flow limiting stenosis in the anterior circulation. Electronically Signed   By: Tiburcio Pea M.D.   On: 08/26/2022 20:24   DG Abd Portable 1V  Result Date: 08/26/2022 CLINICAL DATA:  Feeding tube placement. EXAM: PORTABLE ABDOMEN - 1 VIEW COMPARISON:  August 25, 2022. FINDINGS: Distal tip of feeding tube is seen in expected position of distal stomach. IMPRESSION: Distal tip of feeding tube is seen in expected position of distal stomach. Electronically Signed   By: Lupita Raider M.D.   On: 08/26/2022 13:34   MR BRAIN WO CONTRAST  Result Date: 08/26/2022 CLINICAL DATA:  Neuro deficit, acute, stroke suspected neuro change, code stroke at 1830. EXAM: MRI HEAD WITHOUT CONTRAST TECHNIQUE: Multiplanar, multiecho pulse sequences of the brain and surrounding structures were obtained without intravenous contrast. COMPARISON:  Head CT 08/24/2022.  MRI brain 08/20/2022, 08/18/2022. FINDINGS: Brain: Cortical restricted diffusion along the right-greater-than-left superior frontal sulci, as well as in the right parietal lobe and right occipital lobe, with  associated edema and partial sulcal effacement, consistent with late acute infarction. Few foci of acute infarction in the left occipital lobe and left cerebellar hemisphere. No acute hemorrhage or significant mass effect. No hydrocephalus or extra-axial collection. Vascular: Normal flow voids. Skull and upper cervical spine: Normal marrow signal. Sinuses/Orbits: Unremarkable. Other: None. IMPRESSION: 1. 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 2. No acute hemorrhage or significant mass effect. Electronically Signed   By: Orvan Falconer M.D.   On: 08/26/2022 12:53    Assessment/Plan: # Side hydronephrosis 2/2 abdominal mass  Unfortunately it appears patient had bowel perforation shortly after we saw her and required hemicolectomy.  It was unclear if this would have improvement in ureteral obstruction.  Repeat renal ultrasound was collected today-7/11.  Right side hydronephrosis was unchanged.  Now that she has undergone surgery it is unlikely that any more of this pelvic mass will be removed with her hostile abdomen.  She will not be able to have this kidney obstructed for another 6 months or so without losing it.  I was able to discuss this with her son over the phone who wanted to review it with her mother before making a decision.  I will circle back with her over the next couple of days to hopefully schedule with interventional radiology.   LOS: 9 days   Elmon Kirschner, NP [facility] Pager: [phone]  08/27/2022, 12:47 PM

## 2022-08-27 NOTE — Progress Notes (Signed)
General Surgery Follow Up Note  Subjective:    Overnight Issues:   Objective:  Vital signs for last 24 hours: Temp:  [97.2 F (36.2 C)-98.6 F (37 C)] 98.2 F (36.8 C) (07/11 0345) Pulse Rate:  [61-130] 100 (07/11 0700) Resp:  [10-18] 14 (07/11 0700) BP: (114-156)/(52-71) 151/69 (07/11 0700) SpO2:  [90 %-99 %] 95 % (07/11 0700) Arterial Line BP: (136-174)/(52-66) 174/66 (07/10 1130) Weight:  [78.1 kg] 78.1 kg (07/11 0320)  Hemodynamic parameters for last 24 hours:    Intake/Output from previous day: 07/10 0701 - 07/11 0700 In: 1485.4 [I.V.:76.3; NG/GT:804; IV Piggyback:605] Out: 425 [Urine:275; Drains:50; Stool:100]  Intake/Output this shift: No intake/output data recorded.  Vent settings for last 24 hours:    Physical Exam:  Gen: comfortable, no distress Neuro: follows commands, alert, communicative HEENT: PERRL Neck: supple CV: RRR Pulm: unlabored breathing on [place] Abd: soft, NT, midline wound with granulation tissue, JP purulent GU: Foley out Extr: wwp, no edema  Results for orders placed or performed during the hospital encounter of 08/18/22 (from the past 24 hour(s))  BLOOD TRANSFUSION REPORT - SCANNED     Status: None   Collection Time: 08/26/22 10:12 AM   Narrative   Ordered by an unspecified provider.  Glucose, capillary     Status: None   Collection Time: 08/26/22 11:19 AM  Result Value Ref Range   Glucose-Capillary 75 70 - 99 mg/dL  Glucose, capillary     Status: None   Collection Time: 08/26/22  3:41 PM  Result Value Ref Range   Glucose-Capillary 81 70 - 99 mg/dL  Magnesium     Status: None   Collection Time: 08/26/22  4:59 PM  Result Value Ref Range   Magnesium 2.1 1.7 - 2.4 mg/dL  Phosphorus     Status: None   Collection Time: 08/26/22  4:59 PM  Result Value Ref Range   Phosphorus 2.9 2.5 - 4.6 mg/dL  Glucose, capillary     Status: Abnormal   Collection Time: 08/26/22  7:40 PM  Result Value Ref Range   Glucose-Capillary 106 (H) 70 - 99  mg/dL  Glucose, capillary     Status: Abnormal   Collection Time: 08/26/22 11:30 PM  Result Value Ref Range   Glucose-Capillary 121 (H) 70 - 99 mg/dL  Glucose, capillary     Status: Abnormal   Collection Time: 08/27/22  3:44 AM  Result Value Ref Range   Glucose-Capillary 129 (H) 70 - 99 mg/dL    Assessment & Plan:  Present on Admission:  Symptomatic anemia  Thrombocytosis  Iron deficiency anemia    LOS: 9 days   Additional comments:I reviewed the patient's new clinical lab test results.   and I reviewed the patients new imaging test results.    Septic shock VDRF, post-op Colonic perforation secondary to obstructing pelvic mass  POD4 S/P exploratory laparotomy, right hemicolectomy, ileostomy, mucus fistula, biopsy of pelvic mass Dr. 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 - BID wet to dry to midline - appreciate CCM management   New stroke - seen on MRI 7/10, suspected thromboembolic, defer to primary team/stroke tem for further w/u and management   FEN: NPO, TF-okay to advance to goal as tolerated, noted recent stroke, recommend SLP prior to PO intake (ordered) VTE:  ok  for LMWH from CCS standpoint, recommend 40mg  daily ID: cefepime/flagyl/micafungin, monitor WBC and drain output Foley: removed, poor documentation of UOP   Dispo - okay to transfer to progressive from surgical standpoint, recommend palliative consult for malignancy and incomplete resection    Diamantina Monks, MD Trauma & General Surgery Please use [email or web address] to contact on call provider  08/27/2022  *Care during the described time interval was provided by me. I have reviewed this patient's available data, including medical history, events of note, physical examination and test results as  part of my evaluation.

## 2022-08-27 NOTE — Consult Note (Addendum)
WOC Nurse ostomy consult note;  Exp lap w/R hemicolectomy; ileostomy and mucus fistula performed 08/22/2022  Stoma type/location:  RMQ ileostomy, RLQ mucus fistula  Stomal assessment/size:  1 3/4" ileostomy, red moist, slightly oval; mucus fistula 1 3/8" round, red moist; both above skin level but located in close proximity with narrow intact bridge of skin in between, making it difficult to apply 2 separate pouches.    Peristomal assessment: intact peristomal skin; patient with a dark colored skin tag near superior portion of stoma, some creasing at 3 o'clock  Output: 50 cc dark brown liquid stool Ostomy pouching: Medium Kathrine Cords  [facility] [place] [phone]) pouch placed to cover both ileostomy and fistula; pattern left in room. Attached to bedside drainage bag. Instructions provided for bedside nurses to perform pouch change PRN if leaking. Pt did not watch the procedure or ask questions and does not appear aware of ostomy pouch. No family present. 3 sets of supplies left at the bedside for staff nurse's use, as well as educational materials.  Enrolled patient in [place] Secure Start DC program: NOT YET.  WOC team will assess twice weekly while in ICU and begin teaching sessions when stable and out of ICU. Thank-you,  Cammie Mcgee MSN, RN, CWOCN, [place], CNS [phone]

## 2022-08-27 NOTE — Progress Notes (Signed)
Tachycardic up to 130s, not complaining of pain.  EKG> sinus tach. Remaining in ~120 range for 30 min. Since she has been off AC post-op until today, has cancer, had major surgery, will order CT PE.  Steffanie Dunn, DO 08/27/22 3:55 PM [facility]  For contact information, see Amion. If no response to pager, please call PCCM consult pager. After hours, 7PM- 7AM, please call Elink.

## 2022-08-27 NOTE — Progress Notes (Addendum)
ANTICOAGULATION CONSULT NOTE - Initial Consult  Pharmacy Consult for heparin  Indication: pulmonary embolus  Allergies  Allergen Reactions   Aspirin Other (See Comments)    Caused an asthma attack   Ibuprofen Swelling and Other (See Comments)    Angioedema and Asthma exacerbation   Sulfonamide Derivatives Swelling and Other (See Comments)    Face swelled SEVERELY   Penicillins Rash    Patient Measurements: Height: 5' 5.98" (167.6 cm) Weight: 78.1 kg (172 lb 2.9 oz) IBW/kg (Calculated) : 59.26 Heparin Dosing Weight: 75kg  Vital Signs: Temp: 98.1 F (36.7 C) (07/11 1715) Temp Source: Oral (07/11 1715) BP: 138/73 (07/11 1715) Pulse Rate: 109 (07/11 1715)  Labs: Recent Labs    08/25/22 0436 08/25/22 1054 08/25/22 1728 08/26/22 0512 08/27/22 0741  HGB 5.8*   < > 8.9* 9.0* 10.7*  HCT 20.0*   < > 27.8* 28.5* 33.7*  PLT 273  --   --  224 229  CREATININE 1.26*  --   --  1.17* 0.99   < > = values in this interval not displayed.    Estimated Creatinine Clearance: 55.8 mL/min (by C-G formula based on SCr of 0.99 mg/dL).   Medical History: Past Medical History:  Diagnosis Date   Anemia    Anxiety    Arthritis    Asthma    Blood transfusion without reported diagnosis    Cataract    removed bilateral   COPD (chronic obstructive pulmonary disease) (HCC)    Depression    GERD (gastroesophageal reflux disease)    Hyperlipidemia    Hypertension     Medications:  Medications Prior to Admission  Medication Sig Dispense Refill Last Dose   cetirizine (ZYRTEC) 10 MG tablet Take 1 tablet (10 mg total) by mouth daily. (Patient taking differently: Take 10 mg by mouth daily as needed for allergies or rhinitis.) 30 tablet 11 08/17/2022   ferrous sulfate 325 (65 FE) MG EC tablet Take 325 mg by mouth daily.   08/17/2022   fluticasone (FLONASE) 50 MCG/ACT nasal spray Place 2 sprays into both nostrils daily. (Patient taking differently: Place 2 sprays into both nostrils at bedtime as  needed for allergies or rhinitis.) 16 g 6 08/16/2022   lisinopril (ZESTRIL) 40 MG tablet Take 40 mg by mouth in the morning.   08/17/2022   lovastatin (MEVACOR) 20 MG tablet Take 20 mg by mouth at bedtime.   08/17/2022   PROAIR HFA 108 (90 Base) MCG/ACT inhaler Inhale 2 puffs into the lungs every 6 (six) hours as needed for wheezing or shortness of breath.   08/17/2022   sertraline (ZOLOFT) 100 MG tablet Take 150 mg by mouth in the morning.   08/17/2022   SYMBICORT 160-4.5 MCG/ACT inhaler Inhale 2 puffs into the lungs in the morning and at bedtime.   08/17/2022   furosemide (LASIX) 20 MG tablet Take 1 tablet (20 mg total) by mouth daily. (Patient not taking: Reported on 05/13/2022) 10 tablet 0 Not Taking   predniSONE (STERAPRED UNI-PAK 21 TAB) 10 MG (21) TBPK tablet 6 tabs for 1 day, then 5 tabs for 1 das, then 4 tabs for 1 day, then 3 tabs for 1 day, 2 tabs for 1 day, then 1 tab for 1 day (Patient not taking: Reported on 04/24/2022) 21 tablet 0 Not Taking   Scheduled:   acetaminophen  650 mg Per Tube Q6H   arformoterol  15 mcg Nebulization BID   atorvastatin  20 mg Per Tube Daily   budesonide (  PULMICORT) nebulizer solution  0.5 mg Nebulization BID   Chlorhexidine Gluconate Cloth  6 each Topical Q0600   feeding supplement (PROSource TF20)  60 mL Per Tube Daily   heparin injection (subcutaneous)  5,000 Units Subcutaneous Q8H   insulin aspart  0-9 Units Subcutaneous Q4H   multivitamin with minerals  1 tablet Per Tube Daily   pantoprazole (PROTONIX) IV  40 mg Intravenous Q12H   revefenacin  175 mcg Nebulization Daily   sodium chloride flush  10-40 mL Intracatheter Q12H   thiamine  100 mg Per Tube Daily   Infusions:   sodium chloride 75 mL/hr at 08/25/22 0000   ceFEPime (MAXIPIME) IV Stopped (08/27/22 1011)   feeding supplement (VITAL 1.5 CAL) 1,000 mL (08/27/22 1315)   metronidazole 500 mg (08/27/22 1723)   micafungin (MYCAMINE) 100 mg in sodium chloride 0.9 % 100 mL IVPB Stopped (08/27/22 0844)     Assessment: Pt with obstructing pelvic mass, CVA, and adencarcinoma. CT showed PE tonight. Heparin has been ordered for anticoagulation. D/t her recent CVA, we will try to avoid bolus use a high starting rate. Pt also just had the 5000 units SQ tonight.  Hgb 10.7 s/p prbc Plt wnl  Goal of Therapy:  Heparin level 0.3-0.5 units/ml Monitor platelets by anticoagulation protocol: Yes   Plan:  No bolus Heparin infusion at 1200 units/hr Check 8 hr HL  Daily HL and CBC  Ulyses Southward, PharmD, BCIDP, AAHIVP, CPP Infectious Disease Pharmacist 08/27/2022 7:14 PM

## 2022-08-27 NOTE — Progress Notes (Signed)
Acute PE on CT- starting heparin gtt. Can get echo tomorrow, but no evidence of RV strain on CT.   Steffanie Dunn, DO 08/27/22 6:57 PM [facility]  For contact information, see Amion. If no response to pager, please call PCCM consult pager. After hours, 7PM- 7AM, please call Elink.

## 2022-08-27 NOTE — Progress Notes (Signed)
Patient transported to 4E22 via hospital bed & staff. Oriented to new room, checked in with primary nurse Kristi. Son, Debra Sharp was called and updated on transfer & room number,

## 2022-08-27 NOTE — Plan of Care (Signed)
  Problem: Education: Goal: Knowledge of General Education information will improve Description: Including pain rating scale, medication(s)/side effects and non-pharmacologic comfort measures Outcome: Progressing   Problem: Health Behavior/Discharge Planning: Goal: Ability to manage health-related needs will improve Outcome: Progressing   Problem: Clinical Measurements: Goal: Ability to maintain clinical measurements within normal limits will improve Outcome: Progressing Goal: Will remain free from infection Outcome: Progressing Goal: Diagnostic test results will improve Outcome: Progressing Goal: Respiratory complications will improve Outcome: Progressing Goal: Cardiovascular complication will be avoided Outcome: Progressing   Problem: Activity: Goal: Risk for activity intolerance will decrease Outcome: Progressing   Problem: Nutrition: Goal: Adequate nutrition will be maintained Outcome: Progressing   Problem: Coping: Goal: Level of anxiety will decrease Outcome: Progressing   Problem: Elimination: Goal: Will not experience complications related to bowel motility Outcome: Progressing Goal: Will not experience complications related to urinary retention Outcome: Progressing   Problem: Pain Managment: Goal: General experience of comfort will improve Outcome: Progressing   Problem: Safety: Goal: Ability to remain free from injury will improve Outcome: Progressing   Problem: Skin Integrity: Goal: Risk for impaired skin integrity will decrease Outcome: Progressing   Problem: Activity: Goal: Ability to tolerate increased activity will improve Outcome: Progressing   Problem: Respiratory: Goal: Ability to maintain a clear airway and adequate ventilation will improve Outcome: Progressing   Problem: Role Relationship: Goal: Method of communication will improve Outcome: Progressing   Problem: Safety: Goal: Non-violent Restraint(s) Outcome: Progressing    Problem: Education: Goal: Ability to describe self-care measures that may prevent or decrease complications (Diabetes Survival Skills Education) will improve Outcome: Progressing Goal: Individualized Educational Video(s) Outcome: Progressing   Problem: Coping: Goal: Ability to adjust to condition or change in health will improve Outcome: Progressing   Problem: Fluid Volume: Goal: Ability to maintain a balanced intake and output will improve Outcome: Progressing   Problem: Health Behavior/Discharge Planning: Goal: Ability to identify and utilize available resources and services will improve Outcome: Progressing Goal: Ability to manage health-related needs will improve Outcome: Progressing   Problem: Metabolic: Goal: Ability to maintain appropriate glucose levels will improve Outcome: Progressing   Problem: Nutritional: Goal: Maintenance of adequate nutrition will improve Outcome: Progressing Goal: Progress toward achieving an optimal weight will improve Outcome: Progressing   Problem: Skin Integrity: Goal: Risk for impaired skin integrity will decrease Outcome: Progressing   Problem: Tissue Perfusion: Goal: Adequacy of tissue perfusion will improve Outcome: Progressing   

## 2022-08-27 NOTE — Progress Notes (Signed)
NAME:  Debra Sharp, MRN:  [id number], DOB:  1950/05/29, LOS: 9 ADMISSION DATE:  08/18/2022, CONSULTATION DATE:  08/27/22 REFERRING MD: surgery  , CHIEF COMPLAINT:  Vent management   History of Present Illness:  Debra Sharp is a 72 y.o. F with PMH significant for HTN, HL, HFrEF, thrombocytopenia, COPD, Asthma, thrombocytopenia who presented with weakness and abdominal pain and anemia concerning for UGIB and iron deficiency anemia.  CT abdomen/pelvis was done which showed colonic distension and possible obstruction. She was seen by GI and plan was for EGD, however she could not tolerate prep.  Other problems included R-sided hydronephrosis and incidental acute stroke seen on head CT/MRI.  Repeat CT abd/pelvis on 7/6 showed new intraperitoneal free air with presumed perforation of the colon.   Surgery was consulted and patient went to the OR for an emergent ex-lap, R hemicolectomy, ileostomy with fistula and biopsy of pelvic mass.  Pt was left intubated and PCCM consulted for vent management  Pertinent  Medical History   has a past medical history of Anemia, Anxiety, Arthritis, Asthma, Blood transfusion without reported diagnosis, Cataract, COPD (chronic obstructive pulmonary disease) (HCC), Depression, GERD (gastroesophageal reflux disease), Hyperlipidemia, and Hypertension.  Significant Hospital Events: Including procedures, antibiotic start and stop dates in addition to other pertinent events   7/2 admitted to the hospitalist service  7/6 To OR for ex-lap after concern for bowel perf, left intubated 7/7 developed shock on levo; CVL and a-line placed 7/8 exutbated; post extubation left sided weakness and encephalopathic; neuro re-consulted; MRI and EEG ordered 7/9 off pressors, transfused 1 unit PRBC 7/10  No acute issues overnight, repeat MRI with multiple embolic strokes  Interim History / Subjective:  Today she complains of feeling thirsty and having mouth swelling. She denies abdominal  pain.   Objective   Blood pressure (!) 143/69, pulse (!) 102, temperature 98.6 F (37 C), temperature source Axillary, resp. rate 16, height 5' 5.98" (1.676 m), weight 78.1 kg, SpO2 99%.        Intake/Output Summary (Last 24 hours) at 08/27/2022 0844 Last data filed at 08/27/2022 0800 Gross per 24 hour  Intake 1485.38 ml  Output 475 ml  Net 1010.38 ml   Filed Weights   08/22/22 2032 08/26/22 0448 08/27/22 0320  Weight: 73.8 kg 79.4 kg 78.1 kg   Physical exam: General: ill-appearing woman lying in bed in NAD HEENT: [name]/AT, eyes anicteric Neuro: awake, answering some questions. Not moving around much in bed.  CV: S1S2, tachycardic, reg rhythm  PULM:  breathing comfortably on [place], CTAB GI: soft, mild TTP, drains in place. Midline incision covered.  Extremities: mild edema> worse in UE, no cyanosis Skin: warm, dry, some weeping in arms from previous IV sites  Blood cultures:  NG final  WBC 19.4 H/H 10.7/33.7 Platelets 229  BUN 34 Cr  0.99   Resolved Hospital Problem list   Septic shock  Assessment & Plan:  Acute respiratory insufficiency postprocedure COPD, not in exacerbation P: -pulmonary hygiene -wean O2 as able to maintain SpO2 >90%  Septic shock due to acute peritonitis due to colonic perforation due to obstructing pelvic mass s/p ex lap with right hemicolectomy end ileostomy creation. 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  -Stress dose steroids stopped 7/9 P: -appreciate surgery's management -con't broad antibiotics- micafungin, flagyl, cefepime -follow cultures -advancing TF  -pain control  Acute metabolic encephalopathy due to sepsis, ICU delirium Acute embolic CVAs  -MRI with contrast 7/4 single punctate enhancement in right frontal parietal, no  evidence of mets at this time.  -EEG 7/9 no seizure P: -Neurology following, appreciate management -TEE tomorrow -eventually needs plavix; can discuss further if this is actually needed since she  seems to have embolic strokes.  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 -Unable to tolerate prep for EGD P: -transfuse for Hb<7 or hemodynamically significant bleeding -anticipate she needs iron supplementation when infection is controlled -PPI BID  HTN Hyperlipidemia Chronic HFrEF -Echo 08/19/2022 EF of 40 to 45% with global hypokinesis, normal RV function P: -hold PTA lisinopril for now with recent AKI -hydralazine PRN; can add amlodipine if needed for HTN control -goal euvolemia  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 TRH to assume care tomorrow.   Best Practice (right click and "Reselect all SmartList Selections" daily)   Diet/type: tubefeeds DVT prophylaxis: prophylactic heparin  GI prophylaxis: PPI Lines: d/c 7/10 Foley:  N/A Code Status:  full code Last date of multidisciplinary goals of care discussion: 7/10 updated patient's son       Steffanie Dunn, DO 08/27/22 9:39 AM [facility]  For contact information, see Amion. If no response to pager, please call PCCM consult pager. After hours, 7PM-  7AM, please call Elink.

## 2022-08-27 NOTE — Evaluation (Addendum)
Clinical/Bedside Swallow Evaluation Patient Details  Name: Debra Sharp MRN: [id number] Date of Birth: Nov 24, 1950  Today's Date: 08/27/2022 Time: SLP Start Time (ACUTE ONLY): 0932 SLP Stop Time (ACUTE ONLY): 0950 SLP Time Calculation (min) (ACUTE ONLY): 18 min  Past Medical History:  Past Medical History:  Diagnosis Date   Anemia    Anxiety    Arthritis    Asthma    Blood transfusion without reported diagnosis    Cataract    removed bilateral   COPD (chronic obstructive pulmonary disease) (HCC)    Depression    GERD (gastroesophageal reflux disease)    Hyperlipidemia    Hypertension    Past Surgical History:  Past Surgical History:  Procedure Laterality Date   APPENDECTOMY     cataracts Bilateral    COLOSTOMY N/A 08/22/2022   Procedure: COLOSTOMY;  Surgeon: Diamantina Monks, MD;  Location: [facility] OR;  Service: General;  Laterality: N/A;   KNEE SURGERY     LAPAROTOMY N/A 08/22/2022   Procedure: EXPLORATORY LAPAROTOMY;  Surgeon: Diamantina Monks, MD;  Location: [facility] OR;  Service: General;  Laterality: N/A;   NASAL SINUS SURGERY     PARTIAL COLECTOMY N/A 08/22/2022   Procedure: PARTIAL COLECTOMY;  Surgeon: Diamantina Monks, MD;  Location: [facility] OR;  Service: General;  Laterality: N/A;   HPI:  72 yo female admitted 7/2 with generalized weakness, thrombocytosis and anemia. 7/2 MRI scattered punctate ischemic infarcts right parietal, left frontal, right cerebellum. Pt with progression to AKI with septic peritonitis. 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 7/8 extubated. Lt sided weakness noted post extubation with CT demonstrating possible Rt parietal infarct. PMHx: COPD, GERD, anemia, CHF, HLD, HTN    Assessment / Plan / Recommendation  Clinical Impression  Pt presents with signs of dysphagia suspected to be multifactorial in the setting of acute neurological changes, AMS, and overall deconditioning. Her vocal  quality is clear in the setting of recent intubation, and she has now been extubated for several days. Her mentation seems to be the largest barrier to PO intake at the moment, with pt eagerly requesting POs but then requiring step-by-step cues to accept and initiate swallowing. Cueing is needed more so with limited trials of purees (compared to thin liquids), which also elicited 5+ subswallows per bolus. Recommend allowing small amounts of water and ice chips with supervision, following completion of oral care and as mentation allows. SLP will f/u for signs of improved function as mentation begins to clear. Would also consider ordering SLP cognitive-linguistic evaluation in the setting of acute strokes. SLP Visit Diagnosis: Dysphagia, unspecified (R13.10)    Aspiration Risk  Moderate aspiration risk;Risk for inadequate nutrition/hydration    Diet Recommendation NPO;Alternative means - temporary;Other (Comment) (small sips of water or pieces of ice after oral care)    Medication Administration: Via alternative means Supervision: Full supervision/cueing for compensatory strategies;Staff to assist with self feeding Postural Changes: Seated upright at 90 degrees;Remain upright for at least 30 minutes after po intake    Other  Recommendations Oral Care Recommendations: Oral care QID;Oral care prior to ice chip/H20 Caregiver Recommendations: Have oral suction available    Recommendations for follow up therapy are one component of a multi-disciplinary discharge planning process, led by the attending physician.  Recommendations may be updated based on patient status, additional functional criteria and insurance authorization.  Follow up Recommendations Skilled nursing-short term rehab (<3 hours/day)      Assistance Recommended  at Discharge    Functional Status Assessment Patient has had a recent decline in their functional status and demonstrates the ability to make significant improvements in  function in a reasonable and predictable amount of time.  Frequency and Duration min 2x/week  2 weeks       Prognosis Prognosis for improved oropharyngeal function: Good Barriers to Reach Goals: Cognitive deficits      Swallow Study   General HPI: 72 yo female admitted 7/2 with generalized weakness, thrombocytosis and anemia. 7/2 MRI scattered punctate ischemic infarcts right parietal, left frontal, right cerebellum. Pt with progression to AKI with septic peritonitis. 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 7/8 extubated. Lt sided weakness noted post extubation with CT demonstrating possible Rt parietal infarct. PMHx: COPD, GERD, anemia, CHF, HLD, HTN Type of Study: Bedside Swallow Evaluation Previous Swallow Assessment: none in chart Diet Prior to this Study: NPO;Cortrak/Small bore NG tube Temperature Spikes Noted: No Respiratory Status: Room air History of Recent Intubation: Yes Total duration of intubation (days): 2 days Date extubated: 08/24/22 Behavior/Cognition: Alert;Cooperative;Requires cueing Oral Cavity Assessment: Other (comment) (pt reports feeling like her tongue is swollen) Oral Care Completed by SLP: Yes Oral Cavity - Dentition: Adequate natural dentition;Missing dentition Vision:  (keeps eyes closed through most of eval, needs to be prompted to open them) Self-Feeding Abilities: Total assist Patient Positioning: Upright in bed Baseline Vocal Quality: Normal Volitional Cough: Weak Volitional Swallow: Able to elicit    Oral/Motor/Sensory Function Overall Oral Motor/Sensory Function: Mild impairment Facial ROM: Within Functional Limits Facial Symmetry: Within Functional Limits Lingual ROM: Within Functional Limits Lingual Symmetry: Suspected CN XII (hypoglossal) dysfunction;Abnormal symmetry left (subtle deviation to her L upon protrusion) Lingual Strength: Reduced;Suspected CN XII  (hypoglossal) dysfunction Velum:  (limited visisbility, but appears to be elevating bilaterally)   Ice Chips Ice chips: Impaired Presentation: Spoon Oral Phase Impairments: Poor awareness of bolus   Thin Liquid Thin Liquid: Impaired Presentation: Spoon;Straw Oral Phase Impairments: Reduced labial seal;Poor awareness of bolus Oral Phase Functional Implications: Right anterior spillage;Oral holding    Nectar Thick Nectar Thick Liquid: Not tested   Honey Thick Honey Thick Liquid: Not tested   Puree Puree: Impaired Presentation: Spoon Oral Phase Impairments: Poor awareness of bolus Oral Phase Functional Implications: Oral holding Pharyngeal Phase Impairments: Multiple swallows   Solid     Solid: Not tested      Mahala Menghini., M.A. CCC-SLP Acute Rehabilitation Services Office [phone]  Secure chat preferred  08/27/2022,10:05 AM

## 2022-08-27 NOTE — Progress Notes (Signed)
Pt HR 130-140s. MD paged. Awaiting response.   08/27/22 1440  Vitals  BP (!) 118/58  MAP (mmHg) 74  BP Location Left Arm  BP Method Automatic  Patient Position (if appropriate) Lying  Pulse Rate (!) 132  Pulse Rate Source Monitor  ECG Heart Rate (!) 132  Resp (!) 21  MEWS COLOR  MEWS Score Color Red  Oxygen Therapy  SpO2 96 %  O2 Device Room Air  MEWS Score  MEWS Temp 0  MEWS Systolic 0  MEWS Pulse 3  MEWS RR 1  MEWS LOC 0  MEWS Score 4

## 2022-08-27 NOTE — Progress Notes (Signed)
I/O Cath for per protocol First I/o cath post foley removal.

## 2022-08-27 NOTE — Progress Notes (Signed)
Patient tongue beefy red, swollen and painful. MD notified.   Kenard Gower, RN

## 2022-08-27 NOTE — Progress Notes (Signed)
Patient arrived to 4E from [place]. VSS. Telemetry box applied, CCMD notified. Patient oriented to room and staff. Call bell in reach.  Kenard Gower, RN

## 2022-08-27 NOTE — Progress Notes (Signed)
Dr. Marisue Humble with [facility] [phone] calling to speak with provider. MD notified.   Kenard Gower, RN

## 2022-08-28 ENCOUNTER — Encounter (HOSPITAL_COMMUNITY)
Admission: EM | Disposition: A | Discharge status: Nursing Home (Includes ICF) | Payer: Self-pay | Source: Home / Self Care | Attending: Family Medicine

## 2022-08-28 ENCOUNTER — Other Ambulatory Visit (HOSPITAL_COMMUNITY): Payer: Medicare Other

## 2022-08-28 DIAGNOSIS — R531 Weakness: Secondary | ICD-10-CM

## 2022-08-28 DIAGNOSIS — Z515 Encounter for palliative care: Secondary | ICD-10-CM | POA: Diagnosis not present

## 2022-08-28 DIAGNOSIS — Z789 Other specified health status: Secondary | ICD-10-CM

## 2022-08-28 DIAGNOSIS — R6521 Severe sepsis with septic shock: Secondary | ICD-10-CM | POA: Diagnosis not present

## 2022-08-28 DIAGNOSIS — G9341 Metabolic encephalopathy: Secondary | ICD-10-CM | POA: Diagnosis not present

## 2022-08-28 DIAGNOSIS — A419 Sepsis, unspecified organism: Secondary | ICD-10-CM | POA: Diagnosis not present

## 2022-08-28 DIAGNOSIS — I6340 Cerebral infarction due to embolism of unspecified cerebral artery: Secondary | ICD-10-CM | POA: Diagnosis not present

## 2022-08-28 DIAGNOSIS — K922 Gastrointestinal hemorrhage, unspecified: Secondary | ICD-10-CM | POA: Diagnosis not present

## 2022-08-28 DIAGNOSIS — Z711 Person with feared health complaint in whom no diagnosis is made: Secondary | ICD-10-CM

## 2022-08-28 DIAGNOSIS — I6310 Cerebral infarction due to embolism of unspecified precerebral artery: Secondary | ICD-10-CM | POA: Diagnosis not present

## 2022-08-28 LAB — BASIC METABOLIC PANEL
Anion gap: 9 (ref 5–15)
BUN: 34 mg/dL — ABNORMAL HIGH (ref 8–23)
CO2: 20 mmol/L — ABNORMAL LOW (ref 22–32)
Calcium: 7.9 mg/dL — ABNORMAL LOW (ref 8.9–10.3)
Chloride: 110 mmol/L (ref 98–111)
Creatinine, Ser: 0.94 mg/dL (ref 0.44–1.00)
GFR, Estimated: >60 mL/min (ref >60)
Glucose, Bld: 101 mg/dL — ABNORMAL HIGH (ref 70–99)
Potassium: 4 mmol/L (ref 3.5–5.1)
Sodium: 139 mmol/L (ref 135–145)

## 2022-08-28 LAB — HEMOGLOBIN A1C
Hgb A1c MFr Bld: 5.4 % (ref 4.8–5.6)
Mean Plasma Glucose: 108 mg/dL

## 2022-08-28 LAB — GLUCOSE, CAPILLARY
Glucose-Capillary: 99 mg/dL (ref 70–99)
Glucose-Capillary: 95 mg/dL (ref 70–99)
Glucose-Capillary: 96 mg/dL (ref 70–99)
Glucose-Capillary: 137 mg/dL — ABNORMAL HIGH (ref 70–99)
Glucose-Capillary: 140 mg/dL — ABNORMAL HIGH (ref 70–99)
Glucose-Capillary: 134 mg/dL — ABNORMAL HIGH (ref 70–99)

## 2022-08-28 LAB — CBC
HCT: 38.8 % (ref 36.0–46.0)
Hemoglobin: 11.9 g/dL — ABNORMAL LOW (ref 12.0–15.0)
MCH: 25.7 pg — ABNORMAL LOW (ref 26.0–34.0)
MCHC: 30.7 g/dL (ref 30.0–36.0)
MCV: 83.8 fL (ref 80.0–100.0)
Platelets: 245 10*3/uL (ref 150–400)
RBC: 4.63 MIL/uL (ref 3.87–5.11)
RDW: 27.4 % — ABNORMAL HIGH (ref 11.5–15.5)
WBC: 19.9 10*3/uL — ABNORMAL HIGH (ref 4.0–10.5)
nRBC: 0.1 % (ref 0.0–0.2)

## 2022-08-28 LAB — HEPARIN LEVEL (UNFRACTIONATED)
Heparin Unfractionated: <0.1 IU/mL — ABNORMAL LOW (ref 0.30–0.70)
Heparin Unfractionated: 0.13 IU/mL — ABNORMAL LOW (ref 0.30–0.70)

## 2022-08-28 SURGERY — TRANSESOPHAGEAL ECHOCARDIOGRAM (TEE)
Anesthesia: Monitor Anesthesia Care

## 2022-08-28 MED ORDER — K PHOS MONO-SOD PHOS DI & MONO 155-852-130 MG PO TABS
500.0000 mg | ORAL_TABLET | Freq: Three times a day (TID) | ORAL | Status: DC
  Filled 2022-08-28 (×3): qty 2

## 2022-08-28 MED ORDER — K PHOS MONO-SOD PHOS DI & MONO 155-852-130 MG PO TABS
500.0000 mg | ORAL_TABLET | Freq: Three times a day (TID) | ORAL | Status: DC
  Administered 2022-08-28 (×2): 500 mg
  Filled 2022-08-28 (×3): qty 2

## 2022-08-28 MED ORDER — DEXTROSE 10 % IV SOLN: INTRAVENOUS | Status: AC

## 2022-08-28 NOTE — Consult Note (Signed)
Consultation Note Date: 08/28/2022   Patient Name: Debra Sharp  DOB: [birth year]  MRN: [id number]  Age / Sex: 72 y.o., female  PCP: Sharp, Debra Dance, MD Referring Physician: Burnadette Pop, MD  Reason for Consultation: Establishing goals of care  HPI/Patient Profile: 72 y.o. female  with past medical history of iron deficiency anemia, HTN, HLD, thrombocytopenia, COPD, CHF presented to ED on 08/18/22 with complaints of generalized weakness, nausea/vomiting, and abdominal pain. Patient was admitted on 08/18/2022 with acute on chronic IDA/blood loss anemia, incidental finding of age indeterminate infarct, thrombocythemia, hypokalemia.  Neurology was consulted in the setting of incidental findings of acute stroke on MRI - it was felt strokes were cardiac source and TEE was recommended, which patient refused. Urology was consulted for management of right hydronephrosis - felt it is due to obstruction from pelvic mass; after mass was surgically removed, state kidney can not be obstructed for another 6 months without losing it. GI was consulted for anemia and abdominal imaging - plan was for colonoscopy and endoscopy, however patient did not tolerate the prep and upon further imaging CT showed free air with concern for perforation and Surgery was consulted - patient underwent emergent exlap, creation of mucus fistula, right hemicolectomy, colostomy, and biopsy of pelvic mass on 7/6. She was subsequently intubated, on pressors, and admitted to ICU after surgery. She was extubated on 7/8 but remained encephalopathic, which has been gradually improving. Her hospitalization has been complicated by septic shock secondary to peritonitis as well as acute PE. 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 7/11 patient was stable enough for transfer out of the ICU.  Palliative Medicine was consulted for GOC in context of  malignancy and incomplete resection.  Clinical Assessment and Goals of Care: I have reviewed medical records including EPIC notes, labs, and imaging in detail. Received report from primary RN - no acute concerns. RN reports patient begrudgingly worked with PT today - patient "does not want to do anything." Per RN, mental status wax/wanes, but overall she has been oriented, but lethargic.  Went to visit patient at bedside - no family/visitors present. Patient was lying in bed asleep - she does verbally respond to voice/gentle touch; however, her eyes remained closed for entirety of visit. No signs or non-verbal gestures of pain or discomfort noted. No respiratory distress, increased work of breathing, or secretions noted. Coretrak in use. She is ill and frail appearing. She is oriented to self, place, situation, but not year. When asked how she is feeling, she tells me "I feel dry." Offered mouth swab, which she is accepting and expresses appreciation.   Offered meeting  to discuss diagnosis, prognosis, GOC, EOL wishes, disposition, and options and asked if she would like her son present. She tells me "I really would." She gives permission for me to call her son to schedule a meeting. She does not want me to call anyone else or have anyone else included in the meeting.  Called son/Debra - emotional support provided. I introduced Palliative Medicine as specialized medical care for people living with serious illness. It focuses on providing relief from the symptoms and stress of a serious illness. The goal is to improve quality of life for both the patient and the family. Offered family meeting - he requests tomorrow 7/13 at 11a.  Questions and concerns were addressed. The patient/family was encouraged to call with questions and/or concerns. PMT number was provided.  Primary Decision Maker: PATIENT if encephalopathy continues to improve. At this  time NOK is son/Debra Sharp    SUMMARY OF  RECOMMENDATIONS   Continue current plan of care and full code at this time Meeting with patient and her son scheduled for tomorrow 7/13 at 11am for full GOC Hopefully patient's encephalopathy will continue to improve so she can participate in decision making and GOC discussion PMT will continue to follow and support holistically   Code Status/Advance Care Planning: Full code  Palliative Prophylaxis:  Aspiration, Frequent Pain Assessment, Oral Care, and Turn Reposition  Additional Recommendations (Limitations, Scope, Preferences): Full Scope Treatment  Psycho-social/Spiritual:  Desire for further Chaplaincy support:no Created space and opportunity for patient and family to express thoughts and feelings regarding patient's current medical situation.  Emotional support and therapeutic listening provided.  Prognosis:  Unable to determine  Discharge Planning: To Be Determined      Primary Diagnoses: Present on Admission:  Symptomatic anemia  Thrombocytosis  Iron deficiency anemia   I have reviewed the medical record, interviewed the patient and family, and examined the patient. The following aspects are pertinent.  Past Medical History:  Diagnosis Date   Anemia    Anxiety    Arthritis    Asthma    Blood transfusion without reported diagnosis    Cataract    removed bilateral   COPD (chronic obstructive pulmonary disease) (HCC)    Depression    GERD (gastroesophageal reflux disease)    Hyperlipidemia    Hypertension    Social History   Socioeconomic History   Marital status: Divorced    Spouse name: Not on file   Number of children: Not on file   Years of education: Not on file   Highest education level: Not on file  Occupational History   Not on file  Tobacco Use   Smoking status: Former    Types: Cigarettes   Smokeless tobacco: Never  Vaping Use   Vaping status: Never Used  Substance and Sexual Activity   Alcohol use: No   Drug use: No   Sexual  activity: Not on file  Other Topics Concern   Not on file  Social History Narrative   Not on file   Social Determinants of Health   Financial Resource Strain: Not on file  Food Insecurity: No Food Insecurity (08/19/2022)   Hunger Vital Sign    Worried About Running Out of Food in the Last Year: Never true    Ran Out of Food in the Last Year: Never true  Transportation Needs: No Transportation Needs (08/19/2022)   PRAPARE - Administrator, Civil Service (Medical): No    Lack of Transportation (Non-Medical): No  Physical Activity: Not on file  Stress: Not on file  Social Connections: Not on file   Family History  Problem Relation Age of Onset   Colon cancer Neg Hx    Colon polyps Neg Hx    Crohn's disease Neg Hx    Esophageal cancer Neg Hx    Rectal cancer Neg Hx    Stomach cancer Neg Hx    Ulcerative colitis Neg Hx    Scheduled Meds:  acetaminophen (TYLENOL) oral liquid 160 mg/5 mL  650 mg Per Tube Q6H   arformoterol  15 mcg Nebulization BID   atorvastatin  20 mg Per Tube Daily   budesonide (PULMICORT) nebulizer solution  0.5 mg Nebulization BID   Chlorhexidine Gluconate Cloth  6 each Topical Q0600   feeding supplement (PROSource TF20)  60 mL Per Tube Daily   insulin aspart  0-9 Units Subcutaneous Q4H   multivitamin with minerals  1 tablet Per Tube Daily   pantoprazole (PROTONIX) IV  40 mg Intravenous Q12H   phosphorus  500 mg Oral TID   revefenacin  175 mcg Nebulization Daily   sodium chloride flush  10-40 mL Intracatheter Q12H   thiamine  100 mg Per Tube Daily   Continuous Infusions:  sodium chloride 75 mL/hr at 08/25/22 0000   ceFEPime (MAXIPIME) IV 2 g (08/28/22 0946)   feeding supplement (VITAL 1.5 CAL) Stopped (08/28/22 0039)   heparin 1,400 Units/hr (08/28/22 0855)   metronidazole Stopped (08/28/22 0603)   micafungin (MYCAMINE) 100 mg in sodium chloride 0.9 % 100 mL IVPB 100 mg (08/28/22 0856)   PRN Meds:.albuterol, hydrALAZINE, lip balm, morphine  injection, naLOXone (NARCAN)  injection, ondansetron **OR** ondansetron (ZOFRAN) IV, mouth rinse, oxyCODONE, sodium chloride flush Medications Prior to Admission:  Prior to Admission medications   Medication Sig Start Date End Date Taking? Authorizing Provider  cetirizine (ZYRTEC) 10 MG tablet Take 1 tablet (10 mg total) by mouth daily. Patient taking differently: Take 10 mg by mouth daily as needed for allergies or rhinitis. 10/09/19  Yes Hawks, Christy A, FNP  ferrous sulfate 325 (65 FE) MG EC tablet Take 325 mg by mouth daily.   Yes [provider]  fluticasone (FLONASE) 50 MCG/ACT nasal spray Place 2 sprays into both nostrils daily. Patient taking differently: Place 2 sprays into both nostrils at bedtime as needed for allergies or rhinitis. 10/09/19  Yes Hawks, Christy A, FNP  lisinopril (ZESTRIL) 40 MG tablet Take 40 mg by mouth in the morning.   Yes [provider]  lovastatin (MEVACOR) 20 MG tablet Take 20 mg by mouth at bedtime.   Yes [provider]  PROAIR HFA 108 (90 Base) MCG/ACT inhaler Inhale 2 puffs into the lungs every 6 (six) hours as needed for wheezing or shortness of breath.   Yes [provider]  sertraline (ZOLOFT) 100 MG tablet Take 150 mg by mouth in the morning.   Yes [provider]  SYMBICORT 160-4.5 MCG/ACT inhaler Inhale 2 puffs into the lungs in the morning and at bedtime.   Yes [provider]  furosemide (LASIX) 20 MG tablet Take 1 tablet (20 mg total) by mouth daily. Patient not taking: Reported on 05/13/2022 04/26/22   Arrien, York Ram, MD  predniSONE (STERAPRED UNI-PAK 21 TAB) 10 MG (21) TBPK tablet 6 tabs for 1 day, then 5 tabs for 1 das, then 4 tabs for 1 day, then 3 tabs for 1 day, 2 tabs for 1 day, then 1 tab for 1 day Patient not taking: Reported on 04/24/2022 04/09/19   Janace Aris, NP   Allergies  Allergen Reactions   Aspirin Other (See Comments)    Caused an asthma attack   Ibuprofen Swelling and  Other (See Comments)    Angioedema and Asthma exacerbation   Sulfonamide Derivatives Swelling and Other (See Comments)    Face swelled SEVERELY   Penicillins Rash   Review of Systems  Unable to perform ROS: Other    Physical Exam Vitals and nursing note reviewed.  Constitutional:      General: She is not in acute distress.    Appearance: She is ill-appearing.  Pulmonary:     Effort: No respiratory distress.  Skin:    General: Skin is warm and dry.  Neurological:     Motor: Weakness present.     Comments: Disoriented to year, eyes remained closed  during assessment     Vital Signs: BP 137/65 (BP Location: Left Arm)   Pulse 95   Temp 98.8 F (37.1 C) (Oral)   Resp 18   Ht 5' 5.98" (1.676 m)   Wt 78.9 kg   SpO2 98%   BMI 28.09 kg/m  Pain Scale: 0-10   Pain Score: 0-No pain   SpO2: SpO2: 98 % O2 Device:SpO2: 98 % O2 Flow Rate: .O2 Flow Rate (L/min): 3 L/min  IO: Intake/output summary:  Intake/Output Summary (Last 24 hours) at 08/28/2022 1221 Last data filed at 08/28/2022 1200 Gross per 24 hour  Intake 1186.68 ml  Output 1760 ml  Net -573.32 ml    LBM: Last BM Date : 08/27/22 Baseline Weight: Weight: 77.1 kg Most recent weight: Weight: 78.9 kg     Palliative Assessment/Data: PPS 30% with tube feeds     Time In: 1230 Time Out: 1320 Time Total: 50 minutes  Greater than 50%  of this time was spent counseling and coordinating care related to the above assessment and plan.  Signed by: Haskel Khan, NP   Please contact Palliative Medicine Team phone at [phone] for questions and concerns.  For individual provider: See Amion  *Portions of this note are a verbal dictation therefore any spelling and/or grammatical errors are due to the "[employer]" system interpretation.

## 2022-08-28 NOTE — Progress Notes (Signed)
PROGRESS NOTE  Debra Sharp  ZOX:[id number] DOB: [birth year] DOA: 08/18/2022 PCP: Clayborn Heron, MD   Brief Narrative:  Patient is a 72 year old female with past medical history of hypertension, hyperlipidemia, systolic CHF, thrombocytopenia, COPD, asthma who initially presented with weakness, abdominal pain.  Workup showed anemia concerning for upper GI bleed.  CT abdomen/pelvis showed colonic distention, possible obstruction.  GI consulted, plan was for EGD but she could not tolerate prep.  Also found to have right-sided hydronephrosis, incidental acute stroke on CT/MRI.  Repeat CT abdomen/pelvis on 7/6 showed new intraperitoneal free air with presumed perforation of the colon.  Surgery consulted and she underwent emergent exploratory laparotomy, right hemicolectomy, ileostomy with fistula, biopsy of pelvic mass.  Patient was left intubated and was transferred to ICU for pain management.  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  Palliative  care consulted.  General surgery following.  Currently on tube feeding.  Hospital course also remarkable for finding of acute PE now on heparin drip.  Now extubated and transferred to [facility] service on 7/12.  Plan was for TEE which has been canceled because patient declined.  Palliative care, urology ,surgery ,neurology following  Assessment & Plan:  Principal Problem:   GI bleed Active Problems:   Symptomatic anemia   Thrombocytosis   Iron deficiency anemia   Abnormal CT of the abdomen   Colon distention   History of cardioembolic cerebrovascular accident (CVA)   Silent cerebral infarction (HCC)   Bowel perforation (HCC)   Ventilator dependent (HCC)   Pelvic mass   Change in bowel habits   Abnormal CT scan, gastrointestinal tract   Arterial hypotension   Septic shock (HCC)   Fecal occult blood test positive   Uterine leiomyoma   Hydroureteronephrosis   Sepsis with acute hypoxic respiratory failure and septic shock (HCC)    Encephalopathy acute   Perforation bowel (HCC)   Malnutrition of moderate degree   Cerebrovascular accident (CVA) due to embolism of precerebral artery (HCC)   Acute metabolic encephalopathy   Delirium  Acute hypoxic respiratory failure: Intubated postoperatively.  Currently extubated and on room air.    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  Currently in broad spectrum antibiotics with micafungin, Flagyl, cefepime.  Wound  cultures NGTD.  On tube feed.Speech therapy consulted to advance the diet by mouth  Currently hemodynamically stable.  General surgery following.  Leukocytosis persist.  She is afebrile.  Pelvic mass: Culprit for colonic perforation.  Initially concern for gynecological malignancy with elevated CA125, CA 19-19 but pathology showed small bowel primary.  She will need oncology follow-up at some point.  General surgery recommending arranging outpatient follow-up with oncology before discharge, no need for immediate consultation  Acute metabolic encephalopathy: Likely from sepsis, ICU delirium, CVAs.  Currently alert and oriented.  Continue delirium precaution  Acute CVA: MRI done on 7/4 showed single punctuate enhancement in the right frontoparietal area, no evidence of mets.  EEG done on 7/9 did not show any seizure.  Suspected to be embolic CVA.  Neurology following.  There was plan for TEE but patient has declined.  Plan to start on Plavix at some point.  Continue statin.  PT/OT/SLP following  Acute PE: CT angiogram showed acute PE.  Currently on heparin drip.  No evidence of right heart strain.  AKI/hyperkalemia: Resolved.  Currently kidney  function is stable.  Hydronephrosis secondary to abdominal mass: Urology following.  Repeat ultrasound shows unchanged  right-sided hydronephrosis.  Patient declined ureteral stent  Acute on chronic renal disease anemia/acute blood loss anemia secondary to surgery/chronic GI bleed due to small bowel adenocarcinoma: Monitor hemoglobin.  Transfuse if less than 7.  Currently on PPI.  Currently hemoglobin is stable in the range of 11  Hypertension: Off hypertensive.  Monitor blood pressure  Hyperlipidemia: Currently on Lipitor  Chronic systolic CHF: Echo on 08/18/2020 showed EF of 40 to 45%, global hypokinesis, normal right ventricular function.  Previous echo on March 2024 showed EF of 30 to 35%.  We recommend follow-up with cardiology as an outpatient  Hyperglycemia: Currently on sliding scale insulin.  Goals of care: Multiple comorbidities.  General surgery recommended palliative care consulted for new finding of malignancy, and incomplete restriction.  Palliative care following.  Remains full code.  Goal is to discuss with family       Nutrition Problem: Moderate Malnutrition Etiology: chronic illness    DVT prophylaxis:SCDs Start: 08/18/22 1710     Code Status: Full Code  Family Communication: Called and discussed with the son [name] on phone on 7/12  Patient status:Inpatient  Patient is from :home  Anticipated discharge ZO:[id number]  Estimated DC date:SNF   Consultants: General surgery, PCCM, palliative care, neurology  Procedures: Exploratory laparotomy  Antimicrobials:  Anti-infectives (From admission, onward)    Start     Dose/Rate Route Frequency Ordered Stop   08/23/22 2100  ceFEPIme (MAXIPIME) 2 g in sodium chloride 0.9 % 100 mL IVPB        2 g 200 mL/hr over 30 Minutes Intravenous Every 12 hours 08/23/22 1357     08/23/22 0400  micafungin (MYCAMINE) 100 mg in sodium chloride 0.9 % 100 mL IVPB        100 mg 105 mL/hr over 1 Hours Intravenous Daily 08/23/22 0242     08/22/22 1800  metroNIDAZOLE (FLAGYL) IVPB 500 mg        500 mg 100 mL/hr over 60 Minutes Intravenous Every 12 hours  08/22/22 1711     08/22/22 1800  ceFEPIme (MAXIPIME) 2 g in sodium chloride 0.9 % 100 mL IVPB  Status:  Discontinued        2 g 200 mL/hr over 30 Minutes Intravenous Every 8 hours 08/22/22 1716 08/23/22 1357   08/22/22 1724  metroNIDAZOLE (FLAGYL) 500 MG/100ML IVPB       Note to Pharmacy: [name]: cabinet override      08/22/22 1724 08/23/22 0845   08/19/22 1030  cefTRIAXone (ROCEPHIN) 1 g in sodium chloride 0.9 % 100 mL IVPB  Status:  Discontinued        1 g 200 mL/hr over 30 Minutes Intravenous Every 24 hours 08/19/22 0937 08/22/22 1715       Subjective: Patient seen and examined the bedside today.  Hemodynamically stable.  Lying on bed.  Complains of dry mouth.  Alert and oriented.  Denies any shortness of breath, cough or chest pain.  Appears comfortable, still on tube feed.  Objective: Vitals:   08/27/22 1935 08/27/22 2018 08/27/22 2324 08/28/22 0356  BP:  (!) 141/71 (!) 144/67 (!) 153/63  Pulse: (!) 101 (!) 103 91 90  Resp: 16 18 17 16   Temp:  98.3 F (36.8 C) 98 F (36.7 C) 98.9 F (37.2 C)  TempSrc:  Axillary Axillary Axillary  SpO2: 97% 97% 97% 99%  Weight:    78.9 kg  Height:        Intake/Output Summary (Last 24 hours) at 08/28/2022 0807 Last data filed at 08/28/2022 6644 Gross per 24 hour  Intake 1390.85 ml  Output 2060 ml  Net -669.15 ml   Filed Weights   08/26/22 0448 08/27/22 0320 08/28/22 0356  Weight: 79.4 kg 78.1 kg 78.9 kg    Examination:  General exam: Overall comfortable, not in distress,lying on bed HEENT: PERRL,feeding tube Respiratory system:  no wheezes or crackles  Cardiovascular system: S1 & S2 heard, RRR.  Gastrointestinal system: Abdomen is nondistended, soft, ostomy pouch, left-sided drain, dressing Central nervous system: Alert and oriented Extremities: Bilateral lower extremity pitting edema, no clubbing ,no cyanosis Skin: No rashes, no ulcers,no icterus     Data Reviewed: I have personally reviewed following labs and  imaging studies  CBC: Recent Labs  Lab 08/23/22 1821 08/24/22 0340 08/24/22 0342 08/25/22 0436 08/25/22 1054 08/25/22 1728 08/26/22 0512 08/27/22 0741  WBC 34.5* 34.3*  --  37.2*  --   --  25.7* 19.4*  HGB 7.9* 7.1*   < > 5.8* 6.6* 8.9* 9.0* 10.7*  HCT 27.6* 25.2*   < > 20.0* 21.5* 27.8* 28.5* 33.7*  MCV 81.2 78.8*  --  76.9*  --   --  80.3 79.7*  PLT 414* 372  --  273  --   --  224 229   < > = values in this interval not displayed.   Basic Metabolic Panel: Recent Labs  Lab 08/23/22 1821 08/24/22 0340 08/24/22 0342 08/25/22 0436 08/26/22 0512 08/26/22 1659 08/27/22 0741 08/27/22 1712  NA 137 135 136 135 138  --  138  --   K 6.4* 5.8* 5.9* 4.5 4.1  --  4.1  --   CL 110 109  --  105 105  --  109  --   CO2 18* 20*  --  23 23  --  22  --   GLUCOSE 120* 161*  --  126* 96  --  142*  --   BUN 21 25*  --  31* 34*  --  34*  --   CREATININE 1.27* 1.32*  --  1.26* 1.17*  --  0.99  --   CALCIUM 8.2* 8.1*  --  7.9* 8.2*  --  8.0*  --   MG  --  1.7  --   --  2.1 2.1 2.0 1.9  PHOS  --  3.8  --   --  3.5 2.9 2.6 1.9*     Recent Results (from the past 240 hour(s))  MRSA Next Gen by PCR, Nasal     Status: None   Collection Time: 08/22/22  8:15 PM   Specimen: Nasal Mucosa; Nasal Swab  Result Value Ref Range Status   MRSA by PCR Next Gen NOT DETECTED NOT DETECTED Final    Comment: (NOTE) The GeneXpert MRSA Assay (FDA approved for NASAL specimens only), is one component of a comprehensive MRSA colonization surveillance program. It is not intended to diagnose MRSA infection nor to guide or monitor treatment for MRSA infections. Test performance is not FDA approved in patients less than 32 years old. Performed at [facility], [address] [place], [place] [address]   Culture, blood (Routine X 2) w Reflex to ID Panel     Status: None   Collection Time: 08/22/22  8:52 PM   Specimen: BLOOD  Result Value Ref Range Status   Specimen Description BLOOD  Final   Special  Requests   Final    LINE DRAW BOTTLES DRAWN AEROBIC AND ANAEROBIC Blood Culture adequate volume   Culture   Final    NO GROWTH 5 DAYS Performed at [facility], [address], [place], [place] [address]    Report Status 08/27/2022 FINAL  Final  Culture, blood (Routine X 2) w Reflex to ID Panel     Status: None   Collection Time: 08/22/22  9:08 PM   Specimen: BLOOD  Result Value Ref Range Status   Specimen Description BLOOD  Final   Special Requests   Final    LINE DRAW BOTTLES DRAWN AEROBIC AND ANAEROBIC Blood Culture adequate volume   Culture   Final    NO GROWTH 5 DAYS Performed at [facility], [address] [place], [place] [address]    Report Status 08/27/2022 FINAL  Final     Radiology Studies: CT Angio Chest Pulmonary Embolism (PE) W or WO Contrast  Result Date: 08/27/2022 CLINICAL DATA:  Pulmonary embolism (PE) suspected, high prob Weakness and shortness of breath. EXAM: CT ANGIOGRAPHY CHEST WITH CONTRAST TECHNIQUE: Multidetector CT imaging of the chest was performed using the standard protocol during bolus administration of intravenous contrast. Multiplanar CT image reconstructions and MIPs were obtained to evaluate the vascular anatomy. RADIATION DOSE REDUCTION: This exam was performed according to the departmental dose-optimization program which includes automated exposure control, adjustment of the mA and/or kV according to patient size and/or use of iterative reconstruction technique. CONTRAST:  75mL OMNIPAQUE IOHEXOL 350 MG/ML SOLN COMPARISON:  Radiograph 08/23/2022 FINDINGS: Cardiovascular: Positive for small volume acute pulmonary embolus, involving a subsegmental branch in the right upper lobe, for example series 7, image 99. Small amount of thrombus within right lower lobar pulmonary artery series 7, image 141, extending into the segmental and subsegmental branches. Thromboembolic burden is small to moderate. There is no right heart strain. Moderate calcified  as well as noncalcified atheromatous plaque throughout the thoracic aorta, including rounded plaque in the descending aorta series 5, images 84 and 106. No aortic aneurysm. There are coronary artery calcifications. Heart size upper normal. Suggestion of lipomatous hypertrophy of the interatrial septum. Small hiatal hernia. Mediastinum/Nodes: Enteric tube tip in the stomach. Well-circumscribed fluid density structure in the anterior mediastinum measures 2.1 x 2.1 cm series 5, image 57. Well-defined rounded fat density structures in the left suprahilar region, Hounsfield units of -124, 18 and 7 mm series 5, image 57. This appears to be contiguous with the mediastinal fat. Lungs/Pleura: Small to moderate left and small right pleural effusion. Associated compressive atelectasis. No definite pulmonary infarct. 3-4 mm subpleural right upper lobe nodule has mild surrounding ground-glass, series 6, image 55. No endobronchial debris. Upper Abdomen: Small amount of free fluid in the left upper quadrant. Musculoskeletal: There are no acute or suspicious osseous abnormalities. Review of the MIP images confirms the above findings. IMPRESSION: 1. Positive for acute pulmonary embolus, involving a subsegmental branch in the right upper lobe and right lower lobar pulmonary artery. Thromboembolic burden is small to moderate. No right heart strain. 2. Small to moderate left and small right pleural effusions with compressive atelectasis. 3. Well-circumscribed fluid density structure in the anterior mediastinum measuring 2.1 x 2.1 cm. This may represent a thymic cyst. Recommend MRI for further assessment on an elective basis after resolution of acute event. 4. There is also a well-defined fat density structure in the left suprahilar region which appears to be contiguous with the mediastinal fat. Etiology is uncertain.  5. A 3-4 mm subpleural right upper lobe nodule has mild surrounding ground-glass. This is likely infectious or  inflammatory. Per Fleischner Society Guidelines, no routine follow-up imaging is recommended. These guidelines do not apply to immunocompromised patients and patients with cancer. Follow up in patients with significant comorbidities as clinically warranted. For lung cancer screening, adhere to Lung-RADS guidelines. Reference: Radiology. 2017; 284(1):228-43. 6. Moderate calcified as well as noncalcified atheromatous plaque throughout the thoracic aorta, including rounded plaque in the descending aorta. 7. Aortic Atherosclerosis (ICD10-I70.0). Coronary artery calcifications. Critical Value/emergent results were called by telephone at the time of interpretation on 08/27/2022 at 6:56 PM to provider Karie Fetch , who verbally acknowledged these results. Electronically Signed   By: Narda Rutherford M.D.   On: 08/27/2022 19:02   VAS [place] LOWER EXTREMITY VENOUS (DVT)  Result Date: 08/27/2022  Lower Venous DVT Study Patient Name:  CARLAN HOLLEY  Date of Exam:   08/27/2022 Medical Rec #: [id number]      Accession #:    [id number] Date of Birth: 1950/08/31      Patient Gender: F Patient Age:   24 years Exam Location:  [facility] Procedure:      VAS [place] LOWER EXTREMITY VENOUS (DVT) Referring Phys: Scheryl Marten XU --------------------------------------------------------------------------------  Indications: Hypercoagulable state.  Limitations: Body habitus and poor ultrasound/tissue interface. Comparison Study: No previous exams Performing Technologist: Jody Hill RVT, RDMS  Examination Guidelines: A complete evaluation includes B-mode imaging, spectral Doppler, color Doppler, and power Doppler as needed of all accessible portions of each vessel. Bilateral testing is considered an integral part of a complete examination. Limited examinations for reoccurring indications may be performed as noted. The reflux portion of the exam is performed with the patient in reverse Trendelenburg.   +---------+---------------+---------+-----------+----------+-------------------+ RIGHT    CompressibilityPhasicitySpontaneityPropertiesThrombus Aging      +---------+---------------+---------+-----------+----------+-------------------+ CFV      Full           Yes      Yes                                      +---------+---------------+---------+-----------+----------+-------------------+ SFJ      Full                                                             +---------+---------------+---------+-----------+----------+-------------------+ FV Prox  Full           Yes      Yes                                      +---------+---------------+---------+-----------+----------+-------------------+ FV Mid   Full           Yes      Yes                  Not well visualized +---------+---------------+---------+-----------+----------+-------------------+ FV DistalFull           Yes      Yes                                      +---------+---------------+---------+-----------+----------+-------------------+  PFV                     Yes      Yes                                      +---------+---------------+---------+-----------+----------+-------------------+ POP      Full           Yes      Yes                                      +---------+---------------+---------+-----------+----------+-------------------+ PTV                                                   Not visualized      +---------+---------------+---------+-----------+----------+-------------------+ PERO     Full                                         Not well visualized +---------+---------------+---------+-----------+----------+-------------------+   +---------+---------------+---------+-----------+----------+-------------------+ LEFT     CompressibilityPhasicitySpontaneityPropertiesThrombus Aging      +---------+---------------+---------+-----------+----------+-------------------+  CFV      Full           Yes      Yes                                      +---------+---------------+---------+-----------+----------+-------------------+ SFJ      Full                                                             +---------+---------------+---------+-----------+----------+-------------------+ FV Prox  Full           Yes      Yes                                      +---------+---------------+---------+-----------+----------+-------------------+ FV Mid   Full           Yes      Yes                  Not well visualized +---------+---------------+---------+-----------+----------+-------------------+ FV DistalFull           Yes      Yes                                      +---------+---------------+---------+-----------+----------+-------------------+ PFV                     Yes      Yes                                      +---------+---------------+---------+-----------+----------+-------------------+  POP      Full           Yes      Yes                                      +---------+---------------+---------+-----------+----------+-------------------+ PTV                                                   Not visualized      +---------+---------------+---------+-----------+----------+-------------------+ PERO                                                  Not visualized      +---------+---------------+---------+-----------+----------+-------------------+    Summary: BILATERAL: -No evidence of popliteal cyst, bilaterally. RIGHT: - There is no evidence of deep vein thrombosis in the lower extremity. However, portions of this examination were limited- see technologist comments above.  LEFT: - There is no evidence of deep vein thrombosis in the lower extremity. However, portions of this examination were limited- see technologist comments above.  *See table(s) above for measurements and observations.    Preliminary    US RENAL  Result  Date: 08/27/2022 CLINICAL DATA:  Hydronephrosis EXAM: RENAL / URINARY TRACT ULTRASOUND COMPLETE COMPARISON:  CT abdomen pelvis 08/22/2022 FINDINGS: Right Kidney: Renal measurements: 11.1 x 5.8 x 5.4 cm = volume: 181 mL. Diffusely thinned cortex. Unchanged severe hydronephrosis. Left Kidney: Renal measurements: 11.9 x 8.4 x 5.4 cm = volume: 16 mL. Echogenicity within normal limits. No mass or hydronephrosis visualized. Bladder: Not visualized. Other: None. IMPRESSION: Unchanged severe right hydronephrosis. Electronically Signed   By: Acquanetta Belling M.D.   On: 08/27/2022 12:00   CT ANGIO HEAD NECK W WO CM  Result Date: 08/26/2022 CLINICAL DATA:  Follow-up embolic strokes EXAM: CT ANGIOGRAPHY HEAD AND NECK WITH AND WITHOUT CONTRAST TECHNIQUE: Multidetector CT imaging of the head and neck was performed using the standard protocol during bolus administration of intravenous contrast. Multiplanar CT image reconstructions and MIPs were obtained to evaluate the vascular anatomy. Carotid stenosis measurements (when applicable) are obtained utilizing NASCET criteria, using the distal internal carotid diameter as the denominator. RADIATION DOSE REDUCTION: This exam was performed according to the departmental dose-optimization program which includes automated exposure control, adjustment of the mA and/or kV according to patient size and/or use of iterative reconstruction technique. 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 No evidence of progression or hemorrhage. No hydrocephalus or shift. Vascular: See below Skull: No acute or aggressive Sinuses/Orbits: Finding sinonasal polyps. Chronic left sphenoid sinusitis with sclerotic wall thickening. Review of the MIP images confirms the above findings CTA NECK FINDINGS Aortic arch: Atheromatous plaque  with 3 vessel branching. Right carotid system: Mainly calcified atherosclerosis at the bifurcation without stenosis or ulceration. Left carotid system: Mainly calcified atherosclerosis at the bifurcation without stenosis or ulceration. 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 The left vertebral artery is dominant. At the proximal  right V2 segment there is hazy hypoenhancement of the right vertebral lumen. No ulceration, beading, or dissection flap. Robust and symmetric flow by the V4 segments. Skeleton: Generalized cervical spine degeneration. Other neck: No acute finding Upper chest: Layering pleural effusions. Left IJ line with tip at the SVC. An enteric tube traverses the esophagus. Review of the MIP images confirms the above findings CTA HEAD FINDINGS Anterior circulation: Atheromatous plaque is mild. No major branch occlusion, beading, or irregularity. Hypoplastic right A1 segment. Negative for aneurysm. Posterior circulation: Left dominant vertebral artery. The vertebral and basilar arteries are smoothly contoured and diffusely patent. Mild atheromatous irregularity of the posterior cerebral arteries. Venous sinuses: Unremarkable Anatomic variants: None significant Review of the MIP images confirms the above findings IMPRESSION: 1. No emergent arterial finding. 2. 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 3. Short segment of right V2 hypoenhancement without discrete underlying stenosis or dissection flap. 4. No flow limiting stenosis in the anterior circulation. Electronically Signed   By: Tiburcio Pea M.D.   On: 08/26/2022 20:24   DG Abd Portable 1V  Result Date: 08/26/2022 CLINICAL DATA:  Feeding tube placement. EXAM: PORTABLE ABDOMEN - 1 VIEW COMPARISON:  August 25, 2022. FINDINGS: Distal tip of feeding tube is seen in expected position of distal stomach. IMPRESSION: Distal tip of feeding tube is seen in  expected position of distal stomach. Electronically Signed   By: Lupita Raider M.D.   On: 08/26/2022 13:34   MR BRAIN WO CONTRAST  Result Date: 08/26/2022 CLINICAL DATA:  Neuro deficit, acute, stroke suspected neuro change, code stroke at 1830. EXAM: MRI HEAD WITHOUT CONTRAST TECHNIQUE: Multiplanar, multiecho pulse sequences of the brain and surrounding structures were obtained without intravenous contrast. COMPARISON:  Head CT 08/24/2022.  MRI brain 08/20/2022, 08/18/2022. FINDINGS: Brain: Cortical restricted diffusion along the right-greater-than-left superior frontal sulci, as well as in the right parietal lobe and right occipital lobe, with associated edema and partial sulcal effacement, consistent with late acute infarction. Few foci of acute infarction in the left occipital lobe and left cerebellar hemisphere. No acute hemorrhage or significant mass effect. No hydrocephalus or extra-axial collection. Vascular: Normal flow voids. Skull and upper cervical spine: Normal marrow signal. Sinuses/Orbits: Unremarkable. Other: None. IMPRESSION: 1. 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 2. No acute hemorrhage or significant mass effect. Electronically Signed   By: Orvan Falconer M.D.   On: 08/26/2022 12:53    Scheduled Meds:  acetaminophen (TYLENOL) oral liquid 160 mg/5 mL  650 mg Per Tube Q6H   arformoterol  15 mcg Nebulization BID   atorvastatin  20 mg Per Tube Daily   budesonide (PULMICORT) nebulizer solution  0.5 mg Nebulization BID   Chlorhexidine Gluconate Cloth  6 each Topical Q0600   feeding supplement (PROSource TF20)  60 mL Per Tube Daily   insulin aspart  0-9 Units Subcutaneous Q4H   multivitamin with minerals  1 tablet Per Tube Daily   pantoprazole (PROTONIX) IV  40 mg Intravenous Q12H   revefenacin  175 mcg Nebulization Daily   sodium chloride flush  10-40 mL Intracatheter Q12H   thiamine  100 mg Per Tube Daily    Continuous Infusions:  sodium chloride 75 mL/hr at 08/25/22 0000   ceFEPime (MAXIPIME) IV Stopped (08/27/22 2228)   dextrose 10 mL/hr at 08/28/22 0041   feeding supplement (VITAL 1.5 CAL) Stopped (08/28/22 0039)   heparin 1,200 Units/hr (08/27/22 1945)   metronidazole Stopped (08/28/22 0603)  micafungin (MYCAMINE) 100 mg in sodium chloride 0.9 % 100 mL IVPB Stopped (08/27/22 0844)     LOS: 10 days   Burnadette Pop, MD [facility] P7/01/2023, 8:07 AM

## 2022-08-28 NOTE — Progress Notes (Signed)
In and Out cath done 850 ml amber urine. Patient tol. Well. Assisted by Mechele Collin.T.

## 2022-08-28 NOTE — Progress Notes (Signed)
Speech Language Pathology Treatment: Dysphagia  Patient Details Name: Debra Sharp MRN: [id number] DOB: 1950/06/08 Today's Date: 08/28/2022 Time: 7829-5621 SLP Time Calculation (min) (ACUTE ONLY): 16 min  Assessment / Plan / Recommendation Clinical Impression  Pt shows small improvements in her alertness and overall engagement with POs today, keeping her eyes open more and initiating intake with Min cues. She still has some oral holding, but she says this is volitional. Immediate coughing is noted when she tries to take large, consecutive sips via straw. With help from SLP for pacing, she shows no further overt s/s of aspiration. Recommend considering initiation of POs with full supervision - Dr. Janee Morn is okay with a clear liquid diet. Would provide full supervision with intake as mentation allows.   HPI HPI: 72 yo female admitted 7/2 with generalized weakness, thrombocytosis and anemia. 7/2 MRI scattered punctate ischemic infarcts right parietal, left frontal, right cerebellum. Pt with progression to AKI with septic peritonitis. 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 7/8 extubated. Lt sided weakness noted post extubation with CT demonstrating possible Rt parietal infarct. PMHx: COPD, GERD, anemia, CHF, HLD, HTN      SLP Plan  Continue with current plan of care      Recommendations for follow up therapy are one component of a multi-disciplinary discharge planning process, led by the attending physician.  Recommendations may be updated based on patient status, additional functional criteria and insurance authorization.    Recommendations  Diet recommendations: Thin liquid Liquids provided via: Cup;Straw Medication Administration: Via alternative means Supervision: Staff to assist with self feeding;Full supervision/cueing for compensatory strategies Compensations: Minimize environmental distractions;Slow  rate;Small sips/bites Postural Changes and/or Swallow Maneuvers: Seated upright 90 degrees;Upright 30-60 min after meal                  Oral care QID;Oral care prior to ice chip/H20   Frequent or constant Supervision/Assistance Dysphagia, unspecified (R13.10)     Continue with current plan of care     Mahala Menghini., M.A. CCC-SLP Acute Rehabilitation Services Office [phone]  Secure chat preferred   08/28/2022, 3:47 PM

## 2022-08-28 NOTE — Progress Notes (Addendum)
Patient vomited green bile color fluid approx 100 ml  after turning her . I was in room when she did it.  Zofran 4 mg IV given.

## 2022-08-28 NOTE — Progress Notes (Signed)
   Attempted to consent patient for TEE this AM. I explained the indications for the procedure and why we are recommending she have it done. Also explained the general process of TEE. Patient refused to have TEE.  I notified hospitalist and neurology that patient refused the procedure. No plans to reschedule at this time.   Jonita Albee, PA-C 08/28/2022 7:45 AM

## 2022-08-28 NOTE — Progress Notes (Addendum)
eLink Physician-Brief Progress Note Patient Name: Debra Sharp DOB: 16-Nov-1950 MRN: [id number]   Date of Service  08/28/2022  HPI/Events of Note  72 year old man is n.p.o. for TEE tomorrow.  History of hypoglycemic episodes.  Tube feeds stopped.  eICU Interventions  Low-dose D10 drip added.   4098 - urinary retention, in and out cath. Defer abdominal wound management to the surgical team.  Abdominal wound that is open w/o dressing change orders.   Intervention Category Minor Interventions: Routine modifications to care plan (e.g. PRN medications for pain, fever)  Thomasena Vandenheuvel 08/28/2022, 12:34 AM

## 2022-08-28 NOTE — Progress Notes (Signed)
Patient ID: Tamarah Halley, female   DOB: 1951/02/02, 72 y.o.   MRN: [id number] 6 Days Post-Op    Subjective: Reports she does not like the purewick, pain control OK ROS negative except as listed above. Objective: Vital signs in last 24 hours: Temp:  [98 F (36.7 C)-98.9 F (37.2 C)] 98.8 F (37.1 C) (07/12 0800) Pulse Rate:  [84-132] 84 (07/12 0800) Resp:  [15-21] 18 (07/12 0800) BP: (118-153)/(58-73) 148/65 (07/12 0800) SpO2:  [94 %-99 %] 98 % (07/12 0800) Weight:  [78.9 kg] 78.9 kg (07/12 0356) Last BM Date : 08/27/22  Intake/Output from previous day: 07/11 0701 - 07/12 0700 In: 1390.9 [I.V.:195.9; NG/GT:740; IV Piggyback:455] Out: 2110 [Urine:1600; Drains:160; Stool:350] Intake/Output this shift: No intake/output data recorded.  General appearance: cooperative GI: soft, wound OK with WTD, ostomy with output into pouch RUE edema  Lab Results: CBC  Recent Labs    08/27/22 0741 08/28/22 0753  WBC 19.4* 19.9*  HGB 10.7* 11.9*  HCT 33.7* 38.8  PLT 229 245   BMET Recent Labs    08/27/22 0741 08/28/22 0753  NA 138 139  K 4.1 4.0  CL 109 110  CO2 22 20*  GLUCOSE 142* 101*  BUN 34* 34*  CREATININE 0.99 0.94  CALCIUM 8.0* 7.9*   PT/INR No results for input(s): "LABPROT", "INR" in the last 72 hours. ABG No results for input(s): "PHART", "HCO3" in the last 72 hours.  Invalid input(s): "PCO2", "PO2"  Studies/Results: CT Angio Chest Pulmonary Embolism (PE) W or WO Contrast  Result Date: 08/27/2022 CLINICAL DATA:  Pulmonary embolism (PE) suspected, high prob Weakness and shortness of breath. EXAM: CT ANGIOGRAPHY CHEST WITH CONTRAST TECHNIQUE: Multidetector CT imaging of the chest was performed using the standard protocol during bolus administration of intravenous contrast. Multiplanar CT image reconstructions and MIPs were obtained to evaluate the vascular anatomy. RADIATION DOSE REDUCTION: This exam was performed according to the departmental dose-optimization  program which includes automated exposure control, adjustment of the mA and/or kV according to patient size and/or use of iterative reconstruction technique. CONTRAST:  75mL OMNIPAQUE IOHEXOL 350 MG/ML SOLN COMPARISON:  Radiograph 08/23/2022 FINDINGS: Cardiovascular: Positive for small volume acute pulmonary embolus, involving a subsegmental branch in the right upper lobe, for example series 7, image 99. Small amount of thrombus within right lower lobar pulmonary artery series 7, image 141, extending into the segmental and subsegmental branches. Thromboembolic burden is small to moderate. There is no right heart strain. Moderate calcified as well as noncalcified atheromatous plaque throughout the thoracic aorta, including rounded plaque in the descending aorta series 5, images 84 and 106. No aortic aneurysm. There are coronary artery calcifications. Heart size upper normal. Suggestion of lipomatous hypertrophy of the interatrial septum. Small hiatal hernia. Mediastinum/Nodes: Enteric tube tip in the stomach. Well-circumscribed fluid density structure in the anterior mediastinum measures 2.1 x 2.1 cm series 5, image 57. Well-defined rounded fat density structures in the left suprahilar region, Hounsfield units of -124, 18 and 7 mm series 5, image 57. This appears to be contiguous with the mediastinal fat. Lungs/Pleura: Small to moderate left and small right pleural effusion. Associated compressive atelectasis. No definite pulmonary infarct. 3-4 mm subpleural right upper lobe nodule has mild surrounding ground-glass, series 6, image 55. No endobronchial debris. Upper Abdomen: Small amount of free fluid in the left upper quadrant. Musculoskeletal: There are no acute or suspicious osseous abnormalities. Review of the MIP images confirms the above findings. IMPRESSION: 1. Positive for acute pulmonary embolus, involving a subsegmental  branch in the right upper lobe and right lower lobar pulmonary artery. Thromboembolic  burden is small to moderate. No right heart strain. 2. Small to moderate left and small right pleural effusions with compressive atelectasis. 3. Well-circumscribed fluid density structure in the anterior mediastinum measuring 2.1 x 2.1 cm. This may represent a thymic cyst. Recommend MRI for further assessment on an elective basis after resolution of acute event. 4. There is also a well-defined fat density structure in the left suprahilar region which appears to be contiguous with the mediastinal fat. Etiology is uncertain. 5. A 3-4 mm subpleural right upper lobe nodule has mild surrounding ground-glass. This is likely infectious or inflammatory. Per Fleischner Society Guidelines, no routine follow-up imaging is recommended. These guidelines do not apply to immunocompromised patients and patients with cancer. Follow up in patients with significant comorbidities as clinically warranted. For lung cancer screening, adhere to Lung-RADS guidelines. Reference: Radiology. 2017; 284(1):228-43. 6. Moderate calcified as well as noncalcified atheromatous plaque throughout the thoracic aorta, including rounded plaque in the descending aorta. 7. Aortic Atherosclerosis (ICD10-I70.0). Coronary artery calcifications. Critical Value/emergent results were called by telephone at the time of interpretation on 08/27/2022 at 6:56 PM to provider Karie Fetch , who verbally acknowledged these results. Electronically Signed   By: Narda Rutherford M.D.   On: 08/27/2022 19:02   VAS [place] LOWER EXTREMITY VENOUS (DVT)  Result Date: 08/27/2022  Lower Venous DVT Study Patient Name:  LYBERTI AFSHAR  Date of Exam:   08/27/2022 Medical Rec #: [id number]      Accession #:    [id number] Date of Birth: 12-05-1950      Patient Gender: F Patient Age:   21 years Exam Location:  [facility] Procedure:      VAS [place] LOWER EXTREMITY VENOUS (DVT) Referring Phys: Scheryl Marten XU --------------------------------------------------------------------------------   Indications: Hypercoagulable state.  Limitations: Body habitus and poor ultrasound/tissue interface. Comparison Study: No previous exams Performing Technologist: Jody Hill RVT, RDMS  Examination Guidelines: A complete evaluation includes B-mode imaging, spectral Doppler, color Doppler, and power Doppler as needed of all accessible portions of each vessel. Bilateral testing is considered an integral part of a complete examination. Limited examinations for reoccurring indications may be performed as noted. The reflux portion of the exam is performed with the patient in reverse Trendelenburg.  +---------+---------------+---------+-----------+----------+-------------------+ RIGHT    CompressibilityPhasicitySpontaneityPropertiesThrombus Aging      +---------+---------------+---------+-----------+----------+-------------------+ CFV      Full           Yes      Yes                                      +---------+---------------+---------+-----------+----------+-------------------+ SFJ      Full                                                             +---------+---------------+---------+-----------+----------+-------------------+ FV Prox  Full           Yes      Yes                                      +---------+---------------+---------+-----------+----------+-------------------+ FV  Mid   Full           Yes      Yes                  Not well visualized +---------+---------------+---------+-----------+----------+-------------------+ FV DistalFull           Yes      Yes                                      +---------+---------------+---------+-----------+----------+-------------------+ PFV                     Yes      Yes                                      +---------+---------------+---------+-----------+----------+-------------------+ POP      Full           Yes      Yes                                       +---------+---------------+---------+-----------+----------+-------------------+ PTV                                                   Not visualized      +---------+---------------+---------+-----------+----------+-------------------+ PERO     Full                                         Not well visualized +---------+---------------+---------+-----------+----------+-------------------+   +---------+---------------+---------+-----------+----------+-------------------+ LEFT     CompressibilityPhasicitySpontaneityPropertiesThrombus Aging      +---------+---------------+---------+-----------+----------+-------------------+ CFV      Full           Yes      Yes                                      +---------+---------------+---------+-----------+----------+-------------------+ SFJ      Full                                                             +---------+---------------+---------+-----------+----------+-------------------+ FV Prox  Full           Yes      Yes                                      +---------+---------------+---------+-----------+----------+-------------------+ FV Mid   Full           Yes      Yes                  Not well visualized +---------+---------------+---------+-----------+----------+-------------------+ FV DistalFull           Yes  Yes                                      +---------+---------------+---------+-----------+----------+-------------------+ PFV                     Yes      Yes                                      +---------+---------------+---------+-----------+----------+-------------------+ POP      Full           Yes      Yes                                      +---------+---------------+---------+-----------+----------+-------------------+ PTV                                                   Not visualized      +---------+---------------+---------+-----------+----------+-------------------+  PERO                                                  Not visualized      +---------+---------------+---------+-----------+----------+-------------------+    Summary: BILATERAL: -No evidence of popliteal cyst, bilaterally. RIGHT: - There is no evidence of deep vein thrombosis in the lower extremity. However, portions of this examination were limited- see technologist comments above.  LEFT: - There is no evidence of deep vein thrombosis in the lower extremity. However, portions of this examination were limited- see technologist comments above.  *See table(s) above for measurements and observations.    Preliminary    US RENAL  Result Date: 08/27/2022 CLINICAL DATA:  Hydronephrosis EXAM: RENAL / URINARY TRACT ULTRASOUND COMPLETE COMPARISON:  CT abdomen pelvis 08/22/2022 FINDINGS: Right Kidney: Renal measurements: 11.1 x 5.8 x 5.4 cm = volume: 181 mL. Diffusely thinned cortex. Unchanged severe hydronephrosis. Left Kidney: Renal measurements: 11.9 x 8.4 x 5.4 cm = volume: 16 mL. Echogenicity within normal limits. No mass or hydronephrosis visualized. Bladder: Not visualized. Other: None. IMPRESSION: Unchanged severe right hydronephrosis. Electronically Signed   By: Acquanetta Belling M.D.   On: 08/27/2022 12:00   CT ANGIO HEAD NECK W WO CM  Result Date: 08/26/2022 CLINICAL DATA:  Follow-up embolic strokes EXAM: CT ANGIOGRAPHY HEAD AND NECK WITH AND WITHOUT CONTRAST TECHNIQUE: Multidetector CT imaging of the head and neck was performed using the standard protocol during bolus administration of intravenous contrast. Multiplanar CT image reconstructions and MIPs were obtained to evaluate the vascular anatomy. Carotid stenosis measurements (when applicable) are obtained utilizing NASCET criteria, using the distal internal carotid diameter as the denominator. RADIATION DOSE REDUCTION: This exam was performed according to the departmental dose-optimization program which includes automated exposure control, adjustment  of the mA and/or kV according to patient size and/or use of iterative reconstruction technique. CONTRAST:  75mL OMNIPAQUE IOHEXOL 350 MG/ML SOLN COMPARISON:  Brain MRI from earlier today FINDINGS: CT HEAD FINDINGS Brain: Multiple acute infarcts in the infra and supratentorial brain as  detected on preceding brain MRI. No evidence of progression or hemorrhage. No hydrocephalus or shift. Vascular: See below Skull: No acute or aggressive Sinuses/Orbits: Finding sinonasal polyps. Chronic left sphenoid sinusitis with sclerotic wall thickening. Review of the MIP images confirms the above findings CTA NECK FINDINGS Aortic arch: Atheromatous plaque with 3 vessel branching. Right carotid system: Mainly calcified atherosclerosis at the bifurcation without stenosis or ulceration. Left carotid system: Mainly calcified atherosclerosis at the bifurcation without stenosis or ulceration. 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 The left vertebral artery is dominant. At the proximal right V2 segment there is hazy hypoenhancement of the right vertebral lumen. No ulceration, beading, or dissection flap. Robust and symmetric flow by the V4 segments. Skeleton: Generalized cervical spine degeneration. Other neck: No acute finding Upper chest: Layering pleural effusions. Left IJ line with tip at the SVC. An enteric tube traverses the esophagus. Review of the MIP images confirms the above findings CTA HEAD FINDINGS Anterior circulation: Atheromatous plaque is mild. No major branch occlusion, beading, or irregularity. Hypoplastic right A1 segment. Negative for aneurysm. Posterior circulation: Left dominant vertebral artery. The vertebral and basilar arteries are smoothly contoured and diffusely patent. Mild atheromatous irregularity of the posterior cerebral arteries. Venous sinuses: Unremarkable Anatomic variants: None significant Review of the MIP images confirms the  above findings IMPRESSION: 1. No emergent arterial finding. 2. 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 3. Short segment of right V2 hypoenhancement without discrete underlying stenosis or dissection flap. 4. No flow limiting stenosis in the anterior circulation. Electronically Signed   By: Tiburcio Pea M.D.   On: 08/26/2022 20:24   DG Abd Portable 1V  Result Date: 08/26/2022 CLINICAL DATA:  Feeding tube placement. EXAM: PORTABLE ABDOMEN - 1 VIEW COMPARISON:  August 25, 2022. FINDINGS: Distal tip of feeding tube is seen in expected position of distal stomach. IMPRESSION: Distal tip of feeding tube is seen in expected position of distal stomach. Electronically Signed   By: Lupita Raider M.D.   On: 08/26/2022 13:34   MR BRAIN WO CONTRAST  Result Date: 08/26/2022 CLINICAL DATA:  Neuro deficit, acute, stroke suspected neuro change, code stroke at 1830. EXAM: MRI HEAD WITHOUT CONTRAST TECHNIQUE: Multiplanar, multiecho pulse sequences of the brain and surrounding structures were obtained without intravenous contrast. COMPARISON:  Head CT 08/24/2022.  MRI brain 08/20/2022, 08/18/2022. FINDINGS: Brain: Cortical restricted diffusion along the right-greater-than-left superior frontal sulci, as well as in the right parietal lobe and right occipital lobe, with associated edema and partial sulcal effacement, consistent with late acute infarction. Few foci of acute infarction in the left occipital lobe and left cerebellar hemisphere. No acute hemorrhage or significant mass effect. No hydrocephalus or extra-axial collection. Vascular: Normal flow voids. Skull and upper cervical spine: Normal marrow signal. Sinuses/Orbits: Unremarkable. Other: None. IMPRESSION: 1. 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 2. No acute hemorrhage or significant mass effect. Electronically Signed   By: Orvan Falconer M.D.   On: 08/26/2022 12:53     Anti-infectives: Anti-infectives (From admission, onward)    Start     Dose/Rate Route Frequency Ordered Stop   08/23/22 2100  ceFEPIme (MAXIPIME) 2 g in sodium chloride 0.9 % 100 mL IVPB        2 g 200 mL/hr over 30 Minutes Intravenous Every 12 hours 08/23/22 1357     08/23/22 0400  micafungin (MYCAMINE) 100 mg in sodium chloride  0.9 % 100 mL IVPB        100 mg 105 mL/hr over 1 Hours Intravenous Daily 08/23/22 0242     08/22/22 1800  metroNIDAZOLE (FLAGYL) IVPB 500 mg        500 mg 100 mL/hr over 60 Minutes Intravenous Every 12 hours 08/22/22 1711     08/22/22 1800  ceFEPIme (MAXIPIME) 2 g in sodium chloride 0.9 % 100 mL IVPB  Status:  Discontinued        2 g 200 mL/hr over 30 Minutes Intravenous Every 8 hours 08/22/22 1716 08/23/22 1357   08/22/22 1724  metroNIDAZOLE (FLAGYL) 500 MG/100ML IVPB       Note to Pharmacy: [name]: cabinet override      08/22/22 1724 08/23/22 0845   08/19/22 1030  cefTRIAXone (ROCEPHIN) 1 g in sodium chloride 0.9 % 100 mL IVPB  Status:  Discontinued        1 g 200 mL/hr over 30 Minutes Intravenous Every 24 hours 08/19/22 0937 08/22/22 1715       Assessment/Plan:  Colonic perforation secondary to obstructing pelvic mass   S/P exploratory laparotomy, right hemicolectomy, ileostomy, mucus fistula, biopsy of pelvic mass by Dr. 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 - BID wet to dry to midline - Maxipime and Flagyl  New stroke - seen on MRI 7/10, suspected thromboembolic, defer to primary team/stroke tem for further w/u and management PE - heparin drip per primary  FEN: NPO, TF-okay to advance to goal as tolerated, noted recent stroke, recommend SLP prior to PO intake (ordered) VTE: ok  for LMWH from CCS standpoint, recommend  40mg  daily ID: cefepime/flagyl/micafungin, monitor WBC and drain output Foley: removed, poor documentation of UOP   Dispo - therapies   LOS: 10 days    Violeta Gelinas, MD, MPH, FACS Trauma & General Surgery Use [email or web address] to contact on call provider  08/28/2022

## 2022-08-28 NOTE — Progress Notes (Signed)
Physical Therapy Treatment Patient Details Name: Debra Sharp MRN: [id number] DOB: Aug 10, 1950 Today's Date: 08/28/2022   History of Present Illness 72 yo female admitted 7/2 with generalized weakness, thrombocytosis and anemia. 7/2 MRI scattered punctate ischemic infarcts right parietal, left frontal, right cerebellum. Pt with progression to AKI with septic peritonitis. 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 7/8 extubated. Lt sided weakness noted post extubation with CT demonstrating possible Rt parietal infarct. PMHx: COPD, anemia, CHF, HLD, HTN    PT Comments  Pt received in supine and agreeable to session with encouragement. Pt able to perform therex with RLE. Pt continues to require max A +2-total A +2 for bed mobility due to weakness. Once sitting pt crying out in pain and saying "help me", but pt is unable to specify where the pain is. Pt requires max A to maintain sitting balance due to L and posterior lean and limited UE support. Pt is able to tolerate sitting EOB for ~10 mins with encouragement for nursing to wash her back and change her gown. Pt continues to benefit from PT services to progress toward functional mobility goals.     Assistance Recommended at Discharge Frequent or constant Supervision/Assistance  If plan is discharge home, recommend the following:  Can travel by private vehicle    Two people to help with walking and/or transfers;Two people to help with bathing/dressing/bathroom;Assistance with cooking/housework;Assistance with feeding;Direct supervision/assist for medications management;Assist for transportation;Direct supervision/assist for financial management   No  Equipment Recommendations  Wheelchair (measurements PT);Hospital bed;Wheelchair cushion (measurements PT);Other (comment)    Recommendations for Other Services       Precautions / Restrictions Precautions Precautions:  Fall;Other (comment) Precaution Comments: cortrak, ostomy, Rt fem line Restrictions Weight Bearing Restrictions: No     Mobility  Bed Mobility Overal bed mobility: Needs Assistance Bed Mobility: Supine to Sit, Sit to Supine     Supine to sit: Max assist, +2 for physical assistance Sit to supine: Total assist, +2 for physical assistance   General bed mobility comments: Pt able to advance RLE towards the EOB, however required max A +2-total A +2 for all other aspects of mobility. Pt requiring max A for sitting balance due to L lateral lean despite cues.    Transfers                   General transfer comment: unable         Balance Overall balance assessment: Needs assistance Sitting-balance support: Bilateral upper extremity supported, Feet supported Sitting balance-Leahy Scale: Zero Sitting balance - Comments: Pt requiring constant max A to maintain sitting balance due to L lateral and posterior lean. Pt's BUE placed on bed, but pt with limited ability to use them for support.                                    Cognition Arousal/Alertness: Lethargic Behavior During Therapy: Restless, Flat affect Overall Cognitive Status: Impaired/Different from baseline                                 General Comments: Pt with eyes closed for most of session unless cued to open them. Pt responding to questions with 1-2 word answers and becoming agitated with increased movement.        Exercises General Exercises -  Lower Extremity Ankle Circles/Pumps: AROM, Supine, Both, 5 reps Heel Slides: AROM, Right, 5 reps, PROM, Left, Supine (PROM LLE)    General Comments        Pertinent Vitals/Pain Pain Assessment Pain Assessment: Faces Faces Pain Scale: Hurts even more Pain Location: generalized with mobility Pain Descriptors / Indicators: Discomfort, Grimacing, Moaning, Crying Pain Intervention(s): Monitored during session, Repositioned     PT  Goals (current goals can now be found in the care plan section) Acute Rehab PT Goals PT Goal Formulation: Patient unable to participate in goal setting Time For Goal Achievement: 09/08/22 Potential to Achieve Goals: Fair Progress towards PT goals: Progressing toward goals (slowly)    Frequency    Min 2X/week      PT Plan Current plan remains appropriate       AM-PAC PT "6 Clicks" Mobility   Outcome Measure  Help needed turning from your back to your side while in a flat bed without using bedrails?: Total Help needed moving from lying on your back to sitting on the side of a flat bed without using bedrails?: Total Help needed moving to and from a bed to a chair (including a wheelchair)?: Total Help needed standing up from a chair using your arms (e.g., wheelchair or bedside chair)?: Total Help needed to walk in hospital room?: Total Help needed climbing 3-5 steps with a railing? : Total 6 Click Score: 6    End of Session   Activity Tolerance: Patient limited by fatigue;Patient limited by pain Patient left: in bed;with call bell/phone within reach;with nursing/sitter in room Nurse Communication: Mobility status PT Visit Diagnosis: Other abnormalities of gait and mobility (R26.89);Muscle weakness (generalized) (M62.81);Other symptoms and signs involving the nervous system (R29.898);Hemiplegia and hemiparesis Hemiplegia - Right/Left: Left Hemiplegia - dominant/non-dominant: Non-dominant Hemiplegia - caused by: Cerebral infarction     Time: 4098-1191 PT Time Calculation (min) (ACUTE ONLY): 24 min  Charges:    $Therapeutic Activity: 23-37 mins PT General Charges $$ ACUTE PT VISIT: 1 Visit                     Johny Shock, PTA Acute Rehabilitation Services Secure Chat Preferred  [phone]    Johny Shock 08/28/2022, 12:29 PM

## 2022-08-28 NOTE — Progress Notes (Addendum)
ANTICOAGULATION CONSULT NOTE - Follow-Up Consult  Pharmacy Consult for heparin  Indication: pulmonary embolus  Allergies  Allergen Reactions   Aspirin Other (See Comments)    Caused an asthma attack   Ibuprofen Swelling and Other (See Comments)    Angioedema and Asthma exacerbation   Sulfonamide Derivatives Swelling and Other (See Comments)    Face swelled SEVERELY   Penicillins Rash    Patient Measurements: Height: 5' 5.98" (167.6 cm) Weight: 78.9 kg (173 lb 15.1 oz) IBW/kg (Calculated) : 59.26 Heparin Dosing Weight: 75kg  Vital Signs: Temp: 98.2 F (36.8 C) (07/12 1955) Temp Source: Oral (07/12 1955) BP: 134/66 (07/12 1955) Pulse Rate: 94 (07/12 1955)  Labs: Recent Labs    08/26/22 0512 08/27/22 0741 08/28/22 0753 08/28/22 1819  HGB 9.0* 10.7* 11.9*  --   HCT 28.5* 33.7* 38.8  --   PLT 224 229 245  --   HEPARINUNFRC  --   --  <0.10* 0.13*  CREATININE 1.17* 0.99 0.94  --     Estimated Creatinine Clearance: 59 mL/min (by C-G formula based on SCr of 0.94 mg/dL).   Medical History: Past Medical History:  Diagnosis Date   Anemia    Anxiety    Arthritis    Asthma    Blood transfusion without reported diagnosis    Cataract    removed bilateral   COPD (chronic obstructive pulmonary disease) (HCC)    Depression    GERD (gastroesophageal reflux disease)    Hyperlipidemia    Hypertension     Medications:  Medications Prior to Admission  Medication Sig Dispense Refill Last Dose   cetirizine (ZYRTEC) 10 MG tablet Take 1 tablet (10 mg total) by mouth daily. (Patient taking differently: Take 10 mg by mouth daily as needed for allergies or rhinitis.) 30 tablet 11 08/17/2022   ferrous sulfate 325 (65 FE) MG EC tablet Take 325 mg by mouth daily.   08/17/2022   fluticasone (FLONASE) 50 MCG/ACT nasal spray Place 2 sprays into both nostrils daily. (Patient taking differently: Place 2 sprays into both nostrils at bedtime as needed for allergies or rhinitis.) 16 g 6  08/16/2022   lisinopril (ZESTRIL) 40 MG tablet Take 40 mg by mouth in the morning.   08/17/2022   lovastatin (MEVACOR) 20 MG tablet Take 20 mg by mouth at bedtime.   08/17/2022   PROAIR HFA 108 (90 Base) MCG/ACT inhaler Inhale 2 puffs into the lungs every 6 (six) hours as needed for wheezing or shortness of breath.   08/17/2022   sertraline (ZOLOFT) 100 MG tablet Take 150 mg by mouth in the morning.   08/17/2022   SYMBICORT 160-4.5 MCG/ACT inhaler Inhale 2 puffs into the lungs in the morning and at bedtime.   08/17/2022   furosemide (LASIX) 20 MG tablet Take 1 tablet (20 mg total) by mouth daily. (Patient not taking: Reported on 05/13/2022) 10 tablet 0 Not Taking   predniSONE (STERAPRED UNI-PAK 21 TAB) 10 MG (21) TBPK tablet 6 tabs for 1 day, then 5 tabs for 1 das, then 4 tabs for 1 day, then 3 tabs for 1 day, 2 tabs for 1 day, then 1 tab for 1 day (Patient not taking: Reported on 04/24/2022) 21 tablet 0 Not Taking   Scheduled:   acetaminophen (TYLENOL) oral liquid 160 mg/5 mL  650 mg Per Tube Q6H   arformoterol  15 mcg Nebulization BID   atorvastatin  20 mg Per Tube Daily   budesonide (PULMICORT) nebulizer solution  0.5 mg Nebulization  BID   Chlorhexidine Gluconate Cloth  6 each Topical Q0600   feeding supplement (PROSource TF20)  60 mL Per Tube Daily   insulin aspart  0-9 Units Subcutaneous Q4H   multivitamin with minerals  1 tablet Per Tube Daily   pantoprazole (PROTONIX) IV  40 mg Intravenous Q12H   phosphorus  500 mg Per Tube TID   revefenacin  175 mcg Nebulization Daily   sodium chloride flush  10-40 mL Intracatheter Q12H   thiamine  100 mg Per Tube Daily   Infusions:   sodium chloride 75 mL/hr at 08/25/22 0000   ceFEPime (MAXIPIME) IV 2 g (08/28/22 0946)   feeding supplement (VITAL 1.5 CAL) Stopped (08/28/22 0039)   heparin 1,400 Units/hr (08/28/22 1605)   metronidazole 500 mg (08/28/22 1754)   micafungin (MYCAMINE) 100 mg in sodium chloride 0.9 % 100 mL IVPB 100 mg (08/28/22 0856)     Assessment: Pt with obstructing pelvic mass, CVA, and adencarcinoma. CT showed PE tonight. Heparin has been ordered for anticoagulation. D/t her recent CVA, avoiding heparin boluses. No anticoagulation prior to admission.  PM update - Heparin level remains subtherapeutic at 0.13 but increased from previous after rate increase this morning. CBC stable. No bleeding or issues with infusion per discussion with RN.  Goal of Therapy:  Heparin level 0.3-0.5 units/ml Monitor platelets by anticoagulation protocol: Yes  Plan:  Increase heparin infusion to 1600 units/hr Check 6hr heparin level Monitor daily CBC, s/sx bleeding   Leia Alf, PharmD, BCPS Please check AMION for all [facility] contact numbers Clinical Pharmacist 08/28/2022 8:18 PM

## 2022-08-28 NOTE — Progress Notes (Addendum)
STROKE TEAM PROGRESS NOTE   INTERVAL HISTORY No family is at the bedside.  Pt is lying in bed, lethargic but eyes open, AAO x 3, no aphasia, follows commands. Still has LUE and BLE weakness. Stroke more likely due to global hypoperfusion. Pt refuses TEE. Overnight she had CTA chest which showed PE, heparin IV started.   Vitals:   08/27/22 2324 08/28/22 0356 08/28/22 0800 08/28/22 1202  BP: (!) 144/67 (!) 153/63 (!) 148/65 137/65  Pulse: 91 90 84 95  Resp: 17 16 18 18   Temp: 98 F (36.7 C) 98.9 F (37.2 C) 98.8 F (37.1 C) 98.8 F (37.1 C)  TempSrc: Axillary Axillary Oral Oral  SpO2: 97% 99% 98% 98%  Weight:  78.9 kg    Height:       CBC:  Recent Labs  Lab 08/27/22 0741 08/28/22 0753  WBC 19.4* 19.9*  HGB 10.7* 11.9*  HCT 33.7* 38.8  MCV 79.7* 83.8  PLT 229 245   Basic Metabolic Panel:  Recent Labs  Lab 08/27/22 0741 08/27/22 1712 08/28/22 0753  NA 138  --  139  K 4.1  --  4.0  CL 109  --  110  CO2 22  --  20*  GLUCOSE 142*  --  101*  BUN 34*  --  34*  CREATININE 0.99  --  0.94  CALCIUM 8.0*  --  7.9*  MG 2.0 1.9  --   PHOS 2.6 1.9*  --    Lipid Panel:  Recent Labs  Lab 08/26/22 1659  CHOL 97  TRIG 122  HDL 15*  CHOLHDL 6.5  VLDL 24  LDLCALC 58   HgbA1c:  Recent Labs  Lab 08/27/22 0735  HGBA1C 5.4   Urine Drug Screen: No results for input(s): "LABOPIA", "COCAINSCRNUR", "LABBENZ", "AMPHETMU", "THCU", "LABBARB" in the last 168 hours.  Alcohol Level No results for input(s): "ETH" in the last 168 hours.  IMAGING past 24 hours CT Angio Chest Pulmonary Embolism (PE) W or WO Contrast  Result Date: 08/27/2022 CLINICAL DATA:  Pulmonary embolism (PE) suspected, high prob Weakness and shortness of breath. EXAM: CT ANGIOGRAPHY CHEST WITH CONTRAST TECHNIQUE: Multidetector CT imaging of the chest was performed using the standard protocol during bolus administration of intravenous contrast. Multiplanar CT image reconstructions and MIPs were obtained to evaluate  the vascular anatomy. RADIATION DOSE REDUCTION: This exam was performed according to the departmental dose-optimization program which includes automated exposure control, adjustment of the mA and/or kV according to patient size and/or use of iterative reconstruction technique. CONTRAST:  75mL OMNIPAQUE IOHEXOL 350 MG/ML SOLN COMPARISON:  Radiograph 08/23/2022 FINDINGS: Cardiovascular: Positive for small volume acute pulmonary embolus, involving a subsegmental branch in the right upper lobe, for example series 7, image 99. Small amount of thrombus within right lower lobar pulmonary artery series 7, image 141, extending into the segmental and subsegmental branches. Thromboembolic burden is small to moderate. There is no right heart strain. Moderate calcified as well as noncalcified atheromatous plaque throughout the thoracic aorta, including rounded plaque in the descending aorta series 5, images 84 and 106. No aortic aneurysm. There are coronary artery calcifications. Heart size upper normal. Suggestion of lipomatous hypertrophy of the interatrial septum. Small hiatal hernia. Mediastinum/Nodes: Enteric tube tip in the stomach. Well-circumscribed fluid density structure in the anterior mediastinum measures 2.1 x 2.1 cm series 5, image 57. Well-defined rounded fat density structures in the left suprahilar region, Hounsfield units of -124, 18 and 7 mm series 5, image 57. This appears to  be contiguous with the mediastinal fat. Lungs/Pleura: Small to moderate left and small right pleural effusion. Associated compressive atelectasis. No definite pulmonary infarct. 3-4 mm subpleural right upper lobe nodule has mild surrounding ground-glass, series 6, image 55. No endobronchial debris. Upper Abdomen: Small amount of free fluid in the left upper quadrant. Musculoskeletal: There are no acute or suspicious osseous abnormalities. Review of the MIP images confirms the above findings. IMPRESSION: 1. Positive for acute pulmonary  embolus, involving a subsegmental branch in the right upper lobe and right lower lobar pulmonary artery. Thromboembolic burden is small to moderate. No right heart strain. 2. Small to moderate left and small right pleural effusions with compressive atelectasis. 3. Well-circumscribed fluid density structure in the anterior mediastinum measuring 2.1 x 2.1 cm. This may represent a thymic cyst. Recommend MRI for further assessment on an elective basis after resolution of acute event. 4. There is also a well-defined fat density structure in the left suprahilar region which appears to be contiguous with the mediastinal fat. Etiology is uncertain. 5. A 3-4 mm subpleural right upper lobe nodule has mild surrounding ground-glass. This is likely infectious or inflammatory. Per Fleischner Society Guidelines, no routine follow-up imaging is recommended. These guidelines do not apply to immunocompromised patients and patients with cancer. Follow up in patients with significant comorbidities as clinically warranted. For lung cancer screening, adhere to Lung-RADS guidelines. Reference: Radiology. 2017; 284(1):228-43. 6. Moderate calcified as well as noncalcified atheromatous plaque throughout the thoracic aorta, including rounded plaque in the descending aorta. 7. Aortic Atherosclerosis (ICD10-I70.0). Coronary artery calcifications. Critical Value/emergent results were called by telephone at the time of interpretation on 08/27/2022 at 6:56 PM to provider Karie Fetch , who verbally acknowledged these results. Electronically Signed   By: Narda Rutherford M.D.   On: 08/27/2022 19:02   VAS [place] LOWER EXTREMITY VENOUS (DVT)  Result Date: 08/27/2022  Lower Venous DVT Study Patient Name:  Debra Sharp  Date of Exam:   08/27/2022 Medical Rec #: [id number]      Accession #:    [id number] Date of Birth: 06-Nov-1950      Patient Gender: F Patient Age:   72 years Exam Location:  [facility] Procedure:      VAS [place] LOWER EXTREMITY VENOUS  (DVT) Referring Phys: Scheryl Marten Kaisen Ackers --------------------------------------------------------------------------------  Indications: Hypercoagulable state.  Limitations: Body habitus and poor ultrasound/tissue interface. Comparison Study: No previous exams Performing Technologist: Jody Hill RVT, RDMS  Examination Guidelines: A complete evaluation includes B-mode imaging, spectral Doppler, color Doppler, and power Doppler as needed of all accessible portions of each vessel. Bilateral testing is considered an integral part of a complete examination. Limited examinations for reoccurring indications may be performed as noted. The reflux portion of the exam is performed with the patient in reverse Trendelenburg.  +---------+---------------+---------+-----------+----------+-------------------+ RIGHT    CompressibilityPhasicitySpontaneityPropertiesThrombus Aging      +---------+---------------+---------+-----------+----------+-------------------+ CFV      Full           Yes      Yes                                      +---------+---------------+---------+-----------+----------+-------------------+ SFJ      Full                                                             +---------+---------------+---------+-----------+----------+-------------------+  FV Prox  Full           Yes      Yes                                      +---------+---------------+---------+-----------+----------+-------------------+ FV Mid   Full           Yes      Yes                  Not well visualized +---------+---------------+---------+-----------+----------+-------------------+ FV DistalFull           Yes      Yes                                      +---------+---------------+---------+-----------+----------+-------------------+ PFV                     Yes      Yes                                      +---------+---------------+---------+-----------+----------+-------------------+ POP      Full            Yes      Yes                                      +---------+---------------+---------+-----------+----------+-------------------+ PTV                                                   Not visualized      +---------+---------------+---------+-----------+----------+-------------------+ PERO     Full                                         Not well visualized +---------+---------------+---------+-----------+----------+-------------------+   +---------+---------------+---------+-----------+----------+-------------------+ LEFT     CompressibilityPhasicitySpontaneityPropertiesThrombus Aging      +---------+---------------+---------+-----------+----------+-------------------+ CFV      Full           Yes      Yes                                      +---------+---------------+---------+-----------+----------+-------------------+ SFJ      Full                                                             +---------+---------------+---------+-----------+----------+-------------------+ FV Prox  Full           Yes      Yes                                      +---------+---------------+---------+-----------+----------+-------------------+  FV Mid   Full           Yes      Yes                  Not well visualized +---------+---------------+---------+-----------+----------+-------------------+ FV DistalFull           Yes      Yes                                      +---------+---------------+---------+-----------+----------+-------------------+ PFV                     Yes      Yes                                      +---------+---------------+---------+-----------+----------+-------------------+ POP      Full           Yes      Yes                                      +---------+---------------+---------+-----------+----------+-------------------+ PTV                                                   Not visualized       +---------+---------------+---------+-----------+----------+-------------------+ PERO                                                  Not visualized      +---------+---------------+---------+-----------+----------+-------------------+    Summary: BILATERAL: -No evidence of popliteal cyst, bilaterally. RIGHT: - There is no evidence of deep vein thrombosis in the lower extremity. However, portions of this examination were limited- see technologist comments above.  LEFT: - There is no evidence of deep vein thrombosis in the lower extremity. However, portions of this examination were limited- see technologist comments above.  *See table(s) above for measurements and observations.    Preliminary     PHYSICAL EXAM Constitutional: Ill-appearing Caucasian female laying in ICU bed.  In no acute distress. Psych: Fluctuating levels of cooperation during exam, calm throughout assessment. Eyes: No scleral injection HENT: No OP obstrucion MSK: no joint deformities or swelling Cardiovascular: Tachycardic to the low 100s during examination Respiratory: Effort normal, non-labored breathing GI: No distention, tender to palpitation due to recent surgical procedure. Skin: WDI  Neuro: Mental Status: Drowsy initially, wakes to voice.  Prefers to keep eyes closed during examination.  Will open eyes to command. Patient is oriented to self, place, situation, age, month, year. Patient follows simple commands.  Attention and concentration fluctuate throughout exam. Cranial Nerves: II: Visual Fields are full. PERRL. III,IV, VI: Fixates and tracks examiner throughout. V: Facial sensation is intact and symmetric to light touch VII: Facial movement is symmetric resting and with movement VIII: Hearing is intact to voice X: Palate elevates symmetrically XI: Head is turned rightward, no elevation of left shoulder to command XII: Tongue protrudes midline without atrophy or fasciculations.  Motor: Bulk  is normal.   Left upper extremity is flaccid.  Left lower extremity with inconsistent minimal flicker movement to noxious stimuli.  Right lower extremity moves without gravity, patient wiggles toes to commands.  When asked to elevate right lower extremity, there is minimal effort to elevation.  Right upper extremity moves without gravity with weak grip strength. Sensory: Decreased sensation to the left upper extremity reported to light touch.  Bilateral lower extremity sensation is intact and symmetric to light touch. Cerebellar: Patient does not perform.  ASSESSMENT/PLAN Debra Sharp is a 72 y.o. female with history of HTN, HLD, HFrEF, thrombocytopenia, COPD, asthma, and depression presenting initially with generalized weakness, abdominal pain, and anemia concerning for UGIB and IDA.  Work up concerning for colon perforation and patient went to the OR for emergent ex-lap, right hemicolectomy, ileostomy with fistula, and biopsy of pelvic mass on 08/22/22. Patient was extubated on 08/24/22 and examinations revealed patient with new left-sided weakness. MRI revealed acute and late acute infarcts in multiple vascular territories.   Stroke: b/l anterior and posterior infarcts, pattern more concerning for hypoperfusion in the setting of severe anemia and septic shock with hypotension.  DDx including hypercoagulable state with suspected malignancy  Stroke earlier this admission - Incidental punctate infarcts at right parietal lobe, left frontal centrum semi ovale, and right cerebellum. Etiology likely small vessel disease versus embolic from malignancy  CT Head 08/24/22: Questionable area of loss of gray-white differentiation in the high right parietal lobe concerning for area of acute infarction. Atrophy, chronic small vessel disease.  CTA head & neck 08/26/22: No emergent arterial finding. 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 Short segment of right V2 hypoenhancement without discrete  underlying stenosis or dissection flap. No flow limiting stenosis in the anterior circulation.  MRI 08/26/22: Acute and late acute infarcts in multiple vascular territories, consistent with a central embolic source. No acute hemorrhage or significant mass effect.  Carotid Doppler 08/21/22: Right subclavian artery was stenotic.   2D Echo 08/19/22: LVEF 40-45%  TEE - pt refused, OK not to cancel from neuro standpoint Lower extremity venous Doppler no DVT  LDL 94 HgbA1c 4.9 VTE prophylaxis - heparin IV No antithrombotic prior to admission, now on heparin IV for PE treatment. 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 primary colonic malignancy, secondary pelvic mass  Septic Shock due to peritonitis with colonic perforation secondary to obstructing pelvic mass Acute UTI with right hydronephrosis at admission 2/2 obstructing mass Leukocytosis - Hgb 8.7 >7.1 > 5.8 > 6.6 > 8.9 > 9.0 > 10.7->11.9 - WBC 11.2 > 25.1 > 25.9 > 27.3 > 34.5 > 34.3 > 37.2 > 25.7 > 19.4 >19.9 - On broad spectrum antibiotics- micafungin, flagyl, cefepime - s/p ex-lap 08/24/22  - Pelvic and bowel mass biopsies sent 7/8   Acute PE Hypercoagulable state CTA chest showed subsegmental branch in the right upper lobe and right lower lobar pulmonary artery. Thromboembolic burden is small to moderate.  LE venous doppler no DVT Possible hypercoagulabe state from malignancy and sepsis On heparin IV now. Transition to po per primary team  Acute encephalopathy, improved - Likely multifactorial with critical illness encephalopathy, delirium - much improved now - EEG 08/25/22 suggest moderate diffuse encephalopathy of nonspecific etiology likely related to toxic metabolic causes.  No seizures or  definite epileptiform discharges  were seen. - Delirium precautions  Hx of hypertension Hypotension from septic shock during hospitalization due to colonic perforation (as above) Home meds:  lasix, lisinopril Avoid hypotension Required levophed 7/8 - 7/10, vasopressin 7/8 On TF BP goal normotensive  Hyperlipidemia Home meds:  lovastatin 20 LDL 94, goal < 70 Now on atorvastatin 20 mg  Continue statin at discharge  Other Stroke Risk Factors Advanced Age >/= 54  Congestive heart failure   Other Active Problems AKI, resolved  Hospital day # 10  Neurology will sign off. Please call with questions. Pt will follow up with stroke clinic NP at [facility] in about 4 weeks. Thanks for the consult.   Marvel Plan, MD PhD Stroke Neurology 08/28/2022 12:57 PM   To contact Stroke Continuity provider, please refer to [email or web address]. After hours, contact General Neurology

## 2022-08-28 NOTE — Progress Notes (Signed)
ANTICOAGULATION CONSULT NOTE - Follow-Up Consult  Pharmacy Consult for heparin  Indication: pulmonary embolus  Allergies  Allergen Reactions   Aspirin Other (See Comments)    Caused an asthma attack   Ibuprofen Swelling and Other (See Comments)    Angioedema and Asthma exacerbation   Sulfonamide Derivatives Swelling and Other (See Comments)    Face swelled SEVERELY   Penicillins Rash    Patient Measurements: Height: 5' 5.98" (167.6 cm) Weight: 78.9 kg (173 lb 15.1 oz) IBW/kg (Calculated) : 59.26 Heparin Dosing Weight: 75kg  Vital Signs: Temp: 98.8 F (37.1 C) (07/12 0800) Temp Source: Oral (07/12 0800) BP: 148/65 (07/12 0800) Pulse Rate: 84 (07/12 0800)  Labs: Recent Labs    08/26/22 0512 08/27/22 0741 08/28/22 0753  HGB 9.0* 10.7* 11.9*  HCT 28.5* 33.7* 38.8  PLT 224 229 245  HEPARINUNFRC  --   --  <0.10*  CREATININE 1.17* 0.99  --     Estimated Creatinine Clearance: 56 mL/min (by C-G formula based on SCr of 0.99 mg/dL).   Medical History: Past Medical History:  Diagnosis Date   Anemia    Anxiety    Arthritis    Asthma    Blood transfusion without reported diagnosis    Cataract    removed bilateral   COPD (chronic obstructive pulmonary disease) (HCC)    Depression    GERD (gastroesophageal reflux disease)    Hyperlipidemia    Hypertension     Medications:  Medications Prior to Admission  Medication Sig Dispense Refill Last Dose   cetirizine (ZYRTEC) 10 MG tablet Take 1 tablet (10 mg total) by mouth daily. (Patient taking differently: Take 10 mg by mouth daily as needed for allergies or rhinitis.) 30 tablet 11 08/17/2022   ferrous sulfate 325 (65 FE) MG EC tablet Take 325 mg by mouth daily.   08/17/2022   fluticasone (FLONASE) 50 MCG/ACT nasal spray Place 2 sprays into both nostrils daily. (Patient taking differently: Place 2 sprays into both nostrils at bedtime as needed for allergies or rhinitis.) 16 g 6 08/16/2022   lisinopril (ZESTRIL) 40 MG tablet  Take 40 mg by mouth in the morning.   08/17/2022   lovastatin (MEVACOR) 20 MG tablet Take 20 mg by mouth at bedtime.   08/17/2022   PROAIR HFA 108 (90 Base) MCG/ACT inhaler Inhale 2 puffs into the lungs every 6 (six) hours as needed for wheezing or shortness of breath.   08/17/2022   sertraline (ZOLOFT) 100 MG tablet Take 150 mg by mouth in the morning.   08/17/2022   SYMBICORT 160-4.5 MCG/ACT inhaler Inhale 2 puffs into the lungs in the morning and at bedtime.   08/17/2022   furosemide (LASIX) 20 MG tablet Take 1 tablet (20 mg total) by mouth daily. (Patient not taking: Reported on 05/13/2022) 10 tablet 0 Not Taking   predniSONE (STERAPRED UNI-PAK 21 TAB) 10 MG (21) TBPK tablet 6 tabs for 1 day, then 5 tabs for 1 das, then 4 tabs for 1 day, then 3 tabs for 1 day, 2 tabs for 1 day, then 1 tab for 1 day (Patient not taking: Reported on 04/24/2022) 21 tablet 0 Not Taking   Scheduled:   acetaminophen (TYLENOL) oral liquid 160 mg/5 mL  650 mg Per Tube Q6H   arformoterol  15 mcg Nebulization BID   atorvastatin  20 mg Per Tube Daily   budesonide (PULMICORT) nebulizer solution  0.5 mg Nebulization BID   Chlorhexidine Gluconate Cloth  6 each Topical Q0600  feeding supplement (PROSource TF20)  60 mL Per Tube Daily   insulin aspart  0-9 Units Subcutaneous Q4H   multivitamin with minerals  1 tablet Per Tube Daily   pantoprazole (PROTONIX) IV  40 mg Intravenous Q12H   revefenacin  175 mcg Nebulization Daily   sodium chloride flush  10-40 mL Intracatheter Q12H   thiamine  100 mg Per Tube Daily   Infusions:   sodium chloride 75 mL/hr at 08/25/22 0000   ceFEPime (MAXIPIME) IV Stopped (08/27/22 2228)   dextrose 10 mL/hr at 08/28/22 0041   feeding supplement (VITAL 1.5 CAL) Stopped (08/28/22 0039)   heparin 1,200 Units/hr (08/27/22 1945)   metronidazole Stopped (08/28/22 0603)   micafungin (MYCAMINE) 100 mg in sodium chloride 0.9 % 100 mL IVPB Stopped (08/27/22 0844)    Assessment: Pt with obstructing pelvic  mass, CVA, and adencarcinoma. CT showed PE tonight. Heparin has been ordered for anticoagulation. D/t her recent CVA, we will try to avoid bolus and use a high starting rate. No anticoagulation prior to admission.  Heparin level < 0.10 is subtherapeutic with heparin infusion at 1200 units/hr. Hgb (11.9) and PLTs (245) are stable. No issues with infusion or signs of bleeding per nurse.   Goal of Therapy:  Heparin level 0.3-0.5 units/ml Monitor platelets by anticoagulation protocol: Yes  Plan:  Increase heparin infusion to 1400 units/hr Check 8 hr HL Daily HL and CBC Monitor for signs/symptoms of bleeding  Romie Minus, PharmD PGY1 Pharmacy Resident  Please check AMION for all [facility] Pharmacy phone numbers After 10:00 PM, call Main Pharmacy [phone]

## 2022-08-28 NOTE — Progress Notes (Signed)
Patient has pure wick and only voided 150 ml amber urine. Patient has not been incont. Tonight. Bladder scan 440 ml urine . And patient has abdo wound with no dressing change orders. Misty Stanley R.N. with [facility] called and made aware and will let M.D. know. Reden R.N. aware.

## 2022-08-28 NOTE — Progress Notes (Signed)
SLP Cancellation Note  Patient Details Name: Debra Sharp MRN: [id number] DOB: [birth year]   Cancelled treatment:       Reason Eval/Treat Not Completed: Patient at procedure or test/unavailable (working with PT in the room). Will f/u as able.     Mahala Menghini., M.A. CCC-SLP Acute Rehabilitation Services Office [phone]  Secure chat preferred  08/28/2022, 1:05 PM

## 2022-08-29 DIAGNOSIS — K922 Gastrointestinal hemorrhage, unspecified: Secondary | ICD-10-CM | POA: Diagnosis not present

## 2022-08-29 DIAGNOSIS — D62 Acute posthemorrhagic anemia: Secondary | ICD-10-CM | POA: Diagnosis not present

## 2022-08-29 DIAGNOSIS — J9601 Acute respiratory failure with hypoxia: Secondary | ICD-10-CM | POA: Diagnosis not present

## 2022-08-29 DIAGNOSIS — G9341 Metabolic encephalopathy: Secondary | ICD-10-CM | POA: Diagnosis not present

## 2022-08-29 DIAGNOSIS — C801 Malignant (primary) neoplasm, unspecified: Secondary | ICD-10-CM | POA: Diagnosis not present

## 2022-08-29 LAB — CBC
HCT: 32.4 % — ABNORMAL LOW (ref 36.0–46.0)
Hemoglobin: 9.9 g/dL — ABNORMAL LOW (ref 12.0–15.0)
MCH: 24.6 pg — ABNORMAL LOW (ref 26.0–34.0)
MCHC: 30.6 g/dL (ref 30.0–36.0)
MCV: 80.6 fL (ref 80.0–100.0)
Platelets: 320 10*3/uL (ref 150–400)
RBC: 4.02 MIL/uL (ref 3.87–5.11)
RDW: 26.9 % — ABNORMAL HIGH (ref 11.5–15.5)
WBC: 21.2 10*3/uL — ABNORMAL HIGH (ref 4.0–10.5)
nRBC: 0 % (ref 0.0–0.2)

## 2022-08-29 LAB — GLUCOSE, CAPILLARY
Glucose-Capillary: 129 mg/dL — ABNORMAL HIGH (ref 70–99)
Glucose-Capillary: 119 mg/dL — ABNORMAL HIGH (ref 70–99)
Glucose-Capillary: 120 mg/dL — ABNORMAL HIGH (ref 70–99)
Glucose-Capillary: 112 mg/dL — ABNORMAL HIGH (ref 70–99)
Glucose-Capillary: 109 mg/dL — ABNORMAL HIGH (ref 70–99)
Glucose-Capillary: 119 mg/dL — ABNORMAL HIGH (ref 70–99)

## 2022-08-29 LAB — PHOSPHORUS: Phosphorus: 2.6 mg/dL (ref 2.5–4.6)

## 2022-08-29 LAB — MAGNESIUM: Magnesium: 1.7 mg/dL (ref 1.7–2.4)

## 2022-08-29 LAB — HEPARIN LEVEL (UNFRACTIONATED)
Heparin Unfractionated: 0.2 IU/mL — ABNORMAL LOW (ref 0.30–0.70)
Heparin Unfractionated: 0.26 IU/mL — ABNORMAL LOW (ref 0.30–0.70)

## 2022-08-29 MED ORDER — TAMSULOSIN HCL 0.4 MG PO CAPS
0.4000 mg | ORAL_CAPSULE | Freq: Every day | ORAL | Status: DC
  Administered 2022-08-29 – 2022-09-14 (×14): 0.4 mg via ORAL
  Filled 2022-08-29 (×15): qty 1

## 2022-08-29 MED ORDER — MAGNESIUM SULFATE 2 GM/50ML IV SOLN
2.0000 g | Freq: Once | INTRAVENOUS | Status: AC
  Administered 2022-08-29: 2 g via INTRAVENOUS
  Filled 2022-08-29: qty 50

## 2022-08-29 NOTE — Plan of Care (Signed)
  Problem: Health Behavior/Discharge Planning: Goal: Ability to manage health-related needs will improve Outcome: Progressing   Problem: Clinical Measurements: Goal: Ability to maintain clinical measurements within normal limits will improve Outcome: Progressing Goal: Will remain free from infection Outcome: Progressing Goal: Diagnostic test results will improve Outcome: Progressing Goal: Respiratory complications will improve Outcome: Progressing Goal: Cardiovascular complication will be avoided Outcome: Progressing   Problem: Activity: Goal: Risk for activity intolerance will decrease Outcome: Progressing   Problem: Nutrition: Goal: Adequate nutrition will be maintained Outcome: Progressing   Problem: Coping: Goal: Level of anxiety will decrease Outcome: Progressing   Problem: Elimination: Goal: Will not experience complications related to bowel motility Outcome: Progressing Goal: Will not experience complications related to urinary retention Outcome: Progressing   Problem: Pain Managment: Goal: General experience of comfort will improve Outcome: Progressing   Problem: Safety: Goal: Ability to remain free from injury will improve Outcome: Progressing   Problem: Skin Integrity: Goal: Risk for impaired skin integrity will decrease Outcome: Progressing   Problem: Education: Goal: Ability to describe self-care measures that may prevent or decrease complications (Diabetes Survival Skills Education) will improve Outcome: Progressing Goal: Individualized Educational Video(s) Outcome: Progressing   Problem: Coping: Goal: Ability to adjust to condition or change in health will improve Outcome: Progressing   Problem: Fluid Volume: Goal: Ability to maintain a balanced intake and output will improve Outcome: Progressing   Problem: Health Behavior/Discharge Planning: Goal: Ability to identify and utilize available resources and services will improve Outcome:  Progressing Goal: Ability to manage health-related needs will improve Outcome: Progressing   Problem: Metabolic: Goal: Ability to maintain appropriate glucose levels will improve Outcome: Progressing   Problem: Nutritional: Goal: Maintenance of adequate nutrition will improve Outcome: Progressing Goal: Progress toward achieving an optimal weight will improve Outcome: Progressing   Problem: Skin Integrity: Goal: Risk for impaired skin integrity will decrease Outcome: Progressing   Problem: Tissue Perfusion: Goal: Adequacy of tissue perfusion will improve Outcome: Progressing   Problem: Education: Goal: Knowledge of disease or condition will improve Outcome: Progressing Goal: Knowledge of secondary prevention will improve (MUST DOCUMENT ALL) Outcome: Progressing Goal: Knowledge of patient specific risk factors will improve Loraine Leriche N/A or DELETE if not current risk factor) Outcome: Progressing   Problem: Ischemic Stroke/TIA Tissue Perfusion: Goal: Complications of ischemic stroke/TIA will be minimized Outcome: Progressing   Problem: Coping: Goal: Will verbalize positive feelings about self Outcome: Progressing Goal: Will identify appropriate support needs Outcome: Progressing   Problem: Health Behavior/Discharge Planning: Goal: Ability to manage health-related needs will improve Outcome: Progressing Goal: Goals will be collaboratively established with patient/family Outcome: Progressing   Problem: Self-Care: Goal: Ability to participate in self-care as condition permits will improve Outcome: Progressing Goal: Verbalization of feelings and concerns over difficulty with self-care will improve Outcome: Progressing Goal: Ability to communicate needs accurately will improve Outcome: Progressing   Problem: Nutrition: Goal: Risk of aspiration will decrease Outcome: Progressing Goal: Dietary intake will improve Outcome: Progressing

## 2022-08-29 NOTE — Progress Notes (Signed)
PROGRESS NOTE  Debra Sharp  ZOX:[id number] DOB: 01-Jul-1950 DOA: 08/18/2022 PCP: Clayborn Heron, MD   Brief Narrative:  Patient is a 72 year old female with past medical history of hypertension, hyperlipidemia, systolic CHF, thrombocytopenia, COPD, asthma who initially presented with weakness, abdominal pain.  Workup showed anemia concerning for upper GI bleed.  CT abdomen/pelvis showed colonic distention, possible obstruction.  GI consulted, plan was for EGD but she could not tolerate prep.  Also found to have right-sided hydronephrosis, incidental acute stroke on CT/MRI.  Repeat CT abdomen/pelvis on 7/6 showed new intraperitoneal free air with presumed perforation of the colon.  Surgery consulted and she underwent emergent exploratory laparotomy, right hemicolectomy, ileostomy with fistula, biopsy of pelvic mass.  Patient was left intubated and was transferred to ICU for pain management.  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  Palliative  care consulted.  General surgery following.  Currently on tube feeding.  Hospital course also remarkable for finding of acute PE now on heparin drip.  Now extubated and transferred to [facility] service on 7/12.  Plan was for TEE which has been canceled because patient declined.  Palliative care, urology ,surgery ,neurology were following.  PT/OT recommending SNF on discharge.  Assessment & Plan:  Principal Problem:   GI bleed Active Problems:   Symptomatic anemia   Thrombocytosis   Iron deficiency anemia   Abnormal CT of the abdomen   Colon distention   History of cardioembolic cerebrovascular accident (CVA)   Silent cerebral infarction (HCC)   Bowel perforation (HCC)   Ventilator dependent (HCC)   Pelvic mass   Change in bowel habits   Abnormal CT scan, gastrointestinal tract   Arterial hypotension   Septic shock (HCC)   Fecal occult blood test positive   Uterine leiomyoma   Hydroureteronephrosis   Sepsis with acute hypoxic  respiratory failure and septic shock (HCC)   Encephalopathy acute   Perforation bowel (HCC)   Malnutrition of moderate degree   Cerebrovascular accident (CVA) due to embolism of precerebral artery (HCC)   Acute metabolic encephalopathy   Delirium  Acute hypoxic respiratory failure: Intubated postoperatively.  Currently extubated and on room air.    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  Currently in broad spectrum antibiotics with micafungin, Flagyl, cefepime.  Wound  cultures NGTD.  On tube feed.Speech therapy consulted to advance the diet by mouth  Currently hemodynamically stable.  General surgery following.  Leukocytosis persist.  She is afebrile.  Pelvic mass: Culprit for colonic perforation.  Initially concern for gynecological malignancy with elevated CA125, CA 19-19 but pathology showed small bowel primary.   General surgery recommending arranging outpatient follow-up with oncology before discharge, no need for immediate consultation.  Will make sure oncology knows about the follow-up appointment before discharge.   Acute metabolic encephalopathy: Likely from sepsis, ICU delirium, CVAs.  Currently alert and oriented.    Acute CVA: MRI done on 7/4 showed single punctuate enhancement in the right frontoparietal area, no evidence of mets.  EEG done on 7/9 did not show any seizure.  Suspected to be embolic CVA versus secondary to hypoperfusion in the setting of anemia/septic shock.  Neurology were following.  There was plan for TEE but patient has declined.  Plan to start on Plavix at some point.  Continue statin.  PT/OT/SLP following.  Recommended SNF on discharge  She has global weakness.  Hardly  able to get up from the bed.  Acute PE: CT angiogram showed acute PE.  Currently on  heparin drip.  No evidence of right heart strain.  Will start her on Eliquis when surgery clears  AKI/hyperkalemia: Resolved.  Currently kidney function is stable.  Hydronephrosis secondary to abdominal mass: Urology following.  Repeat ultrasound shows unchanged right-sided hydronephrosis.  Patient declined ureteral stent  Acute on chronic renal disease anemia/acute blood loss anemia secondary to surgery/chronic GI bleed due to small bowel adenocarcinoma: Monitor hemoglobin.  Transfuse if less than 7.  Currently on PPI.  Currently hemoglobin is stable   Hypertension: Off hypertensives.  Monitor blood pressure  Hyperlipidemia: Currently on Lipitor  Chronic systolic CHF: Echo on 08/18/2020 showed EF of 40 to 45%, global hypokinesis, normal right ventricular function.  Previous echo on March 2024 showed EF of 30 to 35%.  We recommend follow-up with cardiology as an outpatient  Hyperglycemia: Currently on sliding scale insulin.  Goals of care: Multiple comorbidities.  General surgery recommended palliative care consulted for new finding of malignancy, and incomplete restriction.  Palliative care following.  Remains full code.         Nutrition Problem: Moderate Malnutrition Etiology: chronic illness    DVT prophylaxis:SCDs Start: 08/18/22 1710     Code Status: Full Code  Family Communication: Called and discussed with the son [name] on phone on 7/12  Patient status:Inpatient  Patient is from :home  Anticipated discharge to:SnF  Estimated DC date:after general surgery clearance   Consultants: General surgery, PCCM, palliative care, neurology  Procedures: Exploratory laparotomy  Antimicrobials:  Anti-infectives (From admission, onward)    Start     Dose/Rate Route Frequency Ordered Stop   08/23/22 2100  ceFEPIme (MAXIPIME) 2 g in sodium chloride 0.9 % 100 mL IVPB        2 g 200 mL/hr over 30 Minutes Intravenous Every 12 hours 08/23/22 1357     08/23/22 0400  micafungin  (MYCAMINE) 100 mg in sodium chloride 0.9 % 100 mL IVPB        100 mg 105 mL/hr over 1 Hours Intravenous Daily 08/23/22 0242     08/22/22 1800  metroNIDAZOLE (FLAGYL) IVPB 500 mg        500 mg 100 mL/hr over 60 Minutes Intravenous Every 12 hours 08/22/22 1711     08/22/22 1800  ceFEPIme (MAXIPIME) 2 g in sodium chloride 0.9 % 100 mL IVPB  Status:  Discontinued        2 g 200 mL/hr over 30 Minutes Intravenous Every 8 hours 08/22/22 1716 08/23/22 1357   08/22/22 1724  metroNIDAZOLE (FLAGYL) 500 MG/100ML IVPB       Note to Pharmacy: [name]: cabinet override      08/22/22 1724 08/23/22 0845   08/19/22 1030  cefTRIAXone (ROCEPHIN) 1 g in sodium chloride 0.9 % 100 mL IVPB  Status:  Discontinued        1 g 200 mL/hr over 30 Minutes Intravenous Every 24 hours 08/19/22 0937 08/22/22 1715       Subjective: Patient seen and examined at bedside today.  Hemodynamically stable.  Alert and oriented.  Lying in bed.  Remains weak, mostly unable to move her extremities.  She started having clear liquid diet.  She had output in the ostomy bag  Objective: Vitals:   08/29/22 0738 08/29/22 0741 08/29/22 0743 08/29/22 0836  BP:    (!) 135/57  Pulse: 82   83  Resp: 17   19  Temp:    97.6 F (36.4 C)  TempSrc:    Oral  SpO2: 100% 100% 100% 100%  Weight:      Height:        Intake/Output Summary (Last 24 hours) at 08/29/2022 1047 Last data filed at 08/29/2022 0536 Gross per 24 hour  Intake --  Output 2135 ml  Net -2135 ml   Filed Weights   08/27/22 0320 08/28/22 0356 08/29/22 0500  Weight: 78.1 kg 78.9 kg 79.8 kg    Examination:   General exam: Overall comfortable, not in distress, weak, deconditioned HEENT: PERRL, feeding tube Respiratory system:  no wheezes or crackles  Cardiovascular system: S1 & S2 heard, RRR.  Gastrointestinal system: Abdomen is nondistended, soft , ostomy pouch with brown output, left-sided drain, dressings Central nervous system: Alert and oriented, global  weakness Extremities: Bilateral lower extremity pitting edema  skin: No rashes, no ulcers,no icterus      Data Reviewed: I have personally reviewed following labs and imaging studies  CBC: Recent Labs  Lab 08/25/22 0436 08/25/22 1054 08/25/22 1728 08/26/22 0512 08/27/22 0741 08/28/22 0753 08/29/22 0436  WBC 37.2*  --   --  25.7* 19.4* 19.9* 21.2*  HGB 5.8*   < > 8.9* 9.0* 10.7* 11.9* 9.9*  HCT 20.0*   < > 27.8* 28.5* 33.7* 38.8 32.4*  MCV 76.9*  --   --  80.3 79.7* 83.8 80.6  PLT 273  --   --  224 229 245 320   < > = values in this interval not displayed.   Basic Metabolic Panel: Recent Labs  Lab 08/24/22 0340 08/24/22 0342 08/25/22 0436 08/26/22 0512 08/26/22 1659 08/27/22 0741 08/27/22 1712 08/28/22 0753 08/29/22 0436  NA 135 136 135 138  --  138  --  139  --   K 5.8* 5.9* 4.5 4.1  --  4.1  --  4.0  --   CL 109  --  105 105  --  109  --  110  --   CO2 20*  --  23 23  --  22  --  20*  --   GLUCOSE 161*  --  126* 96  --  142*  --  101*  --   BUN 25*  --  31* 34*  --  34*  --  34*  --   CREATININE 1.32*  --  1.26* 1.17*  --  0.99  --  0.94  --   CALCIUM 8.1*  --  7.9* 8.2*  --  8.0*  --  7.9*  --   MG 1.7  --   --  2.1 2.1 2.0 1.9  --  1.7  PHOS 3.8  --   --  3.5 2.9 2.6 1.9*  --  2.6     Recent Results (from the past 240 hour(s))  MRSA Next Gen by PCR, Nasal     Status: None   Collection Time: 08/22/22  8:15 PM   Specimen: Nasal Mucosa; Nasal Swab  Result Value Ref Range Status   MRSA by PCR Next Gen NOT DETECTED NOT DETECTED Final    Comment: (NOTE) The GeneXpert MRSA Assay (FDA approved for NASAL specimens only), is one component of a comprehensive MRSA colonization surveillance program. It is not intended to diagnose MRSA infection nor to guide or monitor treatment for MRSA infections. Test performance is not FDA approved in patients less than 28 years old. Performed at [facility], [address] [place], [place] [address]   Culture, blood  (Routine X 2) w Reflex to ID Panel     Status: None   Collection Time: 08/22/22  8:52 PM   Specimen: BLOOD  Result Value Ref Range Status   Specimen Description BLOOD  Final   Special Requests   Final    LINE DRAW BOTTLES DRAWN AEROBIC AND ANAEROBIC Blood Culture adequate volume   Culture   Final    NO GROWTH 5 DAYS Performed at [facility], [address], [place], [place] [address]    Report Status 08/27/2022 FINAL  Final  Culture, blood (Routine X 2) w Reflex to ID Panel     Status: None   Collection Time: 08/22/22  9:08 PM   Specimen: BLOOD  Result Value Ref Range Status   Specimen Description BLOOD  Final   Special Requests   Final    LINE DRAW BOTTLES DRAWN AEROBIC AND ANAEROBIC Blood Culture adequate volume   Culture   Final    NO GROWTH 5 DAYS Performed at [facility], [address], [place], [place] [address]    Report Status 08/27/2022 FINAL  Final     Radiology Studies: CT Angio Chest Pulmonary Embolism (PE) W or WO Contrast  Result Date: 08/27/2022 CLINICAL DATA:  Pulmonary embolism (PE) suspected, high prob Weakness and shortness of breath. EXAM: CT ANGIOGRAPHY CHEST WITH CONTRAST TECHNIQUE: Multidetector CT imaging of the chest was performed using the standard protocol during bolus administration of intravenous contrast. Multiplanar CT image reconstructions and MIPs were obtained to evaluate the vascular anatomy. RADIATION DOSE REDUCTION: This exam was performed according to the departmental dose-optimization program which includes automated exposure control, adjustment of the mA and/or kV according to patient size and/or use of iterative reconstruction technique. CONTRAST:  75mL OMNIPAQUE IOHEXOL 350 MG/ML SOLN COMPARISON:  Radiograph 08/23/2022 FINDINGS: Cardiovascular: Positive for small volume acute pulmonary embolus, involving a subsegmental branch in the right upper lobe, for example series 7, image 99. Small amount of thrombus within right lower lobar  pulmonary artery series 7, image 141, extending into the segmental and subsegmental branches. Thromboembolic burden is small to moderate. There is no right heart strain. Moderate calcified as well as noncalcified atheromatous plaque throughout the thoracic aorta, including rounded plaque in the descending aorta series 5, images 84 and 106. No aortic aneurysm. There are coronary artery calcifications. Heart size upper normal. Suggestion of lipomatous hypertrophy of the interatrial septum. Small hiatal hernia. Mediastinum/Nodes: Enteric tube tip in the stomach. Well-circumscribed fluid density structure in the anterior mediastinum measures 2.1 x 2.1 cm series 5, image 57. Well-defined rounded fat density structures in the left suprahilar region, Hounsfield units of -124, 18 and 7 mm series 5, image 57. This appears to be contiguous with the mediastinal fat. Lungs/Pleura: Small to moderate left and small right pleural effusion. Associated compressive atelectasis. No definite pulmonary infarct. 3-4 mm subpleural right upper lobe nodule has mild surrounding ground-glass, series 6, image 55. No endobronchial debris. Upper Abdomen: Small amount of free fluid in the left upper quadrant. Musculoskeletal: There are no acute or suspicious osseous abnormalities. Review of the MIP images confirms the above findings. IMPRESSION: 1. Positive for acute pulmonary embolus, involving a subsegmental branch in the right upper lobe and right lower lobar pulmonary artery. Thromboembolic burden is small to moderate. No right heart strain. 2. Small to moderate left and small right pleural effusions with compressive atelectasis. 3. Well-circumscribed fluid density structure in the  anterior mediastinum measuring 2.1 x 2.1 cm. This may represent a thymic cyst. Recommend MRI for further assessment on an elective basis after resolution of acute event. 4. There is also a well-defined fat density structure in the left suprahilar region which  appears to be contiguous with the mediastinal fat. Etiology is uncertain. 5. A 3-4 mm subpleural right upper lobe nodule has mild surrounding ground-glass. This is likely infectious or inflammatory. Per Fleischner Society Guidelines, no routine follow-up imaging is recommended. These guidelines do not apply to immunocompromised patients and patients with cancer. Follow up in patients with significant comorbidities as clinically warranted. For lung cancer screening, adhere to Lung-RADS guidelines. Reference: Radiology. 2017; 284(1):228-43. 6. Moderate calcified as well as noncalcified atheromatous plaque throughout the thoracic aorta, including rounded plaque in the descending aorta. 7. Aortic Atherosclerosis (ICD10-I70.0). Coronary artery calcifications. Critical Value/emergent results were called by telephone at the time of interpretation on 08/27/2022 at 6:56 PM to provider Karie Fetch , who verbally acknowledged these results. Electronically Signed   By: Narda Rutherford M.D.   On: 08/27/2022 19:02   VAS [place] LOWER EXTREMITY VENOUS (DVT)  Result Date: 08/27/2022  Lower Venous DVT Study Patient Name:  MAKYAH SHELMAN  Date of Exam:   08/27/2022 Medical Rec #: [id number]      Accession #:    [id number] Date of Birth: August 03, 1950      Patient Gender: F Patient Age:   20 years Exam Location:  [facility] Procedure:      VAS [place] LOWER EXTREMITY VENOUS (DVT) Referring Phys: Scheryl Marten XU --------------------------------------------------------------------------------  Indications: Hypercoagulable state.  Limitations: Body habitus and poor ultrasound/tissue interface. Comparison Study: No previous exams Performing Technologist: Jody Hill RVT, RDMS  Examination Guidelines: A complete evaluation includes B-mode imaging, spectral Doppler, color Doppler, and power Doppler as needed of all accessible portions of each vessel. Bilateral testing is considered an integral part of a complete examination. Limited examinations for  reoccurring indications may be performed as noted. The reflux portion of the exam is performed with the patient in reverse Trendelenburg.  +---------+---------------+---------+-----------+----------+-------------------+ RIGHT    CompressibilityPhasicitySpontaneityPropertiesThrombus Aging      +---------+---------------+---------+-----------+----------+-------------------+ CFV      Full           Yes      Yes                                      +---------+---------------+---------+-----------+----------+-------------------+ SFJ      Full                                                             +---------+---------------+---------+-----------+----------+-------------------+ FV Prox  Full           Yes      Yes                                      +---------+---------------+---------+-----------+----------+-------------------+ FV Mid   Full           Yes      Yes                  Not well visualized +---------+---------------+---------+-----------+----------+-------------------+  FV DistalFull           Yes      Yes                                      +---------+---------------+---------+-----------+----------+-------------------+ PFV                     Yes      Yes                                      +---------+---------------+---------+-----------+----------+-------------------+ POP      Full           Yes      Yes                                      +---------+---------------+---------+-----------+----------+-------------------+ PTV                                                   Not visualized      +---------+---------------+---------+-----------+----------+-------------------+ PERO     Full                                         Not well visualized +---------+---------------+---------+-----------+----------+-------------------+   +---------+---------------+---------+-----------+----------+-------------------+ LEFT      CompressibilityPhasicitySpontaneityPropertiesThrombus Aging      +---------+---------------+---------+-----------+----------+-------------------+ CFV      Full           Yes      Yes                                      +---------+---------------+---------+-----------+----------+-------------------+ SFJ      Full                                                             +---------+---------------+---------+-----------+----------+-------------------+ FV Prox  Full           Yes      Yes                                      +---------+---------------+---------+-----------+----------+-------------------+ FV Mid   Full           Yes      Yes                  Not well visualized +---------+---------------+---------+-----------+----------+-------------------+ FV DistalFull           Yes      Yes                                      +---------+---------------+---------+-----------+----------+-------------------+  PFV                     Yes      Yes                                      +---------+---------------+---------+-----------+----------+-------------------+ POP      Full           Yes      Yes                                      +---------+---------------+---------+-----------+----------+-------------------+ PTV                                                   Not visualized      +---------+---------------+---------+-----------+----------+-------------------+ PERO                                                  Not visualized      +---------+---------------+---------+-----------+----------+-------------------+    Summary: BILATERAL: -No evidence of popliteal cyst, bilaterally. RIGHT: - There is no evidence of deep vein thrombosis in the lower extremity. However, portions of this examination were limited- see technologist comments above.  LEFT: - There is no evidence of deep vein thrombosis in the lower extremity. However, portions of this  examination were limited- see technologist comments above.  *See table(s) above for measurements and observations.    Preliminary    US RENAL  Result Date: 08/27/2022 CLINICAL DATA:  Hydronephrosis EXAM: RENAL / URINARY TRACT ULTRASOUND COMPLETE COMPARISON:  CT abdomen pelvis 08/22/2022 FINDINGS: Right Kidney: Renal measurements: 11.1 x 5.8 x 5.4 cm = volume: 181 mL. Diffusely thinned cortex. Unchanged severe hydronephrosis. Left Kidney: Renal measurements: 11.9 x 8.4 x 5.4 cm = volume: 16 mL. Echogenicity within normal limits. No mass or hydronephrosis visualized. Bladder: Not visualized. Other: None. IMPRESSION: Unchanged severe right hydronephrosis. Electronically Signed   By: Acquanetta Belling M.D.   On: 08/27/2022 12:00    Scheduled Meds:  acetaminophen (TYLENOL) oral liquid 160 mg/5 mL  650 mg Per Tube Q6H   arformoterol  15 mcg Nebulization BID   atorvastatin  20 mg Per Tube Daily   budesonide (PULMICORT) nebulizer solution  0.5 mg Nebulization BID   Chlorhexidine Gluconate Cloth  6 each Topical Q0600   feeding supplement (PROSource TF20)  60 mL Per Tube Daily   insulin aspart  0-9 Units Subcutaneous Q4H   multivitamin with minerals  1 tablet Per Tube Daily   pantoprazole (PROTONIX) IV  40 mg Intravenous Q12H   revefenacin  175 mcg Nebulization Daily   sodium chloride flush  10-40 mL Intracatheter Q12H   thiamine  100 mg Per Tube Daily   Continuous Infusions:  sodium chloride 75 mL/hr at 08/25/22 0000   ceFEPime (MAXIPIME) IV 2 g (08/29/22 0950)   feeding supplement (VITAL 1.5 CAL) 1,000 mL (08/29/22 7829)   heparin 1,800 Units/hr (08/29/22 5621)   magnesium sulfate bolus IVPB 2 g (08/29/22 1033)   metronidazole 500 mg (08/29/22 0509)   micafungin (MYCAMINE)  100 mg in sodium chloride 0.9 % 100 mL IVPB 100 mg (08/29/22 0801)     LOS: 11 days   Burnadette Pop, MD [facility] P7/13/2024, 10:47 AM

## 2022-08-29 NOTE — Progress Notes (Signed)
Daily Progress Note   Patient Name: Debra Sharp       Date: 08/29/2022 DOB: Nov 10, 1950  Age: 72 y.o. MRN#: [id number] Attending Physician: Burnadette Pop, MD Primary Care Physician: Clayborn Heron, MD Admit Date: 08/18/2022  Reason for Consultation/Follow-up: Establishing goals of care  Subjective: Medical records reviewed including progress notes, labs, imaging. Patient assessed at the bedside.  She denies pain or distress.  Her son Molly Maduro is present for her scheduled family meeting.  Reoriented patient to the purpose of palliative care involvement in today's family meeting.  She is agreeable with proceeding.  We discussed a brief life review and then focused on her current acute illness.  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  She greatly enjoyed being around all of the young children and taking care of them.  Reviewed her acute illness in great detail, she did not remember how she got to the hospital or what exactly occurred.  Discussed concern that patient's cancer was not completely resected, concern for poor tolerance of aggressive cancer management including chemotherapy or radiation, acute complications including PE and heparin, and poor long-term prognosis.  Patient is disheartened to hear this, telling me she was feeling good about her progress until this discussion.  When I reviewed that urology is concerned that she would likely need hospice if she did not proceed with stent, she states "you are bringing me down."  She is very clear that she is not ready for hospice.  She tells me that she would be okay with taking chemotherapy pills, but not IV infusions and she is uncertain about her wishes regarding  radiation.  Provided reassurance that she can make her decisions as she wishes, while emphasizing the importance that these are informed decisions.  Her son Molly Maduro confirms that he was aware this is how she felt and that he is going to honor support her wishes.  He has not heard more about [facility] since the last time this was discussed.  Educated patient on purpose of LTAC and the difference between this and SNF.  The goal remains for patient to return home after additional rehab.  Patient's son would also like help with FMLA paperwork today.  Patient is agreeable to picking of the  conversation tomorrow, as it is a bit too much for her today.  Questions and concerns addressed. PMT will continue to support holistically.   Length of Stay: 11   Physical Exam Vitals and nursing note reviewed.  Constitutional:      General: She is not in acute distress.    Appearance: She is ill-appearing.     Comments: Cortrak in place  Cardiovascular:     Rate and Rhythm: Normal rate.  Pulmonary:     Effort: Pulmonary effort is normal. No respiratory distress.  Neurological:     Mental Status: She is alert.  Psychiatric:        Mood and Affect: Mood normal.        Behavior: Behavior normal.             Vital Signs: BP (!) 135/57 (BP Location: Left Arm)   Pulse 83   Temp 97.6 F (36.4 C) (Oral)   Resp 19   Ht 5' 5.98" (1.676 m)   Wt 79.8 kg   SpO2 100%   BMI 28.41 kg/m  SpO2: SpO2: 100 % O2 Device: O2 Device: Room Air O2 Flow Rate: O2 Flow Rate (L/min): 3 L/min      Palliative Assessment/Data:    Palliative Care Assessment & Plan   Patient Profile: 72 y.o. female  with past medical history of iron deficiency anemia, HTN, HLD, thrombocytopenia, COPD, CHF presented to ED on 08/18/22 with complaints of generalized weakness, nausea/vomiting, and abdominal pain. Patient was admitted on 08/18/2022 with acute on chronic IDA/blood loss anemia, incidental finding of age indeterminate infarct,  thrombocythemia, hypokalemia.  Neurology was consulted in the setting of incidental findings of acute stroke on MRI - it was felt strokes were cardiac source and TEE was recommended, which patient refused. Urology was consulted for management of right hydronephrosis - felt it is due to obstruction from pelvic mass; after mass was surgically removed, state kidney can not be obstructed for another 6 months without losing it. GI was consulted for anemia and abdominal imaging - plan was for colonoscopy and endoscopy, however patient did not tolerate the prep and upon further imaging CT showed free air with concern for perforation and Surgery was consulted - patient underwent emergent exlap, creation of mucus fistula, right hemicolectomy, colostomy, and biopsy of pelvic mass on 7/6. She was subsequently intubated, on pressors, and admitted to ICU after surgery. She was extubated on 7/8 but remained encephalopathic, which has been gradually improving. Her hospitalization has been complicated by septic shock secondary to peritonitis as well as acute PE. 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 7/11 patient was stable enough for transfer out of the ICU.   Palliative Medicine was consulted for GOC in context of malignancy and incomplete resection.  Assessment: Goals of care conversation Acute PE Multiple acute embolic strokes Acute hypoxic respiratory failure status post extubation, improved Septic shock secondary to acute peritonitis due to chronic perforation/obstructing pelvic mass AKI, resolved  Recommendations/Plan: Continue full code/full scope treatment Patient is not ready for hospice or end-of-life, she wishes to hear her options from oncology, focus on improvement Psychosocial and emotional support provided Completed FMLA paperwork for patient's son today Advanced directive documentation was introduced today, patient and family will continue to review and inform PMT if  interested in completion Ongoing goals of care discussions pending clinical course PMT will continue to follow and support   Prognosis: Poor long-term prognosis  Discharge Planning: To Be Determined  Care plan was discussed  with patient, patient's son, RN, primary attending, surgery PA-C   Total time: I spent 65 minutes in the care of the patient today in the above activities and documenting the encounter.  MDM high         Tauna Macfarlane Jeni Salles, PA-C  Palliative Medicine Team Team phone # [phone]  Thank you for allowing the Palliative Medicine Team to assist in the care of this patient. Please utilize secure chat with additional questions, if there is no response within 30 minutes please call the above phone number.  Palliative Medicine Team providers are available by phone from 7am to 7pm daily and can be reached through the team cell phone.  Should this patient require assistance outside of these hours, please call the patient's attending physician.

## 2022-08-29 NOTE — Plan of Care (Signed)
  Problem: Health Behavior/Discharge Planning: Goal: Ability to manage health-related needs will improve Outcome: Progressing   Problem: Clinical Measurements: Goal: Ability to maintain clinical measurements within normal limits will improve Outcome: Progressing Goal: Will remain free from infection Outcome: Progressing   Problem: Activity: Goal: Risk for activity intolerance will decrease Outcome: Progressing   Problem: Pain Managment: Goal: General experience of comfort will improve Outcome: Progressing   Problem: Safety: Goal: Ability to remain free from injury will improve Outcome: Progressing   Problem: Skin Integrity: Goal: Risk for impaired skin integrity will decrease Outcome: Progressing   

## 2022-08-29 NOTE — Progress Notes (Signed)
ANTICOAGULATION CONSULT NOTE  Pharmacy Consult for heparin  Indication: pulmonary embolus Brief A/P: Heparin level subtherapeutic Increase Heparin rate  Allergies  Allergen Reactions   Aspirin Other (See Comments)    Caused an asthma attack   Ibuprofen Swelling and Other (See Comments)    Angioedema and Asthma exacerbation   Sulfonamide Derivatives Swelling and Other (See Comments)    Face swelled SEVERELY   Penicillins Rash    Patient Measurements: Height: 5' 5.98" (167.6 cm) Weight: 78.9 kg (173 lb 15.1 oz) IBW/kg (Calculated) : 59.26 Heparin Dosing Weight: 75kg  Vital Signs: Temp: 98.2 F (36.8 C) (07/12 2316) Temp Source: Oral (07/12 2316) BP: 135/61 (07/13 0320) Pulse Rate: 88 (07/13 0320)  Labs: Recent Labs    08/27/22 0741 08/28/22 0753 08/28/22 1819 08/29/22 0436  HGB 10.7* 11.9*  --  9.9*  HCT 33.7* 38.8  --  32.4*  PLT 229 245  --  320  HEPARINUNFRC  --  <0.10* 0.13* 0.20*  CREATININE 0.99 0.94  --   --     Estimated Creatinine Clearance: 59 mL/min (by C-G formula based on SCr of 0.94 mg/dL).  Assessment: 72 y.o. female with PE for heparin  Goal of Therapy:  Heparin level 0.3-0.5 units/ml Monitor platelets by anticoagulation protocol: Yes  Plan:  Increase Heparin 1800 units/hr Check heparin level in 8 hours.   Geannie Risen, PharmD, BCPS  08/29/2022 5:23 AM

## 2022-08-29 NOTE — Progress Notes (Signed)
7 Days Post-Op  Subjective: CC: A&O x 3. No abdominal pain. Tolerating some cld and TFs at 57ml/hr without n/v. Having ostomy output - 1150cc/24 hours. Required I/O x 1 overnight.   Objective: Vital signs in last 24 hours: Temp:  [97.6 F (36.4 C)-98.8 F (37.1 C)] 97.6 F (36.4 C) (07/13 0836) Pulse Rate:  [82-98] 83 (07/13 0836) Resp:  [17-19] 19 (07/13 0836) BP: (115-137)/(57-76) 135/57 (07/13 0836) SpO2:  [98 %-100 %] 100 % (07/13 0836) Weight:  [79.8 kg] 79.8 kg (07/13 0500) Last BM Date : 08/28/22  Intake/Output from previous day: 07/12 0701 - 07/13 0700 In: -  Out: 2135 [Urine:900; Drains:85; Stool:1150] Intake/Output this shift: No intake/output data recorded.  PE: Gen:  Alert, NAD, pleasant Abd: Soft, mild distension, NT. Stoma and mucus fistula viable - ostomy appliance with thin dark brown output in bag.  Midline wound overall clean with some fibrinous tissue at the base with granulation tissue otherwise. JP drain with scant light brown purulent drainage.   Lab Results:  Recent Labs    08/28/22 0753 08/29/22 0436  WBC 19.9* 21.2*  HGB 11.9* 9.9*  HCT 38.8 32.4*  PLT 245 320   BMET Recent Labs    08/27/22 0741 08/28/22 0753  NA 138 139  K 4.1 4.0  CL 109 110  CO2 22 20*  GLUCOSE 142* 101*  BUN 34* 34*  CREATININE 0.99 0.94  CALCIUM 8.0* 7.9*   PT/INR No results for input(s): "LABPROT", "INR" in the last 72 hours. CMP     Component Value Date/Time   NA 139 08/28/2022 0753   K 4.0 08/28/2022 0753   CL 110 08/28/2022 0753   CO2 20 (L) 08/28/2022 0753   GLUCOSE 101 (H) 08/28/2022 0753   BUN 34 (H) 08/28/2022 0753   CREATININE 0.94 08/28/2022 0753   CALCIUM 7.9 (L) 08/28/2022 0753   PROT 4.6 (L) 08/23/2022 0922   ALBUMIN 2.4 (L) 08/23/2022 0922   AST 14 (L) 08/23/2022 0922   ALT 7 08/23/2022 0922   ALKPHOS 35 (L) 08/23/2022 0922   BILITOT 1.4 (H) 08/23/2022 0922   GFRNONAA >60 08/28/2022 0753   GFRAA >60 05/11/2015 0231   Lipase   No results found for: "LIPASE"  Studies/Results: CT Angio Chest Pulmonary Embolism (PE) W or WO Contrast  Result Date: 08/27/2022 CLINICAL DATA:  Pulmonary embolism (PE) suspected, high prob Weakness and shortness of breath. EXAM: CT ANGIOGRAPHY CHEST WITH CONTRAST TECHNIQUE: Multidetector CT imaging of the chest was performed using the standard protocol during bolus administration of intravenous contrast. Multiplanar CT image reconstructions and MIPs were obtained to evaluate the vascular anatomy. RADIATION DOSE REDUCTION: This exam was performed according to the departmental dose-optimization program which includes automated exposure control, adjustment of the mA and/or kV according to patient size and/or use of iterative reconstruction technique. CONTRAST:  75mL OMNIPAQUE IOHEXOL 350 MG/ML SOLN COMPARISON:  Radiograph 08/23/2022 FINDINGS: Cardiovascular: Positive for small volume acute pulmonary embolus, involving a subsegmental branch in the right upper lobe, for example series 7, image 99. Small amount of thrombus within right lower lobar pulmonary artery series 7, image 141, extending into the segmental and subsegmental branches. Thromboembolic burden is small to moderate. There is no right heart strain. Moderate calcified as well as noncalcified atheromatous plaque throughout the thoracic aorta, including rounded plaque in the descending aorta series 5, images 84 and 106. No aortic aneurysm. There are coronary artery calcifications. Heart size upper normal. Suggestion of lipomatous hypertrophy of the interatrial  septum. Small hiatal hernia. Mediastinum/Nodes: Enteric tube tip in the stomach. Well-circumscribed fluid density structure in the anterior mediastinum measures 2.1 x 2.1 cm series 5, image 57. Well-defined rounded fat density structures in the left suprahilar region, Hounsfield units of -124, 18 and 7 mm series 5, image 57. This appears to be contiguous with the mediastinal fat. Lungs/Pleura:  Small to moderate left and small right pleural effusion. Associated compressive atelectasis. No definite pulmonary infarct. 3-4 mm subpleural right upper lobe nodule has mild surrounding ground-glass, series 6, image 55. No endobronchial debris. Upper Abdomen: Small amount of free fluid in the left upper quadrant. Musculoskeletal: There are no acute or suspicious osseous abnormalities. Review of the MIP images confirms the above findings. IMPRESSION: 1. Positive for acute pulmonary embolus, involving a subsegmental branch in the right upper lobe and right lower lobar pulmonary artery. Thromboembolic burden is small to moderate. No right heart strain. 2. Small to moderate left and small right pleural effusions with compressive atelectasis. 3. Well-circumscribed fluid density structure in the anterior mediastinum measuring 2.1 x 2.1 cm. This may represent a thymic cyst. Recommend MRI for further assessment on an elective basis after resolution of acute event. 4. There is also a well-defined fat density structure in the left suprahilar region which appears to be contiguous with the mediastinal fat. Etiology is uncertain. 5. A 3-4 mm subpleural right upper lobe nodule has mild surrounding ground-glass. This is likely infectious or inflammatory. Per Fleischner Society Guidelines, no routine follow-up imaging is recommended. These guidelines do not apply to immunocompromised patients and patients with cancer. Follow up in patients with significant comorbidities as clinically warranted. For lung cancer screening, adhere to Lung-RADS guidelines. Reference: Radiology. 2017; 284(1):228-43. 6. Moderate calcified as well as noncalcified atheromatous plaque throughout the thoracic aorta, including rounded plaque in the descending aorta. 7. Aortic Atherosclerosis (ICD10-I70.0). Coronary artery calcifications. Critical Value/emergent results were called by telephone at the time of interpretation on 08/27/2022 at 6:56 PM to provider  Karie Fetch , who verbally acknowledged these results. Electronically Signed   By: Narda Rutherford M.D.   On: 08/27/2022 19:02   VAS [place] LOWER EXTREMITY VENOUS (DVT)  Result Date: 08/27/2022  Lower Venous DVT Study Patient Name:  Debra Sharp  Date of Exam:   08/27/2022 Medical Rec #: [id number]      Accession #:    [id number] Date of Birth: Feb 05, 1951      Patient Gender: F Patient Age:   38 years Exam Location:  [facility] Procedure:      VAS [place] LOWER EXTREMITY VENOUS (DVT) Referring Phys: Scheryl Marten XU --------------------------------------------------------------------------------  Indications: Hypercoagulable state.  Limitations: Body habitus and poor ultrasound/tissue interface. Comparison Study: No previous exams Performing Technologist: Jody Hill RVT, RDMS  Examination Guidelines: A complete evaluation includes B-mode imaging, spectral Doppler, color Doppler, and power Doppler as needed of all accessible portions of each vessel. Bilateral testing is considered an integral part of a complete examination. Limited examinations for reoccurring indications may be performed as noted. The reflux portion of the exam is performed with the patient in reverse Trendelenburg.  +---------+---------------+---------+-----------+----------+-------------------+ RIGHT    CompressibilityPhasicitySpontaneityPropertiesThrombus Aging      +---------+---------------+---------+-----------+----------+-------------------+ CFV      Full           Yes      Yes                                      +---------+---------------+---------+-----------+----------+-------------------+  SFJ      Full                                                             +---------+---------------+---------+-----------+----------+-------------------+ FV Prox  Full           Yes      Yes                                      +---------+---------------+---------+-----------+----------+-------------------+ FV Mid   Full            Yes      Yes                  Not well visualized +---------+---------------+---------+-----------+----------+-------------------+ FV DistalFull           Yes      Yes                                      +---------+---------------+---------+-----------+----------+-------------------+ PFV                     Yes      Yes                                      +---------+---------------+---------+-----------+----------+-------------------+ POP      Full           Yes      Yes                                      +---------+---------------+---------+-----------+----------+-------------------+ PTV                                                   Not visualized      +---------+---------------+---------+-----------+----------+-------------------+ PERO     Full                                         Not well visualized +---------+---------------+---------+-----------+----------+-------------------+   +---------+---------------+---------+-----------+----------+-------------------+ LEFT     CompressibilityPhasicitySpontaneityPropertiesThrombus Aging      +---------+---------------+---------+-----------+----------+-------------------+ CFV      Full           Yes      Yes                                      +---------+---------------+---------+-----------+----------+-------------------+ SFJ      Full                                                             +---------+---------------+---------+-----------+----------+-------------------+  FV Prox  Full           Yes      Yes                                      +---------+---------------+---------+-----------+----------+-------------------+ FV Mid   Full           Yes      Yes                  Not well visualized +---------+---------------+---------+-----------+----------+-------------------+ FV DistalFull           Yes      Yes                                       +---------+---------------+---------+-----------+----------+-------------------+ PFV                     Yes      Yes                                      +---------+---------------+---------+-----------+----------+-------------------+ POP      Full           Yes      Yes                                      +---------+---------------+---------+-----------+----------+-------------------+ PTV                                                   Not visualized      +---------+---------------+---------+-----------+----------+-------------------+ PERO                                                  Not visualized      +---------+---------------+---------+-----------+----------+-------------------+    Summary: BILATERAL: -No evidence of popliteal cyst, bilaterally. RIGHT: - There is no evidence of deep vein thrombosis in the lower extremity. However, portions of this examination were limited- see technologist comments above.  LEFT: - There is no evidence of deep vein thrombosis in the lower extremity. However, portions of this examination were limited- see technologist comments above.  *See table(s) above for measurements and observations.    Preliminary    US RENAL  Result Date: 08/27/2022 CLINICAL DATA:  Hydronephrosis EXAM: RENAL / URINARY TRACT ULTRASOUND COMPLETE COMPARISON:  CT abdomen pelvis 08/22/2022 FINDINGS: Right Kidney: Renal measurements: 11.1 x 5.8 x 5.4 cm = volume: 181 mL. Diffusely thinned cortex. Unchanged severe hydronephrosis. Left Kidney: Renal measurements: 11.9 x 8.4 x 5.4 cm = volume: 16 mL. Echogenicity within normal limits. No mass or hydronephrosis visualized. Bladder: Not visualized. Other: None. IMPRESSION: Unchanged severe right hydronephrosis. Electronically Signed   By: Acquanetta Belling M.D.   On: 08/27/2022 12:00    Anti-infectives: Anti-infectives (From admission, onward)    Start     Dose/Rate Route Frequency Ordered Stop   08/23/22 2100  ceFEPIme  (  MAXIPIME) 2 g in sodium chloride 0.9 % 100 mL IVPB        2 g 200 mL/hr over 30 Minutes Intravenous Every 12 hours 08/23/22 1357     08/23/22 0400  micafungin (MYCAMINE) 100 mg in sodium chloride 0.9 % 100 mL IVPB        100 mg 105 mL/hr over 1 Hours Intravenous Daily 08/23/22 0242     08/22/22 1800  metroNIDAZOLE (FLAGYL) IVPB 500 mg        500 mg 100 mL/hr over 60 Minutes Intravenous Every 12 hours 08/22/22 1711     08/22/22 1800  ceFEPIme (MAXIPIME) 2 g in sodium chloride 0.9 % 100 mL IVPB  Status:  Discontinued        2 g 200 mL/hr over 30 Minutes Intravenous Every 8 hours 08/22/22 1716 08/23/22 1357   08/22/22 1724  metroNIDAZOLE (FLAGYL) 500 MG/100ML IVPB       Note to Pharmacy: [name]: cabinet override      08/22/22 1724 08/23/22 0845   08/19/22 1030  cefTRIAXone (ROCEPHIN) 1 g in sodium chloride 0.9 % 100 mL IVPB  Status:  Discontinued        1 g 200 mL/hr over 30 Minutes Intravenous Every 24 hours 08/19/22 0937 08/22/22 1715        Assessment/Plan POD 7 s/p exploratory laparotomy, right hemicolectomy, ileostomy, mucus fistula, biopsy of pelvic mass by Dr. 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 Margins negative. 0/43 lymph nodes.  - CEA - 143, CA 125: 40 and CA 19-9: 50 - Has had CT CAP since admission - Recommend oncology consult to make sure she has f/u - Having ileostomy output. Tolerating TF's and CLD. SLP following for diet advancement.  - WOC for stoma teaching and assistance with pouching. - BID wet to dry to midline - Afebrile. WBC 19 > 21.2 today. Cont abx. Drain still purulent. If worsening leukocytosis or develops fever would consider CT A/P   New stroke - Seen on MRI 7/10, suspected thromboembolic, defer to primary team/stroke team for further w/u and management. Will discuss with MD when okay for Plavix - would at  least wait the next 24 hours till we determine if needs CT A/P. PE - heparin drip per primary. Would wait to switch to DOAC for at least wait the next 24 hours till we determine if needs CT A/P.    FEN: CLD per SLP, TF's  VTE: SCDs, Heparin gtt ID: Cefepime/flagyl/micafungin, monitor WBC and drain output Foley: Removed, I/O overnight. Discussed with RN to do bladder scans every 6 hours.  Dispo - Therapies. Family meeting w/ palliative today at 11am per notes. Monitor wbc.      LOS: 11 days    Debra Sharp , [facility] Surgery 08/29/2022, 9:33 AM Please see Amion for pager number during day hours 7:00am-4:30pm

## 2022-08-29 NOTE — Progress Notes (Signed)
ANTICOAGULATION CONSULT NOTE  Pharmacy Consult for heparin  Indication: pulmonary embolus  Allergies  Allergen Reactions   Aspirin Other (See Comments)    Caused an asthma attack   Ibuprofen Swelling and Other (See Comments)    Angioedema and Asthma exacerbation   Sulfonamide Derivatives Swelling and Other (See Comments)    Face swelled SEVERELY   Penicillins Rash    Patient Measurements: Height: 5' 5.98" (167.6 cm) Weight: 79.8 kg (175 lb 14.8 oz) IBW/kg (Calculated) : 59.26 Heparin Dosing Weight: 75kg  Vital Signs: Temp: 97.7 F (36.5 C) (07/13 1156) Temp Source: Oral (07/13 1156) BP: 129/65 (07/13 1156) Pulse Rate: 94 (07/13 1156)  Labs: Recent Labs    08/27/22 0741 08/27/22 0741 08/28/22 0753 08/28/22 1819 08/29/22 0436 08/29/22 1337  HGB 10.7*  --  11.9*  --  9.9*  --   HCT 33.7*  --  38.8  --  32.4*  --   PLT 229  --  245  --  320  --   HEPARINUNFRC  --    < > <0.10* 0.13* 0.20* 0.26*  CREATININE 0.99  --  0.94  --   --   --    < > = values in this interval not displayed.    Estimated Creatinine Clearance: 59.3 mL/min (by C-G formula based on SCr of 0.94 mg/dL).  Assessment: 72 y.o. female with PE for heparin.  HL 0.26, subtherapeutic. Hgb 9.9, plts 320. No issues with infusion or s/sx of bleeding per RN.  Goal of Therapy:  Heparin level 0.3-0.5 units/ml Monitor platelets by anticoagulation protocol: Yes  Plan:  Increase Heparin to 1900 units/hr Check heparin level in 8 hours.  Daily CBC and heparin level.  Nicole Kindred, PharmD PGY1 Pharmacy Resident 08/29/2022 2:56 PM

## 2022-08-30 ENCOUNTER — Inpatient Hospital Stay (HOSPITAL_COMMUNITY): Payer: Medicare Other

## 2022-08-30 DIAGNOSIS — K922 Gastrointestinal hemorrhage, unspecified: Secondary | ICD-10-CM | POA: Diagnosis not present

## 2022-08-30 DIAGNOSIS — Z7189 Other specified counseling: Secondary | ICD-10-CM | POA: Diagnosis not present

## 2022-08-30 DIAGNOSIS — E440 Moderate protein-calorie malnutrition: Secondary | ICD-10-CM | POA: Diagnosis not present

## 2022-08-30 DIAGNOSIS — K631 Perforation of intestine (nontraumatic): Secondary | ICD-10-CM | POA: Diagnosis not present

## 2022-08-30 DIAGNOSIS — I6310 Cerebral infarction due to embolism of unspecified precerebral artery: Secondary | ICD-10-CM | POA: Diagnosis not present

## 2022-08-30 LAB — BASIC METABOLIC PANEL
Anion gap: 10 (ref 5–15)
BUN: 30 mg/dL — ABNORMAL HIGH (ref 8–23)
CO2: 21 mmol/L — ABNORMAL LOW (ref 22–32)
Calcium: 7.5 mg/dL — ABNORMAL LOW (ref 8.9–10.3)
Chloride: 106 mmol/L (ref 98–111)
Creatinine, Ser: 0.79 mg/dL (ref 0.44–1.00)
GFR, Estimated: >60 mL/min (ref >60)
Glucose, Bld: 127 mg/dL — ABNORMAL HIGH (ref 70–99)
Potassium: 4 mmol/L (ref 3.5–5.1)
Sodium: 137 mmol/L (ref 135–145)

## 2022-08-30 LAB — CBC
HCT: 31.1 % — ABNORMAL LOW (ref 36.0–46.0)
Hemoglobin: 9.2 g/dL — ABNORMAL LOW (ref 12.0–15.0)
MCH: 24.3 pg — ABNORMAL LOW (ref 26.0–34.0)
MCHC: 29.6 g/dL — ABNORMAL LOW (ref 30.0–36.0)
MCV: 82.3 fL (ref 80.0–100.0)
Platelets: 467 10*3/uL — ABNORMAL HIGH (ref 150–400)
RBC: 3.78 MIL/uL — ABNORMAL LOW (ref 3.87–5.11)
RDW: 27.6 % — ABNORMAL HIGH (ref 11.5–15.5)
WBC: 24.5 10*3/uL — ABNORMAL HIGH (ref 4.0–10.5)
nRBC: 0 % (ref 0.0–0.2)

## 2022-08-30 LAB — HEPARIN LEVEL (UNFRACTIONATED)
Heparin Unfractionated: 0.23 IU/mL — ABNORMAL LOW (ref 0.30–0.70)
Heparin Unfractionated: 0.31 IU/mL (ref 0.30–0.70)
Heparin Unfractionated: 0.37 IU/mL (ref 0.30–0.70)
Heparin Unfractionated: 0.46 IU/mL (ref 0.30–0.70)

## 2022-08-30 LAB — MAGNESIUM: Magnesium: 1.8 mg/dL (ref 1.7–2.4)

## 2022-08-30 LAB — PHOSPHORUS: Phosphorus: 2.4 mg/dL — ABNORMAL LOW (ref 2.5–4.6)

## 2022-08-30 LAB — GLUCOSE, CAPILLARY
Glucose-Capillary: 120 mg/dL — ABNORMAL HIGH (ref 70–99)
Glucose-Capillary: 125 mg/dL — ABNORMAL HIGH (ref 70–99)
Glucose-Capillary: 127 mg/dL — ABNORMAL HIGH (ref 70–99)
Glucose-Capillary: 78 mg/dL (ref 70–99)
Glucose-Capillary: 82 mg/dL (ref 70–99)
Glucose-Capillary: 83 mg/dL (ref 70–99)

## 2022-08-30 MED ORDER — CALCIUM POLYCARBOPHIL 625 MG PO TABS
625.0000 mg | ORAL_TABLET | Freq: Two times a day (BID) | ORAL | Status: DC
  Administered 2022-08-30 – 2022-08-31 (×3): 625 mg via ORAL
  Filled 2022-08-30 (×3): qty 1

## 2022-08-30 MED ORDER — SODIUM CHLORIDE 0.9 % IV SOLN
2.0000 g | Freq: Three times a day (TID) | INTRAVENOUS | Status: DC
  Administered 2022-08-30 – 2022-09-06 (×21): 2 g via INTRAVENOUS
  Filled 2022-08-30 (×21): qty 12.5

## 2022-08-30 MED ORDER — K PHOS MONO-SOD PHOS DI & MONO 155-852-130 MG PO TABS
500.0000 mg | ORAL_TABLET | Freq: Three times a day (TID) | ORAL | Status: DC
  Administered 2022-08-30 – 2022-08-31 (×4): 500 mg via ORAL
  Filled 2022-08-30 (×4): qty 2

## 2022-08-30 MED ORDER — IOHEXOL 350 MG/ML SOLN
75.0000 mL | Freq: Once | INTRAVENOUS | Status: AC | PRN
  Administered 2022-08-30: 75 mL via INTRAVENOUS

## 2022-08-30 MED ORDER — IOHEXOL 9 MG/ML PO SOLN
500.0000 mL | ORAL | Status: AC
  Administered 2022-08-30 (×2): 500 mL via ORAL

## 2022-08-30 NOTE — Progress Notes (Signed)
ANTICOAGULATION CONSULT NOTE  Pharmacy Consult for heparin  Indication: pulmonary embolus  Allergies  Allergen Reactions   Aspirin Other (See Comments)    Caused an asthma attack   Ibuprofen Swelling and Other (See Comments)    Angioedema and Asthma exacerbation   Sulfonamide Derivatives Swelling and Other (See Comments)    Face swelled SEVERELY   Penicillins Rash    Patient Measurements: Height: 5' 5.98" (167.6 cm) Weight: 80 kg (176 lb 5.9 oz) IBW/kg (Calculated) : 59.26 Heparin Dosing Weight: 75kg  Vital Signs: Temp: 98.1 F (36.7 C) (07/14 1200) Temp Source: Oral (07/14 1200) BP: 155/75 (07/14 1200) Pulse Rate: 102 (07/14 1200)  Labs: Recent Labs    08/28/22 0753 08/28/22 1819 08/29/22 0436 08/29/22 1337 08/29/22 2345 08/30/22 0123 08/30/22 1059  HGB 11.9*  --  9.9*  --   --  9.2*  --   HCT 38.8  --  32.4*  --   --  31.1*  --   PLT 245  --  320  --   --  467*  --   HEPARINUNFRC <0.10*   < > 0.20*   < > 0.31 0.23* 0.46  CREATININE 0.94  --   --   --   --  0.79  --    < > = values in this interval not displayed.    Estimated Creatinine Clearance: 69.8 mL/min (by C-G formula based on SCr of 0.79 mg/dL).  Assessment: 72 y.o. female with PE for heparin.  HL 0.46, therapeutic. Hgb 9.2, plts 467. No issues with infusion or s/sx of bleeding per RN.  Goal of Therapy:  Heparin level 0.3-0.5 units/ml Monitor platelets by anticoagulation protocol: Yes  Plan:  Continue heparin at 2000 units/hr Confirmatory heparin level in 8 hours Monitor daily CBC, heparin level, and s/sx of bleeding.  Nicole Kindred, PharmD PGY1 Pharmacy Resident 08/30/2022 12:52 PM

## 2022-08-30 NOTE — Progress Notes (Signed)
Midabdominal wound wet to dry dressing change performed per MD order without difficulty.

## 2022-08-30 NOTE — Progress Notes (Addendum)
8 Days Post-Op  Subjective: CC: No abdominal pain. Tolerating some cld and TFs at 76ml/hr without n/v. Having ostomy output - 1300cc/24 hours.   Afebrile. Tachycardic overnight. No systolic hypotension. WBC up at 24.5.   Objective: Vital signs in last 24 hours: Temp:  [97.6 F (36.4 C)-98.1 F (36.7 C)] 97.6 F (36.4 C) (07/14 0800) Pulse Rate:  [92-103] 96 (07/14 0800) Resp:  [14-20] 18 (07/14 0800) BP: (129-155)/(57-73) 155/73 (07/14 0800) SpO2:  [99 %-100 %] 100 % (07/14 0800) Weight:  [80 kg] 80 kg (07/14 0348) Last BM Date : 08/30/22  Intake/Output from previous day: 07/13 0701 - 07/14 0700 In: 1316.9 [P.O.:60; I.V.:251.9; NG/GT:805; IV Piggyback:200] Out: 3060 [Urine:1700; Drains:60; Stool:1300] Intake/Output this shift: Total I/O In: -  Out: 570 [Drains:70; Stool:500]  PE: Gen:  Alert, NAD, pleasant Abd: Soft, mild distension, some left sided ttp. Stoma and mucus fistula viable - ostomy appliance with thin dark brown output in bag.  Midline wound overall clean with some fibrinous tissue at the base with granulation tissue otherwise. JP drain less purulent and more feculent appearing today.   Lab Results:  Recent Labs    08/29/22 0436 08/30/22 0123  WBC 21.2* 24.5*  HGB 9.9* 9.2*  HCT 32.4* 31.1*  PLT 320 467*   BMET Recent Labs    08/28/22 0753 08/30/22 0123  NA 139 137  K 4.0 4.0  CL 110 106  CO2 20* 21*  GLUCOSE 101* 127*  BUN 34* 30*  CREATININE 0.94 0.79  CALCIUM 7.9* 7.5*   PT/INR No results for input(s): "LABPROT", "INR" in the last 72 hours. CMP     Component Value Date/Time   NA 137 08/30/2022 0123   K 4.0 08/30/2022 0123   CL 106 08/30/2022 0123   CO2 21 (L) 08/30/2022 0123   GLUCOSE 127 (H) 08/30/2022 0123   BUN 30 (H) 08/30/2022 0123   CREATININE 0.79 08/30/2022 0123   CALCIUM 7.5 (L) 08/30/2022 0123   PROT 4.6 (L) 08/23/2022 0922   ALBUMIN 2.4 (L) 08/23/2022 0922   AST 14 (L) 08/23/2022 0922   ALT 7 08/23/2022 0922    ALKPHOS 35 (L) 08/23/2022 0922   BILITOT 1.4 (H) 08/23/2022 0922   GFRNONAA >60 08/30/2022 0123   GFRAA >60 05/11/2015 0231   Lipase  No results found for: "LIPASE"  Studies/Results: No results found.  Anti-infectives: Anti-infectives (From admission, onward)    Start     Dose/Rate Route Frequency Ordered Stop   08/30/22 0830  ceFEPIme (MAXIPIME) 2 g in sodium chloride 0.9 % 100 mL IVPB        2 g 200 mL/hr over 30 Minutes Intravenous Every 8 hours 08/30/22 0826     08/23/22 2100  ceFEPIme (MAXIPIME) 2 g in sodium chloride 0.9 % 100 mL IVPB  Status:  Discontinued        2 g 200 mL/hr over 30 Minutes Intravenous Every 12 hours 08/23/22 1357 08/30/22 0826   08/23/22 0400  micafungin (MYCAMINE) 100 mg in sodium chloride 0.9 % 100 mL IVPB        100 mg 105 mL/hr over 1 Hours Intravenous Daily 08/23/22 0242     08/22/22 1800  metroNIDAZOLE (FLAGYL) IVPB 500 mg        500 mg 100 mL/hr over 60 Minutes Intravenous Every 12 hours 08/22/22 1711     08/22/22 1800  ceFEPIme (MAXIPIME) 2 g in sodium chloride 0.9 % 100 mL IVPB  Status:  Discontinued  2 g 200 mL/hr over 30 Minutes Intravenous Every 8 hours 08/22/22 1716 08/23/22 1357   08/22/22 1724  metroNIDAZOLE (FLAGYL) 500 MG/100ML IVPB       Note to Pharmacy: [name]: cabinet override      08/22/22 1724 08/23/22 0845   08/19/22 1030  cefTRIAXone (ROCEPHIN) 1 g in sodium chloride 0.9 % 100 mL IVPB  Status:  Discontinued        1 g 200 mL/hr over 30 Minutes Intravenous Every 24 hours 08/19/22 0937 08/22/22 1715        Assessment/Plan POD 8 s/p exploratory laparotomy, right hemicolectomy, ileostomy, mucus fistula, biopsy of pelvic mass by Dr. 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 Margins negative. 0/43 lymph nodes.  - CEA - 143, CA 125: 40 and CA 19-9: 50 - Has had CT CAP since  admission - Recommend oncology consult to make sure she has f/u - High ileostomy output. Start fiber. Monitor output, Cr, electrolytes. - WOC for stoma teaching and assistance with pouching. - BID wet to dry to midline - WBC up today and drain now more feculent appearing today. Will get CT A/P to eval. Discussed plan with RN.    New stroke - Seen on MRI 7/10, suspected thromboembolic, defer to primary team/stroke team for further w/u and management. Will discuss with MD when okay for Plavix - would at least wait the next 24 hours till we determine if needs CT A/P. PE - heparin drip per primary. Would wait to switch to DOAC for at least wait the next 24 hours till we determine if needs CT A/P.    FEN: NPO. Hold TF's. IVF per TRH VTE: SCDs, Heparin gtt ID: Cefepime/flagyl/micafungin Foley: Removed, Monitor.  Dispo - CT     LOS: 12 days    Jacinto Halim , [facility] 08/30/2022, 10:07 AM Please see Amion for pager number during day hours 7:00am-4:30pm

## 2022-08-30 NOTE — Progress Notes (Signed)
ANTICOAGULATION CONSULT NOTE  Pharmacy Consult for heparin  Indication: pulmonary embolus  Allergies  Allergen Reactions   Aspirin Other (See Comments)    Caused an asthma attack   Ibuprofen Swelling and Other (See Comments)    Angioedema and Asthma exacerbation   Sulfonamide Derivatives Swelling and Other (See Comments)    Face swelled SEVERELY   Penicillins Rash    Patient Measurements: Height: 5' 5.98" (167.6 cm) Weight: 79.8 kg (175 lb 14.8 oz) IBW/kg (Calculated) : 59.26 Heparin Dosing Weight: 75kg  Vital Signs: Temp: 97.8 F (36.6 C) (07/13 1959) Temp Source: Oral (07/13 1959) BP: 142/61 (07/14 0000) Pulse Rate: 95 (07/14 0000)  Labs: Recent Labs    08/27/22 0741 08/28/22 0753 08/28/22 1819 08/29/22 0436 08/29/22 1337 08/29/22 2345  HGB 10.7* 11.9*  --  9.9*  --   --   HCT 33.7* 38.8  --  32.4*  --   --   PLT 229 245  --  320  --   --   HEPARINUNFRC  --  <0.10*   < > 0.20* 0.26* 0.31  CREATININE 0.99 0.94  --   --   --   --    < > = values in this interval not displayed.    Estimated Creatinine Clearance: 59.3 mL/min (by C-G formula based on SCr of 0.94 mg/dL).  Assessment: 72 y.o. female with PE for heparin.  HL 0.26, subtherapeutic. Hgb 9.9, plts 320. No issues with infusion or s/sx of bleeding per RN.  7/14 AM update:  Heparin level therapeutic after rate increase  Goal of Therapy:  Heparin level 0.3-0.5 units/ml Monitor platelets by anticoagulation protocol: Yes  Plan:  Cont Heparin at 1900 units/hr Check heparin level with AM labs  Abran Duke, PharmD, BCPS Clinical Pharmacist Phone: [phone]

## 2022-08-30 NOTE — Progress Notes (Addendum)
Pharmacy Antibiotic Note  Debra Sharp is a 72 y.o. female admitted on 08/18/2022 with septic shock/colonic perforation secondary to obstructive pelvis mass, UTI. Pharmacy has been consulted for Cefepime dosing. Day 8 of broad spectrum abx. Leukocytosis, afebrile. Scr 0.79.  Micro results: 7/6 BCx (-) 7/6 MRSA PCR (-)  Plan: Adjust cefepime to 2g IV Q8H for improved renal function Flagyl 500mg  IV Q12H and Micafungin 100mg  IV Q24H per MD Monitor WBC, temp, renal function, abx length of therapy  Height: 5' 5.98" (167.6 cm) Weight: 80 kg (176 lb 5.9 oz) IBW/kg (Calculated) : 59.26  Temp (24hrs), Avg:97.9 F (36.6 C), Min:97.6 F (36.4 C), Max:98.1 F (36.7 C)  Recent Labs  Lab 08/25/22 0436 08/26/22 0512 08/27/22 0741 08/28/22 0753 08/29/22 0436 08/30/22 0123  WBC 37.2* 25.7* 19.4* 19.9* 21.2* 24.5*  CREATININE 1.26* 1.17* 0.99 0.94  --  0.79    Estimated Creatinine Clearance: 69.8 mL/min (by C-G formula based on SCr of 0.79 mg/dL).    Allergies  Allergen Reactions   Aspirin Other (See Comments)    Caused an asthma attack   Ibuprofen Swelling and Other (See Comments)    Angioedema and Asthma exacerbation   Sulfonamide Derivatives Swelling and Other (See Comments)    Face swelled SEVERELY   Penicillins Rash   Nicole Kindred, PharmD PGY1 Pharmacy Resident 08/30/2022 8:23 AM

## 2022-08-30 NOTE — Progress Notes (Addendum)
PROGRESS NOTE  Debra Sharp  ZOX:[id number] DOB: [birth year] DOA: 08/18/2022 PCP: Clayborn Heron, MD   Brief Narrative:  Patient is a 72 year old female with past medical history of hypertension, hyperlipidemia, systolic CHF, thrombocytopenia, COPD, asthma who initially presented with weakness, abdominal pain.  Workup showed anemia concerning for upper GI bleed.  CT abdomen/pelvis showed colonic distention, possible obstruction.  GI consulted, plan was for EGD but she could not tolerate prep.  Also found to have right-sided hydronephrosis, incidental acute stroke on CT/MRI.  Repeat CT abdomen/pelvis on 7/6 showed new intraperitoneal free air with presumed perforation of the colon.  Surgery consulted and she underwent emergent exploratory laparotomy, right hemicolectomy, ileostomy with fistula, biopsy of pelvic mass.  Patient was left intubated and was transferred to ICU for pain management.  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  Palliative  care consulted.  General surgery following.  Currently on tube feeding.  Hospital course also remarkable for finding of acute PE now on heparin drip.  Now extubated and transferred to [facility] service on 7/12.  Plan was for TEE which has been canceled because patient declined.  Palliative care, urology ,surgery ,neurology were following.  PT/OT recommending SNF on discharge.  Hospital course remarkable for persistent leukocytosis.  Assessment & Plan:  Principal Problem:   GI bleed Active Problems:   Symptomatic anemia   Thrombocytosis   Iron deficiency anemia   Abnormal CT of the abdomen   Colon distention   History of cardioembolic cerebrovascular accident (CVA)   Silent cerebral infarction (HCC)   Bowel perforation (HCC)   Ventilator dependent (HCC)   Pelvic mass   Change in bowel habits   Abnormal CT scan, gastrointestinal tract   Arterial hypotension   Septic shock (HCC)   Fecal occult blood test positive   Uterine leiomyoma    Hydroureteronephrosis   Sepsis with acute hypoxic respiratory failure and septic shock (HCC)   Encephalopathy acute   Perforation bowel (HCC)   Malnutrition of moderate degree   Cerebrovascular accident (CVA) due to embolism of precerebral artery (HCC)   Acute metabolic encephalopathy   Delirium  Acute hypoxic respiratory failure: Intubated postoperatively.  Currently extubated and on room air.    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  Currently in broad spectrum antibiotics with micafungin, Flagyl, cefepime.  Wound  cultures NGTD.  On tube feed.Speech therapy consulted to advance the diet by mouth  Currently hemodynamically stable.  General surgery following.  Leukocytosis persist.  She is afebrile.  Denies abdomen, nausea or vomiting.  Tolerating clear liquid diet.  I will defer to general surgery for making decision on CT abdomen/pelvis for follow-up for leukocytosis  Pelvic mass: Culprit for colonic perforation.  Initially concern for gynecological malignancy with elevated CA125, CA 19-19 but pathology showed small bowel primary.   General surgery recommending arranging outpatient follow-up with oncology before discharge, no need for immediate consultation.  Will make sure oncology knows about the follow-up appointment before discharge.   Acute metabolic encephalopathy: Likely from sepsis, ICU delirium, CVAs.  Currently alert and oriented.    Acute CVA: MRI done on 7/4 showed single punctuate enhancement in the right frontoparietal area, no evidence of mets.  EEG done on 7/9 did not show any seizure.  Suspected to be embolic CVA versus secondary to hypoperfusion in the setting of anemia/septic  shock.  Neurology were following.  There was plan for TEE but patient has declined.  Plan  to start on Plavix at some point.  Continue statin.  PT/OT/SLP following.  Recommended SNF on discharge She has global weakness.  Hardly  able to get up from the bed.  Acute PE: CT angiogram showed acute PE.  Currently on heparin drip.  No evidence of right heart strain.  Will start her on Eliquis when surgery clears  AKI/hyperkalemia/hypophosphatemia:   Currently kidney function is stable.Phosphorus being supplemented  Hydronephrosis secondary to abdominal mass: Urology following.  Repeat ultrasound shows unchanged right-sided hydronephrosis.  Patient declined ureteral stent  Acute on chronic renal disease anemia/acute blood loss anemia secondary to surgery/chronic GI bleed due to small bowel adenocarcinoma: Monitor hemoglobin.  Transfuse if less than 7.  Currently on PPI.  Currently hemoglobin is stable   Hypertension: Off hypertensives.  Monitor blood pressure  Hyperlipidemia: Currently on Lipitor  Chronic systolic CHF: Echo on 08/18/2020 showed EF of 40 to 45%, global hypokinesis, normal right ventricular function.  Previous echo on March 2024 showed EF of 30 to 35%.  We recommend follow-up with cardiology as an outpatient  Hyperglycemia: Currently on sliding scale insulin.  Goals of care: Multiple comorbidities.  General surgery recommended palliative care consulted for new finding of malignancy, and incomplete restriction.  Palliative care are following.  Remains full code.         Nutrition Problem: Moderate Malnutrition Etiology: chronic illness    DVT prophylaxis:SCDs Start: 08/18/22 1710     Code Status: Full Code  Family Communication: Discussed with son at bedside on 7/14  Patient status:Inpatient  Patient is from :home  Anticipated discharge to:SnF  Estimated DC date:after general surgery clearance   Consultants: General surgery, PCCM, palliative care, neurology  Procedures: Exploratory laparotomy  Antimicrobials:  Anti-infectives (From admission, onward)     Start     Dose/Rate Route Frequency Ordered Stop   08/30/22 0830  ceFEPIme (MAXIPIME) 2 g in sodium chloride 0.9 % 100 mL IVPB        2 g 200 mL/hr over 30 Minutes Intravenous Every 8 hours 08/30/22 0826     08/23/22 2100  ceFEPIme (MAXIPIME) 2 g in sodium chloride 0.9 % 100 mL IVPB  Status:  Discontinued        2 g 200 mL/hr over 30 Minutes Intravenous Every 12 hours 08/23/22 1357 08/30/22 0826   08/23/22 0400  micafungin (MYCAMINE) 100 mg in sodium chloride 0.9 % 100 mL IVPB        100 mg 105 mL/hr over 1 Hours Intravenous Daily 08/23/22 0242     08/22/22 1800  metroNIDAZOLE (FLAGYL) IVPB 500 mg        500 mg 100 mL/hr over 60 Minutes Intravenous Every 12 hours 08/22/22 1711     08/22/22 1800  ceFEPIme (MAXIPIME) 2 g in sodium chloride 0.9 % 100 mL IVPB  Status:  Discontinued        2 g 200 mL/hr over 30 Minutes Intravenous Every 8 hours 08/22/22 1716 08/23/22 1357   08/22/22 1724  metroNIDAZOLE (FLAGYL) 500 MG/100ML IVPB       Note to Pharmacy: [name]: cabinet override      08/22/22 1724 08/23/22 0845   08/19/22 1030  cefTRIAXone (ROCEPHIN) 1 g in sodium chloride 0.9 % 100 mL IVPB  Status:  Discontinued        1 g 200 mL/hr over 30 Minutes Intravenous Every 24 hours 08/19/22 0937  08/22/22 1715       Subjective: Patient seen and examined at bedside today.  Hemodynamically stable . Denies any nausea, vomiting or abdominal pain.  Lying in bed, weak.  Brown stool in the ostomy pouch, JP drain with brown/black fluid.  No fever.  Son at bedside.  Tolerating clear liquid diet  Objective: Vitals:   08/30/22 0348 08/30/22 0400 08/30/22 0600 08/30/22 0800  BP:  (!) 154/65  (!) 155/73  Pulse:  (!) 103 92 96  Resp:  18 14 18   Temp:  98.1 F (36.7 C)  97.6 F (36.4 C)  TempSrc:  Oral  Oral  SpO2:  100% 100% 100%  Weight: 80 kg     Height:        Intake/Output Summary (Last 24 hours) at 08/30/2022 0949 Last data filed at 08/30/2022 0900 Gross per 24 hour  Intake  1316.92 ml  Output 3630 ml  Net -2313.08 ml   Filed Weights   08/28/22 0356 08/29/22 0500 08/30/22 0348  Weight: 78.9 kg 79.8 kg 80 kg    Examination:   General exam: Overall comfortable, not in distress,weak,lying on bed HEENT: PERRL,feeding tube Respiratory system:  no wheezes or crackles  Cardiovascular system: S1 & S2 heard, RRR.  Gastrointestinal system: Abdomen is nondistended, soft and nontender.  Ostomy pouch with some brown stool.  Left drain with brown/black fluid Central nervous system: Alert and oriented Extremities: bilateral lower extremity edema, no clubbing ,no cyanosis Skin: No rashes, no ulcers,no icterus       Data Reviewed: I have personally reviewed following labs and imaging studies  CBC: Recent Labs  Lab 08/26/22 0512 08/27/22 0741 08/28/22 0753 08/29/22 0436 08/30/22 0123  WBC 25.7* 19.4* 19.9* 21.2* 24.5*  HGB 9.0* 10.7* 11.9* 9.9* 9.2*  HCT 28.5* 33.7* 38.8 32.4* 31.1*  MCV 80.3 79.7* 83.8 80.6 82.3  PLT 224 229 245 320 467*   Basic Metabolic Panel: Recent Labs  Lab 08/25/22 0436 08/26/22 0512 08/26/22 1659 08/27/22 0741 08/27/22 1712 08/28/22 0753 08/29/22 0436 08/30/22 0123  NA 135 138  --  138  --  139  --  137  K 4.5 4.1  --  4.1  --  4.0  --  4.0  CL 105 105  --  109  --  110  --  106  CO2 23 23  --  22  --  20*  --  21*  GLUCOSE 126* 96  --  142*  --  101*  --  127*  BUN 31* 34*  --  34*  --  34*  --  30*  CREATININE 1.26* 1.17*  --  0.99  --  0.94  --  0.79  CALCIUM 7.9* 8.2*  --  8.0*  --  7.9*  --  7.5*  MG  --  2.1 2.1 2.0 1.9  --  1.7 1.8  PHOS  --  3.5 2.9 2.6 1.9*  --  2.6 2.4*     Recent Results (from the past 240 hour(s))  MRSA Next Gen by PCR, Nasal     Status: None   Collection Time: 08/22/22  8:15 PM   Specimen: Nasal Mucosa; Nasal Swab  Result Value Ref Range Status   MRSA by PCR Next Gen NOT DETECTED NOT DETECTED Final    Comment: (NOTE) The GeneXpert MRSA Assay (FDA approved for NASAL specimens  only), is one component of a comprehensive MRSA colonization surveillance program. It is not intended to diagnose MRSA infection nor to guide  or monitor treatment for MRSA infections. Test performance is not FDA approved in patients less than 73 years old. Performed at [facility], [address], [place], [place] [address]   Culture, blood (Routine X 2) w Reflex to ID Panel     Status: None   Collection Time: 08/22/22  8:52 PM   Specimen: BLOOD  Result Value Ref Range Status   Specimen Description BLOOD  Final   Special Requests   Final    LINE DRAW BOTTLES DRAWN AEROBIC AND ANAEROBIC Blood Culture adequate volume   Culture   Final    NO GROWTH 5 DAYS Performed at [facility], [address], [place], [place] [address]    Report Status 08/27/2022 FINAL  Final  Culture, blood (Routine X 2) w Reflex to ID Panel     Status: None   Collection Time: 08/22/22  9:08 PM   Specimen: BLOOD  Result Value Ref Range Status   Specimen Description BLOOD  Final   Special Requests   Final    LINE DRAW BOTTLES DRAWN AEROBIC AND ANAEROBIC Blood Culture adequate volume   Culture   Final    NO GROWTH 5 DAYS Performed at [facility], [address], [place], [place] [address]    Report Status 08/27/2022 FINAL  Final     Radiology Studies: No results found.  Scheduled Meds:  acetaminophen (TYLENOL) oral liquid 160 mg/5 mL  650 mg Per Tube Q6H   arformoterol  15 mcg Nebulization BID   atorvastatin  20 mg Per Tube Daily   budesonide (PULMICORT) nebulizer solution  0.5 mg Nebulization BID   feeding supplement (PROSource TF20)  60 mL Per Tube Daily   insulin aspart  0-9 Units Subcutaneous Q4H   multivitamin with minerals  1 tablet Per Tube Daily   pantoprazole (PROTONIX) IV  40 mg Intravenous Q12H   phosphorus  500 mg Oral TID   polycarbophil  625 mg Oral BID   revefenacin  175 mcg Nebulization Daily   tamsulosin  0.4 mg Oral Daily   Continuous Infusions:  sodium  chloride Stopped (08/29/22 1855)   ceFEPime (MAXIPIME) IV     feeding supplement (VITAL 1.5 CAL) 50 mL/hr at 08/30/22 0700   heparin 2,000 Units/hr (08/30/22 0919)   metronidazole Stopped (08/30/22 0606)   micafungin (MYCAMINE) 100 mg in sodium chloride 0.9 % 100 mL IVPB 100 mg (08/30/22 0825)     LOS: 12 days   Burnadette Pop, MD [facility] P7/14/2024, 9:49 AM

## 2022-08-30 NOTE — Progress Notes (Signed)
ANTICOAGULATION CONSULT NOTE  Pharmacy Consult for heparin  Indication: pulmonary embolus with recent stroke on MRI on 7/10  Allergies  Allergen Reactions   Aspirin Other (See Comments)    Caused an asthma attack   Ibuprofen Swelling and Other (See Comments)    Angioedema and Asthma exacerbation   Sulfonamide Derivatives Swelling and Other (See Comments)    Face swelled SEVERELY   Penicillins Rash    Patient Measurements: Height: 5' 5.98" (167.6 cm) Weight: 80 kg (176 lb 5.9 oz) IBW/kg (Calculated) : 59.26 Heparin Dosing Weight: 75kg  Vital Signs: Temp: 98.2 F (36.8 C) (07/14 2000) Temp Source: Oral (07/14 2000) BP: 148/66 (07/14 2000) Pulse Rate: 115 (07/14 2009)  Labs: Recent Labs    08/28/22 0753 08/28/22 1819 08/29/22 0436 08/29/22 1337 08/30/22 0123 08/30/22 1059 08/30/22 1935  HGB 11.9*  --  9.9*  --  9.2*  --   --   HCT 38.8  --  32.4*  --  31.1*  --   --   PLT 245  --  320  --  467*  --   --   HEPARINUNFRC <0.10*   < > 0.20*   < > 0.23* 0.46 0.37  CREATININE 0.94  --   --   --  0.79  --   --    < > = values in this interval not displayed.    Estimated Creatinine Clearance: 69.8 mL/min (by C-G formula based on SCr of 0.79 mg/dL).  Assessment: 72 y.o. female with PE for heparin. Of note: patient with New stroke - Seen on MRI 7/10, suspected thromboembolic. F/u CT A/P.  POD 8 s/p ex lap, right hemicolectomy, ileostomy, mucus fistula, biopsy of pelvic mass by Dr. 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  -Evening update: HL remains therapeutic at 0.37. No issues with infusion or bleeding reported.  Goal of Therapy:  Heparin level 0.3-0.5 units/ml Monitor platelets by anticoagulation protocol: Yes  Plan:  Continue heparin at 2000 units/hr Check anti-Xa level daily while on heparin Continue to monitor H&H and platelets   Thank you for allowing pharmacy to be a part of this patient's care.   Signe Colt,  PharmD 08/30/2022 8:19 PM  **Pharmacist phone directory can be found on [email or web address] listed under [facility]**

## 2022-08-30 NOTE — Progress Notes (Signed)
Daily Progress Note   Patient Name: Debra Sharp       Date: 08/30/2022 DOB: 1950-05-10  Age: 72 y.o. MRN#: [id number] Attending Physician: Burnadette Pop, MD Primary Care Physician: Clayborn Heron, MD Admit Date: 08/18/2022  Reason for Consultation/Follow-up: Establishing goals of care  Subjective: Medical records reviewed including progress notes, labs, imaging. Patient assessed at the bedside.  She denies pain or distress.  No family present.  Created space and opportunity for patient's thoughts and feelings on her current illness and yesterday's GOC discussion. She states there is nothing more than she wishes to review or discuss. Advised that her son Robert's FMLA paperwork has not gone through via fax yet due to busy line and that PMT would continue to attempt successful transmission. She will let him know.  Questions and concerns addressed. PMT will continue to support holistically.   Length of Stay: 12   Physical Exam Vitals and nursing note reviewed.  Constitutional:      General: She is not in acute distress.    Appearance: She is ill-appearing.     Comments: Cortrak in place  Cardiovascular:     Rate and Rhythm: Normal rate.  Pulmonary:     Effort: Pulmonary effort is normal. No respiratory distress.  Neurological:     Mental Status: She is alert.  Psychiatric:        Mood and Affect: Mood normal.        Behavior: Behavior normal.             Vital Signs: BP (!) 155/73 (BP Location: Left Arm)   Pulse 96   Temp 97.6 F (36.4 C) (Oral)   Resp 18   Ht 5' 5.98" (1.676 m)   Wt 80 kg   SpO2 100%   BMI 28.48 kg/m  SpO2: SpO2: 100 % O2 Device: O2 Device: Room Air O2 Flow Rate: O2 Flow Rate (L/min): 3 L/min      Palliative Assessment/Data:  20-30%    Palliative Care Assessment & Plan   Patient Profile: 72 y.o. female  with past medical history of iron deficiency anemia, HTN, HLD, thrombocytopenia, COPD, CHF presented to ED on 08/18/22 with complaints of generalized weakness, nausea/vomiting, and abdominal pain. Patient was admitted on 08/18/2022 with acute on chronic IDA/blood loss anemia, incidental finding of age indeterminate infarct, thrombocythemia, hypokalemia.  Neurology was consulted in the setting of incidental findings of acute stroke on MRI - it was felt strokes were cardiac source and TEE was recommended, which patient refused. Urology was consulted for management of right hydronephrosis - felt it is due to obstruction from pelvic mass; after mass was surgically removed, state kidney can not be obstructed for another 6 months without losing it. GI was consulted for anemia and abdominal imaging - plan was for colonoscopy and endoscopy, however patient did not tolerate the prep and upon further imaging CT showed free air with concern for perforation and Surgery was consulted - patient underwent emergent exlap, creation of mucus fistula, right hemicolectomy, colostomy, and biopsy of pelvic mass on 7/6. She was subsequently intubated, on pressors, and admitted to ICU after surgery. She was extubated on 7/8 but remained encephalopathic, which has been gradually improving. Her hospitalization has been complicated by septic shock secondary to peritonitis as well as acute PE. 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 7/11 patient was stable enough for transfer out of the ICU.   Palliative Medicine was consulted for GOC in context of malignancy and incomplete resection.  Assessment: Goals of care conversation Acute PE Multiple acute embolic strokes Acute hypoxic respiratory failure status post extubation, improved Septic shock secondary to acute peritonitis due to chronic perforation/obstructing pelvic mass AKI,  resolved  Recommendations/Plan: Continue full code/full scope treatment Psychosocial and emotional support provided Re-attempt to fax FMLA paperwork for patient's son today Ongoing goals of care discussions pending clinical course PMT will see again 7/18. Patient has been encouraged to reach out with additional needs or concerns   Prognosis: Poor long-term prognosis  Discharge Planning: To Be Determined  Care plan was discussed with patient, RN   Total time: I spent 35 minutes in the care of the patient today in the above activities and documenting the encounter.   Aliene Tamura Jeni Salles, PA-C  Palliative Medicine Team Team phone # [phone]  Thank you for allowing the Palliative Medicine Team to assist in the care of this patient. Please utilize secure chat with additional questions, if there is no response within 30 minutes please call the above phone number.  Palliative Medicine Team providers are available by phone from 7am to 7pm daily and can be reached through the team cell phone.  Should this patient require assistance outside of these hours, please call the patient's attending physician.

## 2022-08-30 NOTE — Progress Notes (Signed)
ANTICOAGULATION CONSULT NOTE  Pharmacy Consult for heparin  Indication: pulmonary embolus  Allergies  Allergen Reactions   Aspirin Other (See Comments)    Caused an asthma attack   Ibuprofen Swelling and Other (See Comments)    Angioedema and Asthma exacerbation   Sulfonamide Derivatives Swelling and Other (See Comments)    Face swelled SEVERELY   Penicillins Rash    Patient Measurements: Height: 5' 5.98" (167.6 cm) Weight: 79.8 kg (175 lb 14.8 oz) IBW/kg (Calculated) : 59.26 Heparin Dosing Weight: 75kg  Vital Signs: Temp: 97.8 F (36.6 C) (07/13 1959) Temp Source: Oral (07/13 1959) BP: 142/61 (07/14 0000) Pulse Rate: 94 (07/14 0200)  Labs: Recent Labs    08/27/22 0741 08/28/22 0753 08/28/22 1819 08/29/22 0436 08/29/22 1337 08/29/22 2345 08/30/22 0123  HGB 10.7* 11.9*  --  9.9*  --   --  9.2*  HCT 33.7* 38.8  --  32.4*  --   --  31.1*  PLT 229 245  --  320  --   --  467*  HEPARINUNFRC  --  <0.10*   < > 0.20* 0.26* 0.31 0.23*  CREATININE 0.99 0.94  --   --   --   --  0.79   < > = values in this interval not displayed.    Estimated Creatinine Clearance: 69.7 mL/min (by C-G formula based on SCr of 0.79 mg/dL).  Assessment: 72 y.o. female with PE for heparin.  HL 0.26, subtherapeutic. Hgb 9.9, plts 320. No issues with infusion or s/sx of bleeding per RN.  7/14 AM update #2:  Heparin level sub-therapeutic   Goal of Therapy:  Heparin level 0.3-0.5 units/ml Monitor platelets by anticoagulation protocol: Yes  Plan:  Inc heparin to 2000 units/hr 1100 heparin level  Abran Duke, PharmD, BCPS Clinical Pharmacist Phone: [phone]

## 2022-08-31 ENCOUNTER — Encounter (HOSPITAL_COMMUNITY): Payer: Self-pay | Admitting: Internal Medicine

## 2022-08-31 ENCOUNTER — Inpatient Hospital Stay (HOSPITAL_COMMUNITY): Payer: Medicare Other

## 2022-08-31 ENCOUNTER — Encounter (HOSPITAL_COMMUNITY): Payer: Medicare Other

## 2022-08-31 DIAGNOSIS — K922 Gastrointestinal hemorrhage, unspecified: Secondary | ICD-10-CM | POA: Diagnosis not present

## 2022-08-31 HISTORY — PX: IR NEPHROSTOMY PLACEMENT RIGHT: IMG6064

## 2022-08-31 LAB — CBC
HCT: 31.3 % — ABNORMAL LOW (ref 36.0–46.0)
Hemoglobin: 9.4 g/dL — ABNORMAL LOW (ref 12.0–15.0)
MCH: 24.9 pg — ABNORMAL LOW (ref 26.0–34.0)
MCHC: 30 g/dL (ref 30.0–36.0)
MCV: 83 fL (ref 80.0–100.0)
Platelets: 655 10*3/uL — ABNORMAL HIGH (ref 150–400)
RBC: 3.77 MIL/uL — ABNORMAL LOW (ref 3.87–5.11)
RDW: 27.6 % — ABNORMAL HIGH (ref 11.5–15.5)
WBC: 20.3 10*3/uL — ABNORMAL HIGH (ref 4.0–10.5)
nRBC: 0 % (ref 0.0–0.2)

## 2022-08-31 LAB — COMPREHENSIVE METABOLIC PANEL
ALT: 12 U/L (ref 0–44)
AST: 17 U/L (ref 15–41)
Albumin: 1.5 g/dL — ABNORMAL LOW (ref 3.5–5.0)
Alkaline Phosphatase: 91 U/L (ref 38–126)
Anion gap: 8 (ref 5–15)
BUN: 24 mg/dL — ABNORMAL HIGH (ref 8–23)
CO2: 17 mmol/L — ABNORMAL LOW (ref 22–32)
Calcium: 7.5 mg/dL — ABNORMAL LOW (ref 8.9–10.3)
Chloride: 108 mmol/L (ref 98–111)
Creatinine, Ser: 0.71 mg/dL (ref 0.44–1.00)
GFR, Estimated: >60 mL/min (ref >60)
Glucose, Bld: 130 mg/dL — ABNORMAL HIGH (ref 70–99)
Potassium: 4.1 mmol/L (ref 3.5–5.1)
Sodium: 133 mmol/L — ABNORMAL LOW (ref 135–145)
Total Bilirubin: 0.5 mg/dL (ref 0.3–1.2)
Total Protein: 4.7 g/dL — ABNORMAL LOW (ref 6.5–8.1)

## 2022-08-31 LAB — PHOSPHORUS: Phosphorus: 2.8 mg/dL (ref 2.5–4.6)

## 2022-08-31 LAB — GLUCOSE, CAPILLARY
Glucose-Capillary: 100 mg/dL — ABNORMAL HIGH (ref 70–99)
Glucose-Capillary: 102 mg/dL — ABNORMAL HIGH (ref 70–99)
Glucose-Capillary: 118 mg/dL — ABNORMAL HIGH (ref 70–99)
Glucose-Capillary: 118 mg/dL — ABNORMAL HIGH (ref 70–99)
Glucose-Capillary: 129 mg/dL — ABNORMAL HIGH (ref 70–99)
Glucose-Capillary: 80 mg/dL (ref 70–99)

## 2022-08-31 LAB — HEPARIN LEVEL (UNFRACTIONATED): Heparin Unfractionated: 0.29 IU/mL — ABNORMAL LOW (ref 0.30–0.70)

## 2022-08-31 MED ORDER — SODIUM CHLORIDE 0.9 % IV SOLN: 12.5000 mg | Freq: Four times a day (QID) | INTRAVENOUS | Status: DC | PRN

## 2022-08-31 MED ORDER — K PHOS MONO-SOD PHOS DI & MONO 155-852-130 MG PO TABS
500.0000 mg | ORAL_TABLET | Freq: Three times a day (TID) | ORAL | Status: DC
  Administered 2022-08-31 – 2022-09-03 (×10): 500 mg
  Filled 2022-08-31 (×12): qty 2

## 2022-08-31 MED ORDER — FERROUS SULFATE 325 (65 FE) MG PO TABS
325.0000 mg | ORAL_TABLET | Freq: Two times a day (BID) | ORAL | Status: DC
  Administered 2022-08-31 – 2022-09-10 (×17): 325 mg via ORAL
  Filled 2022-08-31 (×21): qty 1

## 2022-08-31 MED ORDER — LIDOCAINE HCL 1 % IJ SOLN
20.0000 mL | Freq: Once | INTRAMUSCULAR | Status: AC
  Administered 2022-08-31: 5 mL via INTRADERMAL
  Filled 2022-08-31: qty 20

## 2022-08-31 MED ORDER — FENTANYL CITRATE (PF) 100 MCG/2ML IJ SOLN
INTRAMUSCULAR | Status: AC
  Filled 2022-08-31: qty 2

## 2022-08-31 MED ORDER — FENTANYL CITRATE (PF) 100 MCG/2ML IJ SOLN
INTRAMUSCULAR | Status: AC | PRN
  Administered 2022-08-31: 25 ug via INTRAVENOUS

## 2022-08-31 MED ORDER — MIDAZOLAM HCL 2 MG/2ML IJ SOLN
INTRAMUSCULAR | Status: AC
  Filled 2022-08-31: qty 2

## 2022-08-31 MED ORDER — IOHEXOL 300 MG/ML  SOLN
50.0000 mL | Freq: Once | INTRAMUSCULAR | Status: AC | PRN
  Administered 2022-08-31: 7 mL

## 2022-08-31 MED ORDER — LIDOCAINE HCL 1 % IJ SOLN
INTRAMUSCULAR | Status: AC
  Filled 2022-08-31: qty 20

## 2022-08-31 MED ORDER — MIDAZOLAM HCL 2 MG/2ML IJ SOLN
INTRAMUSCULAR | Status: AC | PRN
  Administered 2022-08-31: 1 mg via INTRAVENOUS

## 2022-08-31 MED ORDER — LOPERAMIDE HCL 2 MG PO CAPS
2.0000 mg | ORAL_CAPSULE | Freq: Two times a day (BID) | ORAL | Status: DC
  Administered 2022-08-31 – 2022-09-06 (×13): 2 mg via ORAL
  Filled 2022-08-31 (×14): qty 1

## 2022-08-31 MED ORDER — CALCIUM POLYCARBOPHIL 625 MG PO TABS
625.0000 mg | ORAL_TABLET | Freq: Two times a day (BID) | ORAL | Status: DC
  Administered 2022-08-31 – 2022-09-07 (×12): 625 mg
  Filled 2022-08-31 (×14): qty 1

## 2022-08-31 NOTE — Progress Notes (Signed)
SLP Cancellation Note  Patient Details Name: Delorese Sellin MRN: [id number] DOB: [birth year]   Cancelled treatment:       Reason Eval/Treat Not Completed: Medical issues which prohibited therapy. Pt NPO per surgical team. SLP will f/u as medically appropriate to do so.    Mahala Menghini., M.A. CCC-SLP Acute Rehabilitation Services Office [phone]  Secure chat preferred  08/31/2022, 9:05 AM

## 2022-08-31 NOTE — Progress Notes (Signed)
TRH night cross cover note:   I was notified by RN that the patient continues to experience nausea/vomiting, noting that the patient is thrown up somewhere between 800 cc to 1000 cc so far this evening of green appearing vomitus.  RN conveys that the patient also vomited a few times during dayshift.  Patient conveys that she has not been feeling nauseous, but notes that these episodes of vomiting are for nearly without warning over the last day.  She is currently on continuous tube feeds, which will be held for now.  Vital signs appear reassuring, she remains afebrile, with systolic blood pressures in the 140s.  Heart rates in the low 100s, consistent with heart rates throughout the day.  Does not appear to be in any respiratory distress.  Respiratory rate noted to be 18, with O2 sats in the high 90s on room air.  I subsequently added prn IV Phenergan for nausea/vomiting refractory to existing order for prn IV Zofran.  Additionally, RN conveys to me that the patient is refusing her CT of the head this evening.   Newton Pigg, DO Hospitalist

## 2022-08-31 NOTE — Progress Notes (Signed)
 Abdominal wet to dry dressing changed per MD order without difficulty.

## 2022-08-31 NOTE — Progress Notes (Signed)
9 Days Post-Op Subjective: 7/15: NAEON. Pt alert and oriented. Much improved over last visit.  Objective: Vital signs in last 24 hours: Temp:  [97.7 F (36.5 C)-98.4 F (36.9 C)] 98 F (36.7 C) (07/15 0800) Pulse Rate:  [99-123] 105 (07/15 0800) Resp:  [16-20] 18 (07/15 0800) BP: (126-155)/(59-75) 126/65 (07/15 0800) SpO2:  [98 %-100 %] 98 % (07/15 0800) Weight:  [81.7 kg] 81.7 kg (07/15 0443)  Intake/Output from previous day: 07/14 0701 - 07/15 0700 In: 1297.4 [I.V.:393.2; NG/GT:804.2; IV Piggyback:100] Out: 3865 [Urine:2055; Drains:135; Stool:1675]  Intake/Output this shift: No intake/output data recorded.  Physical Exam:  General: Alert and oriented CV: No cyanosis Lungs: equal chest rise Abdomen: Soft, NTND, no rebound or guarding Gu: Pure wick catheter  Lab Results: Recent Labs    08/29/22 0436 08/30/22 0123 08/31/22 0150  HGB 9.9* 9.2* 9.4*  HCT 32.4* 31.1* 31.3*   BMET Recent Labs    08/30/22 0123 08/31/22 0150  NA 137 133*  K 4.0 4.1  CL 106 108  CO2 21* 17*  GLUCOSE 127* 130*  BUN 30* 24*  CREATININE 0.79 0.71  CALCIUM 7.5* 7.5*     Studies/Results: CT ABDOMEN PELVIS W CONTRAST  Result Date: 08/30/2022 CLINICAL DATA:  Abdominal pain. Right hemicolectomy and ileostomy 8 days ago. Now with feculent drainage EXAM: CT ABDOMEN AND PELVIS WITH CONTRAST TECHNIQUE: Multidetector CT imaging of the abdomen and pelvis was performed using the standard protocol following bolus administration of intravenous contrast. RADIATION DOSE REDUCTION: This exam was performed according to the departmental dose-optimization program which includes automated exposure control, adjustment of the mA and/or kV according to patient size and/or use of iterative reconstruction technique. CONTRAST:  75mL OMNIPAQUE IOHEXOL 350 MG/ML SOLN COMPARISON:  08/22/2022, 08/19/2022 FINDINGS: Lower chest: Small bilateral pleural effusions, left greater than right. Effusions have increased  in size from prior. Associated compressive atelectasis. Hepatobiliary: Liver is enlarged measuring approximately 21 cm in length. No focal liver lesion identified. Gallbladder within normal limits. Pancreas: Unremarkable. No pancreatic ductal dilatation or surrounding inflammatory changes. Spleen: Enlarged spleen with new, near-complete hypoattenuation of the splenic parenchyma. There also appears to be subcapsular low-density fluid along the peripheral margin of the spleen. Adrenals/Urinary Tract: Unremarkable adrenal glands. Severe right hydroureteronephrosis. Layering high attenuation material within the right renal collecting system is likely related to contrast excretion. New mild left hydronephrosis. Urinary bladder within normal limits. Stomach/Bowel: Enteric tube extends into the stomach with distal tip terminating at the level of the duodenal bulb. Interval partial right hemicolectomy with right lower quadrant ostomy. Multiple mildly dilated small bowel loops measuring up to 4.4 cm in diameter. Numerous air-fluid levels. Enteric contrast has partially traversed small bowel. Vascular/Lymphatic: Aortic atherosclerosis. No definite lymphadenopathy. Reproductive: Large calcified uterine fibroid. Ovaries are not definitively seen. Other: Midline surgical incision site. Surgical drain is present within the pelvis with distal tip terminating in the lower right hemiabdomen. Small volume fluid within the abdomen and pelvis with several collections demonstrating partial rim enhancement. For example, collection within the lower right pericolic gutter measures approximately 5.2 x 3.5 x 4.1 cm (series 3, image 58). Collection along the anterior margin of the uterine fundus measures approximately 7.2 x 1.8 x 5.1 cm (series 3, image 69). Small amount of air is noted along the course of the surgical drain. No significant volume of pneumoperitoneum. Large complex mass occupying the majority of the low right pelvis is  grossly unchanged. Musculoskeletal: No acute osseous abnormality. Increased body wall edema. IMPRESSION: 1. Interval  postsurgical changes to the abdomen and pelvis including partial right hemicolectomy with right lower quadrant ostomy. 2. Small volume fluid within the abdomen and pelvis with several collections demonstrating rim-enhancement suggestive of abscesses. 3. New, near-complete hypoattenuation of the splenic parenchyma with subcapsular low-density fluid along the peripheral margin of the spleen. Findings are concerning for global splenic infarct. 4. Multiple mildly dilated small bowel loops with numerous air-fluid levels. Findings are favored to represent postoperative ileus. 5. Persistent severe right hydroureteronephrosis. New mild left hydronephrosis. 6. Small bilateral pleural effusions, left greater than right. Effusions have increased in size from prior. 7. Large complex mass occupying the majority of the low right pelvis is grossly unchanged. 8. Hepatomegaly. 9. Aortic atherosclerosis (ICD10-I70.0). These results will be called to the ordering clinician or representative by the Radiologist Assistant, and communication documented in the PACS or [employer]. Electronically Signed   By: Duanne Guess D.O.   On: 08/30/2022 15:05    Assessment/Plan: # Severe right hydronephrosis 2/2 abdominal mass  Preserved renal function. Trend labs Unfortunately it appears patient had bowel perforation shortly after we saw her and required hemicolectomy.  It was unclear if this would have improvement in ureteral obstruction.  Repeat renal ultrasound was collected today-7/11.  Right side hydronephrosis was unchanged.  Now that she has undergone surgery it is unlikely that any more of this pelvic mass will be removed with her hostile abdomen.  She will not be able to have this kidney obstructed for another 6 months or so without losing it.  Discussed with pt son Molly Maduro and pt. She was much more alert  today. Oriented to person, place, time, knew the president.  Consulted IR for R. PCN'T.    LOS: 13 days   Elmon Kirschner, NP [facility] Pager: [phone]  08/31/2022, 10:26 AM

## 2022-08-31 NOTE — Progress Notes (Addendum)
9 Days Post-Op  Subjective: CC: No abdominal pain. Tolerating TF's. Some reflux this am but no n/v reported. Having ostomy output - 1675cc/24 hours. Good uop.   More confused today. Thinks she is at home. Primary has ordered CTH  Afebrile. Tachycardic this am. No hypotension. WBC down at 20.3 from 24.5.   Objective: Vital signs in last 24 hours: Temp:  [97.7 F (36.5 C)-98.4 F (36.9 C)] 98 F (36.7 C) (07/15 0800) Pulse Rate:  [99-123] 105 (07/15 0800) Resp:  [16-20] 18 (07/15 0800) BP: (126-155)/(59-75) 126/65 (07/15 0800) SpO2:  [98 %-100 %] 98 % (07/15 0800) Weight:  [81.7 kg] 81.7 kg (07/15 0443) Last BM Date : 08/31/22  Intake/Output from previous day: 07/14 0701 - 07/15 0700 In: 1297.4 [I.V.:393.2; NG/GT:804.2; IV Piggyback:100] Out: 1761 [Urine:2055; Drains:135; Stool:1675] Intake/Output this shift: No intake/output data recorded.  PE: Gen:  Alert, NAD, pleasant Abd: Soft, mild distension, some left sided ttp. Stoma and mucus fistula viable - ostomy appliance with thin dark brown output in bag.  Midline wound overall clean with some fibrinous tissue at the base with granulation tissue otherwise. JP drain with dark brown thin fluid.   Lab Results:  Recent Labs    08/30/22 0123 08/31/22 0150  WBC 24.5* 20.3*  HGB 9.2* 9.4*  HCT 31.1* 31.3*  PLT 467* 655*   BMET Recent Labs    08/30/22 0123 08/31/22 0150  NA 137 133*  K 4.0 4.1  CL 106 108  CO2 21* 17*  GLUCOSE 127* 130*  BUN 30* 24*  CREATININE 0.79 0.71  CALCIUM 7.5* 7.5*   PT/INR No results for input(s): "LABPROT", "INR" in the last 72 hours. CMP     Component Value Date/Time   NA 133 (L) 08/31/2022 0150   K 4.1 08/31/2022 0150   CL 108 08/31/2022 0150   CO2 17 (L) 08/31/2022 0150   GLUCOSE 130 (H) 08/31/2022 0150   BUN 24 (H) 08/31/2022 0150   CREATININE 0.71 08/31/2022 0150   CALCIUM 7.5 (L) 08/31/2022 0150   PROT 4.7 (L) 08/31/2022 0150   ALBUMIN 1.5 (L) 08/31/2022 0150   AST 17  08/31/2022 0150   ALT 12 08/31/2022 0150   ALKPHOS 91 08/31/2022 0150   BILITOT 0.5 08/31/2022 0150   GFRNONAA >60 08/31/2022 0150   GFRAA >60 05/11/2015 0231   Lipase  No results found for: "LIPASE"  Studies/Results: CT ABDOMEN PELVIS W CONTRAST  Result Date: 08/30/2022 CLINICAL DATA:  Abdominal pain. Right hemicolectomy and ileostomy 8 days ago. Now with feculent drainage EXAM: CT ABDOMEN AND PELVIS WITH CONTRAST TECHNIQUE: Multidetector CT imaging of the abdomen and pelvis was performed using the standard protocol following bolus administration of intravenous contrast. RADIATION DOSE REDUCTION: This exam was performed according to the departmental dose-optimization program which includes automated exposure control, adjustment of the mA and/or kV according to patient size and/or use of iterative reconstruction technique. CONTRAST:  75mL OMNIPAQUE IOHEXOL 350 MG/ML SOLN COMPARISON:  08/22/2022, 08/19/2022 FINDINGS: Lower chest: Small bilateral pleural effusions, left greater than right. Effusions have increased in size from prior. Associated compressive atelectasis. Hepatobiliary: Liver is enlarged measuring approximately 21 cm in length. No focal liver lesion identified. Gallbladder within normal limits. Pancreas: Unremarkable. No pancreatic ductal dilatation or surrounding inflammatory changes. Spleen: Enlarged spleen with new, near-complete hypoattenuation of the splenic parenchyma. There also appears to be subcapsular low-density fluid along the peripheral margin of the spleen. Adrenals/Urinary Tract: Unremarkable adrenal glands. Severe right hydroureteronephrosis. Layering high attenuation material within  the right renal collecting system is likely related to contrast excretion. New mild left hydronephrosis. Urinary bladder within normal limits. Stomach/Bowel: Enteric tube extends into the stomach with distal tip terminating at the level of the duodenal bulb. Interval partial right hemicolectomy  with right lower quadrant ostomy. Multiple mildly dilated small bowel loops measuring up to 4.4 cm in diameter. Numerous air-fluid levels. Enteric contrast has partially traversed small bowel. Vascular/Lymphatic: Aortic atherosclerosis. No definite lymphadenopathy. Reproductive: Large calcified uterine fibroid. Ovaries are not definitively seen. Other: Midline surgical incision site. Surgical drain is present within the pelvis with distal tip terminating in the lower right hemiabdomen. Small volume fluid within the abdomen and pelvis with several collections demonstrating partial rim enhancement. For example, collection within the lower right pericolic gutter measures approximately 5.2 x 3.5 x 4.1 cm (series 3, image 58). Collection along the anterior margin of the uterine fundus measures approximately 7.2 x 1.8 x 5.1 cm (series 3, image 69). Small amount of air is noted along the course of the surgical drain. No significant volume of pneumoperitoneum. Large complex mass occupying the majority of the low right pelvis is grossly unchanged. Musculoskeletal: No acute osseous abnormality. Increased body wall edema. IMPRESSION: 1. Interval postsurgical changes to the abdomen and pelvis including partial right hemicolectomy with right lower quadrant ostomy. 2. Small volume fluid within the abdomen and pelvis with several collections demonstrating rim-enhancement suggestive of abscesses. 3. New, near-complete hypoattenuation of the splenic parenchyma with subcapsular low-density fluid along the peripheral margin of the spleen. Findings are concerning for global splenic infarct. 4. Multiple mildly dilated small bowel loops with numerous air-fluid levels. Findings are favored to represent postoperative ileus. 5. Persistent severe right hydroureteronephrosis. New mild left hydronephrosis. 6. Small bilateral pleural effusions, left greater than right. Effusions have increased in size from prior. 7. Large complex mass occupying  the majority of the low right pelvis is grossly unchanged. 8. Hepatomegaly. 9. Aortic atherosclerosis (ICD10-I70.0). These results will be called to the ordering clinician or representative by the Radiologist Assistant, and communication documented in the PACS or [employer]. Electronically Signed   By: Duanne Guess D.O.   On: 08/30/2022 15:05    Anti-infectives: Anti-infectives (From admission, onward)    Start     Dose/Rate Route Frequency Ordered Stop   08/30/22 0830  ceFEPIme (MAXIPIME) 2 g in sodium chloride 0.9 % 100 mL IVPB        2 g 200 mL/hr over 30 Minutes Intravenous Every 8 hours 08/30/22 0826     08/23/22 2100  ceFEPIme (MAXIPIME) 2 g in sodium chloride 0.9 % 100 mL IVPB  Status:  Discontinued        2 g 200 mL/hr over 30 Minutes Intravenous Every 12 hours 08/23/22 1357 08/30/22 0826   08/23/22 0400  micafungin (MYCAMINE) 100 mg in sodium chloride 0.9 % 100 mL IVPB        100 mg 105 mL/hr over 1 Hours Intravenous Daily 08/23/22 0242     08/22/22 1800  metroNIDAZOLE (FLAGYL) IVPB 500 mg        500 mg 100 mL/hr over 60 Minutes Intravenous Every 12 hours 08/22/22 1711     08/22/22 1800  ceFEPIme (MAXIPIME) 2 g in sodium chloride 0.9 % 100 mL IVPB  Status:  Discontinued        2 g 200 mL/hr over 30 Minutes Intravenous Every 8 hours 08/22/22 1716 08/23/22 1357   08/22/22 1724  metroNIDAZOLE (FLAGYL) 500 MG/100ML IVPB  Note to Pharmacy: [name]: cabinet override      08/22/22 1724 08/23/22 0845   08/19/22 1030  cefTRIAXone (ROCEPHIN) 1 g in sodium chloride 0.9 % 100 mL IVPB  Status:  Discontinued        1 g 200 mL/hr over 30 Minutes Intravenous Every 24 hours 08/19/22 0937 08/22/22 1715        Assessment/Plan POD 9 s/p exploratory laparotomy, right hemicolectomy, ileostomy, mucus fistula, biopsy of pelvic mass by Dr. 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 Margins negative. 0/43 lymph nodes.  - CEA - 143, CA 125: 40 and CA 19-9: 50 - Has had CT CAP since admission - Recommend oncology consult to make sure she has f/u - High ileostomy output. Cont fiber. Add iron and immodium. Monitor output, Cr, electrolytes. Avoid sugary drinks.  - WOC for stoma teaching and assistance with pouching. - BID wet to dry to midline - Cont JP drain - CT 7/14 obtained for drain appearing feculent - this showed small volume fluid within the abdomen and pelvis with several collections demonstrating rim-enhancement suggestive of abscesses -  lower right pericolic gutter fluid collection measures 5.2 x 3.5 x 4.1 cm and collection along the anterior margin of the uterine fundus measures approximately 7.2 x 1.8 x 5.1 cm. Surgical drain is near both of these collections but does not necessarily course through them completely. Will consult IR for drainage and discuss with MD. Cont abx  Appreciate palliative and [facility] assistance.    FEN: NPO and hold TF's for IR consult. IVF per TRH VTE: SCDs, Heparin gtt ID: Cefepime/flagyl/micafungin Foley: Removed, Monitor.  Dispo - CT  Per [facility] New stroke - Seen on MRI 7/10, suspected thromboembolic, defer to primary team/stroke team for further w/u and management. Primary getting CTH today for increased confusion.  PE - heparin drip per primary. hours till we determine if needs CT A/P.  HTN HLD GERD COPD R hydro - Neprho following and planning R PCN CT 7/14 also suggestion of splenic infarct and bilateral pleural effusions    LOS: 13 days    Jacinto Halim , [facility] Surgery 08/31/2022, 9:46 AM Please see Amion for pager number during day hours 7:00am-4:30pm

## 2022-08-31 NOTE — Consult Note (Signed)
WOC Nurse ostomy follow-up consult note;  Exp lap w/R hemicolectomy; ileostomy and mucus fistula performed 08/22/2022  Stoma type/location:  RMQ ileostomy, RLQ mucus fistula  Stomal assessment/size:  1 3/4" ileostomy, red moist, slightly oval; mucus fistula 1 3/8" round, red moist; both above skin level but located in close proximity with narrow intact bridge of skin in between, making it difficult to apply 2 separate pouches.    Peristomal assessment: intact peristomal skin; patient with a few dark colored skin tags near superior portion of stoma, bleed slightly when cleansed. Output: Mod amt dark brown liquid stool in bedside drainage bag. Ostomy pouching: Medium Kathrine Cords  [facility] [place] [phone]) pouch placed to cover both ileostomy and fistula; pattern left in room. Applied piece of barrier in between stomas over exposed skin. Instructions provided for bedside nurses to perform pouch change PRN if leaking. Pt did not watch the procedure or ask questions and does not appear aware of ostomy pouch. No family present.  Pt will need total assistance with pouch application and emptying after discharge. 3 sets of supplies left at the bedside for staff nurse's use, as well as educational materials.  Enrolled patient in [place] Secure Start DC program: NOT YET.  WOC team will assess twice weekly. Thank-you,  Cammie Mcgee MSN, RN, CWOCN, [place], CNS [phone]

## 2022-08-31 NOTE — Progress Notes (Signed)
ANTICOAGULATION CONSULT NOTE  Pharmacy Consult for heparin  Indication: pulmonary embolus with recent stroke on MRI on 7/10  Allergies  Allergen Reactions   Aspirin Other (See Comments)    Caused an asthma attack   Ibuprofen Swelling and Other (See Comments)    Angioedema and Asthma exacerbation   Sulfonamide Derivatives Swelling and Other (See Comments)    Face swelled SEVERELY   Penicillins Rash    Patient Measurements: Height: 5' 5.98" (167.6 cm) Weight: 81.7 kg (180 lb 1.9 oz) IBW/kg (Calculated) : 59.26 Heparin Dosing Weight: 75kg  Vital Signs: Temp: 98 F (36.7 C) (07/15 0800) Temp Source: Oral (07/15 0800) BP: 126/65 (07/15 0800) Pulse Rate: 105 (07/15 0800)  Labs: Recent Labs    08/29/22 0436 08/29/22 1337 08/30/22 0123 08/30/22 1059 08/30/22 1935 08/31/22 0150  HGB 9.9*  --  9.2*  --   --  9.4*  HCT 32.4*  --  31.1*  --   --  31.3*  PLT 320  --  467*  --   --  655*  HEPARINUNFRC 0.20*   < > 0.23* 0.46 0.37 0.29*  CREATININE  --   --  0.79  --   --  0.71   < > = values in this interval not displayed.    Estimated Creatinine Clearance: 70.6 mL/min (by C-G formula based on SCr of 0.71 mg/dL).  Assessment: 72 y.o. female with PE for heparin. Of note: patient with New stroke - Seen on MRI 7/10, suspected thromboembolic.  Heparin level 0.29 units/mL on heparin 2000 units/hr (previously therapeutic). No signs of bleeding reported.  Goal of Therapy:  Heparin level 0.3-0.5 units/ml Monitor platelets by anticoagulation protocol: Yes  Plan:  Increase heparin slightly to 2050 units/hr Check heparin level and CBC daily F/u long-term AC plan  Eldridge Scot, PharmD Clinical Pharmacist 08/31/2022, 8:55 AM

## 2022-08-31 NOTE — Progress Notes (Signed)
ANTICOAGULATION CONSULT NOTE   Pharmacy Consult for heparin  Indication: pulmonary embolus with recent stroke on MRI on 7/10  Allergies  Allergen Reactions   Aspirin Other (See Comments)    Caused an asthma attack   Ibuprofen Swelling and Other (See Comments)    Angioedema and Asthma exacerbation   Sulfonamide Derivatives Swelling and Other (See Comments)    Face swelled SEVERELY   Penicillins Rash    Patient Measurements: Height: 5' 5.98" (167.6 cm) Weight: 81.7 kg (180 lb 1.9 oz) IBW/kg (Calculated) : 59.26 Heparin Dosing Weight: 75kg  Vital Signs: Temp: 98.2 F (36.8 C) (07/15 1700) Temp Source: Axillary (07/15 1700) BP: 143/60 (07/15 1700) Pulse Rate: 103 (07/15 1700)  Labs: Recent Labs    08/29/22 0436 08/29/22 1337 08/30/22 0123 08/30/22 1059 08/30/22 1935 08/31/22 0150  HGB 9.9*  --  9.2*  --   --  9.4*  HCT 32.4*  --  31.1*  --   --  31.3*  PLT 320  --  467*  --   --  655*  HEPARINUNFRC 0.20*   < > 0.23* 0.46 0.37 0.29*  CREATININE  --   --  0.79  --   --  0.71   < > = values in this interval not displayed.    Estimated Creatinine Clearance: 70.6 mL/min (by C-G formula based on SCr of 0.71 mg/dL).  Assessment: 72 y.o. female with PE for heparin. Of note: patient with New stroke - Seen on MRI 7/10, suspected thromboembolic.  Heparin level 0.29 units/mL on heparin 2000 units/hr (previously therapeutic). No signs of bleeding reported. Increased to 2050 units/hr. Heparin stopped prior to IR procedure, now resumed per RN with confirmation with IR team.   Goal of Therapy:  Heparin level 0.3-0.5 units/ml Monitor platelets by anticoagulation protocol: Yes  Plan:  Heparin infusion at 2050 units/hr @ 1700 Check heparin level in 8 hours and daily while on heparin Continue to monitor H&H and platelets  Thank you for allowing pharmacy to be a part of this patient's care.  Thelma Barge, PharmD Clinical Pharmacist

## 2022-08-31 NOTE — Procedures (Signed)
Interventional Radiology Procedure Note  Procedure: Placement of 44F perc neph tube on the right.   Complications: None  Estimated Blood Loss: None  Recommendations: - Tube to bag   Signed,  Sterling Big, MD

## 2022-08-31 NOTE — Plan of Care (Signed)
  Problem: Health Behavior/Discharge Planning: Goal: Ability to manage health-related needs will improve Outcome: Progressing   Problem: Clinical Measurements: Goal: Ability to maintain clinical measurements within normal limits will improve Outcome: Progressing Goal: Will remain free from infection Outcome: Progressing Goal: Diagnostic test results will improve Outcome: Progressing Goal: Respiratory complications will improve Outcome: Progressing Goal: Cardiovascular complication will be avoided Outcome: Progressing   Problem: Activity: Goal: Risk for activity intolerance will decrease Outcome: Progressing   Problem: Nutrition: Goal: Adequate nutrition will be maintained Outcome: Progressing   Problem: Coping: Goal: Level of anxiety will decrease Outcome: Progressing   Problem: Elimination: Goal: Will not experience complications related to bowel motility Outcome: Progressing Goal: Will not experience complications related to urinary retention Outcome: Progressing   Problem: Pain Managment: Goal: General experience of comfort will improve Outcome: Progressing   Problem: Safety: Goal: Ability to remain free from injury will improve Outcome: Progressing   Problem: Skin Integrity: Goal: Risk for impaired skin integrity will decrease Outcome: Progressing   Problem: Education: Goal: Ability to describe self-care measures that may prevent or decrease complications (Diabetes Survival Skills Education) will improve Outcome: Progressing Goal: Individualized Educational Video(s) Outcome: Progressing   Problem: Coping: Goal: Ability to adjust to condition or change in health will improve Outcome: Progressing   Problem: Fluid Volume: Goal: Ability to maintain a balanced intake and output will improve Outcome: Progressing   Problem: Health Behavior/Discharge Planning: Goal: Ability to identify and utilize available resources and services will improve Outcome:  Progressing Goal: Ability to manage health-related needs will improve Outcome: Progressing   Problem: Metabolic: Goal: Ability to maintain appropriate glucose levels will improve Outcome: Progressing   Problem: Nutritional: Goal: Maintenance of adequate nutrition will improve Outcome: Progressing Goal: Progress toward achieving an optimal weight will improve Outcome: Progressing   Problem: Skin Integrity: Goal: Risk for impaired skin integrity will decrease Outcome: Progressing   Problem: Tissue Perfusion: Goal: Adequacy of tissue perfusion will improve Outcome: Progressing   Problem: Education: Goal: Knowledge of disease or condition will improve Outcome: Progressing Goal: Knowledge of secondary prevention will improve (MUST DOCUMENT ALL) Outcome: Progressing Goal: Knowledge of patient specific risk factors will improve Loraine Leriche N/A or DELETE if not current risk factor) Outcome: Progressing   Problem: Ischemic Stroke/TIA Tissue Perfusion: Goal: Complications of ischemic stroke/TIA will be minimized Outcome: Progressing   Problem: Coping: Goal: Will verbalize positive feelings about self Outcome: Progressing Goal: Will identify appropriate support needs Outcome: Progressing   Problem: Health Behavior/Discharge Planning: Goal: Ability to manage health-related needs will improve Outcome: Progressing Goal: Goals will be collaboratively established with patient/family Outcome: Progressing   Problem: Self-Care: Goal: Ability to participate in self-care as condition permits will improve Outcome: Progressing Goal: Verbalization of feelings and concerns over difficulty with self-care will improve Outcome: Progressing Goal: Ability to communicate needs accurately will improve Outcome: Progressing   Problem: Nutrition: Goal: Risk of aspiration will decrease Outcome: Progressing Goal: Dietary intake will improve Outcome: Progressing

## 2022-08-31 NOTE — Progress Notes (Addendum)
PROGRESS NOTE  Debra Sharp  MWU:[id number] DOB: [birth year] DOA: 08/18/2022 PCP: Clayborn Heron, MD   Brief Narrative:  Patient is a 72 year old female with past medical history of hypertension, hyperlipidemia, systolic CHF, thrombocytopenia, COPD, asthma who initially presented with weakness, abdominal pain.  Workup showed anemia concerning for upper GI bleed.  CT abdomen/pelvis showed colonic distention, possible obstruction.  GI consulted, plan was for EGD but she could not tolerate prep.  Also found to have right-sided hydronephrosis, incidental acute stroke on CT/MRI.  Repeat CT abdomen/pelvis on 7/6 showed new intraperitoneal free air with presumed perforation of the colon.  Surgery consulted and she underwent emergent exploratory laparotomy, right hemicolectomy, ileostomy with fistula, biopsy of pelvic mass.  Patient was left intubated and was transferred to ICU for pain management.  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  Palliative  care consulted.  General surgery following.  Currently on tube feeding.  Hospital course also remarkable for finding of acute PE now on heparin drip.  Now extubated and transferred to [facility] service on 7/12.  Plan was for TEE which has been canceled because patient declined.  Palliative care, urology ,surgery ,neurology were following.  PT/OT recommending SNF on discharge.  Hospital course remarkable for persistent leukocytosis.  Assessment & Plan:  Principal Problem:   GI bleed Active Problems:   Symptomatic anemia   Thrombocytosis   Iron deficiency anemia   Abnormal CT of the abdomen   Colon distention   History of cardioembolic cerebrovascular accident (CVA)   Silent cerebral infarction (HCC)   Bowel perforation (HCC)   Ventilator dependent (HCC)   Pelvic mass   Change in bowel habits   Abnormal CT scan, gastrointestinal tract   Arterial hypotension   Septic shock (HCC)   Fecal occult blood test positive   Uterine leiomyoma    Hydroureteronephrosis   Sepsis with acute hypoxic respiratory failure and septic shock (HCC)   Encephalopathy acute   Perforation bowel (HCC)   Malnutrition of moderate degree   Cerebrovascular accident (CVA) due to embolism of precerebral artery (HCC)   Acute metabolic encephalopathy   Delirium  Acute hypoxic respiratory failure: Intubated postoperatively.  Currently extubated and on room air.    Septic shock secondary to acute peritonitis due to colonic perforation due to obstructing pelvic mass: Status post exploratory laparotomy with right hemicolectomy, end ileostomy creation.  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  Currently in broad spectrum antibiotics with micafungin, Flagyl, cefepime.  Wound  cultures NGTD.  On tube feed.Speech therapy consulted to advance the diet by mouth  Currently hemodynamically stable.  General surgery following.  Leukocytosis persist.  She is afebrile.  Denies abdomen, nausea or vomiting. CT abdomen/pelvis for follow-up for leukocytosis was done on 7/14 showed possibility of new abscesses, postoperative ileus, severe right hydroureteronephrosis.  IR declined to drain for new finding of abscess saying that they are too small and may not be abscess at all.  Pelvic mass: Culprit for colonic perforation.  Initially concern for gynecological malignancy with elevated CA125, CA 19-19 but pathology showed small bowel primary.   General surgery recommending arranging outpatient follow-up with oncology before discharge, no need for immediate consultation.  I have sent message to Dr. Leonides Schanz regarding follow-up appointment before discharge. Follow up CT showed large complex mass occupying the majority of the low right pelvis grossly unchanged.  Acute metabolic encephalopathy: Likely from sepsis, ICU delirium, CVAs.  Checking follow-up CT head  Acute CVA: MRI done on 7/4 showed single  punctuate enhancement in the right frontoparietal area, no evidence  of mets.  EEG done on 7/9 did not show any seizure.  Suspected to be embolic CVA versus secondary to hypoperfusion in the setting of anemia/septic shock.  Neurology were following.  There was plan for TEE but patient has declined.  Plan to start on Plavix at some point.  Continue statin.  PT/OT/SLP following.  Recommended SNF on discharge She has left-sided weakness. Hardly  able to get up from the bed.  Acute PE: CT angiogram showed acute PE.  Currently on heparin drip.  No evidence of right heart strain.  Will start her on Eliquis when surgery clears  AKI/hyperkalemia/hypophosphatemia:   Currently kidney function is stable.Phosphorus being supplemented  Hydronephrosis secondary to abdominal mass: Urology following.  Follow-up CT showed persistent severe right hydroureteronephrosis.  IR planning for PCNL placement  Acute on chronic renal disease anemia/acute blood loss anemia secondary to surgery/chronic GI bleed due to small bowel adenocarcinoma: Monitor hemoglobin.  Transfuse if less than 7.  Currently on PPI.  Currently hemoglobin is stable   Hypertension: Off hypertensives.  Monitor blood pressure  Hyperlipidemia: Currently on Lipitor  Chronic systolic CHF: Echo on 08/18/2020 showed EF of 40 to 45%, global hypokinesis, normal right ventricular function.  Previous echo on March 2024 showed EF of 30 to 35%.  We recommend follow-up with cardiology as an outpatient  Hyperglycemia: Currently on sliding scale insulin.  Monitor blood sugars  Left upper extremity edema: This is most likely from IV line.  Will check venous Doppler to rule out DVT  Goals of care: Multiple comorbidities.  General surgery recommended palliative care consulted for new finding of malignancy, and incomplete restriction.  Palliative care are following.  Remains full code.         Nutrition Problem: Moderate Malnutrition Etiology: chronic illness    DVT prophylaxis:SCDs Start: 08/18/22 1710     Code Status: Full  Code  Family Communication: Discussed with son at bedside on 7/15  Patient status:Inpatient  Patient is from :home  Anticipated discharge to:SnF  Estimated DC date:after general surgery clearance   Consultants: General surgery, PCCM, palliative care, neurology  Procedures: Exploratory laparotomy  Antimicrobials:  Anti-infectives (From admission, onward)    Start     Dose/Rate Route Frequency Ordered Stop   08/30/22 0830  ceFEPIme (MAXIPIME) 2 g in sodium chloride 0.9 % 100 mL IVPB        2 g 200 mL/hr over 30 Minutes Intravenous Every 8 hours 08/30/22 0826     08/23/22 2100  ceFEPIme (MAXIPIME) 2 g in sodium chloride 0.9 % 100 mL IVPB  Status:  Discontinued        2 g 200 mL/hr over 30 Minutes Intravenous Every 12 hours 08/23/22 1357 08/30/22 0826   08/23/22 0400  micafungin (MYCAMINE) 100 mg in sodium chloride 0.9 % 100 mL IVPB        100 mg 105 mL/hr over 1 Hours Intravenous Daily 08/23/22 0242     08/22/22 1800  metroNIDAZOLE (FLAGYL) IVPB 500 mg        500 mg 100 mL/hr over 60 Minutes Intravenous Every 12 hours 08/22/22 1711     08/22/22 1800  ceFEPIme (MAXIPIME) 2 g in sodium chloride 0.9 % 100 mL IVPB  Status:  Discontinued        2 g 200 mL/hr over 30 Minutes Intravenous Every 8 hours 08/22/22 1716 08/23/22 1357   08/22/22 1724  metroNIDAZOLE (FLAGYL) 500 MG/100ML IVPB  Note to Pharmacy: [name]: cabinet override      08/22/22 1724 08/23/22 0845   08/19/22 1030  cefTRIAXone (ROCEPHIN) 1 g in sodium chloride 0.9 % 100 mL IVPB  Status:  Discontinued        1 g 200 mL/hr over 30 Minutes Intravenous Every 24 hours 08/19/22 1610 08/22/22 1715       Subjective:  Patient seen and examined the bedside today.  Hemodynamically stable.  Found to be slightly confused during my evaluation today.  She says she had brother's home.  Not in any distress but denies any abdomen, nausea or vomiting.  Objective: Vitals:   08/31/22 0746 08/31/22 0749 08/31/22 0751  08/31/22 0800  BP:    126/65  Pulse: (!) 105   (!) 105  Resp: 18   18  Temp:    98 F (36.7 C)  TempSrc:    Oral  SpO2: 98% 99% 99% 98%  Weight:      Height:        Intake/Output Summary (Last 24 hours) at 08/31/2022 1121 Last data filed at 08/31/2022 0400 Gross per 24 hour  Intake 1297.41 ml  Output 3445 ml  Net -2147.59 ml   Filed Weights   08/29/22 0500 08/30/22 0348 08/31/22 0443  Weight: 79.8 kg 80 kg 81.7 kg    Examination:   General exam: Overall comfortable, not in distress, weak, lying in bed, confused HEENT: PERRL, feeding tube Respiratory system:  no wheezes or crackles  Cardiovascular system: S1 & S2 heard, RRR.  Gastrointestinal system: Abdomen is nondistended, soft and .  Ostomy pouch, left drain. Central nervous system: Alert and awake, obeys commands but confused to time and place.  Has left-sided weakness  extremities: Bilateral lower EXTR pitting edema, no clubbing ,no cyanosis, edema of the left upper extremity Skin: No rashes, no ulcers,no icterus        Data Reviewed: I have personally reviewed following labs and imaging studies  CBC: Recent Labs  Lab 08/27/22 0741 08/28/22 0753 08/29/22 0436 08/30/22 0123 08/31/22 0150  WBC 19.4* 19.9* 21.2* 24.5* 20.3*  HGB 10.7* 11.9* 9.9* 9.2* 9.4*  HCT 33.7* 38.8 32.4* 31.1* 31.3*  MCV 79.7* 83.8 80.6 82.3 83.0  PLT 229 245 320 467* 655*   Basic Metabolic Panel: Recent Labs  Lab 08/26/22 0512 08/26/22 1659 08/27/22 0741 08/27/22 1712 08/28/22 0753 08/29/22 0436 08/30/22 0123 08/31/22 0150  NA 138  --  138  --  139  --  137 133*  K 4.1  --  4.1  --  4.0  --  4.0 4.1  CL 105  --  109  --  110  --  106 108  CO2 23  --  22  --  20*  --  21* 17*  GLUCOSE 96  --  142*  --  101*  --  127* 130*  BUN 34*  --  34*  --  34*  --  30* 24*  CREATININE 1.17*  --  0.99  --  0.94  --  0.79 0.71  CALCIUM 8.2*  --  8.0*  --  7.9*  --  7.5* 7.5*  MG 2.1 2.1 2.0 1.9  --  1.7 1.8  --   PHOS 3.5 2.9 2.6 1.9*   --  2.6 2.4* 2.8     Recent Results (from the past 240 hour(s))  MRSA Next Gen by PCR, Nasal     Status: None   Collection Time: 08/22/22  8:15 PM  Specimen: Nasal Mucosa; Nasal Swab  Result Value Ref Range Status   MRSA by PCR Next Gen NOT DETECTED NOT DETECTED Final    Comment: (NOTE) The GeneXpert MRSA Assay (FDA approved for NASAL specimens only), is one component of a comprehensive MRSA colonization surveillance program. It is not intended to diagnose MRSA infection nor to guide or monitor treatment for MRSA infections. Test performance is not FDA approved in patients less than 5 years old. Performed at [facility], [address], [place], [place] [address]   Culture, blood (Routine X 2) w Reflex to ID Panel     Status: None   Collection Time: 08/22/22  8:52 PM   Specimen: BLOOD  Result Value Ref Range Status   Specimen Description BLOOD  Final   Special Requests   Final    LINE DRAW BOTTLES DRAWN AEROBIC AND ANAEROBIC Blood Culture adequate volume   Culture   Final    NO GROWTH 5 DAYS Performed at [facility], [address] [place], [place] [address]    Report Status 08/27/2022 FINAL  Final  Culture, blood (Routine X 2) w Reflex to ID Panel     Status: None   Collection Time: 08/22/22  9:08 PM   Specimen: BLOOD  Result Value Ref Range Status   Specimen Description BLOOD  Final   Special Requests   Final    LINE DRAW BOTTLES DRAWN AEROBIC AND ANAEROBIC Blood Culture adequate volume   Culture   Final    NO GROWTH 5 DAYS Performed at [facility], [address] [place], [place] [address]    Report Status 08/27/2022 FINAL  Final     Radiology Studies: CT ABDOMEN PELVIS W CONTRAST  Result Date: 08/30/2022 CLINICAL DATA:  Abdominal pain. Right hemicolectomy and ileostomy 8 days ago. Now with feculent drainage EXAM: CT ABDOMEN AND PELVIS WITH CONTRAST TECHNIQUE: Multidetector CT imaging of the abdomen and pelvis was performed using the  standard protocol following bolus administration of intravenous contrast. RADIATION DOSE REDUCTION: This exam was performed according to the departmental dose-optimization program which includes automated exposure control, adjustment of the mA and/or kV according to patient size and/or use of iterative reconstruction technique. CONTRAST:  75mL OMNIPAQUE IOHEXOL 350 MG/ML SOLN COMPARISON:  08/22/2022, 08/19/2022 FINDINGS: Lower chest: Small bilateral pleural effusions, left greater than right. Effusions have increased in size from prior. Associated compressive atelectasis. Hepatobiliary: Liver is enlarged measuring approximately 21 cm in length. No focal liver lesion identified. Gallbladder within normal limits. Pancreas: Unremarkable. No pancreatic ductal dilatation or surrounding inflammatory changes. Spleen: Enlarged spleen with new, near-complete hypoattenuation of the splenic parenchyma. There also appears to be subcapsular low-density fluid along the peripheral margin of the spleen. Adrenals/Urinary Tract: Unremarkable adrenal glands. Severe right hydroureteronephrosis. Layering high attenuation material within the right renal collecting system is likely related to contrast excretion. New mild left hydronephrosis. Urinary bladder within normal limits. Stomach/Bowel: Enteric tube extends into the stomach with distal tip terminating at the level of the duodenal bulb. Interval partial right hemicolectomy with right lower quadrant ostomy. Multiple mildly dilated small bowel loops measuring up to 4.4 cm in diameter. Numerous air-fluid levels. Enteric contrast has partially traversed small bowel. Vascular/Lymphatic: Aortic atherosclerosis. No definite lymphadenopathy. Reproductive: Large calcified uterine fibroid. Ovaries are not definitively seen. Other: Midline surgical incision site. Surgical drain is present within the pelvis with distal tip terminating in the lower right hemiabdomen. Small volume fluid within  the abdomen and pelvis with  several collections demonstrating partial rim enhancement. For example, collection within the lower right pericolic gutter measures approximately 5.2 x 3.5 x 4.1 cm (series 3, image 58). Collection along the anterior margin of the uterine fundus measures approximately 7.2 x 1.8 x 5.1 cm (series 3, image 69). Small amount of air is noted along the course of the surgical drain. No significant volume of pneumoperitoneum. Large complex mass occupying the majority of the low right pelvis is grossly unchanged. Musculoskeletal: No acute osseous abnormality. Increased body wall edema. IMPRESSION: 1. Interval postsurgical changes to the abdomen and pelvis including partial right hemicolectomy with right lower quadrant ostomy. 2. Small volume fluid within the abdomen and pelvis with several collections demonstrating rim-enhancement suggestive of abscesses. 3. New, near-complete hypoattenuation of the splenic parenchyma with subcapsular low-density fluid along the peripheral margin of the spleen. Findings are concerning for global splenic infarct. 4. Multiple mildly dilated small bowel loops with numerous air-fluid levels. Findings are favored to represent postoperative ileus. 5. Persistent severe right hydroureteronephrosis. New mild left hydronephrosis. 6. Small bilateral pleural effusions, left greater than right. Effusions have increased in size from prior. 7. Large complex mass occupying the majority of the low right pelvis is grossly unchanged. 8. Hepatomegaly. 9. Aortic atherosclerosis (ICD10-I70.0). These results will be called to the ordering clinician or representative by the Radiologist Assistant, and communication documented in the PACS or [employer]. Electronically Signed   By: Duanne Guess D.O.   On: 08/30/2022 15:05    Scheduled Meds:  acetaminophen (TYLENOL) oral liquid 160 mg/5 mL  650 mg Per Tube Q6H   arformoterol  15 mcg Nebulization BID   atorvastatin  20 mg Per  Tube Daily   budesonide (PULMICORT) nebulizer solution  0.5 mg Nebulization BID   feeding supplement (PROSource TF20)  60 mL Per Tube Daily   ferrous sulfate  325 mg Oral BID WC   insulin aspart  0-9 Units Subcutaneous Q4H   loperamide  2 mg Oral BID   multivitamin with minerals  1 tablet Per Tube Daily   pantoprazole (PROTONIX) IV  40 mg Intravenous Q12H   phosphorus  500 mg Per Tube TID   polycarbophil  625 mg Per Tube BID   revefenacin  175 mcg Nebulization Daily   tamsulosin  0.4 mg Oral Daily   Continuous Infusions:  sodium chloride Stopped (08/29/22 1855)   ceFEPime (MAXIPIME) IV 2 g (08/31/22 0935)   feeding supplement (VITAL 1.5 CAL) Stopped (08/31/22 1037)   heparin Stopped (08/31/22 1115)   metronidazole 500 mg (08/31/22 0619)   micafungin (MYCAMINE) 100 mg in sodium chloride 0.9 % 100 mL IVPB 100 mg (08/31/22 0819)     LOS: 13 days   Burnadette Pop, MD [facility] P7/15/2024, 11:21 AM

## 2022-08-31 NOTE — Consult Note (Signed)
Chief Complaint: Patient was seen in consultation today for  Chief Complaint  Patient presents with   Weakness   Referring Physician(s): Elmon Kirschner, NP Urology  Supervising Physician: Malachy Moan  Patient Status: [facility] - In-pt  History of Present Illness: Debra Sharp is a 72 y.o. female with a medical history significant for IDA, HTN, and thrombocytopenia who presented to the ED 08/18/22 with worsening generalized weakness, new onset abdominal pain, nausea and vomiting. Patient also had complaints of exertional dyspnea. In the ED she was found to be tachycardic with a hemoglobin of 4.6 and platelet count of 1258. MRI brain demonstrated incidental strokes in the right parietal lobe. Additional imaging including CT scans showed right hydronephrosis and a colon mass in the right pelvis. She was admitted for further work up.   GI, Neurology, Urology and Surgical teams consulted and her hospital stay was complicated by development of colon perforation with pneumoperitoneum. She was taken to the OR 08/22/22 for an emergent exploratory laparotomy, right hemicolectomy, ileostomy creation and biopsy of the pelvic mass. 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   A repeat CT scan 08/30/22 showed continued hydronephrosis as well as several intra-abdominal fluid collections.   CT abdomen/pelvis 08/30/22 IMPRESSION: 1. Interval postsurgical changes to the abdomen and pelvis including partial right hemicolectomy with right lower quadrant ostomy. 2. Small volume fluid within the abdomen and pelvis with several collections demonstrating rim-enhancement suggestive of abscesses. 3. New, near-complete hypoattenuation of the splenic parenchyma with subcapsular low-density fluid along the peripheral margin of the spleen. Findings are concerning for global splenic infarct. 4. Multiple mildly dilated small bowel  loops with numerous air-fluid levels. Findings are favored to represent postoperative ileus. 5. Persistent severe right hydroureteronephrosis. New mild left hydronephrosis. 6. Small bilateral pleural effusions, left greater than right. Effusions have increased in size from prior. 7. Large complex mass occupying the majority of the low right pelvis is grossly unchanged. 8. Hepatomegaly. 9. Aortic atherosclerosis (ICD10-I70.0).  Interventional Radiology has been asked by Urology to evaluate this patient for an image-guided right percutaneous nephrostomy tube. We have also been asked by Surgery to evaluate this patient for possible drain placement for the intra-abdominal fluid collections. Imaging reviewed by Dr. Archer Asa.   Past Medical History:  Diagnosis Date   Anemia    Anxiety    Arthritis    Asthma    Blood transfusion without reported diagnosis    Cataract    removed bilateral   COPD (chronic obstructive pulmonary disease) (HCC)    Depression    GERD (gastroesophageal reflux disease)    Hyperlipidemia    Hypertension     Past Surgical History:  Procedure Laterality Date   APPENDECTOMY     cataracts Bilateral    COLOSTOMY N/A 08/22/2022   Procedure: COLOSTOMY;  Surgeon: Diamantina Monks, MD;  Location: [facility] OR;  Service: General;  Laterality: N/A;   KNEE SURGERY     LAPAROTOMY N/A 08/22/2022   Procedure: EXPLORATORY LAPAROTOMY;  Surgeon: Diamantina Monks, MD;  Location: [facility] OR;  Service: General;  Laterality: N/A;   NASAL SINUS SURGERY     PARTIAL COLECTOMY N/A 08/22/2022   Procedure: PARTIAL COLECTOMY;  Surgeon: Diamantina Monks, MD;  Location: [facility] OR;  Service: General;  Laterality: N/A;    Allergies: Aspirin, Ibuprofen, Sulfonamide derivatives, and Penicillins  Medications: Prior to Admission medications   Medication Sig Start Date End Date Taking? Authorizing Provider  cetirizine (ZYRTEC) 10 MG tablet  Take 1 tablet (10 mg total) by mouth daily. Patient taking  differently: Take 10 mg by mouth daily as needed for allergies or rhinitis. 10/09/19  Yes Hawks, Christy A, FNP  ferrous sulfate 325 (65 FE) MG EC tablet Take 325 mg by mouth daily.   Yes [provider]  fluticasone (FLONASE) 50 MCG/ACT nasal spray Place 2 sprays into both nostrils daily. Patient taking differently: Place 2 sprays into both nostrils at bedtime as needed for allergies or rhinitis. 10/09/19  Yes Hawks, Christy A, FNP  lisinopril (ZESTRIL) 40 MG tablet Take 40 mg by mouth in the morning.   Yes [provider]  lovastatin (MEVACOR) 20 MG tablet Take 20 mg by mouth at bedtime.   Yes [provider]  PROAIR HFA 108 (90 Base) MCG/ACT inhaler Inhale 2 puffs into the lungs every 6 (six) hours as needed for wheezing or shortness of breath.   Yes [provider]  sertraline (ZOLOFT) 100 MG tablet Take 150 mg by mouth in the morning.   Yes [provider]  SYMBICORT 160-4.5 MCG/ACT inhaler Inhale 2 puffs into the lungs in the morning and at bedtime.   Yes [provider]     Family History  Problem Relation Age of Onset   Colon cancer Neg Hx    Colon polyps Neg Hx    Crohn's disease Neg Hx    Esophageal cancer Neg Hx    Rectal cancer Neg Hx    Stomach cancer Neg Hx    Ulcerative colitis Neg Hx     Social History   Socioeconomic History   Marital status: Divorced    Spouse name: Not on file   Number of children: Not on file   Years of education: Not on file   Highest education level: Not on file  Occupational History   Not on file  Tobacco Use   Smoking status: Former    Types: Cigarettes   Smokeless tobacco: Never  Vaping Use   Vaping status: Never Used  Substance and Sexual Activity   Alcohol use: No   Drug use: No   Sexual activity: Not on file  Other Topics Concern   Not on file  Social History Narrative   Not on file   Social Determinants of Health   Financial Resource Strain: Not on file  Food Insecurity:  No Food Insecurity (08/19/2022)   Hunger Vital Sign    Worried About Running Out of Food in the Last Year: Never true    Ran Out of Food in the Last Year: Never true  Transportation Needs: No Transportation Needs (08/19/2022)   PRAPARE - Administrator, Civil Service (Medical): No    Lack of Transportation (Non-Medical): No  Physical Activity: Not on file  Stress: Not on file  Social Connections: Not on file    Review of Systems: A 12 point ROS discussed and pertinent positives are indicated in the HPI above.  All other systems are negative.  Review of Systems  Constitutional:  Positive for appetite change and fatigue.  Respiratory:  Negative for cough and shortness of breath.   Cardiovascular:  Negative for chest pain and leg swelling.  Gastrointestinal:  Positive for abdominal pain, nausea and vomiting.  Neurological:  Negative for dizziness and headaches.    Vital Signs: BP 122/60 (BP Location: Right Wrist)   Pulse (!) 104   Temp 98 F (36.7 C) (Oral)   Resp 18   Ht 5' 5.98" (1.676 m)  Wt 180 lb 1.9 oz (81.7 kg)   SpO2 100%   BMI 29.09 kg/m   Physical Exam Constitutional:      General: She is not in acute distress. HENT:     Mouth/Throat:     Mouth: Mucous membranes are moist.     Pharynx: Oropharynx is clear.  Cardiovascular:     Rate and Rhythm: Normal rate and regular rhythm.  Pulmonary:     Effort: Pulmonary effort is normal.  Abdominal:     Tenderness: There is abdominal tenderness.     Comments: Midline incision covered with dressing. Right sided ileostomy.   Musculoskeletal:     Right lower leg: No edema.     Left lower leg: No edema.  Skin:    General: Skin is warm and dry.  Neurological:     Mental Status: She is alert and oriented to person, place, and time.     Imaging: CT ABDOMEN PELVIS W CONTRAST  Result Date: 08/30/2022 CLINICAL DATA:  Abdominal pain. Right hemicolectomy and ileostomy 8 days ago. Now with feculent drainage EXAM:  CT ABDOMEN AND PELVIS WITH CONTRAST TECHNIQUE: Multidetector CT imaging of the abdomen and pelvis was performed using the standard protocol following bolus administration of intravenous contrast. RADIATION DOSE REDUCTION: This exam was performed according to the departmental dose-optimization program which includes automated exposure control, adjustment of the mA and/or kV according to patient size and/or use of iterative reconstruction technique. CONTRAST:  75mL OMNIPAQUE IOHEXOL 350 MG/ML SOLN COMPARISON:  08/22/2022, 08/19/2022 FINDINGS: Lower chest: Small bilateral pleural effusions, left greater than right. Effusions have increased in size from prior. Associated compressive atelectasis. Hepatobiliary: Liver is enlarged measuring approximately 21 cm in length. No focal liver lesion identified. Gallbladder within normal limits. Pancreas: Unremarkable. No pancreatic ductal dilatation or surrounding inflammatory changes. Spleen: Enlarged spleen with new, near-complete hypoattenuation of the splenic parenchyma. There also appears to be subcapsular low-density fluid along the peripheral margin of the spleen. Adrenals/Urinary Tract: Unremarkable adrenal glands. Severe right hydroureteronephrosis. Layering high attenuation material within the right renal collecting system is likely related to contrast excretion. New mild left hydronephrosis. Urinary bladder within normal limits. Stomach/Bowel: Enteric tube extends into the stomach with distal tip terminating at the level of the duodenal bulb. Interval partial right hemicolectomy with right lower quadrant ostomy. Multiple mildly dilated small bowel loops measuring up to 4.4 cm in diameter. Numerous air-fluid levels. Enteric contrast has partially traversed small bowel. Vascular/Lymphatic: Aortic atherosclerosis. No definite lymphadenopathy. Reproductive: Large calcified uterine fibroid. Ovaries are not definitively seen. Other: Midline surgical incision site. Surgical  drain is present within the pelvis with distal tip terminating in the lower right hemiabdomen. Small volume fluid within the abdomen and pelvis with several collections demonstrating partial rim enhancement. For example, collection within the lower right pericolic gutter measures approximately 5.2 x 3.5 x 4.1 cm (series 3, image 58). Collection along the anterior margin of the uterine fundus measures approximately 7.2 x 1.8 x 5.1 cm (series 3, image 69). Small amount of air is noted along the course of the surgical drain. No significant volume of pneumoperitoneum. Large complex mass occupying the majority of the low right pelvis is grossly unchanged. Musculoskeletal: No acute osseous abnormality. Increased body wall edema. IMPRESSION: 1. Interval postsurgical changes to the abdomen and pelvis including partial right hemicolectomy with right lower quadrant ostomy. 2. Small volume fluid within the abdomen and pelvis with several collections demonstrating rim-enhancement suggestive of abscesses. 3. New, near-complete hypoattenuation of the splenic parenchyma with  subcapsular low-density fluid along the peripheral margin of the spleen. Findings are concerning for global splenic infarct. 4. Multiple mildly dilated small bowel loops with numerous air-fluid levels. Findings are favored to represent postoperative ileus. 5. Persistent severe right hydroureteronephrosis. New mild left hydronephrosis. 6. Small bilateral pleural effusions, left greater than right. Effusions have increased in size from prior. 7. Large complex mass occupying the majority of the low right pelvis is grossly unchanged. 8. Hepatomegaly. 9. Aortic atherosclerosis (ICD10-I70.0). These results will be called to the ordering clinician or representative by the Radiologist Assistant, and communication documented in the PACS or [employer]. Electronically Signed   By: Duanne Guess D.O.   On: 08/30/2022 15:05   CT Angio Chest Pulmonary Embolism  (PE) W or WO Contrast  Result Date: 08/27/2022 CLINICAL DATA:  Pulmonary embolism (PE) suspected, high prob Weakness and shortness of breath. EXAM: CT ANGIOGRAPHY CHEST WITH CONTRAST TECHNIQUE: Multidetector CT imaging of the chest was performed using the standard protocol during bolus administration of intravenous contrast. Multiplanar CT image reconstructions and MIPs were obtained to evaluate the vascular anatomy. RADIATION DOSE REDUCTION: This exam was performed according to the departmental dose-optimization program which includes automated exposure control, adjustment of the mA and/or kV according to patient size and/or use of iterative reconstruction technique. CONTRAST:  75mL OMNIPAQUE IOHEXOL 350 MG/ML SOLN COMPARISON:  Radiograph 08/23/2022 FINDINGS: Cardiovascular: Positive for small volume acute pulmonary embolus, involving a subsegmental branch in the right upper lobe, for example series 7, image 99. Small amount of thrombus within right lower lobar pulmonary artery series 7, image 141, extending into the segmental and subsegmental branches. Thromboembolic burden is small to moderate. There is no right heart strain. Moderate calcified as well as noncalcified atheromatous plaque throughout the thoracic aorta, including rounded plaque in the descending aorta series 5, images 84 and 106. No aortic aneurysm. There are coronary artery calcifications. Heart size upper normal. Suggestion of lipomatous hypertrophy of the interatrial septum. Small hiatal hernia. Mediastinum/Nodes: Enteric tube tip in the stomach. Well-circumscribed fluid density structure in the anterior mediastinum measures 2.1 x 2.1 cm series 5, image 57. Well-defined rounded fat density structures in the left suprahilar region, Hounsfield units of -124, 18 and 7 mm series 5, image 57. This appears to be contiguous with the mediastinal fat. Lungs/Pleura: Small to moderate left and small right pleural effusion. Associated compressive  atelectasis. No definite pulmonary infarct. 3-4 mm subpleural right upper lobe nodule has mild surrounding ground-glass, series 6, image 55. No endobronchial debris. Upper Abdomen: Small amount of free fluid in the left upper quadrant. Musculoskeletal: There are no acute or suspicious osseous abnormalities. Review of the MIP images confirms the above findings. IMPRESSION: 1. Positive for acute pulmonary embolus, involving a subsegmental branch in the right upper lobe and right lower lobar pulmonary artery. Thromboembolic burden is small to moderate. No right heart strain. 2. Small to moderate left and small right pleural effusions with compressive atelectasis. 3. Well-circumscribed fluid density structure in the anterior mediastinum measuring 2.1 x 2.1 cm. This may represent a thymic cyst. Recommend MRI for further assessment on an elective basis after resolution of acute event. 4. There is also a well-defined fat density structure in the left suprahilar region which appears to be contiguous with the mediastinal fat. Etiology is uncertain. 5. A 3-4 mm subpleural right upper lobe nodule has mild surrounding ground-glass. This is likely infectious or inflammatory. Per Fleischner Society Guidelines, no routine follow-up imaging is recommended. These guidelines do not apply to  immunocompromised patients and patients with cancer. Follow up in patients with significant comorbidities as clinically warranted. For lung cancer screening, adhere to Lung-RADS guidelines. Reference: Radiology. 2017; 284(1):228-43. 6. Moderate calcified as well as noncalcified atheromatous plaque throughout the thoracic aorta, including rounded plaque in the descending aorta. 7. Aortic Atherosclerosis (ICD10-I70.0). Coronary artery calcifications. Critical Value/emergent results were called by telephone at the time of interpretation on 08/27/2022 at 6:56 PM to provider Karie Fetch , who verbally acknowledged these results. Electronically Signed    By: Narda Rutherford M.D.   On: 08/27/2022 19:02   VAS [place] LOWER EXTREMITY VENOUS (DVT)  Result Date: 08/27/2022  Lower Venous DVT Study Patient Name:  Debra Sharp  Date of Exam:   08/27/2022 Medical Rec #: [id number]      Accession #:    [id number] Date of Birth: 1950/08/04      Patient Gender: F Patient Age:   60 years Exam Location:  [facility] Procedure:      VAS [place] LOWER EXTREMITY VENOUS (DVT) Referring Phys: Scheryl Marten XU --------------------------------------------------------------------------------  Indications: Hypercoagulable state.  Limitations: Body habitus and poor ultrasound/tissue interface. Comparison Study: No previous exams Performing Technologist: Jody Hill RVT, RDMS  Examination Guidelines: A complete evaluation includes B-mode imaging, spectral Doppler, color Doppler, and power Doppler as needed of all accessible portions of each vessel. Bilateral testing is considered an integral part of a complete examination. Limited examinations for reoccurring indications may be performed as noted. The reflux portion of the exam is performed with the patient in reverse Trendelenburg.  +---------+---------------+---------+-----------+----------+-------------------+ RIGHT    CompressibilityPhasicitySpontaneityPropertiesThrombus Aging      +---------+---------------+---------+-----------+----------+-------------------+ CFV      Full           Yes      Yes                                      +---------+---------------+---------+-----------+----------+-------------------+ SFJ      Full                                                             +---------+---------------+---------+-----------+----------+-------------------+ FV Prox  Full           Yes      Yes                                      +---------+---------------+---------+-----------+----------+-------------------+ FV Mid   Full           Yes      Yes                  Not well visualized  +---------+---------------+---------+-----------+----------+-------------------+ FV DistalFull           Yes      Yes                                      +---------+---------------+---------+-----------+----------+-------------------+ PFV                     Yes      Yes                                      +---------+---------------+---------+-----------+----------+-------------------+  POP      Full           Yes      Yes                                      +---------+---------------+---------+-----------+----------+-------------------+ PTV                                                   Not visualized      +---------+---------------+---------+-----------+----------+-------------------+ PERO     Full                                         Not well visualized +---------+---------------+---------+-----------+----------+-------------------+   +---------+---------------+---------+-----------+----------+-------------------+ LEFT     CompressibilityPhasicitySpontaneityPropertiesThrombus Aging      +---------+---------------+---------+-----------+----------+-------------------+ CFV      Full           Yes      Yes                                      +---------+---------------+---------+-----------+----------+-------------------+ SFJ      Full                                                             +---------+---------------+---------+-----------+----------+-------------------+ FV Prox  Full           Yes      Yes                                      +---------+---------------+---------+-----------+----------+-------------------+ FV Mid   Full           Yes      Yes                  Not well visualized +---------+---------------+---------+-----------+----------+-------------------+ FV DistalFull           Yes      Yes                                      +---------+---------------+---------+-----------+----------+-------------------+  PFV                     Yes      Yes                                      +---------+---------------+---------+-----------+----------+-------------------+ POP      Full           Yes      Yes                                      +---------+---------------+---------+-----------+----------+-------------------+  PTV                                                   Not visualized      +---------+---------------+---------+-----------+----------+-------------------+ PERO                                                  Not visualized      +---------+---------------+---------+-----------+----------+-------------------+    Summary: BILATERAL: -No evidence of popliteal cyst, bilaterally. RIGHT: - There is no evidence of deep vein thrombosis in the lower extremity. However, portions of this examination were limited- see technologist comments above.  LEFT: - There is no evidence of deep vein thrombosis in the lower extremity. However, portions of this examination were limited- see technologist comments above.  *See table(s) above for measurements and observations.    Preliminary    US RENAL  Result Date: 08/27/2022 CLINICAL DATA:  Hydronephrosis EXAM: RENAL / URINARY TRACT ULTRASOUND COMPLETE COMPARISON:  CT abdomen pelvis 08/22/2022 FINDINGS: Right Kidney: Renal measurements: 11.1 x 5.8 x 5.4 cm = volume: 181 mL. Diffusely thinned cortex. Unchanged severe hydronephrosis. Left Kidney: Renal measurements: 11.9 x 8.4 x 5.4 cm = volume: 16 mL. Echogenicity within normal limits. No mass or hydronephrosis visualized. Bladder: Not visualized. Other: None. IMPRESSION: Unchanged severe right hydronephrosis. Electronically Signed   By: Acquanetta Belling M.D.   On: 08/27/2022 12:00   CT ANGIO HEAD NECK W WO CM  Result Date: 08/26/2022 CLINICAL DATA:  Follow-up embolic strokes EXAM: CT ANGIOGRAPHY HEAD AND NECK WITH AND WITHOUT CONTRAST TECHNIQUE: Multidetector CT imaging of the head and neck was performed  using the standard protocol during bolus administration of intravenous contrast. Multiplanar CT image reconstructions and MIPs were obtained to evaluate the vascular anatomy. Carotid stenosis measurements (when applicable) are obtained utilizing NASCET criteria, using the distal internal carotid diameter as the denominator. RADIATION DOSE REDUCTION: This exam was performed according to the departmental dose-optimization program which includes automated exposure control, adjustment of the mA and/or kV according to patient size and/or use of iterative reconstruction technique. 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 No evidence of progression or hemorrhage. No hydrocephalus or shift. Vascular: See below Skull: No acute or aggressive Sinuses/Orbits: Finding sinonasal polyps. Chronic left sphenoid sinusitis with sclerotic wall thickening. Review of the MIP images confirms the above findings CTA NECK FINDINGS Aortic arch: Atheromatous plaque with 3 vessel branching. Right carotid system: Mainly calcified atherosclerosis at the bifurcation without stenosis or ulceration. Left carotid system: Mainly calcified atherosclerosis at the bifurcation without stenosis or ulceration. 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 The left vertebral artery is dominant. At the proximal right V2 segment there is hazy hypoenhancement of the right vertebral lumen. No ulceration, beading, or dissection flap. Robust and symmetric flow by the V4 segments. Skeleton: Generalized cervical spine degeneration. Other neck: No acute finding Upper chest: Layering pleural effusions. Left IJ line with tip at the SVC. An enteric tube traverses the esophagus. Review of the MIP images confirms the above  findings CTA HEAD FINDINGS Anterior circulation: Atheromatous plaque is mild. No major branch occlusion, beading, or irregularity. Hypoplastic right A1 segment. Negative for aneurysm. Posterior circulation: Left dominant vertebral artery. The vertebral and basilar arteries are smoothly contoured and diffusely patent. Mild atheromatous irregularity of the posterior cerebral arteries. Venous sinuses: Unremarkable Anatomic variants: None significant Review of the MIP images confirms the above findings IMPRESSION: 1. No emergent arterial finding. 2. 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 3. Short segment of right V2 hypoenhancement without discrete underlying stenosis or dissection flap. 4. No flow limiting stenosis in the anterior circulation. Electronically Signed   By: Tiburcio Pea M.D.   On: 08/26/2022 20:24   DG Abd Portable 1V  Result Date: 08/26/2022 CLINICAL DATA:  Feeding tube placement. EXAM: PORTABLE ABDOMEN - 1 VIEW COMPARISON:  August 25, 2022. FINDINGS: Distal tip of feeding tube is seen in expected position of distal stomach. IMPRESSION: Distal tip of feeding tube is seen in expected position of distal stomach. Electronically Signed   By: Lupita Raider M.D.   On: 08/26/2022 13:34   MR BRAIN WO CONTRAST  Result Date: 08/26/2022 CLINICAL DATA:  Neuro deficit, acute, stroke suspected neuro change, code stroke at 1830. EXAM: MRI HEAD WITHOUT CONTRAST TECHNIQUE: Multiplanar, multiecho pulse sequences of the brain and surrounding structures were obtained without intravenous contrast. COMPARISON:  Head CT 08/24/2022.  MRI brain 08/20/2022, 08/18/2022. FINDINGS: Brain: Cortical restricted diffusion along the right-greater-than-left superior frontal sulci, as well as in the right parietal lobe and right occipital lobe, with associated edema and partial sulcal effacement, consistent with late acute infarction. Few foci of acute infarction in the left occipital lobe and left  cerebellar hemisphere. No acute hemorrhage or significant mass effect. No hydrocephalus or extra-axial collection. Vascular: Normal flow voids. Skull and upper cervical spine: Normal marrow signal. Sinuses/Orbits: Unremarkable. Other: None. IMPRESSION: 1. 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 2. No acute hemorrhage or significant mass effect. Electronically Signed   By: Orvan Falconer M.D.   On: 08/26/2022 12:53   DG Abd 1 View  Result Date: 08/25/2022 CLINICAL DATA:  [id number] Encounter for imaging study to confirm orogastric (OG) tube placement [id number] EXAM: ABDOMEN - 1 VIEW COMPARISON:  08/22/2022 FINDINGS: Enteric tube terminates within the gastric antrum. There is a small amount of contrast within the gastric fundus. Included bowel gas pattern is nonobstructive. Overlying monitoring leads. IMPRESSION: Enteric tube terminates within the gastric antrum. Electronically Signed   By: Duanne Guess D.O.   On: 08/25/2022 10:54   EEG adult  Result Date: 08/25/2022 Debra Quest, MD     08/25/2022  9:09 AM Patient Name: Debra Sharp MRN: [id number] Epilepsy Attending: Charlsie Sharp Referring Physician/Provider: Caryl Pina, MD Date: 08/25/2022 Duration: 26.05 mins Patient history: 72 y.o. female with left side weakness. EEG to evaluate for seizure. Level of alertness: Awake AEDs during EEG study: None Technical aspects: This EEG study was done with scalp electrodes positioned according to the 10-20 International system of electrode placement. Electrical activity was reviewed with band pass filter of 1-70Hz , sensitivity of 7 uV/mm, display speed of 89mm/sec with a 60Hz  notched filter applied as appropriate. EEG data were recorded continuously and digitally stored.  Video monitoring was available and reviewed as appropriate. Description: No clear posterior dominant rhythm was seen. EEG showed continuous generalized 3 to 6 Hz theta-delta slowing,  at times with triphasic morphology.  Hyperventilation and photic stimulation were not  performed.   ABNORMALITY - Continuous slow, generalized IMPRESSION: This study is suggestive of moderate diffuse encephalopathy, nonspecific etiology but likely related to toxic-metabolic causes. No seizures or definite epileptiform discharges were seen throughout the recording. Debra Sharp   CT HEAD WO CONTRAST ( )  Result Date: 08/24/2022 CLINICAL DATA:  Neuro deficit, acute, stroke suspected New left sided weakness. EXAM: CT HEAD WITHOUT CONTRAST TECHNIQUE: Contiguous axial images were obtained from the base of the skull through the vertex without intravenous contrast. RADIATION DOSE REDUCTION: This exam was performed according to the departmental dose-optimization program which includes automated exposure control, adjustment of the mA and/or kV according to patient size and/or use of iterative reconstruction technique. COMPARISON:  08/18/2022 FINDINGS: Brain: There is questionable loss of gray-white differentiation in the high right parietal lobe on images 20 6-28. Cannot exclude acute infarct. The previously seen areas of abnormal punctate diffusion signal on prior MRI not appreciable by CT. There is atrophy and chronic small vessel disease changes. No hydrocephalus or hemorrhage. Vascular: No hyperdense vessel or unexpected calcification. Skull: No acute calvarial abnormality. Sinuses/Orbits: Mucosal thickening diffusely, most pronounced in the left frontal sinus, stable. No air-fluid levels. Other: None IMPRESSION: Questionable area of loss of gray-white differentiation in the high right parietal lobe concerning for area of acute infarction. This could be further evaluated with MRI. Atrophy, chronic small vessel disease. Chronic sinusitis. Electronically Signed   By: Charlett Nose M.D.   On: 08/24/2022 18:06   DG CHEST PORT 1 VIEW  Result Date: 08/23/2022 CLINICAL DATA:  Central line care. EXAM: PORTABLE CHEST 1  VIEW COMPARISON:  08/22/2022. FINDINGS: The heart is enlarged and the mediastinal contour stable. There is a small left pleural effusion with atelectasis at the left lung base. No pneumothorax. The distal tip of the left internal jugular central venous catheter terminates over the right atrium. An enteric tube courses over the stomach and out of the field of view. An endotracheal tube terminates 5.3 cm above the carina. IMPRESSION: 1. Small left pleural effusion with atelectasis at the left lung base. 2. Left internal jugular central venous catheter terminates over the right atrium. 3. Remaining support apparatus as described above. Electronically Signed   By: Thornell Sartorius M.D.   On: 08/23/2022 03:54   DG Chest Port 1 View  Result Date: 08/22/2022 CLINICAL DATA:  Endotracheal tube. EXAM: PORTABLE CHEST 1 VIEW COMPARISON:  04/24/2022. FINDINGS: The heart size and mediastinal contours are within normal limits. Strandy atelectasis or infiltrate is present at the left lung base. There is a small left pleural effusion. The right lung is clear. No pneumothorax bilaterally. An enteric tube courses over the upper abdomen and out of the field of view. The endotracheal tube terminates 4.8 cm above the carina. IMPRESSION: 1. Small left pleural effusion with atelectasis or infiltrate at the left lung base. 2. Support apparatus as described above. Electronically Signed   By: Thornell Sartorius M.D.   On: 08/22/2022 21:28   CT ABDOMEN PELVIS W CONTRAST  Addendum Date: 08/22/2022   ADDENDUM REPORT: 08/22/2022 16:51 ADDENDUM: Critical Value/emergent results were called by telephone at the time of interpretation on 08/22/2022 at 4:39 pm to provider PAULA GUENTHER , who verbally acknowledged these results. Electronically Signed   By: Genevive Bi M.D.   On: 08/22/2022 16:51   Result Date: 08/22/2022 CLINICAL DATA:  Bowel obstruction. EXAM: CT ABDOMEN AND PELVIS WITH CONTRAST TECHNIQUE: Multidetector CT imaging of the abdomen and  pelvis was performed using the standard protocol  following bolus administration of intravenous contrast. RADIATION DOSE REDUCTION: This exam was performed according to the departmental dose-optimization program which includes automated exposure control, adjustment of the mA and/or kV according to patient size and/or use of iterative reconstruction technique. CONTRAST:  75mL OMNIPAQUE IOHEXOL 350 MG/ML SOLN COMPARISON:  CT 08/19/2022 com plain film 08/22/2018 FINDINGS: Lower chest: Small bilateral pleural effusions. Hepatobiliary: No focal hepatic lesion. Normal gallbladder. No biliary duct dilatation. Common bile duct is normal. Pancreas: Pancreas is normal. No ductal dilatation. No pancreatic inflammation. Spleen: Normal spleen Adrenals/urinary tract: Adrenal glands normal. Severe hydronephrosis of the RIGHT kidney. Severe hydroureter on the RIGHT. Findings not changed from comparison exam. Hydroureter extends in the pelvis. LEFT kidney normal. Stomach/Bowel: NG tube in stomach. This fluid within the distal esophagus. The stomach is nondistended. Proximal small bowel is normal caliber. Again demonstrated marked distension of the colon. There is new intraperitoneal free air noted anterior to the liver (image 12/3). Scattered free air in the falciform ligament (image 14/3). Small amount of free air noted in the ventral peritoneal space of the mid abdomen. Minimal free fluid in the abdomen pelvis. No clear source of bowel perforation of presumably from the colon. Vascular/Lymphatic: Abdominal aorta is normal caliber. No periportal or retroperitoneal adenopathy. No pelvic adenopathy. Reproductive: Calcified enlarged uterine fibroid. Complex mass in the RIGHT adnexa measuring 8.5 by 6.7 cm. Other: No free fluid. Musculoskeletal: No aggressive osseous lesion. IMPRESSION: 1. New intraperitoneal free air. Presumed perforation of the colon. No clear site of perforation identified. Minimal intraperitoneal free fluid. 2.  Fluid within the esophagus presumably related to bowel obstruction. NG tube in stomach. 3. Proximal small bowel is nondistended. 4. Colon is markedly distended with air-fluid levels. 5. Severe hydronephrosis of the RIGHT kidney and RIGHT hydroureter. Potential obstructing RIGHT adnexal mass. Electronically Signed: By: Genevive Bi M.D. On: 08/22/2022 16:29   DG Abd 1 View  Result Date: 08/22/2022 CLINICAL DATA:  Bowel obstruction. EXAM: ABDOMEN - 1 VIEW COMPARISON:  Radiograph yesterday, CT 08/19/2022 FINDINGS: Gaseous bowel distension, colonic distention on recent CT, without significant interval change. Multiple ingested pills in the right abdomen. Calcified uterine fibroid as before. CT is pending at this time. IMPRESSION: Gaseous bowel distension, colonic distention on recent CT, without significant interval change. Electronically Signed   By: Narda Rutherford M.D.   On: 08/22/2022 15:58   DG Abd 1 View  Result Date: 08/22/2022 CLINICAL DATA:  Nasogastric tube placement.  Bowel obstruction. EXAM: ABDOMEN - 1 VIEW COMPARISON:  Earlier today FINDINGS: Tip and side port of the enteric tube below the diaphragm in the stomach. Gaseous bowel distention in the upper abdomen. IMPRESSION: Tip and side port of the enteric tube below the diaphragm in the stomach. Electronically Signed   By: Narda Rutherford M.D.   On: 08/22/2022 15:57   VAS [place] TRANSCRANIAL DOPPLER  Result Date: 08/21/2022  Transcranial Doppler Patient Name:  Debra Sharp  Date of Exam:   08/21/2022 Medical Rec #: [id number]      Accession #:    [id number] Date of Birth: May 24, 1950      Patient Gender: F Patient Age:   33 years Exam Location:  [facility] Procedure:      VAS [place] TRANSCRANIAL DOPPLER Referring Phys: Osvaldo Shipper --------------------------------------------------------------------------------  Indications: Stroke. Comparison Study: No prior studies. Performing Technologist: Jean Rosenthal RDMS, RVT  Examination Guidelines: A  complete evaluation includes B-mode imaging, spectral Doppler, color Doppler, and power Doppler as needed of all accessible portions of each  vessel. Bilateral testing is considered an integral part of a complete examination. Limited examinations for reoccurring indications may be performed as noted.  +----------+---------------+----------+-----------+------------------+ RIGHT TCD Right VM (cm/s)Depth (cm)Pulsatility     Comment       +----------+---------------+----------+-----------+------------------+ MCA             79                    1.17                       +----------+---------------+----------+-----------+------------------+ ACA             -36                   1.13                       +----------+---------------+----------+-----------+------------------+ Term ICA        53                    1.11                       +----------+---------------+----------+-----------+------------------+ PCA P1          -15                   0.46                       +----------+---------------+----------+-----------+------------------+ Opthalmic       26                    1.81                       +----------+---------------+----------+-----------+------------------+ ICA siphon      63                    1.30                       +----------+---------------+----------+-----------+------------------+ Vertebral                                     Bidirectional flow +----------+---------------+----------+-----------+------------------+  +----------+--------------+----------+-----------+------------------+ LEFT TCD  Left VM (cm/s)Depth (cm)Pulsatility     Comment       +----------+--------------+----------+-----------+------------------+ MCA            101                   1.21                       +----------+--------------+----------+-----------+------------------+ ACA                                          Unable to insonate  +----------+--------------+----------+-----------+------------------+ Term ICA        26                   1.45                       +----------+--------------+----------+-----------+------------------+ PCA P1          33  1.04                       +----------+--------------+----------+-----------+------------------+ Opthalmic       23                   1.92                       +----------+--------------+----------+-----------+------------------+ ICA siphon      45                   1.34                       +----------+--------------+----------+-----------+------------------+ Vertebral      -32                   1.29                       +----------+--------------+----------+-----------+------------------+  +------------+-------+-------+             VM cm/sComment +------------+-------+-------+ Prox Basilar  -43          +------------+-------+-------+ Dist Basilar  -11          +------------+-------+-------+ Summary:  Elevated left middle cerebral artery mean flow velocities suggest mild stenosis.Mildy elevated right middle cerebral artery mean flow velcoities of unclear significance *See table(s) above for TCD measurements and observations.  Diagnosing physician: Delia Heady MD Electronically signed by Delia Heady MD on 08/21/2022 at 8:01:56 PM.    Final    VAS US CAROTID  Result Date: 08/21/2022 Carotid Arterial Duplex Study Patient Name:  Debra Sharp  Date of Exam:   08/21/2022 Medical Rec #: [id number]      Accession #:    [id number] Date of Birth: 01-24-1951      Patient Gender: F Patient Age:   31 years Exam Location:  [facility] Procedure:      VAS US CAROTID Referring Phys: Osvaldo Shipper --------------------------------------------------------------------------------  Indications:       CVA. Risk Factors:      Hypertension, hyperlipidemia. Comparison Study:  No prior studies. Performing Technologist: Jean Rosenthal RDMS, RVT   Examination Guidelines: A complete evaluation includes B-mode imaging, spectral Doppler, color Doppler, and power Doppler as needed of all accessible portions of each vessel. Bilateral testing is considered an integral part of a complete examination. Limited examinations for reoccurring indications may be performed as noted.  Right Carotid Findings: +----------+--------+--------+--------+------------------+---------+           PSV cm/sEDV cm/sStenosisPlaque DescriptionComments  +----------+--------+--------+--------+------------------+---------+ CCA Prox  185     18                                Turbulent +----------+--------+--------+--------+------------------+---------+ CCA Distal129     22                                          +----------+--------+--------+--------+------------------+---------+ ICA Prox  147     31      1-39%   calcific          tortuous  +----------+--------+--------+--------+------------------+---------+ ICA Distal118     27                                          +----------+--------+--------+--------+------------------+---------+  ECA       111     16                                          +----------+--------+--------+--------+------------------+---------+ +----------+--------+-------+--------+-------------------+           PSV cm/sEDV cmsDescribeArm Pressure (mmHG) +----------+--------+-------+--------+-------------------+ [id number]            Stenotic                    +----------+--------+-------+--------+-------------------+ +---------+--------+--+--------+---------------+ VertebralPSV cm/s21EDV cm/sBi- directional +---------+--------+--+--------+---------------+  Left Carotid Findings: +----------+--------+--------+--------+------------------+--------+           PSV cm/sEDV cm/sStenosisPlaque DescriptionComments +----------+--------+--------+--------+------------------+--------+ CCA Prox  67      16                                          +----------+--------+--------+--------+------------------+--------+ CCA Distal77      17                                         +----------+--------+--------+--------+------------------+--------+ ICA Prox  89      25      1-39%   calcific                   +----------+--------+--------+--------+------------------+--------+ ICA Distal95      31                                         +----------+--------+--------+--------+------------------+--------+ ECA       80                                                 +----------+--------+--------+--------+------------------+--------+ +----------+--------+--------+----------------+-------------------+           PSV cm/sEDV cm/sDescribe        Arm Pressure (mmHG) +----------+--------+--------+----------------+-------------------+ [id number]              Multiphasic, WNL                    +----------+--------+--------+----------------+-------------------+ +---------+--------+--+--------+--+---------+ VertebralPSV cm/s74EDV cm/s14Antegrade +---------+--------+--+--------+--+---------+   Summary: Right Carotid: Velocities in the right ICA are consistent with a 1-39% stenosis. Left Carotid: Velocities in the left ICA are consistent with a 1-39% stenosis. Vertebrals:  Left vertebral artery demonstrates antegrade flow. Right vertebral              artery demonstrates bidirectional flow. Subclavians: Right subclavian artery was stenotic. Normal flow hemodynamics were              seen in the left subclavian artery. *See table(s) above for measurements and observations.  Electronically signed by Delia Heady MD on 08/21/2022 at 7:57:56 PM.    Final    DG Abd Portable 1V  Result Date: 08/21/2022 CLINICAL DATA:  130865 Nausea and vomiting [id number] EXAM: PORTABLE ABDOMEN - 1 VIEW COMPARISON:  CT 08/19/2022 FINDINGS: Persistent dilated loops of colon. A few gas distended small bowel loops in the left  mid abdomen. Calcified uterine  fibroid. IMPRESSION: Persistent dilated colon suggesting distal obstruction. Electronically Signed   By: Corlis Leak M.D.   On: 08/21/2022 12:51   MR BRAIN W CONTRAST  Result Date: 08/20/2022 CLINICAL DATA:  Evaluate for metastatic disease. EXAM: MRI HEAD WITH CONTRAST TECHNIQUE: Multiplanar, multiecho pulse sequences of the brain and surrounding structures were obtained with intravenous contrast. CONTRAST:  7mL GADAVIST GADOBUTROL 1 MMOL/ML IV SOLN COMPARISON:  Noncontrast brain MRI from 2 days ago FINDINGS: Brain: Punctate enhancement in the right frontal parietal white matter on 5:38, associated with a focus of restricted diffusion. No generalized abnormal enhancement. Vascular: Major vessels are enhancing Skull and upper cervical spine: No focal marrow lesion. Sinuses/Orbits: Active bilateral sinusitis with maxillary fluid levels. Complete left frontal sinus opacification. Enhancing polypoid densities in the bilateral nasal cavity which are numerous. IMPRESSION: 1. Single punctate enhancement in the right frontal parietal white matter, a site of weakly restricted diffusion and compatible with subacute ischemia. If high concern for metastatic disease follow-up in 12 weeks could prove resolution. 2. Active sinusitis with nasal cavity polyps. Electronically Signed   By: Tiburcio Pea M.D.   On: 08/20/2022 12:01   CT ABDOMEN PELVIS W CONTRAST  Result Date: 08/19/2022 CLINICAL DATA:  Hydronephrosis, abnormal gallbladder on ultrasound abdomen distension EXAM: CT ABDOMEN AND PELVIS WITH CONTRAST TECHNIQUE: Multidetector CT imaging of the abdomen and pelvis was performed using the standard protocol following bolus administration of intravenous contrast. RADIATION DOSE REDUCTION: This exam was performed according to the departmental dose-optimization program which includes automated exposure control, adjustment of the mA and/or kV according to patient size and/or use of iterative  reconstruction technique. CONTRAST:  75mL OMNIPAQUE IOHEXOL 350 MG/ML SOLN COMPARISON:  Ultrasound 08/18/2022 FINDINGS: Lower chest: Lung bases demonstrate no acute airspace disease. Mild coronary vascular calcification. Borderline cardiomegaly. Small hiatal hernia Hepatobiliary: No calcified gallstone or biliary dilatation. No gas within the gallbladder lumen or gallbladder wall. Pancreas: Unremarkable. No pancreatic ductal dilatation or surrounding inflammatory changes. Spleen: Normal in size without focal abnormality. Adrenals/Urinary Tract: Adrenal glands are within normal limits. Severe right-sided hydronephrosis and hydroureter with dilated ureter visualized to the pelvis. No obstructing stone is seen. No significant excretion of contrast right kidney consistent with decreased renal function and obstruction. Decompressed urinary bladder Stomach/Bowel: Stomach nonenlarged. No dilated small bowel. Fluid and air distension of the colon, measuring up to 9.3 cm maximum at the right colon. Decompressed rectum with small volume stool. Unopacified bowel in the pelvis. Poorly identified sigmoid colon with suspicion of possible sigmoid colon mass in the right pelvis, series 3, image 70 but limited due to absence of enteral contrast. Vascular/Lymphatic: Moderate aortic atherosclerosis. No aneurysm. No suspicious lymph nodes. Reproductive: Enlarged uterus with large calcified uterine mass measuring 9 by 8.7 cm consistent with large calcified fibroid. As mentioned previously, ill-defined hypodense area in the right hemipelvis. Other: Negative for free air.  Trace volume fluid in the pelvis Musculoskeletal: Grade 1 anterolisthesis L4 on L5. No acute osseous abnormality IMPRESSION: 1. Marked fluid and air distension of the majority of colon with decompressed rectum and poorly delineated sigmoid colon. Ill-defined hypodensity/suspected mass within the right pelvis with central gas collection. Findings are suspect for colon  obstruction, potentially due to sigmoid colon mass, suggest correlation with colonoscopy. There is additional finding of severe right-sided hydronephrosis and hydroureter with dilated ureter visualized to the level of right pelvic ill-defined hypodensity, consistent with distal ureteral obstruction by right pelvic mass. Other consideration for the right pelvic mass could include gynecologic malignancy.  Trace free fluid in the pelvis. No free air. 2. Negative for gallstones or air within the gallbladder as questioned on prior ultrasound 3. Enlarged uterus with large calcified uterine mass consistent with large calcified fibroid. 4. Small hiatal hernia. 5. Aortic atherosclerosis. Aortic Atherosclerosis (ICD10-I70.0). These results will be called to the ordering clinician or representative by the Radiologist Assistant, and communication documented in the PACS or [employer]. Electronically Signed   By: Jasmine Pang M.D.   On: 08/19/2022 17:50   ECHOCARDIOGRAM LIMITED  Result Date: 08/19/2022    ECHOCARDIOGRAM LIMITED REPORT   Patient Name:   Debra Sharp Date of Exam: 08/19/2022 Medical Rec #:  [id number]     Height:       66.0 in Accession #:    [id number]    Weight:       170.0 lb Date of Birth:  12-22-1950     BSA:          1.866 m Patient Age:    72 years      BP:           133/68 mmHg Patient Gender: F             HR:           80 bpm. Exam Location:  Inpatient Procedure: Limited Echo and Color Doppler Indications:    stroke/pericardial effusion  History:        Patient has prior history of Echocardiogram examinations, most                 recent 04/25/2022. CHF, COPD; Risk Factors:Hypertension and Former                 Smoker.  Sonographer:    Melissa Morford RDCS (AE, PE) Referring Phys: [id number] Osvaldo Shipper IMPRESSIONS  1. Left ventricular ejection fraction, by estimation, is 40 to 45%. The left ventricle has mildly decreased function. The left ventricle demonstrates global hypokinesis.  2. Right  ventricular systolic function is normal. The right ventricular size is normal.  3. The mitral valve is normal in structure. Mild to moderate mitral valve regurgitation.  4. The aortic valve is tricuspid. Aortic valve regurgitation is not visualized.  5. Trivial pericardial effusion  6. The inferior vena cava is normal in size with greater than 50% respiratory variability, suggesting right atrial pressure of 3 mmHg. FINDINGS  Left Ventricle: Left ventricular ejection fraction, by estimation, is 40 to 45%. The left ventricle has mildly decreased function. The left ventricle demonstrates global hypokinesis. Right Ventricle: The right ventricular size is normal. No increase in right ventricular wall thickness. Right ventricular systolic function is normal. Pericardium: Trivial pericardial effusion is present. Mitral Valve: The mitral valve is normal in structure. Mild to moderate mitral valve regurgitation. Tricuspid Valve: The tricuspid valve is normal in structure. Tricuspid valve regurgitation is trivial. Aortic Valve: The aortic valve is tricuspid. Aortic valve regurgitation is not visualized. Pulmonic Valve: The pulmonic valve was not well visualized. Pulmonic valve regurgitation is not visualized. Venous: The inferior vena cava is normal in size with greater than 50% respiratory variability, suggesting right atrial pressure of 3 mmHg. IAS/Shunts: The interatrial septum was not well visualized. Epifanio Lesches MD Electronically signed by Epifanio Lesches MD Signature Date/Time: 08/19/2022/1:35:21 PM    Final    MR BRAIN WO CONTRAST  Result Date: 08/18/2022 CLINICAL DATA:  Follow-up examination for stroke. EXAM: MRI HEAD WITHOUT CONTRAST TECHNIQUE: Multiplanar, multiecho pulse sequences of the brain and surrounding structures were obtained  without intravenous contrast. COMPARISON:  CT from earlier the same day. FINDINGS: Brain: Cerebral volume within normal limits. Scattered patchy T2/FLAIR hyperintensity  involving the periventricular deep white matter both cerebral hemispheres, consistent with chronic small vessel ischemic disease, mild-to-moderate in nature. Few scattered punctate foci of diffusion signal abnormality are seen involving the right parietal lobe (series 5, image 87), left frontal centrum semi ovale (series 7, image 64) and right cerebellum (series 5, image 64). Findings suspicious for tiny acute ischemic infarcts. No associated hemorrhage or mass effect. No other evidence for acute or subacute ischemia. Gray-white matter differentiation otherwise maintained. No acute or chronic intracranial blood products. No mass lesion, midline shift or mass effect. No hydrocephalus or extra-axial fluid collection. Pituitary gland and suprasellar region within normal limits. Vascular: Major intracranial vascular flow voids are maintained. Skull and upper cervical spine: Craniocervical junction within normal limits. Diffuse loss of normal bone marrow signal, nonspecific but can be seen with anemia, smoking, obesity, and infiltrative/myelofibrotic marrow processes. No focal marrow replacing lesion. Sinuses/Orbits: Prior bilateral ocular lens replacement. Scattered mucosal thickening and secretions noted about the frontoethmoidal, sphenoid, and maxillary sinuses. Scattered superimposed air-fluid levels. No mastoid effusion. Other: None. IMPRESSION: 1. Few scattered punctate foci of diffusion signal abnormality involving the right parietal lobe, left frontal centrum semi ovale, and right cerebellum, suspicious for tiny acute ischemic infarcts. No associated hemorrhage or mass effect. 2. Underlying mild to moderate chronic microvascular ischemic disease. Electronically Signed   By: Rise Mu M.D.   On: 08/18/2022 20:24   US Abdomen Limited RUQ (LIVER/GB)  Result Date: 08/18/2022 CLINICAL DATA:  Cirrhosis evaluation EXAM: ULTRASOUND ABDOMEN LIMITED RIGHT UPPER QUADRANT COMPARISON:  None Available. FINDINGS:  Gallbladder: Probable small amount of gallbladder sludge. Few echogenic foci with possible dirty shadowing. Negative sonographic Murphy. Common bile duct: Diameter: 3.4 mm Liver: Slightly coarse hepatic echotexture. No focal hepatic abnormality. Portal vein is patent on color Doppler imaging with normal direction of blood flow towards the liver. Other: Incidentally noted is moderate severe right hydronephrosis. Cortical thinning of right kidney IMPRESSION: 1. Slightly coarse hepatic echotexture without focal lesion. Possible subtle surface nodularity on some of the views as may be seen with early changes of cirrhosis. 2. Distended gallbladder, appears to contain sludge and echogenic foci some of which exhibit dirty shadowing, question air containing stones. No wall thickening or Murphy. Suggest correlation with CT to exclude air within the gallbladder. 3. Moderate to severe right hydronephrosis with cortical thinning of right kidney. This may also be assessed at CT follow-up. These results will be called to the ordering clinician or representative by the Radiologist Assistant, and communication documented in the PACS or [employer]. Electronically Signed   By: Jasmine Pang M.D.   On: 08/18/2022 19:42   CT Head Wo Contrast  Result Date: 08/18/2022 CLINICAL DATA:  Weakness since March, worsening over the last week. Decreased mobility. EXAM: CT HEAD WITHOUT CONTRAST TECHNIQUE: Contiguous axial images were obtained from the base of the skull through the vertex without intravenous contrast. RADIATION DOSE REDUCTION: This exam was performed according to the departmental dose-optimization program which includes automated exposure control, adjustment of the mA and/or kV according to patient size and/or use of iterative reconstruction technique. COMPARISON:  None Available. FINDINGS: Brain: There is no acute intracranial hemorrhage, extra-axial fluid collection, or acute territorial infarct. Parenchymal volume is  normal for age. The ventricles are normal in size. Gray-white differentiation is preserved. There is mild background chronic small-vessel ischemic change. 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 The pituitary and suprasellar region are normal. There is no mass lesion. There is no mass effect or midline shift. Vascular: No hyperdense vessel or unexpected calcification. Skull: Normal. Negative for fracture or focal lesion. Sinuses/Orbits: There is extensive chronic pansinusitis with layering fluid in the maxillary and sphenoid sinuses. Bilateral lens implants are in place. The globes and orbits are otherwise unremarkable. Other: The mastoid air cells and middle ear cavities are clear. IMPRESSION: 1. 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 No other evidence of acute intracranial pathology. 2. Extensive chronic pansinusitis with possible superimposed acute sinusitis. Electronically Signed   By: Lesia Hausen M.D.   On: 08/18/2022 12:12    Labs:  CBC: Recent Labs    08/28/22 0753 08/29/22 0436 08/30/22 0123 08/31/22 0150  WBC 19.9* 21.2* 24.5* 20.3*  HGB 11.9* 9.9* 9.2* 9.4*  HCT 38.8 32.4* 31.1* 31.3*  PLT 245 320 467* 655*    COAGS: Recent Labs    08/21/22 0259  INR 1.1    BMP: Recent Labs    08/27/22 0741 08/28/22 0753 08/30/22 0123 08/31/22 0150  NA 138 139 137 133*  K 4.1 4.0 4.0 4.1  CL 109 110 106 108  CO2 22 20* 21* 17*  GLUCOSE 142* 101* 127* 130*  BUN 34* 34* 30* 24*  CALCIUM 8.0* 7.9* 7.5* 7.5*  CREATININE 0.99 0.94 0.79 0.71  GFRNONAA >60 >60 >60 >60    LIVER FUNCTION TESTS: Recent Labs    08/20/22 0321 08/22/22 2052 08/23/22 0922 08/31/22 0150  BILITOT 0.6 1.9* 1.4* 0.5  AST 9* 9* 14* 17  ALT 9 8 7 12   ALKPHOS 75 44 35* 91  PROT 6.0* 4.6* 4.6* 4.7*  ALBUMIN 2.6* 2.7* 2.4* 1.5*    TUMOR MARKERS: No results for input(s): "AFPTM", "CEA", "CA199",  "CHROMGRNA" in the last 8760 hours.  Assessment and Plan:  Small bowel cancer with right hydronephrosis; post-op intra-abdominal fluid collections: Debra Sharp, 72 year old female, presents today for an image-guided right percutaneous nephrostomy tube. The intra-abdominal fluid collections are small and look like post-op blood. IR will hold off on placing a drain. If future imaging shows enlarging fluid collections IR will reevaluate. The surgery team has been made aware of this decision.   I spoke with the patient's son Debra Maduro over the phone to discuss the procedure. Telephone consent was obtained. I also spoke with the patient and she is in agreement to proceed and gave verbal consent. She demonstrated a good understanding of her overall clinical picture and the reason for needing a nephrostomy tube.   Risks and benefits of right PCN placement was discussed with the patient including, but not limited to, infection, bleeding, significant bleeding causing loss or decrease in renal function or damage to adjacent structures.   All of the patient's questions were answered, patient is agreeable to proceed. Tube feeds were stopped at 1030 this morning. IV heparin has been held.   Consent signed and in chart.  Thank you for this interesting consult.  I greatly enjoyed meeting Debra Sharp and look forward to participating in their care.  A copy of this report was sent to the requesting provider on this date.  Electronically Signed: Alwyn Ren, AGACNP-BC [phone] 08/31/2022, 3:11 PM   I spent a total of 20 Minutes    in face to face in clinical consultation, greater than 50% of which was counseling/coordinating care for right PCN.

## 2022-09-01 ENCOUNTER — Other Ambulatory Visit: Payer: Self-pay

## 2022-09-01 ENCOUNTER — Inpatient Hospital Stay (HOSPITAL_COMMUNITY): Payer: Medicare Other

## 2022-09-01 DIAGNOSIS — K922 Gastrointestinal hemorrhage, unspecified: Secondary | ICD-10-CM | POA: Diagnosis not present

## 2022-09-01 DIAGNOSIS — R609 Edema, unspecified: Secondary | ICD-10-CM | POA: Diagnosis not present

## 2022-09-01 LAB — GLUCOSE, CAPILLARY
Glucose-Capillary: 78 mg/dL (ref 70–99)
Glucose-Capillary: 71 mg/dL (ref 70–99)
Glucose-Capillary: 69 mg/dL — ABNORMAL LOW (ref 70–99)
Glucose-Capillary: 85 mg/dL (ref 70–99)
Glucose-Capillary: 87 mg/dL (ref 70–99)
Glucose-Capillary: 95 mg/dL (ref 70–99)

## 2022-09-01 LAB — BASIC METABOLIC PANEL
Anion gap: 8 (ref 5–15)
BUN: 20 mg/dL (ref 8–23)
CO2: 17 mmol/L — ABNORMAL LOW (ref 22–32)
Calcium: 8 mg/dL — ABNORMAL LOW (ref 8.9–10.3)
Chloride: 111 mmol/L (ref 98–111)
Creatinine, Ser: 0.66 mg/dL (ref 0.44–1.00)
GFR, Estimated: >60 mL/min (ref >60)
Glucose, Bld: 92 mg/dL (ref 70–99)
Potassium: 4 mmol/L (ref 3.5–5.1)
Sodium: 136 mmol/L (ref 135–145)

## 2022-09-01 LAB — CBC
HCT: 32.2 % — ABNORMAL LOW (ref 36.0–46.0)
Hemoglobin: 9.7 g/dL — ABNORMAL LOW (ref 12.0–15.0)
MCH: 24.6 pg — ABNORMAL LOW (ref 26.0–34.0)
MCHC: 30.1 g/dL (ref 30.0–36.0)
MCV: 81.5 fL (ref 80.0–100.0)
Platelets: 932 10*3/uL (ref 150–400)
RBC: 3.95 MIL/uL (ref 3.87–5.11)
RDW: 27.6 % — ABNORMAL HIGH (ref 11.5–15.5)
WBC: 18.6 10*3/uL — ABNORMAL HIGH (ref 4.0–10.5)
nRBC: 0 % (ref 0.0–0.2)

## 2022-09-01 LAB — TYPE AND SCREEN
ABO/RH(D): A POS
Antibody Screen: NEGATIVE

## 2022-09-01 LAB — HEPARIN LEVEL (UNFRACTIONATED)
Heparin Unfractionated: <0.1 IU/mL — ABNORMAL LOW (ref 0.30–0.70)
Heparin Unfractionated: <0.1 IU/mL — ABNORMAL LOW (ref 0.30–0.70)
Heparin Unfractionated: <0.1 IU/mL — ABNORMAL LOW (ref 0.30–0.70)

## 2022-09-01 LAB — PHOSPHORUS: Phosphorus: 3.8 mg/dL (ref 2.5–4.6)

## 2022-09-01 MED ORDER — CHLORHEXIDINE GLUCONATE CLOTH 2 % EX PADS
6.0000 | MEDICATED_PAD | Freq: Every day | CUTANEOUS | Status: DC
  Administered 2022-09-01 – 2022-09-16 (×16): 6 via TOPICAL

## 2022-09-01 MED ORDER — DEXTROSE 5 % IV SOLN: INTRAVENOUS | Status: AC

## 2022-09-01 MED ORDER — MELATONIN 3 MG PO TABS
3.0000 mg | ORAL_TABLET | Freq: Every day | ORAL | Status: DC
  Administered 2022-09-01 – 2022-09-15 (×11): 3 mg via ORAL
  Filled 2022-09-01 (×12): qty 1

## 2022-09-01 MED ORDER — SODIUM CHLORIDE 0.9% FLUSH: 10.0000 mL | INTRAVENOUS | Status: DC | PRN

## 2022-09-01 MED ORDER — SODIUM CHLORIDE 0.9% FLUSH
10.0000 mL | Freq: Two times a day (BID) | INTRAVENOUS | Status: DC
  Administered 2022-09-01 – 2022-09-09 (×13): 10 mL
  Administered 2022-09-10: 40 mL
  Administered 2022-09-10 – 2022-09-12 (×3): 10 mL
  Administered 2022-09-12: 20 mL
  Administered 2022-09-13 – 2022-09-16 (×6): 10 mL

## 2022-09-01 MED ORDER — DEXTROSE 50 % IV SOLN
INTRAVENOUS | Status: AC
  Administered 2022-09-01: 25 mL
  Filled 2022-09-01: qty 50

## 2022-09-01 NOTE — Progress Notes (Signed)
Peripherally Inserted Central Catheter Placement  The IV Nurse has discussed with the patient and/or persons authorized to consent for the patient, the purpose of this procedure and the potential benefits and risks involved with this procedure.  The benefits include less needle sticks, lab draws from the catheter, and the patient may be discharged home with the catheter. Risks include, but not limited to, infection, bleeding, blood clot (thrombus formation), and puncture of an artery; nerve damage and irregular heartbeat and possibility to perform a PICC exchange if needed/ordered by physician.  Alternatives to this procedure were also discussed.  [employer] PICC patient education guide, fact sheet on infection prevention and patient information card has been provided to patient /or left at bedside. PICC placed by Elenore Paddy RN    PICC Placement Documentation  PICC Double Lumen 09/01/22 Right Basilic 39 cm 1 cm (Active)  Indication for Insertion or Continuance of Line Limited venous access - need for IV therapy >5 days (PICC only) 09/01/22 1924  Exposed Catheter (cm) 1 cm 09/01/22 1924  Site Assessment Clean, Dry, Intact 09/01/22 1924  Lumen #1 Status Blood return noted;Flushed;Saline locked 09/01/22 1924  Lumen #2 Status Blood return noted;Flushed;Saline locked 09/01/22 1924  Dressing Type Transparent;Securing device 09/01/22 1924  Dressing Status Clean, Dry, Intact;Antimicrobial disc in place 09/01/22 1924  Safety Lock Not Applicable 09/01/22 1924  Line Care Connections checked and tightened 09/01/22 1924  Line Adjustment (NICU/IV Team Only) No 09/01/22 1924  Dressing Intervention New dressing 09/01/22 1924  Dressing Change Due 09/08/22 09/01/22 1924       Christeen Douglas 09/01/2022, 7:54 PM

## 2022-09-01 NOTE — Progress Notes (Signed)
Patient projectile omitted 800 cc of green appearing vomits, it might have been close to 1000 cc as some of it got in bed. Patient did vomit during the day too per day shift report.  I had stop tube feeding when patient started vomiting. Pt stated its easy emesis and she is not nauseated. Pt has been very difficult and confused tonight. Pt thought she was in [employer] parking lot, she constantly wanted to go home. She refused to go to CT of head, we tried to make her understanding, but she refused it and said she is an adult and can make her own decision. Dr. Arlean Hopping paged and notified. HE told me to hold the tube feeding for now. Plan of care continues.

## 2022-09-01 NOTE — Progress Notes (Signed)
PROGRESS NOTE  Debra Sharp  MWU:[id number] DOB: 10-15-1950 DOA: 08/18/2022 PCP: Clayborn Heron, MD   Brief Narrative:  Patient is a 72 year old female with past medical history of hypertension, hyperlipidemia, systolic CHF, thrombocytopenia, COPD, asthma who initially presented with weakness, abdominal pain.  Workup showed anemia concerning for upper GI bleed.  CT abdomen/pelvis showed colonic distention, possible obstruction.  GI consulted, plan was for EGD but she could not tolerate prep.  Also found to have right-sided hydronephrosis, incidental acute stroke on CT/MRI.  Repeat CT abdomen/pelvis on 7/6 showed new intraperitoneal free air with presumed perforation of the colon.  Surgery consulted and she underwent emergent exploratory laparotomy, right hemicolectomy, ileostomy with fistula, biopsy of pelvic mass.  Patient was left intubated and was transferred to ICU for pain management.  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  Palliative  care consulted.  General surgery following.  Currently on tube feeding.  Hospital course also remarkable for finding of acute PE now on heparin drip.  Now extubated and transferred to [facility] service on 7/12.  Plan was for TEE which has been canceled because patient declined.  Palliative care, urology ,surgery ,neurology were following.  PT/OT recommending SNF on discharge.  Assessment & Plan:  Principal Problem:   GI bleed Active Problems:   Symptomatic anemia   Thrombocytosis   Iron deficiency anemia   Abnormal CT of the abdomen   Colon distention   History of cardioembolic cerebrovascular accident (CVA)   Silent cerebral infarction (HCC)   Bowel perforation (HCC)   Ventilator dependent (HCC)   Pelvic mass   Change in bowel habits   Abnormal CT scan, gastrointestinal tract   Arterial hypotension   Septic shock (HCC)   Fecal occult blood test positive   Uterine leiomyoma   Hydroureteronephrosis   Sepsis with acute hypoxic  respiratory failure and septic shock (HCC)   Encephalopathy acute   Perforation bowel (HCC)   Malnutrition of moderate degree   Cerebrovascular accident (CVA) due to embolism of precerebral artery (HCC)   Acute metabolic encephalopathy   Delirium  Septic shock secondary to acute peritonitis due to colonic perforation due to obstructing pelvic mass: Status post exploratory laparotomy with right hemicolectomy, end ileostomy creation.  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  Currently in broad spectrum antibiotics with micafungin, Flagyl, cefepime.  Wound  cultures NGTD.  On tube feed.Speech therapy consulted to advance the diet by mouth  Currently hemodynamically stable.  General surgery following.  Leukocytosis persist.  She is afebrile.  Denies abdominal pain, nausea or vomiting this mrng.CT abdomen/pelvis for follow-up for leukocytosis was done on 7/14 showed possibility of new abscesses, postoperative ileus, severe right hydroureteronephrosis.  IR declined to drain for new finding of abscess saying that they are too small and may not be abscess at all.  Pelvic mass: Culprit for colonic perforation.  Initially concern for gynecological malignancy with elevated CA125, CA 19-19 but pathology showed small bowel primary.   General surgery recommending arranging outpatient follow-up with oncology before discharge, no need for immediate consultation.  I have sent message to Dr. Leonides Schanz regarding follow-up appointment before discharge and he will make arrangements for outpatient follow-up. Follow up CT showed large complex mass occupying the majority of the low right pelvis grossly unchanged.  Acute metabolic encephalopathy: Likely from sepsis, delirium, CVAs.  This morning she remains alert oriented.  Confusion is intermittent.  We are checking CT head.  Acute CVA: MRI done on 7/4 showed single punctuate enhancement  in the right frontoparietal area, no evidence of mets.  EEG done  on 7/9 did not show any seizure.  Suspected to be embolic CVA versus secondary to hypoperfusion in the setting of anemia/septic shock.  Neurology were following.  There was plan for TEE but patient has declined. .  Continue statin.  PT/OT/SLP following.  Recommended SNF on discharge She has left-sided weakness. Hardly  able to get up from the bed.  Currently on heparin drip, will change to Eliquis  when she does not need any more surgery  Acute PE: CT angiogram showed acute PE.  Currently on heparin drip.  No evidence of right heart strain.  Will start her on Eliquis when surgery clears  AKI/hyperkalemia/hypophosphatemia:   Currently kidney function is stable.Phosphorus being supplemented  Hydronephrosis secondary to abdominal mass: Urology following.  Follow-up CT showed persistent severe right hydroureteronephrosis.  IR did  PCNL placement on 7/15 but she pulled out last night  Acute on chronic renal disease anemia/acute blood loss anemia secondary to surgery/chronic GI bleed due to small bowel adenocarcinoma: Monitor hemoglobin.  Transfuse if less than 7.  Currently on PPI.  Currently hemoglobin is stable   Hypertension: Off hypertensives.  Monitor blood pressure  Hyperlipidemia: Currently on Lipitor  Chronic systolic CHF: Echo on 08/18/2020 showed EF of 40 to 45%, global hypokinesis, normal right ventricular function.  Previous echo on March 2024 showed EF of 30 to 35%.  We recommend follow-up with cardiology as an outpatient  Hyperglycemia: Currently on sliding scale insulin.  Monitor blood sugars  Left upper extremity edema: This is most likely from IV line.  Will check venous Doppler to rule out DVT  Goals of care: Multiple comorbidities.  General surgery recommended palliative care consulted for new finding of malignancy, and incomplete restriction.  Palliative care were following.  Remains full code.  I again discussed the goals of care with the son.  He understands her prognosis but  continues to request for aggressive treatment for now     Nutrition Problem: Moderate Malnutrition Etiology: chronic illness    DVT prophylaxis:SCDs Start: 08/18/22 1710     Code Status: Full Code  Family Communication: Discussed with son on  on 7/16  Patient status:Inpatient  Patient is from :home  Anticipated discharge to:SnF  Estimated DC date:after general surgery clearance   Consultants: General surgery, PCCM, palliative care, neurology  Procedures: Exploratory laparotomy  Antimicrobials:  Anti-infectives (From admission, onward)    Start     Dose/Rate Route Frequency Ordered Stop   08/30/22 0830  ceFEPIme (MAXIPIME) 2 g in sodium chloride 0.9 % 100 mL IVPB        2 g 200 mL/hr over 30 Minutes Intravenous Every 8 hours 08/30/22 0826     08/23/22 2100  ceFEPIme (MAXIPIME) 2 g in sodium chloride 0.9 % 100 mL IVPB  Status:  Discontinued        2 g 200 mL/hr over 30 Minutes Intravenous Every 12 hours 08/23/22 1357 08/30/22 0826   08/23/22 0400  micafungin (MYCAMINE) 100 mg in sodium chloride 0.9 % 100 mL IVPB        100 mg 105 mL/hr over 1 Hours Intravenous Daily 08/23/22 0242     08/22/22 1800  metroNIDAZOLE (FLAGYL) IVPB 500 mg        500 mg 100 mL/hr over 60 Minutes Intravenous Every 12 hours 08/22/22 1711     08/22/22 1800  ceFEPIme (MAXIPIME) 2 g in sodium chloride 0.9 % 100 mL IVPB  Status:  Discontinued        2 g 200 mL/hr over 30 Minutes Intravenous Every 8 hours 08/22/22 1716 08/23/22 1357   08/22/22 1724  metroNIDAZOLE (FLAGYL) 500 MG/100ML IVPB       Note to Pharmacy: [name]: cabinet override      08/22/22 1724 08/23/22 0845   08/19/22 1030  cefTRIAXone (ROCEPHIN) 1 g in sodium chloride 0.9 % 100 mL IVPB  Status:  Discontinued        1 g 200 mL/hr over 30 Minutes Intravenous Every 24 hours 08/19/22 0937 08/22/22 1715       Subjective:  Patient seen and examined at the bedside today.  She was being transported to CT.  During my  evaluation, CXR looked very comfortable, not in any Distress.  She denied any abdomen pain, nausea or vomiting.  She was alert and oriented.  Knew current day and month  Objective: Vitals:   09/01/22 0749 09/01/22 0752 09/01/22 0754 09/01/22 0800  BP:    (!) 134/59  Pulse: 91   94  Resp: 17   16  Temp:    97.9 F (36.6 C)  TempSrc:    Oral  SpO2: 100% 100% 100% 100%  Weight:      Height:        Intake/Output Summary (Last 24 hours) at 09/01/2022 1130 Last data filed at 09/01/2022 0850 Gross per 24 hour  Intake 340 ml  Output 3555 ml  Net -3215 ml   Filed Weights   08/30/22 0348 08/31/22 0443 09/01/22 0400  Weight: 80 kg 81.7 kg 77.2 kg    Examination:  General exam: Overall comfortable, not in distress, awake, lying in bed HEENT: PERRL, feeding tube Respiratory system:  no wheezes or crackles  Cardiovascular system: S1 & S2 heard, RRR.  Gastrointestinal system: Abdomen is nondistended, soft .  Ostomy pouch with black/brown stool, left-sided drain Central nervous system: Alert and oriented, obeys commands, left-sided weakness Extremities: Bilateral lower extremity pitting edema, no clubbing ,no cyanosis, edema of left upper extremity Skin: No rashes, no ulcers,no icterus     Data Reviewed: I have personally reviewed following labs and imaging studies  CBC: Recent Labs  Lab 08/28/22 0753 08/29/22 0436 08/30/22 0123 08/31/22 0150 09/01/22 0319  WBC 19.9* 21.2* 24.5* 20.3* 18.6*  HGB 11.9* 9.9* 9.2* 9.4* 9.7*  HCT 38.8 32.4* 31.1* 31.3* 32.2*  MCV 83.8 80.6 82.3 83.0 81.5  PLT 245 320 467* 655* 932*   Basic Metabolic Panel: Recent Labs  Lab 08/26/22 1659 08/27/22 0741 08/27/22 1712 08/28/22 0753 08/29/22 0436 08/30/22 0123 08/31/22 0150 09/01/22 0319  NA  --  138  --  139  --  137 133* 136  K  --  4.1  --  4.0  --  4.0 4.1 4.0  CL  --  109  --  110  --  106 108 111  CO2  --  22  --  20*  --  21* 17* 17*  GLUCOSE  --  142*  --  101*  --  127* 130* 92   BUN  --  34*  --  34*  --  30* 24* 20  CREATININE  --  0.99  --  0.94  --  0.79 0.71 0.66  CALCIUM  --  8.0*  --  7.9*  --  7.5* 7.5* 8.0*  MG 2.1 2.0 1.9  --  1.7 1.8  --   --   PHOS 2.9 2.6 1.9*  --  2.6 2.4* 2.8 3.8     Recent Results (from the past 240 hour(s))  MRSA Next Gen by PCR, Nasal     Status: None   Collection Time: 08/22/22  8:15 PM   Specimen: Nasal Mucosa; Nasal Swab  Result Value Ref Range Status   MRSA by PCR Next Gen NOT DETECTED NOT DETECTED Final    Comment: (NOTE) The GeneXpert MRSA Assay (FDA approved for NASAL specimens only), is one component of a comprehensive MRSA colonization surveillance program. It is not intended to diagnose MRSA infection nor to guide or monitor treatment for MRSA infections. Test performance is not FDA approved in patients less than 59 years old. Performed at [facility], [address] [place], [place] [address]   Culture, blood (Routine X 2) w Reflex to ID Panel     Status: None   Collection Time: 08/22/22  8:52 PM   Specimen: BLOOD  Result Value Ref Range Status   Specimen Description BLOOD  Final   Special Requests   Final    LINE DRAW BOTTLES DRAWN AEROBIC AND ANAEROBIC Blood Culture adequate volume   Culture   Final    NO GROWTH 5 DAYS Performed at [facility], [address], [place], [place] [address]    Report Status 08/27/2022 FINAL  Final  Culture, blood (Routine X 2) w Reflex to ID Panel     Status: None   Collection Time: 08/22/22  9:08 PM   Specimen: BLOOD  Result Value Ref Range Status   Specimen Description BLOOD  Final   Special Requests   Final    LINE DRAW BOTTLES DRAWN AEROBIC AND ANAEROBIC Blood Culture adequate volume   Culture   Final    NO GROWTH 5 DAYS Performed at [facility], [address], [place], [place] [address]    Report Status 08/27/2022 FINAL  Final  Body fluid culture w Gram Stain     Status: None (Preliminary result)   Collection Time: 08/31/22  5:03 PM    Specimen: Urine, Random; Body Fluid  Result Value Ref Range Status   Specimen Description URINE, RANDOM  Final   Special Requests Normal  Final   Gram Stain   Final    WBC PRESENT, PREDOMINANTLY MONONUCLEAR NO ORGANISMS SEEN CYTOSPIN SMEAR    Culture   Final    NO GROWTH < 24 HOURS Performed at [facility], [address] [place], [place] [address]    Report Status PENDING  Incomplete     Radiology Studies: IR NEPHROSTOMY PLACEMENT RIGHT  Result Date: 08/31/2022 INDICATION: 72 year old female with obstructing pelvic mass and right-sided hydronephrosis. She presents for placement of a percutaneous nephrostomy tube. EXAM: IR NEPHROSTOMY PLACEMENT RIGHT COMPARISON:  CT abdomen/pelvis 08/30/2022 MEDICATIONS: In patient receiving multiple intravenous antibiotics. No additional prophylaxis was administered. ANESTHESIA/SEDATION: Fentanyl 25 mcg IV; Versed 1 mg IV Moderate Sedation Time:  14 minutes The patient's vital signs and level of consciousness were continuously monitored during the procedure by the interventional radiology nurse under my direct supervision. CONTRAST:  7 mL OMNIPAQUE IOHEXOL 300 MG/ML SOLN - administered into the collecting system(s) FLUOROSCOPY: Radiation exposure index: 2.2 mGy reference air kerma COMPLICATIONS: None immediate. TECHNIQUE: The procedure, risks, benefits, and alternatives were explained to the patient. Questions regarding the procedure were encouraged and answered. The patient understands and consents to the procedure. The right flank was prepped with chlorhexidine in a sterile fashion, and a sterile drape was applied covering the  operative field. A sterile gown and sterile gloves were used for the procedure. Local anesthesia was provided with 1% Lidocaine. The right flank was interrogated with ultrasound and the left kidney identified. The kidney is hydronephrotic. A suitable access site on the skin overlying the lower pole, posterior calix was identified.  After local mg anesthesia was achieved, a small skin nick was made with an 11 blade scalpel. A 21 gauge Accustick needle was then advanced under direct sonographic guidance into the lower pole of the right kidney. A 0.018 inch wire was advanced under fluoroscopic guidance into the left renal collecting system. The Accustick sheath was then advanced over the wire and a 0.018 system exchanged for a 0.035 system. Gentle hand injection of contrast material confirms placement of the sheath within the renal collecting system. There is significant hydronephrosis. The tract from the scan into the renal collecting system was then dilated serially to 10-French. A 10-French Cook all-purpose drain was then placed and positioned under fluoroscopic guidance. The locking loop is well formed within the left renal pelvis. The catheter was secured to the skin with 2-0 Prolene and a sterile bandage was placed. Catheter was left to gravity bag drainage. IMPRESSION: Successful placement of a right 10 French percutaneous nephrostomy tube. A sample of aspirated urine was sent for Gram stain and culture. Electronically Signed   By: Malachy Moan M.D.   On: 08/31/2022 17:11   CT ABDOMEN PELVIS W CONTRAST  Result Date: 08/30/2022 CLINICAL DATA:  Abdominal pain. Right hemicolectomy and ileostomy 8 days ago. Now with feculent drainage EXAM: CT ABDOMEN AND PELVIS WITH CONTRAST TECHNIQUE: Multidetector CT imaging of the abdomen and pelvis was performed using the standard protocol following bolus administration of intravenous contrast. RADIATION DOSE REDUCTION: This exam was performed according to the departmental dose-optimization program which includes automated exposure control, adjustment of the mA and/or kV according to patient size and/or use of iterative reconstruction technique. CONTRAST:  75mL OMNIPAQUE IOHEXOL 350 MG/ML SOLN COMPARISON:  08/22/2022, 08/19/2022 FINDINGS: Lower chest: Small bilateral pleural effusions, left  greater than right. Effusions have increased in size from prior. Associated compressive atelectasis. Hepatobiliary: Liver is enlarged measuring approximately 21 cm in length. No focal liver lesion identified. Gallbladder within normal limits. Pancreas: Unremarkable. No pancreatic ductal dilatation or surrounding inflammatory changes. Spleen: Enlarged spleen with new, near-complete hypoattenuation of the splenic parenchyma. There also appears to be subcapsular low-density fluid along the peripheral margin of the spleen. Adrenals/Urinary Tract: Unremarkable adrenal glands. Severe right hydroureteronephrosis. Layering high attenuation material within the right renal collecting system is likely related to contrast excretion. New mild left hydronephrosis. Urinary bladder within normal limits. Stomach/Bowel: Enteric tube extends into the stomach with distal tip terminating at the level of the duodenal bulb. Interval partial right hemicolectomy with right lower quadrant ostomy. Multiple mildly dilated small bowel loops measuring up to 4.4 cm in diameter. Numerous air-fluid levels. Enteric contrast has partially traversed small bowel. Vascular/Lymphatic: Aortic atherosclerosis. No definite lymphadenopathy. Reproductive: Large calcified uterine fibroid. Ovaries are not definitively seen. Other: Midline surgical incision site. Surgical drain is present within the pelvis with distal tip terminating in the lower right hemiabdomen. Small volume fluid within the abdomen and pelvis with several collections demonstrating partial rim enhancement. For example, collection within the lower right pericolic gutter measures approximately 5.2 x 3.5 x 4.1 cm (series 3, image 58). Collection along the anterior margin of the uterine fundus measures approximately 7.2 x 1.8 x 5.1 cm (series 3, image 69). Small amount of  air is noted along the course of the surgical drain. No significant volume of pneumoperitoneum. Large complex mass occupying  the majority of the low right pelvis is grossly unchanged. Musculoskeletal: No acute osseous abnormality. Increased body wall edema. IMPRESSION: 1. Interval postsurgical changes to the abdomen and pelvis including partial right hemicolectomy with right lower quadrant ostomy. 2. Small volume fluid within the abdomen and pelvis with several collections demonstrating rim-enhancement suggestive of abscesses. 3. New, near-complete hypoattenuation of the splenic parenchyma with subcapsular low-density fluid along the peripheral margin of the spleen. Findings are concerning for global splenic infarct. 4. Multiple mildly dilated small bowel loops with numerous air-fluid levels. Findings are favored to represent postoperative ileus. 5. Persistent severe right hydroureteronephrosis. New mild left hydronephrosis. 6. Small bilateral pleural effusions, left greater than right. Effusions have increased in size from prior. 7. Large complex mass occupying the majority of the low right pelvis is grossly unchanged. 8. Hepatomegaly. 9. Aortic atherosclerosis (ICD10-I70.0). These results will be called to the ordering clinician or representative by the Radiologist Assistant, and communication documented in the PACS or [employer]. Electronically Signed   By: Duanne Guess D.O.   On: 08/30/2022 15:05    Scheduled Meds:  acetaminophen (TYLENOL) oral liquid 160 mg/5 mL  650 mg Per Tube Q6H   arformoterol  15 mcg Nebulization BID   atorvastatin  20 mg Per Tube Daily   budesonide (PULMICORT) nebulizer solution  0.5 mg Nebulization BID   feeding supplement (PROSource TF20)  60 mL Per Tube Daily   ferrous sulfate  325 mg Oral BID WC   insulin aspart  0-9 Units Subcutaneous Q4H   loperamide  2 mg Oral BID   multivitamin with minerals  1 tablet Per Tube Daily   pantoprazole (PROTONIX) IV  40 mg Intravenous Q12H   phosphorus  500 mg Per Tube TID   polycarbophil  625 mg Per Tube BID   revefenacin  175 mcg Nebulization Daily    tamsulosin  0.4 mg Oral Daily   Continuous Infusions:  sodium chloride Stopped (08/29/22 1855)   ceFEPime (MAXIPIME) IV 2 g (09/01/22 1050)   feeding supplement (VITAL 1.5 CAL) 10 mL/hr at 09/01/22 1115   heparin 2,150 Units/hr (09/01/22 0429)   metronidazole 500 mg (09/01/22 0636)   micafungin (MYCAMINE) 100 mg in sodium chloride 0.9 % 100 mL IVPB 100 mg (09/01/22 1123)   promethazine (PHENERGAN) injection (IM or IVPB)       LOS: 14 days   Burnadette Pop, MD [facility] P7/16/2024, 11:30 AM

## 2022-09-01 NOTE — Plan of Care (Signed)
  Problem: Clinical Measurements: Goal: Cardiovascular complication will be avoided 09/01/2022 0424 by Orson Ape, RN Outcome: Progressing 09/01/2022 0157 by Orson Ape, RN Outcome: Progressing   Problem: Coping: Goal: Level of anxiety will decrease 09/01/2022 0424 by Orson Ape, RN Outcome: Progressing 09/01/2022 0157 by Orson Ape, RN Outcome: Progressing

## 2022-09-01 NOTE — Plan of Care (Signed)
  Problem: Health Behavior/Discharge Planning: Goal: Ability to manage health-related needs will improve Outcome: Progressing   Problem: Clinical Measurements: Goal: Ability to maintain clinical measurements within normal limits will improve Outcome: Progressing Goal: Will remain free from infection Outcome: Progressing Goal: Diagnostic test results will improve Outcome: Progressing Goal: Respiratory complications will improve Outcome: Progressing Goal: Cardiovascular complication will be avoided Outcome: Progressing   Problem: Activity: Goal: Risk for activity intolerance will decrease Outcome: Progressing   Problem: Nutrition: Goal: Adequate nutrition will be maintained Outcome: Progressing   Problem: Coping: Goal: Level of anxiety will decrease Outcome: Progressing   Problem: Elimination: Goal: Will not experience complications related to bowel motility Outcome: Progressing Goal: Will not experience complications related to urinary retention Outcome: Progressing   Problem: Pain Managment: Goal: General experience of comfort will improve Outcome: Progressing   Problem: Safety: Goal: Ability to remain free from injury will improve Outcome: Progressing   Problem: Skin Integrity: Goal: Risk for impaired skin integrity will decrease Outcome: Progressing   Problem: Education: Goal: Ability to describe self-care measures that may prevent or decrease complications (Diabetes Survival Skills Education) will improve Outcome: Progressing Goal: Individualized Educational Video(s) Outcome: Progressing   Problem: Coping: Goal: Ability to adjust to condition or change in health will improve Outcome: Progressing   Problem: Fluid Volume: Goal: Ability to maintain a balanced intake and output will improve Outcome: Progressing   Problem: Health Behavior/Discharge Planning: Goal: Ability to identify and utilize available resources and services will improve Outcome:  Progressing Goal: Ability to manage health-related needs will improve Outcome: Progressing   Problem: Metabolic: Goal: Ability to maintain appropriate glucose levels will improve Outcome: Progressing   Problem: Nutritional: Goal: Maintenance of adequate nutrition will improve Outcome: Progressing Goal: Progress toward achieving an optimal weight will improve Outcome: Progressing   Problem: Skin Integrity: Goal: Risk for impaired skin integrity will decrease Outcome: Progressing   Problem: Tissue Perfusion: Goal: Adequacy of tissue perfusion will improve Outcome: Progressing   Problem: Education: Goal: Knowledge of disease or condition will improve Outcome: Progressing Goal: Knowledge of secondary prevention will improve (MUST DOCUMENT ALL) Outcome: Progressing Goal: Knowledge of patient specific risk factors will improve Loraine Leriche N/A or DELETE if not current risk factor) Outcome: Progressing   Problem: Ischemic Stroke/TIA Tissue Perfusion: Goal: Complications of ischemic stroke/TIA will be minimized Outcome: Progressing   Problem: Coping: Goal: Will verbalize positive feelings about self Outcome: Progressing Goal: Will identify appropriate support needs Outcome: Progressing   Problem: Health Behavior/Discharge Planning: Goal: Ability to manage health-related needs will improve Outcome: Progressing Goal: Goals will be collaboratively established with patient/family Outcome: Progressing   Problem: Self-Care: Goal: Ability to participate in self-care as condition permits will improve Outcome: Progressing Goal: Verbalization of feelings and concerns over difficulty with self-care will improve Outcome: Progressing Goal: Ability to communicate needs accurately will improve Outcome: Progressing   Problem: Nutrition: Goal: Risk of aspiration will decrease Outcome: Progressing Goal: Dietary intake will improve Outcome: Progressing

## 2022-09-01 NOTE — Progress Notes (Signed)
4782: Lab called with critical platelet of 932, DR. Howerter notified.  9562: talked with Fayrene Fearing in pharmacy, made aware of pt's platelet being 932 and having bloody drainage in nephrostomy bag. HGB level is 9.7. heparin level was low, received order to increase heparin per pharmacist. Pharmacy told me patient had similar increase in platelets last week. Plan of care continues.

## 2022-09-01 NOTE — Progress Notes (Signed)
ANTICOAGULATION CONSULT NOTE   Pharmacy Consult for heparin  Indication: pulmonary embolus with recent stroke on MRI on 7/10  Allergies  Allergen Reactions   Aspirin Other (See Comments)    Caused an asthma attack   Ibuprofen Swelling and Other (See Comments)    Angioedema and Asthma exacerbation   Sulfonamide Derivatives Swelling and Other (See Comments)    Face swelled SEVERELY   Penicillins Rash    Patient Measurements: Height: 5' 5.98" (167.6 cm) Weight: 77.2 kg (170 lb 3.1 oz) IBW/kg (Calculated) : 59.26 Heparin Dosing Weight: 75 kg  Vital Signs: Temp: 98.4 F (36.9 C) (07/16 1900) Temp Source: Oral (07/16 1900) BP: 119/57 (07/16 1617) Pulse Rate: 110 (07/16 1900)  Labs: Recent Labs    08/30/22 0123 08/30/22 1059 08/31/22 0150 09/01/22 0319 09/01/22 1214 09/01/22 1823  HGB 9.2*  --  9.4* 9.7*  --   --   HCT 31.1*  --  31.3* 32.2*  --   --   PLT 467*  --  655* 932*  --   --   HEPARINUNFRC 0.23*   < > 0.29* <0.10* <0.10* <0.10*  CREATININE 0.79  --  0.71 0.66  --   --    < > = values in this interval not displayed.    Estimated Creatinine Clearance: 68.7 mL/min (by C-G formula based on SCr of 0.66 mg/dL).  Assessment: 72 y.o. female with PE for heparin. Of note: patient with New stroke - Seen on MRI 7/10, suspected thromboembolic.  Heparin level 0.29 units/mL on heparin 2000 units/hr (previously therapeutic). No signs of bleeding reported. Increased to 2050 units/hr. Heparin stopped prior to IR procedure, now resumed per RN with confirmation with IR team.   Heparin level low, was off for several hours yesterday for per nephrostomy tube. Having some old blood in nephrostomy bag but didn't appear to be active bleeding. Hgb stable. Heparin level undetectable x2 at current rate. Confirmed with RN, infiltration with PIVs this afternoon, new PICC placed this evening, heparin infusion changed and now running through PICC at 8PM.  Previously therapeutic x2 days on  lower heparin doses.  Goal of Therapy:  Heparin level 0.3-0.5 units/ml Monitor platelets by anticoagulation protocol: Yes  Plan:  Continue heparin infusion at 2150 units/hr Check heparin level in 8 hours and daily while on heparin Continue to monitor H&H and platelets  Thank you for allowing pharmacy to be a part of this patient's care.  Thelma Barge, PharmD Clinical Pharmacist

## 2022-09-01 NOTE — Progress Notes (Signed)
10 Days Post-Op  Subjective: CC: Was confused and agitated overnight. Also with some n/v. Just getting back from [facility].   Seen with RN and Urology.   TF's have been held. She is A&O x 4 currently. Denies abdominal pain except with palpation. No nausea currently. Having ostomy output.   Afebrile. Tachycardia resolved. No systolic hypotension. WBC down 24.5 > 20.3 > 18.6  Objective: Vital signs in last 24 hours: Temp:  [97.9 F (36.6 C)-98.2 F (36.8 C)] 97.9 F (36.6 C) (07/16 0800) Pulse Rate:  [91-121] 94 (07/16 0800) Resp:  [14-29] 16 (07/16 0800) BP: (122-151)/(59-92) 134/59 (07/16 0800) SpO2:  [87 %-100 %] 100 % (07/16 0800) Weight:  [77.2 kg] 77.2 kg (07/16 0400) Last BM Date : 08/31/22  Intake/Output from previous day: 07/15 0701 - 07/16 0700 In: 340 [P.O.:120; I.V.:220] Out: 3555 [Urine:1360; Emesis/NG output:800; Drains:45; Stool:1350] Intake/Output this shift: No intake/output data recorded.  PE: Gen:  NAD, pleasant Abd: Soft, mild distension, some R sided ttp. Stoma and mucus fistula viable - ostomy appliance with thin dark brown output in bag.  Midline wound overall clean with some fibrinous tissue at the base with granulation tissue otherwise. JP drain with light brown purulent appearing fluid - this was kinked, unkinked with good return  Lab Results:  Recent Labs    08/31/22 0150 09/01/22 0319  WBC 20.3* 18.6*  HGB 9.4* 9.7*  HCT 31.3* 32.2*  PLT 655* 932*   BMET Recent Labs    08/31/22 0150 09/01/22 0319  NA 133* 136  K 4.1 4.0  CL 108 111  CO2 17* 17*  GLUCOSE 130* 92  BUN 24* 20  CREATININE 0.71 0.66  CALCIUM 7.5* 8.0*   PT/INR No results for input(s): "LABPROT", "INR" in the last 72 hours. CMP     Component Value Date/Time   NA 136 09/01/2022 0319   K 4.0 09/01/2022 0319   CL 111 09/01/2022 0319   CO2 17 (L) 09/01/2022 0319   GLUCOSE 92 09/01/2022 0319   BUN 20 09/01/2022 0319   CREATININE 0.66 09/01/2022 0319   CALCIUM 8.0  (L) 09/01/2022 0319   PROT 4.7 (L) 08/31/2022 0150   ALBUMIN 1.5 (L) 08/31/2022 0150   AST 17 08/31/2022 0150   ALT 12 08/31/2022 0150   ALKPHOS 91 08/31/2022 0150   BILITOT 0.5 08/31/2022 0150   GFRNONAA >60 09/01/2022 0319   GFRAA >60 05/11/2015 0231   Lipase  No results found for: "LIPASE"  Studies/Results: IR NEPHROSTOMY PLACEMENT RIGHT  Result Date: 08/31/2022 INDICATION: 72 year old female with obstructing pelvic mass and right-sided hydronephrosis. She presents for placement of a percutaneous nephrostomy tube. EXAM: IR NEPHROSTOMY PLACEMENT RIGHT COMPARISON:  CT abdomen/pelvis 08/30/2022 MEDICATIONS: In patient receiving multiple intravenous antibiotics. No additional prophylaxis was administered. ANESTHESIA/SEDATION: Fentanyl 25 mcg IV; Versed 1 mg IV Moderate Sedation Time:  14 minutes The patient's vital signs and level of consciousness were continuously monitored during the procedure by the interventional radiology nurse under my direct supervision. CONTRAST:  7 mL OMNIPAQUE IOHEXOL 300 MG/ML SOLN - administered into the collecting system(s) FLUOROSCOPY: Radiation exposure index: 2.2 mGy reference air kerma COMPLICATIONS: None immediate. TECHNIQUE: The procedure, risks, benefits, and alternatives were explained to the patient. Questions regarding the procedure were encouraged and answered. The patient understands and consents to the procedure. The right flank was prepped with chlorhexidine in a sterile fashion, and a sterile drape was applied covering the operative field. A sterile gown and sterile gloves were used for the  procedure. Local anesthesia was provided with 1% Lidocaine. The right flank was interrogated with ultrasound and the left kidney identified. The kidney is hydronephrotic. A suitable access site on the skin overlying the lower pole, posterior calix was identified. After local mg anesthesia was achieved, a small skin nick was made with an 11 blade scalpel. A 21 gauge  Accustick needle was then advanced under direct sonographic guidance into the lower pole of the right kidney. A 0.018 inch wire was advanced under fluoroscopic guidance into the left renal collecting system. The Accustick sheath was then advanced over the wire and a 0.018 system exchanged for a 0.035 system. Gentle hand injection of contrast material confirms placement of the sheath within the renal collecting system. There is significant hydronephrosis. The tract from the scan into the renal collecting system was then dilated serially to 10-French. A 10-French Cook all-purpose drain was then placed and positioned under fluoroscopic guidance. The locking loop is well formed within the left renal pelvis. The catheter was secured to the skin with 2-0 Prolene and a sterile bandage was placed. Catheter was left to gravity bag drainage. IMPRESSION: Successful placement of a right 10 French percutaneous nephrostomy tube. A sample of aspirated urine was sent for Gram stain and culture. Electronically Signed   By: Malachy Moan M.D.   On: 08/31/2022 17:11   CT ABDOMEN PELVIS W CONTRAST  Result Date: 08/30/2022 CLINICAL DATA:  Abdominal pain. Right hemicolectomy and ileostomy 8 days ago. Now with feculent drainage EXAM: CT ABDOMEN AND PELVIS WITH CONTRAST TECHNIQUE: Multidetector CT imaging of the abdomen and pelvis was performed using the standard protocol following bolus administration of intravenous contrast. RADIATION DOSE REDUCTION: This exam was performed according to the departmental dose-optimization program which includes automated exposure control, adjustment of the mA and/or kV according to patient size and/or use of iterative reconstruction technique. CONTRAST:  75mL OMNIPAQUE IOHEXOL 350 MG/ML SOLN COMPARISON:  08/22/2022, 08/19/2022 FINDINGS: Lower chest: Small bilateral pleural effusions, left greater than right. Effusions have increased in size from prior. Associated compressive atelectasis.  Hepatobiliary: Liver is enlarged measuring approximately 21 cm in length. No focal liver lesion identified. Gallbladder within normal limits. Pancreas: Unremarkable. No pancreatic ductal dilatation or surrounding inflammatory changes. Spleen: Enlarged spleen with new, near-complete hypoattenuation of the splenic parenchyma. There also appears to be subcapsular low-density fluid along the peripheral margin of the spleen. Adrenals/Urinary Tract: Unremarkable adrenal glands. Severe right hydroureteronephrosis. Layering high attenuation material within the right renal collecting system is likely related to contrast excretion. New mild left hydronephrosis. Urinary bladder within normal limits. Stomach/Bowel: Enteric tube extends into the stomach with distal tip terminating at the level of the duodenal bulb. Interval partial right hemicolectomy with right lower quadrant ostomy. Multiple mildly dilated small bowel loops measuring up to 4.4 cm in diameter. Numerous air-fluid levels. Enteric contrast has partially traversed small bowel. Vascular/Lymphatic: Aortic atherosclerosis. No definite lymphadenopathy. Reproductive: Large calcified uterine fibroid. Ovaries are not definitively seen. Other: Midline surgical incision site. Surgical drain is present within the pelvis with distal tip terminating in the lower right hemiabdomen. Small volume fluid within the abdomen and pelvis with several collections demonstrating partial rim enhancement. For example, collection within the lower right pericolic gutter measures approximately 5.2 x 3.5 x 4.1 cm (series 3, image 58). Collection along the anterior margin of the uterine fundus measures approximately 7.2 x 1.8 x 5.1 cm (series 3, image 69). Small amount of air is noted along the course of the surgical drain. No significant  volume of pneumoperitoneum. Large complex mass occupying the majority of the low right pelvis is grossly unchanged. Musculoskeletal: No acute osseous  abnormality. Increased body wall edema. IMPRESSION: 1. Interval postsurgical changes to the abdomen and pelvis including partial right hemicolectomy with right lower quadrant ostomy. 2. Small volume fluid within the abdomen and pelvis with several collections demonstrating rim-enhancement suggestive of abscesses. 3. New, near-complete hypoattenuation of the splenic parenchyma with subcapsular low-density fluid along the peripheral margin of the spleen. Findings are concerning for global splenic infarct. 4. Multiple mildly dilated small bowel loops with numerous air-fluid levels. Findings are favored to represent postoperative ileus. 5. Persistent severe right hydroureteronephrosis. New mild left hydronephrosis. 6. Small bilateral pleural effusions, left greater than right. Effusions have increased in size from prior. 7. Large complex mass occupying the majority of the low right pelvis is grossly unchanged. 8. Hepatomegaly. 9. Aortic atherosclerosis (ICD10-I70.0). These results will be called to the ordering clinician or representative by the Radiologist Assistant, and communication documented in the PACS or [employer]. Electronically Signed   By: Duanne Guess D.O.   On: 08/30/2022 15:05    Anti-infectives: Anti-infectives (From admission, onward)    Start     Dose/Rate Route Frequency Ordered Stop   08/30/22 0830  ceFEPIme (MAXIPIME) 2 g in sodium chloride 0.9 % 100 mL IVPB        2 g 200 mL/hr over 30 Minutes Intravenous Every 8 hours 08/30/22 0826     08/23/22 2100  ceFEPIme (MAXIPIME) 2 g in sodium chloride 0.9 % 100 mL IVPB  Status:  Discontinued        2 g 200 mL/hr over 30 Minutes Intravenous Every 12 hours 08/23/22 1357 08/30/22 0826   08/23/22 0400  micafungin (MYCAMINE) 100 mg in sodium chloride 0.9 % 100 mL IVPB        100 mg 105 mL/hr over 1 Hours Intravenous Daily 08/23/22 0242     08/22/22 1800  metroNIDAZOLE (FLAGYL) IVPB 500 mg        500 mg 100 mL/hr over 60 Minutes  Intravenous Every 12 hours 08/22/22 1711     08/22/22 1800  ceFEPIme (MAXIPIME) 2 g in sodium chloride 0.9 % 100 mL IVPB  Status:  Discontinued        2 g 200 mL/hr over 30 Minutes Intravenous Every 8 hours 08/22/22 1716 08/23/22 1357   08/22/22 1724  metroNIDAZOLE (FLAGYL) 500 MG/100ML IVPB       Note to Pharmacy: [name]: cabinet override      08/22/22 1724 08/23/22 0845   08/19/22 1030  cefTRIAXone (ROCEPHIN) 1 g in sodium chloride 0.9 % 100 mL IVPB  Status:  Discontinued        1 g 200 mL/hr over 30 Minutes Intravenous Every 24 hours 08/19/22 0937 08/22/22 1715        Assessment/Plan POD 10 s/p exploratory laparotomy, right hemicolectomy, ileostomy, mucus fistula, biopsy of pelvic mass by Dr. 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 Margins negative. 0/43 lymph nodes.  - CEA - 143, CA 125: 40 and CA 19-9: 50 - Has had CT CAP since admission - Recommend oncology consult  - High ileostomy output. Cont fiber, iron and immodium. Discussed with attending, will hold off on adding anything additional for now. Monitor output, Cr, electrolytes. Avoid sugary drinks.  - WOC for stoma teaching and assistance with pouching. - BID wet to  dry to midline - Cont JP drain - CT 7/14 obtained for drain appearing feculent - this showed small volume fluid within the abdomen and pelvis with several collections demonstrating rim-enhancement suggestive of abscesses -  lower right pericolic gutter fluid collection measures 5.2 x 3.5 x 4.1 cm and collection along the anterior margin of the uterine fundus measures approximately 7.2 x 1.8 x 5.1 cm. Surgical drain is near both of these collections but does not necessarily course through them completely. IR consulted for drainage. They felt the intra-abdominal fluid collections are small and look like post-op blood. Cont abx for  now. WBC improving. Will discuss duration with MD.  - I am okay with restarting trickle tf's today. If any further n/v would hold TF's and get abdominal xray. If tolerates TF's could consider advancing tomorrow +/- adding back PO's if still okay with SLP.   Agree with continued palliative discussions   FEN: NPO and hold TF's for IR consult. IVF per [facility] VTE: SCDs, Heparin gtt ID: Cefepime/flagyl/micafungin Foley: Removed, Monitor. Urology following. S/p R PCN Dispo - As above  Per [facility] New stroke - Seen on MRI 7/10, suspected thromboembolic, defer to primary team/stroke team for further w/u and management. Primary getting CTH today for increased confusion.  PE - heparin drip per primary. hours till we determine if needs CT A/P.  HTN HLD GERD COPD R hydro - s/p R PCN CT 7/14 also suggestion of splenic infarct and bilateral pleural effusions    LOS: 14 days    Jacinto Halim , [facility] 09/01/2022, 9:44 AM Please see Amion for pager number during day hours 7:00am-4:30pm

## 2022-09-01 NOTE — Plan of Care (Signed)
   Problem: Pain Managment: Goal: General experience of comfort will improve Outcome: Progressing   Problem: Safety: Goal: Ability to remain free from injury will improve Outcome: Progressing   Problem: Skin Integrity: Goal: Risk for impaired skin integrity will decrease Outcome: Progressing   

## 2022-09-01 NOTE — Progress Notes (Signed)
Dear Doctor: Renford Dills This patient has been identified as a candidate for PICC for the following reason (s): poor veins/poor circulatory system (CHF, COPD, emphysema, diabetes, steroid use, IV drug abuse, etc.) If you agree, please write an order for the indicated device. For any questions contact the Vascular Access Team at [phone] if no answer, please leave a message.  Thank you for supporting the early vascular access assessment program. HS Nedra Hai RN

## 2022-09-01 NOTE — Progress Notes (Signed)
Nephrostomy bag output is more bloody, it was pinked tinged after patient thew it on the floor last night. But becoming more bloody this morning but not fresh bright blood. About same time last night when she was very agitated had a burst of SVT. On call Physician DR. Howerter notified, ornder for type and screen received, recent vitals updated to MD. Patient shows no s/s of distress. Continue to monitor per MD.

## 2022-09-01 NOTE — Progress Notes (Signed)
Patient's Lt. Arm is swollen +3-4 edematous with weakness due to CVA. Assessed Rt. Arm for PIV access, there is only one suitable vein for PIV access and this patient needs PICC line for other medications. Informed patient's RN regarding infiltration on Lt. Arm and remove PIV access as well as recommended to document of degree of infiltration. HS [employer]

## 2022-09-01 NOTE — Progress Notes (Signed)
Hypoglycemic Event  CBG: 69  Treatment: D50 25 mL (12.5 gm)  Symptoms: None  Follow-up CBG Time: 1259 CBG Result: 85  Possible Reasons for Event: Other: tube feeds had been on hold since yesterday 08/31/2022; tube feeds have now resumed  Comments/MD notified: Burnadette Pop, MD    Merrilyn Puma, RN

## 2022-09-01 NOTE — Progress Notes (Signed)
10 Days Post-Op Subjective: 7/16: NAEON. Pt alert and oriented. Just getting back from CT on my arrival.   Objective: Vital signs in last 24 hours: Temp:  [97.9 F (36.6 C)-98.2 F (36.8 C)] 97.9 F (36.6 C) (07/16 0800) Pulse Rate:  [91-121] 94 (07/16 0800) Resp:  [14-29] 16 (07/16 0800) BP: (122-151)/(59-92) 134/59 (07/16 0800) SpO2:  [87 %-100 %] 100 % (07/16 0800) Weight:  [77.2 kg] 77.2 kg (07/16 0400)  Intake/Output from previous day: 07/15 0701 - 07/16 0700 In: 340 [P.O.:120; I.V.:220] Out: 3555 [Urine:1360; Emesis/NG output:800; Drains:45; Stool:1350]  Intake/Output this shift: No intake/output data recorded.  Physical Exam:  General: Alert and oriented CV: No cyanosis Lungs: equal chest rise Abdomen: Soft, NTND, no rebound or guarding Gu: Pure wick catheter  Lab Results: Recent Labs    08/30/22 0123 08/31/22 0150 09/01/22 0319  HGB 9.2* 9.4* 9.7*  HCT 31.1* 31.3* 32.2*   BMET Recent Labs    08/31/22 0150 09/01/22 0319  NA 133* 136  K 4.1 4.0  CL 108 111  CO2 17* 17*  GLUCOSE 130* 92  BUN 24* 20  CREATININE 0.71 0.66  CALCIUM 7.5* 8.0*     Studies/Results: IR NEPHROSTOMY PLACEMENT RIGHT  Result Date: 08/31/2022 INDICATION: 71 year old female with obstructing pelvic mass and right-sided hydronephrosis. She presents for placement of a percutaneous nephrostomy tube. EXAM: IR NEPHROSTOMY PLACEMENT RIGHT COMPARISON:  CT abdomen/pelvis 08/30/2022 MEDICATIONS: In patient receiving multiple intravenous antibiotics. No additional prophylaxis was administered. ANESTHESIA/SEDATION: Fentanyl 25 mcg IV; Versed 1 mg IV Moderate Sedation Time:  14 minutes The patient's vital signs and level of consciousness were continuously monitored during the procedure by the interventional radiology nurse under my direct supervision. CONTRAST:  7 mL OMNIPAQUE IOHEXOL 300 MG/ML SOLN - administered into the collecting system(s) FLUOROSCOPY: Radiation exposure index: 2.2 mGy  reference air kerma COMPLICATIONS: None immediate. TECHNIQUE: The procedure, risks, benefits, and alternatives were explained to the patient. Questions regarding the procedure were encouraged and answered. The patient understands and consents to the procedure. The right flank was prepped with chlorhexidine in a sterile fashion, and a sterile drape was applied covering the operative field. A sterile gown and sterile gloves were used for the procedure. Local anesthesia was provided with 1% Lidocaine. The right flank was interrogated with ultrasound and the left kidney identified. The kidney is hydronephrotic. A suitable access site on the skin overlying the lower pole, posterior calix was identified. After local mg anesthesia was achieved, a small skin nick was made with an 11 blade scalpel. A 21 gauge Accustick needle was then advanced under direct sonographic guidance into the lower pole of the right kidney. A 0.018 inch wire was advanced under fluoroscopic guidance into the left renal collecting system. The Accustick sheath was then advanced over the wire and a 0.018 system exchanged for a 0.035 system. Gentle hand injection of contrast material confirms placement of the sheath within the renal collecting system. There is significant hydronephrosis. The tract from the scan into the renal collecting system was then dilated serially to 10-French. A 10-French Cook all-purpose drain was then placed and positioned under fluoroscopic guidance. The locking loop is well formed within the left renal pelvis. The catheter was secured to the skin with 2-0 Prolene and a sterile bandage was placed. Catheter was left to gravity bag drainage. IMPRESSION: Successful placement of a right 10 French percutaneous nephrostomy tube. A sample of aspirated urine was sent for Gram stain and culture. Electronically Signed   By:  Malachy Moan M.D.   On: 08/31/2022 17:11   CT ABDOMEN PELVIS W CONTRAST  Result Date: 08/30/2022 CLINICAL  DATA:  Abdominal pain. Right hemicolectomy and ileostomy 8 days ago. Now with feculent drainage EXAM: CT ABDOMEN AND PELVIS WITH CONTRAST TECHNIQUE: Multidetector CT imaging of the abdomen and pelvis was performed using the standard protocol following bolus administration of intravenous contrast. RADIATION DOSE REDUCTION: This exam was performed according to the departmental dose-optimization program which includes automated exposure control, adjustment of the mA and/or kV according to patient size and/or use of iterative reconstruction technique. CONTRAST:  75mL OMNIPAQUE IOHEXOL 350 MG/ML SOLN COMPARISON:  08/22/2022, 08/19/2022 FINDINGS: Lower chest: Small bilateral pleural effusions, left greater than right. Effusions have increased in size from prior. Associated compressive atelectasis. Hepatobiliary: Liver is enlarged measuring approximately 21 cm in length. No focal liver lesion identified. Gallbladder within normal limits. Pancreas: Unremarkable. No pancreatic ductal dilatation or surrounding inflammatory changes. Spleen: Enlarged spleen with new, near-complete hypoattenuation of the splenic parenchyma. There also appears to be subcapsular low-density fluid along the peripheral margin of the spleen. Adrenals/Urinary Tract: Unremarkable adrenal glands. Severe right hydroureteronephrosis. Layering high attenuation material within the right renal collecting system is likely related to contrast excretion. New mild left hydronephrosis. Urinary bladder within normal limits. Stomach/Bowel: Enteric tube extends into the stomach with distal tip terminating at the level of the duodenal bulb. Interval partial right hemicolectomy with right lower quadrant ostomy. Multiple mildly dilated small bowel loops measuring up to 4.4 cm in diameter. Numerous air-fluid levels. Enteric contrast has partially traversed small bowel. Vascular/Lymphatic: Aortic atherosclerosis. No definite lymphadenopathy. Reproductive: Large calcified  uterine fibroid. Ovaries are not definitively seen. Other: Midline surgical incision site. Surgical drain is present within the pelvis with distal tip terminating in the lower right hemiabdomen. Small volume fluid within the abdomen and pelvis with several collections demonstrating partial rim enhancement. For example, collection within the lower right pericolic gutter measures approximately 5.2 x 3.5 x 4.1 cm (series 3, image 58). Collection along the anterior margin of the uterine fundus measures approximately 7.2 x 1.8 x 5.1 cm (series 3, image 69). Small amount of air is noted along the course of the surgical drain. No significant volume of pneumoperitoneum. Large complex mass occupying the majority of the low right pelvis is grossly unchanged. Musculoskeletal: No acute osseous abnormality. Increased body wall edema. IMPRESSION: 1. Interval postsurgical changes to the abdomen and pelvis including partial right hemicolectomy with right lower quadrant ostomy. 2. Small volume fluid within the abdomen and pelvis with several collections demonstrating rim-enhancement suggestive of abscesses. 3. New, near-complete hypoattenuation of the splenic parenchyma with subcapsular low-density fluid along the peripheral margin of the spleen. Findings are concerning for global splenic infarct. 4. Multiple mildly dilated small bowel loops with numerous air-fluid levels. Findings are favored to represent postoperative ileus. 5. Persistent severe right hydroureteronephrosis. New mild left hydronephrosis. 6. Small bilateral pleural effusions, left greater than right. Effusions have increased in size from prior. 7. Large complex mass occupying the majority of the low right pelvis is grossly unchanged. 8. Hepatomegaly. 9. Aortic atherosclerosis (ICD10-I70.0). These results will be called to the ordering clinician or representative by the Radiologist Assistant, and communication documented in the PACS or [employer].  Electronically Signed   By: Duanne Guess D.O.   On: 08/30/2022 15:05    Assessment/Plan: # Severe right hydronephrosis 2/2 abdominal mass  Preserved renal function. Trend labs Unfortunately it appears patient had bowel perforation shortly after we saw her  and required hemicolectomy.  Repeat renal ultrasound was collected 7/11.  Right side hydronephrosis was unchanged.   No plan for further abd surgery for many months.  R. PCNT 20f placed 7/15  -Pt became confused overnight and pulled PCNT. Now bloody drainage. Some minor stringy clot accumulation at [employer]. Removed. Flushed easily. Some additional stringy clot returned.     LOS: 14 days   Elmon Kirschner, NP [facility] Pager: [phone]  09/01/2022, 10:06 AM

## 2022-09-01 NOTE — Progress Notes (Addendum)
Pt is very agitated,desperate to go home. She thought we had held her hostage. She refused to take her medication and told me not to touch her. She threw her Nephrostomy bag on the floor, after that output became pink tinged. I called her son [name] .  Pt wanted him to come and get her right away. When son said she had to stay, she got so angry that she threw room phone on the floor. She said she is going to call 911 and have [place] arrested. Constantly trying to reorient her, but unsuccessful. Bed alarm on, plan of care continues.

## 2022-09-01 NOTE — Progress Notes (Signed)
Nutrition Follow-up  DOCUMENTATION CODES:   Non-severe (moderate) malnutrition in context of chronic illness  INTERVENTION:  Trickle tube feeds via NG-tube: Vital 1.5 at 10 mL/hr Once medically appropriate, recommend advancing by 10 mL every 8 hours to goal rate of 50 mL/hr (1200 mL per day) 60 mL ProSource TF20 - Daily Tube feeds at goal provides 1880 kcal, 101 gm protein, and 917 mL free water daily.  - Consider initiation of TPN tomorrow in setting of inadequate enteral infusion for >7 days. (RD messaged MD and surgical PA)  NUTRITION DIAGNOSIS:   Moderate Malnutrition related to chronic illness as evidenced by mild fat depletion, moderate muscle depletion.  GOAL:   Patient will meet greater than or equal to 90% of their needs  MONITOR:   Diet advancement, Labs, Weight trends, TF tolerance, I & O's  REASON FOR ASSESSMENT:   Consult Enteral/tube feeding initiation and management  ASSESSMENT:   72 y.o. female presented to the ED with weakness, N/V, and abdominal pain. PMH includes COPD, HTN, HLD, GERD, and anxiety. Pt admitted with anemia 2/2 possible upper GI bleed, pelvic mass, and incidental stroke finding.  Meds reviewed: lipitor, ferrous sulfate, sliding scale insulin, imodium, MVI, K phos, fibercon. Labs reviewed: Na low, BUN elevated. IVF: D5 @ 75 mL/hr.   Pt is currently POD10 from exlap, right hemicolectomy, ileostomy, mucus fistula, biopsy of pelvic mass. Pt with a total of 2000 mL output via colostomy. TF have been off and on for the past 7 days with patient only reaching her goal rate possibly for a total of 24 hrs. Pt with continued nausea and vomiting. Tube feeds have been restarted at 10 mL/hr. RD messaged MD and Surgical PA about needing to start TPN tomorrow to provide patient with nutrition. RD will continue to closely monitor POC.   Diet Order:   Diet Order             Diet NPO time specified Except for: Sips with Meds  Diet effective now                    EDUCATION NEEDS:   Not appropriate for education at this time  Skin:  Skin Assessment: Reviewed RN Assessment  Last BM:  7/15 - type 7  Height:   Ht Readings from Last 1 Encounters:  08/22/22 5' 5.98" (1.676 m)    Weight:   Wt Readings from Last 1 Encounters:  09/01/22 77.2 kg    Ideal Body Weight:  59.1 kg  BMI:  Body mass index is 27.48 kg/m.  Estimated Nutritional Needs:   Kcal:  1850-2050  Protein:  90-110 grams  Fluid:  >/= 1.8 L  Bethann Humble, RD, LDN, CNSC.

## 2022-09-01 NOTE — Progress Notes (Signed)
TRH night cross cover note:   I was notified by RN of pink tinged appearance in urine output through nephrostomy. Patient without new compliant at this time. VS appear stable. CBC ordered for the AM. I have also ordered type and screen.    Newton Pigg, DO Hospitalist

## 2022-09-01 NOTE — Progress Notes (Signed)
Left upper extremity venous study completed.   Preliminary results relayed to MD and RN.  Please see CV Procedures for preliminary results.  Kaeleb Emond, RVT  1:36 PM 09/01/22

## 2022-09-01 NOTE — TOC Progression Note (Signed)
Transition of Care (TOC) - Progression Note  Donn Pierini RN, BSN Transitions of Care Unit 4E- RN Case Manager See Treatment Team for direct phone #   Patient Details  Name: Breianna Delfino MRN: [id number] Date of Birth: [birth year]  Transition of Care [facility]) CM/SW Contact  Zenda Alpers Lenn Sink, RN Phone Number: 09/01/2022, 3:08 PM  Clinical Narrative:    Per [facility] liaison, referral received while pt in ICU, son agreeable to [facility]. Liaison following for clinical stability. Pt not ready for transition to [facility] at this time per MD.  [facility] will follow for [facility] readiness along with [facility] liaison.     Expected Discharge Plan: Home/Self Care Barriers to Discharge: Continued Medical Work up  Expected Discharge Plan and Services       Living arrangements for the past 2 months: Single Family Home                                       Social Determinants of Health (SDOH) Interventions SDOH Screenings   Food Insecurity: No Food Insecurity (08/19/2022)  Housing: Low Risk  (08/19/2022)  Transportation Needs: No Transportation Needs (08/19/2022)  Utilities: Not At Risk (08/19/2022)  Tobacco Use: Medium Risk (08/22/2022)    Readmission Risk Interventions     No data to display

## 2022-09-01 NOTE — Progress Notes (Signed)
PT Cancellation Note  Patient Details Name: Debra Sharp MRN: [id number] DOB: January 16, 1951   Cancelled Treatment:    Reason Eval/Treat Not Completed: (P) Patient at procedure or test/unavailable (Pt is currently off the floor for an ultrasound. Will continue to follow per PT POC.)   Johny Shock 09/01/2022, 2:19 PM

## 2022-09-01 NOTE — Progress Notes (Signed)
ANTICOAGULATION CONSULT NOTE   Pharmacy Consult for heparin  Indication: pulmonary embolus with recent stroke on MRI on 7/10  Allergies  Allergen Reactions   Aspirin Other (See Comments)    Caused an asthma attack   Ibuprofen Swelling and Other (See Comments)    Angioedema and Asthma exacerbation   Sulfonamide Derivatives Swelling and Other (See Comments)    Face swelled SEVERELY   Penicillins Rash    Patient Measurements: Height: 5' 5.98" (167.6 cm) Weight: 81.7 kg (180 lb 1.9 oz) IBW/kg (Calculated) : 59.26 Heparin Dosing Weight: 75kg  Vital Signs: Temp: 98.1 F (36.7 C) (07/15 1900) Temp Source: Oral (07/15 1900) BP: 151/92 (07/15 2314) Pulse Rate: 102 (07/16 0008)  Labs: Recent Labs    08/30/22 0123 08/30/22 1059 08/30/22 1935 08/31/22 0150 09/01/22 0319  HGB 9.2*  --   --  9.4* 9.7*  HCT 31.1*  --   --  31.3* 32.2*  PLT 467*  --   --  655* 932*  HEPARINUNFRC 0.23*   < > 0.37 0.29* <0.10*  CREATININE 0.79  --   --  0.71 0.66   < > = values in this interval not displayed.    Estimated Creatinine Clearance: 70.6 mL/min (by C-G formula based on SCr of 0.66 mg/dL).  Assessment: 72 y.o. female with PE for heparin. Of note: patient with New stroke - Seen on MRI 7/10, suspected thromboembolic.  Heparin level 0.29 units/mL on heparin 2000 units/hr (previously therapeutic). No signs of bleeding reported. Increased to 2050 units/hr. Heparin stopped prior to IR procedure, now resumed per RN with confirmation with IR team.   7/16 AM update:   Heparin level low Was off for several hours yesterday for per nephrostomy tube Having some old blood in nephrostomy bag-doesn't appear to be active bleeding-Hgb stable  Goal of Therapy:  Heparin level 0.3-0.5 units/ml Monitor platelets by anticoagulation protocol: Yes  Plan:  Inc heparin to 2150 units/hr 1200 heparin level Monitor nephrostomy site for any bleeding  Abran Duke, PharmD, BCPS Clinical  Pharmacist Phone: [phone]

## 2022-09-02 DIAGNOSIS — D509 Iron deficiency anemia, unspecified: Secondary | ICD-10-CM | POA: Diagnosis not present

## 2022-09-02 DIAGNOSIS — A4189 Other specified sepsis: Secondary | ICD-10-CM

## 2022-09-02 DIAGNOSIS — C786 Secondary malignant neoplasm of retroperitoneum and peritoneum: Secondary | ICD-10-CM | POA: Diagnosis not present

## 2022-09-02 DIAGNOSIS — Z87891 Personal history of nicotine dependence: Secondary | ICD-10-CM

## 2022-09-02 DIAGNOSIS — D75839 Thrombocytosis, unspecified: Secondary | ICD-10-CM

## 2022-09-02 DIAGNOSIS — D638 Anemia in other chronic diseases classified elsewhere: Secondary | ICD-10-CM | POA: Diagnosis not present

## 2022-09-02 DIAGNOSIS — I509 Heart failure, unspecified: Secondary | ICD-10-CM

## 2022-09-02 DIAGNOSIS — G9341 Metabolic encephalopathy: Secondary | ICD-10-CM | POA: Diagnosis not present

## 2022-09-02 DIAGNOSIS — D649 Anemia, unspecified: Secondary | ICD-10-CM | POA: Diagnosis not present

## 2022-09-02 DIAGNOSIS — K620 Anal polyp: Secondary | ICD-10-CM

## 2022-09-02 DIAGNOSIS — C179 Malignant neoplasm of small intestine, unspecified: Secondary | ICD-10-CM | POA: Diagnosis not present

## 2022-09-02 DIAGNOSIS — I110 Hypertensive heart disease with heart failure: Secondary | ICD-10-CM

## 2022-09-02 LAB — BASIC METABOLIC PANEL
Anion gap: 5 (ref 5–15)
BUN: 19 mg/dL (ref 8–23)
CO2: 18 mmol/L — ABNORMAL LOW (ref 22–32)
Calcium: 7.7 mg/dL — ABNORMAL LOW (ref 8.9–10.3)
Chloride: 111 mmol/L (ref 98–111)
Creatinine, Ser: 0.78 mg/dL (ref 0.44–1.00)
GFR, Estimated: >60 mL/min (ref >60)
Glucose, Bld: 110 mg/dL — ABNORMAL HIGH (ref 70–99)
Potassium: 3.8 mmol/L (ref 3.5–5.1)
Sodium: 134 mmol/L — ABNORMAL LOW (ref 135–145)

## 2022-09-02 LAB — GLUCOSE, CAPILLARY
Glucose-Capillary: 102 mg/dL — ABNORMAL HIGH (ref 70–99)
Glucose-Capillary: 114 mg/dL — ABNORMAL HIGH (ref 70–99)
Glucose-Capillary: 74 mg/dL (ref 70–99)
Glucose-Capillary: 75 mg/dL (ref 70–99)
Glucose-Capillary: 82 mg/dL (ref 70–99)
Glucose-Capillary: 86 mg/dL (ref 70–99)
Glucose-Capillary: 86 mg/dL (ref 70–99)
Glucose-Capillary: 88 mg/dL (ref 70–99)

## 2022-09-02 LAB — CBC
HCT: 28.6 % — ABNORMAL LOW (ref 36.0–46.0)
Hemoglobin: 8.5 g/dL — ABNORMAL LOW (ref 12.0–15.0)
MCH: 24.2 pg — ABNORMAL LOW (ref 26.0–34.0)
MCHC: 29.7 g/dL — ABNORMAL LOW (ref 30.0–36.0)
MCV: 81.5 fL (ref 80.0–100.0)
Platelets: 1044 10*3/uL (ref 150–400)
RBC: 3.51 MIL/uL — ABNORMAL LOW (ref 3.87–5.11)
RDW: 27.1 % — ABNORMAL HIGH (ref 11.5–15.5)
WBC: 16.2 10*3/uL — ABNORMAL HIGH (ref 4.0–10.5)
nRBC: 0 % (ref 0.0–0.2)

## 2022-09-02 LAB — HEPARIN LEVEL (UNFRACTIONATED)
Heparin Unfractionated: 0.95 IU/mL — ABNORMAL HIGH (ref 0.30–0.70)
Heparin Unfractionated: >1.1 IU/mL — ABNORMAL HIGH (ref 0.30–0.70)
Heparin Unfractionated: >1.1 IU/mL — ABNORMAL HIGH (ref 0.30–0.70)
Heparin Unfractionated: 0.53 IU/mL (ref 0.30–0.70)

## 2022-09-02 MED ORDER — HEPARIN (PORCINE) 25000 UT/250ML-% IV SOLN
1850.0000 [IU]/h | INTRAVENOUS | Status: DC
  Administered 2022-09-02: 1850 [IU]/h via INTRAVENOUS
  Administered 2022-09-02: 1900 [IU]/h via INTRAVENOUS
  Filled 2022-09-02 (×2): qty 250

## 2022-09-02 NOTE — Progress Notes (Signed)
Nutrition Brief Note:   Discussed initiation of TPN with MD and Surgical PA again today. RD recommends TPN via PICC line due to inadequate nutrition for 15 days and previous intolerance of TF advancement. Pt remains with Cortrak in place and TF still running at a trickle rate. Pt on a CL diet at this time. Pt full code and desiring aggressive measures. PA would like to hold off on TPN for now and continue with TF advancement. RD will continue to monitor POC, TF advancement and tolerance, and diet advancement and tolerance.   Bethann Humble, RD, LDN, CNSC.

## 2022-09-02 NOTE — Progress Notes (Addendum)
PROGRESS NOTE  Jonet Mathies UJW:[id number] DOB: May 31, 1950 DOA: 08/18/2022 PCP: Clayborn Heron, MD  Brief History:  72 year old female with past medical history of hypertension, hyperlipidemia, systolic CHF, thrombocytopenia, COPD, asthma who initially presented with weakness, abdominal pain. Workup showed anemia concerning for upper GI bleed. CT abdomen/pelvis showed colonic distention, possible obstruction. GI consulted, plan was for EGD but she could not tolerate prep. Also found to have right-sided hydronephrosis, incidental acute stroke on CT/MRI. Repeat CT abdomen/pelvis on 7/6 showed new intraperitoneal free air with presumed perforation of the colon. Surgery consulted and she underwent emergent exploratory laparotomy, right hemicolectomy, ileostomy with fistula, biopsy of pelvic mass. Patient was left intubated and was transferred to ICU for pain management. 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 Palliative care consulted. General surgery following. Currently on tube feeding. Hospital course also remarkable for finding of acute PE now on heparin drip. Now extubated and transferred to [facility] service on 7/12. Plan was for TEE which has been canceled because patient declined. Palliative care, urology ,surgery ,neurology were following. PT/OT recommending SNF on discharge.   Assessment/Plan:   Septic shock secondary to acute peritonitis due to colonic perforation due to obstructing pelvic mass: Status post exploratory laparotomy with right hemicolectomy, end ileostomy creation.  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  Currently in broad spectrum antibiotics with micafungin, Flagyl, cefepime.  Wound  cultures NGTD.  On tube feed.Speech therapy consulted to advance the diet by mouth  Currently hemodynamically stable.  General surgery following.  Leukocytosis persist.  She is afebrile.  Denies abdominal pain, nausea or vomiting this mrng.CT  abdomen/pelvis for follow-up for leukocytosis was done on 7/14 showed possibility of new abscesses, postoperative ileus, severe right hydroureteronephrosis.  IR declined to drain for new finding of abscess saying that they are too small and may not be abscess at all.   Pelvic mass: Culprit for colonic perforation.  Initially concern for gynecological malignancy with elevated CA125, CA 19-19 but pathology showed small bowel primary.   General surgery recommending arranging outpatient follow-up with oncology before discharge, no need for immediate consultation.  I have sent message to Dr. Leonides Schanz regarding follow-up appointment before discharge and he will make arrangements for outpatient follow-up. Follow up CT showed large complex mass occupying the majority of the low right pelvis grossly unchanged.   Acute metabolic encephalopathy: Likely from sepsis, delirium, CVAs.  This morning she remains alert oriented.  Confusion is intermittent.  -repeat CT brain neg for acute findings -7/17--pt is lucid and conversant   Acute CVA: MRI done on 7/4 showed single punctuate enhancement in the right frontoparietal area, no evidence of mets.  EEG done on 7/9 did not show any seizure.  Suspected to be embolic CVA versus secondary to hypoperfusion in the setting of anemia/septic shock.  Neurology were following.  There was plan for TEE but patient has declined. .  Continue statin.  PT/OT/SLP following.  Recommended SNF on discharge She has left-sided weakness. Hardly  able to get up from the bed.  Currently on heparin drip, will change to Eliquis  when she does not need any more surgery and is able to tolerate po intake   Acute PE: CT angiogram showed acute PE.  Currently on heparin drip.  No evidence of right heart strain.  Will start her on Eliquis when surgery clears   AKI/hyperkalemia/hypophosphatemia:   Currently kidney function is stable.Phosphorus being supplemented   Hydronephrosis secondary to  abdominal  mass: Urology following.  Follow-up CT showed persistent severe right hydroureteronephrosis.  IR did  PCNL placement on 7/15 but she pulled out some 7/15 night -urology saw pt 7/16--and still flushes easily   Acute on chronic renal disease anemia/acute blood loss anemia secondary to surgery/chronic GI bleed due to small bowel adenocarcinoma: Monitor hemoglobin.  Transfuse if less than 7.  Currently on PPI.  Currently hemoglobin is stable    Hypertension: Off hypertensives.  Monitor blood pressure   Hyperlipidemia: Currently on Lipitor   Chronic systolic CHF: Echo on 08/18/2020 showed EF of 40 to 45%, global hypokinesis, normal right ventricular function.  Previous echo on March 2024 showed EF of 30 to 35%.  We recommend follow-up with cardiology as an outpatient   Hyperglycemia: Currently on sliding scale insulin.  Monitor blood sugars   Left upper extremity edema: This is most likely from IV line.  Will check venous Doppler to rule out DVT   Goals of care: Multiple comorbidities.  General surgery recommended palliative care consulted for new finding of malignancy, and incomplete restriction.  Palliative care were following.  Remains full code.  I again discussed the goals of care with the son.  He understands her prognosis but continues to request for aggressive treatment for now         Nutrition Problem: Moderate Malnutrition Etiology: chronic illness   DVT prophylaxis:SCDs Start: 08/18/22 1710       Code Status: Full Code   Family Communication: Discussed with son on  on 7/16   Patient status:Inpatient   Patient is from :home   Anticipated discharge to:SnF   Estimated DC date:after general surgery clearance     Consultants: General surgery, PCCM, palliative care, neurology   Procedures: Exploratory laparotomy   Antimicrobials:  Anti-infectives (From admission, onward)        Start     Dose/Rate Route Frequency Ordered Stop    08/30/22 0830   ceFEPIme (MAXIPIME) 2 g  in sodium chloride 0.9 % 100 mL IVPB        2 g 200 mL/hr over 30 Minutes Intravenous Every 8 hours 08/30/22 0826      08/23/22 2100   ceFEPIme (MAXIPIME) 2 g in sodium chloride 0.9 % 100 mL IVPB  Status:  Discontinued        2 g 200 mL/hr over 30 Minutes Intravenous Every 12 hours 08/23/22 1357 08/30/22 0826    08/23/22 0400   micafungin (MYCAMINE) 100 mg in sodium chloride 0.9 % 100 mL IVPB        100 mg 105 mL/hr over 1 Hours Intravenous Daily 08/23/22 0242      08/22/22 1800   metroNIDAZOLE (FLAGYL) IVPB 500 mg        500 mg 100 mL/hr over 60 Minutes Intravenous Every 12 hours 08/22/22 1711      08/22/22 1800   ceFEPIme (MAXIPIME) 2 g in sodium chloride 0.9 % 100 mL IVPB  Status:  Discontinued        2 g 200 mL/hr over 30 Minutes Intravenous Every 8 hours 08/22/22 1716 08/23/22 1357    08/22/22 1724   metroNIDAZOLE (FLAGYL) 500 MG/100ML IVPB       Note to Pharmacy: [name]: cabinet override         08/22/22 1724 08/23/22 0845    08/19/22 1030   cefTRIAXone (ROCEPHIN) 1 g in sodium chloride 0.9 % 100 mL IVPB  Status:  Discontinued  1 g 200 mL/hr over 30 Minutes Intravenous Every 24 hours 08/19/22 0937 08/22/22 1715                 Family Communication:   Family at bedside  Consultants:    Code Status:  FULL / DNR  DVT Prophylaxis:  [place] Heparin / [place] Lovenox   Procedures: As Listed in Progress Note Above  Antibiotics: None  RN Pressure Injury Documentation:        Subjective: Patient denies fevers, chills, headache, chest pain, dyspnea, nausea, vomiting, diarrhea, abdominal pain, dysuria, hematuria, hematochezia, and melena.   Objective: Vitals:   09/02/22 0725 09/02/22 0726 09/02/22 0727 09/02/22 0729  BP:    (!) 166/76  Pulse:    87  Resp:    14  Temp:    97.9 F (36.6 C)  TempSrc:    Oral  SpO2: 100% 100% 99% 100%  Weight:      Height:        Intake/Output Summary (Last 24 hours) at 09/02/2022 1041 Last data filed at 09/02/2022  0449 Gross per 24 hour  Intake 819.87 ml  Output 1480 ml  Net -660.13 ml   Weight change: 0.9 kg Exam:  General:  Pt is alert, follows commands appropriately, not in acute distress HEENT: No icterus, No thrush, No neck mass, [place]/AT Cardiovascular: RRR, S1/S2, no rubs, no gallops Respiratory: bibasilar rales.  No wheeze Abdomen: Soft/+BS, non tender, non distended, no guarding Extremities: No edema, No lymphangitis, No petechiae, No rashes, no synovitis   Data Reviewed: I have personally reviewed following labs and imaging studies Basic Metabolic Panel: Recent Labs  Lab 08/26/22 1659 08/26/22 1659 08/27/22 0741 08/27/22 1712 08/28/22 0753 08/29/22 0436 08/30/22 0123 08/31/22 0150 09/01/22 0319 09/02/22 0434  NA  --    < > 138  --  139  --  137 133* 136 134*  K  --    < > 4.1  --  4.0  --  4.0 4.1 4.0 3.8  CL  --    < > 109  --  110  --  106 108 111 111  CO2  --    < > 22  --  20*  --  21* 17* 17* 18*  GLUCOSE  --    < > 142*  --  101*  --  127* 130* 92 110*  BUN  --    < > 34*  --  34*  --  30* 24* 20 19  CREATININE  --    < > 0.99  --  0.94  --  0.79 0.71 0.66 0.78  CALCIUM  --    < > 8.0*  --  7.9*  --  7.5* 7.5* 8.0* 7.7*  MG 2.1  --  2.0 1.9  --  1.7 1.8  --   --   --   PHOS 2.9  --  2.6 1.9*  --  2.6 2.4* 2.8 3.8  --    < > = values in this interval not displayed.   Liver Function Tests: Recent Labs  Lab 08/31/22 0150  AST 17  ALT 12  ALKPHOS 91  BILITOT 0.5  PROT 4.7*  ALBUMIN 1.5*   No results for input(s): "LIPASE", "AMYLASE" in the last 168 hours. No results for input(s): "AMMONIA" in the last 168 hours. Coagulation Profile: No results for input(s): "INR", "PROTIME" in the last 168 hours. CBC: Recent Labs  Lab 08/29/22 0436 08/30/22 0123 08/31/22 0150 09/01/22 0319 09/02/22  0434  WBC 21.2* 24.5* 20.3* 18.6* 16.2*  HGB 9.9* 9.2* 9.4* 9.7* 8.5*  HCT 32.4* 31.1* 31.3* 32.2* 28.6*  MCV 80.6 82.3 83.0 81.5 81.5  PLT 320 467* 655* 932* 1,044*    Cardiac Enzymes: No results for input(s): "CKTOTAL", "CKMB", "CKMBINDEX", "TROPONINI" in the last 168 hours. BNP: Invalid input(s): "POCBNP" CBG: Recent Labs  Lab 09/01/22 2215 09/02/22 0120 09/02/22 0440 09/02/22 0500 09/02/22 0816  GLUCAP 95 102* 86 74 86   HbA1C: No results for input(s): "HGBA1C" in the last 72 hours. Urine analysis:    Component Value Date/Time   COLORURINE AMBER (A) 08/19/2022 0450   APPEARANCEUR CLOUDY (A) 08/19/2022 0450   LABSPEC 1.031 (H) 08/19/2022 0450   PHURINE 5.0 08/19/2022 0450   GLUCOSEU NEGATIVE 08/19/2022 0450   HGBUR NEGATIVE 08/19/2022 0450   HGBUR negative 11/30/2008 1120   BILIRUBINUR NEGATIVE 08/19/2022 0450   KETONESUR 5 (A) 08/19/2022 0450   PROTEINUR 100 (A) 08/19/2022 0450   UROBILINOGEN 1.0 10/09/2009 2224   NITRITE POSITIVE (A) 08/19/2022 0450   LEUKOCYTESUR MODERATE (A) 08/19/2022 0450   Sepsis Labs: @LABRCNTIP (procalcitonin:4,lacticidven:4) ) Recent Results (from the past 240 hour(s))  Body fluid culture w Gram Stain     Status: None (Preliminary result)   Collection Time: 08/31/22  5:03 PM   Specimen: Urine, Random; Body Fluid  Result Value Ref Range Status   Specimen Description URINE, RANDOM  Final   Special Requests Normal  Final   Gram Stain   Final    WBC PRESENT, PREDOMINANTLY MONONUCLEAR NO ORGANISMS SEEN CYTOSPIN SMEAR    Culture   Final    NO GROWTH 2 DAYS Performed at [facility], [address], [place], [place] [address]    Report Status PENDING  Incomplete     Scheduled Meds:  acetaminophen (TYLENOL) oral liquid 160 mg/5 mL  650 mg Per Tube Q6H   arformoterol  15 mcg Nebulization BID   atorvastatin  20 mg Per Tube Daily   budesonide (PULMICORT) nebulizer solution  0.5 mg Nebulization BID   Chlorhexidine Gluconate Cloth  6 each Topical Daily   feeding supplement (PROSource TF20)  60 mL Per Tube Daily   ferrous sulfate  325 mg Oral BID WC   insulin aspart  0-9 Units Subcutaneous Q4H    loperamide  2 mg Oral BID   melatonin  3 mg Oral QHS   multivitamin with minerals  1 tablet Per Tube Daily   pantoprazole (PROTONIX) IV  40 mg Intravenous Q12H   phosphorus  500 mg Per Tube TID   polycarbophil  625 mg Per Tube BID   revefenacin  175 mcg Nebulization Daily   sodium chloride flush  10-40 mL Intracatheter Q12H   tamsulosin  0.4 mg Oral Daily   Continuous Infusions:  sodium chloride Stopped (08/29/22 1855)   ceFEPime (MAXIPIME) IV 2 g (09/02/22 0939)   feeding supplement (VITAL 1.5 CAL) 1,000 mL (09/02/22 0116)   heparin 1,900 Units/hr (09/02/22 0935)   metronidazole 500 mg (09/02/22 0502)   micafungin (MYCAMINE) 100 mg in sodium chloride 0.9 % 100 mL IVPB 100 mg (09/01/22 1123)   promethazine (PHENERGAN) injection (IM or IVPB)      Procedures/Studies: VAS [place] UPPER EXTREMITY VENOUS DUPLEX  Result Date: 09/01/2022 UPPER VENOUS STUDY  Patient Name:  PA TENNANT  Date of Exam:   09/01/2022 Medical Rec #: [id number]      Accession #:    [id number] Date of Birth: Jun 20, 1950  Patient Gender: F Patient Age:   71 years Exam Location:  [facility] Procedure:      VAS [place] UPPER EXTREMITY VENOUS DUPLEX Referring Phys: AMRIT ADHIKARI --------------------------------------------------------------------------------  Indications: edema Anticoagulation: Heparin. Limitations: Body habitus, poor ultrasound/tissue interface and patient positioning. Comparison Study: No previous study. Performing Technologist: McKayla Maag RVT, [place]  Examination Guidelines: A complete evaluation includes B-mode imaging, spectral Doppler, color Doppler, and power Doppler as needed of all accessible portions of each vessel. Bilateral testing is considered an integral part of a complete examination. Limited examinations for reoccurring indications may be performed as noted.  Right Findings: +----------+------------+---------+-----------+----------+--------------+ RIGHT      CompressiblePhasicitySpontaneousProperties   Summary     +----------+------------+---------+-----------+----------+--------------+ Subclavian                                          Not visualized +----------+------------+---------+-----------+----------+--------------+  Left Findings: +----------+------------+---------+-----------+----------+--------------+ LEFT      CompressiblePhasicitySpontaneousProperties   Summary     +----------+------------+---------+-----------+----------+--------------+ IJV           Full       Yes       Yes                             +----------+------------+---------+-----------+----------+--------------+ Subclavian    Full       Yes       Yes                             +----------+------------+---------+-----------+----------+--------------+ Axillary      Full       Yes       Yes                             +----------+------------+---------+-----------+----------+--------------+ Brachial      Full       Yes       Yes                             +----------+------------+---------+-----------+----------+--------------+ Radial        Full                                                 +----------+------------+---------+-----------+----------+--------------+ Ulnar         Full                                     limited     +----------+------------+---------+-----------+----------+--------------+ Cephalic      Full                                                 +----------+------------+---------+-----------+----------+--------------+ Basilic  Not visualized +----------+------------+---------+-----------+----------+--------------+  Summary:  Left: No evidence of deep vein thrombosis in the upper extremity. No evidence of superficial vein thrombosis in the upper extremity. However, unable to visualize the basilic vein.  *See table(s) above for measurements and  observations.  Diagnosing physician: Waverly Ferrari MD Electronically signed by Waverly Ferrari MD on 09/01/2022 at 4:49:19 PM.    Final    [place] EKG SITE RITE  Result Date: 09/01/2022 If Site Rite image not attached, placement could not be confirmed due to current cardiac rhythm.  CT HEAD WO CONTRAST ( )  Result Date: 09/01/2022 CLINICAL DATA:  Delirium EXAM: CT HEAD WITHOUT CONTRAST TECHNIQUE: Contiguous axial images were obtained from the base of the skull through the vertex without intravenous contrast. RADIATION DOSE REDUCTION: This exam was performed according to the departmental dose-optimization program which includes automated exposure control, adjustment of the mA and/or kV according to patient size and/or use of iterative reconstruction technique. COMPARISON:  08/26/2022 FINDINGS: Brain: The study suffers from some motion degradation. Generalized atrophy is again demonstrated. Areas of acute infarction shown by MRI 6 days ago are difficult to appreciate on this CT exam. There is probably some visible low-density in the right watershed distribution superiorly. No evidence of mass effect or hemorrhage. No new insult is identifiable. No hydrocephalus. No extra-axial collection. Vascular: There is atherosclerotic calcification of the major vessels at the base of the brain. Skull: Negative Sinuses/Orbits: Previous FESS. Chronic inflammation of the left division of the sphenoid sinus. No other advanced sinus disease. Orbits negative. Other: None IMPRESSION: No new finding by CT. Areas of acute infarction shown by MRI 6 days ago are difficult to appreciate on this CT exam. There is probably some visible low-density in the right watershed distribution superiorly. No evidence of mass effect or hemorrhage. No new insult is identifiable. Electronically Signed   By: Paulina Fusi M.D.   On: 09/01/2022 13:21   IR NEPHROSTOMY PLACEMENT RIGHT  Result Date: 08/31/2022 INDICATION: 72 year old female with  obstructing pelvic mass and right-sided hydronephrosis. She presents for placement of a percutaneous nephrostomy tube. EXAM: IR NEPHROSTOMY PLACEMENT RIGHT COMPARISON:  CT abdomen/pelvis 08/30/2022 MEDICATIONS: In patient receiving multiple intravenous antibiotics. No additional prophylaxis was administered. ANESTHESIA/SEDATION: Fentanyl 25 mcg IV; Versed 1 mg IV Moderate Sedation Time:  14 minutes The patient's vital signs and level of consciousness were continuously monitored during the procedure by the interventional radiology nurse under my direct supervision. CONTRAST:  7 mL OMNIPAQUE IOHEXOL 300 MG/ML SOLN - administered into the collecting system(s) FLUOROSCOPY: Radiation exposure index: 2.2 mGy reference air kerma COMPLICATIONS: None immediate. TECHNIQUE: The procedure, risks, benefits, and alternatives were explained to the patient. Questions regarding the procedure were encouraged and answered. The patient understands and consents to the procedure. The right flank was prepped with chlorhexidine in a sterile fashion, and a sterile drape was applied covering the operative field. A sterile gown and sterile gloves were used for the procedure. Local anesthesia was provided with 1% Lidocaine. The right flank was interrogated with ultrasound and the left kidney identified. The kidney is hydronephrotic. A suitable access site on the skin overlying the lower pole, posterior calix was identified. After local mg anesthesia was achieved, a small skin nick was made with an 11 blade scalpel. A 21 gauge Accustick needle was then advanced under direct sonographic guidance into the lower pole of the right kidney. A 0.018 inch wire was advanced under fluoroscopic guidance into the left renal collecting system. The Accustick sheath was then advanced  over the wire and a 0.018 system exchanged for a 0.035 system. Gentle hand injection of contrast material confirms placement of the sheath within the renal collecting system.  There is significant hydronephrosis. The tract from the scan into the renal collecting system was then dilated serially to 10-French. A 10-French Cook all-purpose drain was then placed and positioned under fluoroscopic guidance. The locking loop is well formed within the left renal pelvis. The catheter was secured to the skin with 2-0 Prolene and a sterile bandage was placed. Catheter was left to gravity bag drainage. IMPRESSION: Successful placement of a right 10 French percutaneous nephrostomy tube. A sample of aspirated urine was sent for Gram stain and culture. Electronically Signed   By: Malachy Moan M.D.   On: 08/31/2022 17:11   CT ABDOMEN PELVIS W CONTRAST  Result Date: 08/30/2022 CLINICAL DATA:  Abdominal pain. Right hemicolectomy and ileostomy 8 days ago. Now with feculent drainage EXAM: CT ABDOMEN AND PELVIS WITH CONTRAST TECHNIQUE: Multidetector CT imaging of the abdomen and pelvis was performed using the standard protocol following bolus administration of intravenous contrast. RADIATION DOSE REDUCTION: This exam was performed according to the departmental dose-optimization program which includes automated exposure control, adjustment of the mA and/or kV according to patient size and/or use of iterative reconstruction technique. CONTRAST:  75mL OMNIPAQUE IOHEXOL 350 MG/ML SOLN COMPARISON:  08/22/2022, 08/19/2022 FINDINGS: Lower chest: Small bilateral pleural effusions, left greater than right. Effusions have increased in size from prior. Associated compressive atelectasis. Hepatobiliary: Liver is enlarged measuring approximately 21 cm in length. No focal liver lesion identified. Gallbladder within normal limits. Pancreas: Unremarkable. No pancreatic ductal dilatation or surrounding inflammatory changes. Spleen: Enlarged spleen with new, near-complete hypoattenuation of the splenic parenchyma. There also appears to be subcapsular low-density fluid along the peripheral margin of the spleen.  Adrenals/Urinary Tract: Unremarkable adrenal glands. Severe right hydroureteronephrosis. Layering high attenuation material within the right renal collecting system is likely related to contrast excretion. New mild left hydronephrosis. Urinary bladder within normal limits. Stomach/Bowel: Enteric tube extends into the stomach with distal tip terminating at the level of the duodenal bulb. Interval partial right hemicolectomy with right lower quadrant ostomy. Multiple mildly dilated small bowel loops measuring up to 4.4 cm in diameter. Numerous air-fluid levels. Enteric contrast has partially traversed small bowel. Vascular/Lymphatic: Aortic atherosclerosis. No definite lymphadenopathy. Reproductive: Large calcified uterine fibroid. Ovaries are not definitively seen. Other: Midline surgical incision site. Surgical drain is present within the pelvis with distal tip terminating in the lower right hemiabdomen. Small volume fluid within the abdomen and pelvis with several collections demonstrating partial rim enhancement. For example, collection within the lower right pericolic gutter measures approximately 5.2 x 3.5 x 4.1 cm (series 3, image 58). Collection along the anterior margin of the uterine fundus measures approximately 7.2 x 1.8 x 5.1 cm (series 3, image 69). Small amount of air is noted along the course of the surgical drain. No significant volume of pneumoperitoneum. Large complex mass occupying the majority of the low right pelvis is grossly unchanged. Musculoskeletal: No acute osseous abnormality. Increased body wall edema. IMPRESSION: 1. Interval postsurgical changes to the abdomen and pelvis including partial right hemicolectomy with right lower quadrant ostomy. 2. Small volume fluid within the abdomen and pelvis with several collections demonstrating rim-enhancement suggestive of abscesses. 3. New, near-complete hypoattenuation of the splenic parenchyma with subcapsular low-density fluid along the  peripheral margin of the spleen. Findings are concerning for global splenic infarct. 4. Multiple mildly dilated small bowel loops with  numerous air-fluid levels. Findings are favored to represent postoperative ileus. 5. Persistent severe right hydroureteronephrosis. New mild left hydronephrosis. 6. Small bilateral pleural effusions, left greater than right. Effusions have increased in size from prior. 7. Large complex mass occupying the majority of the low right pelvis is grossly unchanged. 8. Hepatomegaly. 9. Aortic atherosclerosis (ICD10-I70.0). These results will be called to the ordering clinician or representative by the Radiologist Assistant, and communication documented in the PACS or [employer]. Electronically Signed   By: Duanne Guess D.O.   On: 08/30/2022 15:05   VAS [place] LOWER EXTREMITY VENOUS (DVT)  Result Date: 08/28/2022  Lower Venous DVT Study Patient Name:  LAKELYN STRAUS  Date of Exam:   08/27/2022 Medical Rec #: [id number]      Accession #:    [id number] Date of Birth: [birth year]      Patient Gender: F Patient Age:   21 years Exam Location:  [facility] Procedure:      VAS [place] LOWER EXTREMITY VENOUS (DVT) Referring Phys: Scheryl Marten XU --------------------------------------------------------------------------------  Indications: Hypercoagulable state.  Limitations: Body habitus and poor ultrasound/tissue interface. Comparison Study: No previous exams Performing Technologist: Jody Hill RVT, RDMS  Examination Guidelines: A complete evaluation includes B-mode imaging, spectral Doppler, color Doppler, and power Doppler as needed of all accessible portions of each vessel. Bilateral testing is considered an integral part of a complete examination. Limited examinations for reoccurring indications may be performed as noted. The reflux portion of the exam is performed with the patient in reverse Trendelenburg.  +---------+---------------+---------+-----------+----------+-------------------+  RIGHT    CompressibilityPhasicitySpontaneityPropertiesThrombus Aging      +---------+---------------+---------+-----------+----------+-------------------+ CFV      Full           Yes      Yes                                      +---------+---------------+---------+-----------+----------+-------------------+ SFJ      Full                                                             +---------+---------------+---------+-----------+----------+-------------------+ FV Prox  Full           Yes      Yes                                      +---------+---------------+---------+-----------+----------+-------------------+ FV Mid   Full           Yes      Yes                  Not well visualized +---------+---------------+---------+-----------+----------+-------------------+ FV DistalFull           Yes      Yes                                      +---------+---------------+---------+-----------+----------+-------------------+ PFV                     Yes      Yes                                      +---------+---------------+---------+-----------+----------+-------------------+  POP      Full           Yes      Yes                                      +---------+---------------+---------+-----------+----------+-------------------+ PTV                                                   Not visualized      +---------+---------------+---------+-----------+----------+-------------------+ PERO     Full                                         Not well visualized +---------+---------------+---------+-----------+----------+-------------------+   +---------+---------------+---------+-----------+----------+-------------------+ LEFT     CompressibilityPhasicitySpontaneityPropertiesThrombus Aging      +---------+---------------+---------+-----------+----------+-------------------+ CFV      Full           Yes      Yes                                       +---------+---------------+---------+-----------+----------+-------------------+ SFJ      Full                                                             +---------+---------------+---------+-----------+----------+-------------------+ FV Prox  Full           Yes      Yes                                      +---------+---------------+---------+-----------+----------+-------------------+ FV Mid   Full           Yes      Yes                  Not well visualized +---------+---------------+---------+-----------+----------+-------------------+ FV DistalFull           Yes      Yes                                      +---------+---------------+---------+-----------+----------+-------------------+ PFV                     Yes      Yes                                      +---------+---------------+---------+-----------+----------+-------------------+ POP      Full           Yes      Yes                                      +---------+---------------+---------+-----------+----------+-------------------+  PTV                                                   Not visualized      +---------+---------------+---------+-----------+----------+-------------------+ PERO                                                  Not visualized      +---------+---------------+---------+-----------+----------+-------------------+     Summary: BILATERAL: -No evidence of popliteal cyst, bilaterally. RIGHT: - There is no evidence of deep vein thrombosis in the lower extremity. However, portions of this examination were limited- see technologist comments above.  LEFT: - There is no evidence of deep vein thrombosis in the lower extremity. However, portions of this examination were limited- see technologist comments above.  *See table(s) above for measurements and observations. Electronically signed by Gerarda Fraction on 08/28/2022 at 5:34:47 PM.    Final    CT Angio Chest Pulmonary Embolism (PE) W  or WO Contrast  Result Date: 08/27/2022 CLINICAL DATA:  Pulmonary embolism (PE) suspected, high prob Weakness and shortness of breath. EXAM: CT ANGIOGRAPHY CHEST WITH CONTRAST TECHNIQUE: Multidetector CT imaging of the chest was performed using the standard protocol during bolus administration of intravenous contrast. Multiplanar CT image reconstructions and MIPs were obtained to evaluate the vascular anatomy. RADIATION DOSE REDUCTION: This exam was performed according to the departmental dose-optimization program which includes automated exposure control, adjustment of the mA and/or kV according to patient size and/or use of iterative reconstruction technique. CONTRAST:  75mL OMNIPAQUE IOHEXOL 350 MG/ML SOLN COMPARISON:  Radiograph 08/23/2022 FINDINGS: Cardiovascular: Positive for small volume acute pulmonary embolus, involving a subsegmental branch in the right upper lobe, for example series 7, image 99. Small amount of thrombus within right lower lobar pulmonary artery series 7, image 141, extending into the segmental and subsegmental branches. Thromboembolic burden is small to moderate. There is no right heart strain. Moderate calcified as well as noncalcified atheromatous plaque throughout the thoracic aorta, including rounded plaque in the descending aorta series 5, images 84 and 106. No aortic aneurysm. There are coronary artery calcifications. Heart size upper normal. Suggestion of lipomatous hypertrophy of the interatrial septum. Small hiatal hernia. Mediastinum/Nodes: Enteric tube tip in the stomach. Well-circumscribed fluid density structure in the anterior mediastinum measures 2.1 x 2.1 cm series 5, image 57. Well-defined rounded fat density structures in the left suprahilar region, Hounsfield units of -124, 18 and 7 mm series 5, image 57. This appears to be contiguous with the mediastinal fat. Lungs/Pleura: Small to moderate left and small right pleural effusion. Associated compressive atelectasis. No  definite pulmonary infarct. 3-4 mm subpleural right upper lobe nodule has mild surrounding ground-glass, series 6, image 55. No endobronchial debris. Upper Abdomen: Small amount of free fluid in the left upper quadrant. Musculoskeletal: There are no acute or suspicious osseous abnormalities. Review of the MIP images confirms the above findings. IMPRESSION: 1. Positive for acute pulmonary embolus, involving a subsegmental branch in the right upper lobe and right lower lobar pulmonary artery. Thromboembolic burden is small to moderate. No right heart strain. 2. Small to moderate left and small right pleural effusions with compressive atelectasis. 3. Well-circumscribed fluid density structure in the anterior mediastinum measuring 2.1  x 2.1 cm. This may represent a thymic cyst. Recommend MRI for further assessment on an elective basis after resolution of acute event. 4. There is also a well-defined fat density structure in the left suprahilar region which appears to be contiguous with the mediastinal fat. Etiology is uncertain. 5. A 3-4 mm subpleural right upper lobe nodule has mild surrounding ground-glass. This is likely infectious or inflammatory. Per Fleischner Society Guidelines, no routine follow-up imaging is recommended. These guidelines do not apply to immunocompromised patients and patients with cancer. Follow up in patients with significant comorbidities as clinically warranted. For lung cancer screening, adhere to Lung-RADS guidelines. Reference: Radiology. 2017; 284(1):228-43. 6. Moderate calcified as well as noncalcified atheromatous plaque throughout the thoracic aorta, including rounded plaque in the descending aorta. 7. Aortic Atherosclerosis (ICD10-I70.0). Coronary artery calcifications. Critical Value/emergent results were called by telephone at the time of interpretation on 08/27/2022 at 6:56 PM to provider Karie Fetch , who verbally acknowledged these results. Electronically Signed   By: Narda Rutherford M.D.   On: 08/27/2022 19:02   US RENAL  Result Date: 08/27/2022 CLINICAL DATA:  Hydronephrosis EXAM: RENAL / URINARY TRACT ULTRASOUND COMPLETE COMPARISON:  CT abdomen pelvis 08/22/2022 FINDINGS: Right Kidney: Renal measurements: 11.1 x 5.8 x 5.4 cm = volume: 181 mL. Diffusely thinned cortex. Unchanged severe hydronephrosis. Left Kidney: Renal measurements: 11.9 x 8.4 x 5.4 cm = volume: 16 mL. Echogenicity within normal limits. No mass or hydronephrosis visualized. Bladder: Not visualized. Other: None. IMPRESSION: Unchanged severe right hydronephrosis. Electronically Signed   By: Acquanetta Belling M.D.   On: 08/27/2022 12:00   CT ANGIO HEAD NECK W WO CM  Result Date: 08/26/2022 CLINICAL DATA:  Follow-up embolic strokes EXAM: CT ANGIOGRAPHY HEAD AND NECK WITH AND WITHOUT CONTRAST TECHNIQUE: Multidetector CT imaging of the head and neck was performed using the standard protocol during bolus administration of intravenous contrast. Multiplanar CT image reconstructions and MIPs were obtained to evaluate the vascular anatomy. Carotid stenosis measurements (when applicable) are obtained utilizing NASCET criteria, using the distal internal carotid diameter as the denominator. RADIATION DOSE REDUCTION: This exam was performed according to the departmental dose-optimization program which includes automated exposure control, adjustment of the mA and/or kV according to patient size and/or use of iterative reconstruction technique. 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 No evidence of progression or hemorrhage. No hydrocephalus or shift. Vascular: See below Skull: No acute or aggressive Sinuses/Orbits: Finding sinonasal polyps. Chronic left sphenoid sinusitis with sclerotic wall thickening. Review of the MIP images confirms the above  findings CTA NECK FINDINGS Aortic arch: Atheromatous plaque with 3 vessel branching. Right carotid system: Mainly calcified atherosclerosis at the bifurcation without stenosis or ulceration. Left carotid system: Mainly calcified atherosclerosis at the bifurcation without stenosis or ulceration. 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 The left vertebral artery is dominant. At the proximal right V2 segment there is hazy hypoenhancement of the right vertebral lumen. No ulceration, beading, or dissection flap. Robust and symmetric flow by the V4 segments. Skeleton: Generalized cervical spine degeneration. Other neck: No acute finding Upper chest: Layering pleural effusions. Left IJ line with tip at the SVC. An enteric tube traverses the esophagus. Review of the MIP images confirms the above findings CTA HEAD FINDINGS Anterior circulation: Atheromatous plaque is mild. No  major branch occlusion, beading, or irregularity. Hypoplastic right A1 segment. Negative for aneurysm. Posterior circulation: Left dominant vertebral artery. The vertebral and basilar arteries are smoothly contoured and diffusely patent. Mild atheromatous irregularity of the posterior cerebral arteries. Venous sinuses: Unremarkable Anatomic variants: None significant Review of the MIP images confirms the above findings IMPRESSION: 1. No emergent arterial finding. 2. 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 3. Short segment of right V2 hypoenhancement without discrete underlying stenosis or dissection flap. 4. No flow limiting stenosis in the anterior circulation. Electronically Signed   By: Tiburcio Pea M.D.   On: 08/26/2022 20:24   DG Abd Portable 1V  Result Date: 08/26/2022 CLINICAL DATA:  Feeding tube placement. EXAM: PORTABLE ABDOMEN - 1 VIEW COMPARISON:  August 25, 2022. FINDINGS: Distal tip of feeding tube is seen in expected position of distal  stomach. IMPRESSION: Distal tip of feeding tube is seen in expected position of distal stomach. Electronically Signed   By: Lupita Raider M.D.   On: 08/26/2022 13:34   MR BRAIN WO CONTRAST  Result Date: 08/26/2022 CLINICAL DATA:  Neuro deficit, acute, stroke suspected neuro change, code stroke at 1830. EXAM: MRI HEAD WITHOUT CONTRAST TECHNIQUE: Multiplanar, multiecho pulse sequences of the brain and surrounding structures were obtained without intravenous contrast. COMPARISON:  Head CT 08/24/2022.  MRI brain 08/20/2022, 08/18/2022. FINDINGS: Brain: Cortical restricted diffusion along the right-greater-than-left superior frontal sulci, as well as in the right parietal lobe and right occipital lobe, with associated edema and partial sulcal effacement, consistent with late acute infarction. Few foci of acute infarction in the left occipital lobe and left cerebellar hemisphere. No acute hemorrhage or significant mass effect. No hydrocephalus or extra-axial collection. Vascular: Normal flow voids. Skull and upper cervical spine: Normal marrow signal. Sinuses/Orbits: Unremarkable. Other: None. IMPRESSION: 1. 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 2. No acute hemorrhage or significant mass effect. Electronically Signed   By: Orvan Falconer M.D.   On: 08/26/2022 12:53   DG Abd 1 View  Result Date: 08/25/2022 CLINICAL DATA:  [id number] Encounter for imaging study to confirm orogastric (OG) tube placement [id number] EXAM: ABDOMEN - 1 VIEW COMPARISON:  08/22/2022 FINDINGS: Enteric tube terminates within the gastric antrum. There is a small amount of contrast within the gastric fundus. Included bowel gas pattern is nonobstructive. Overlying monitoring leads. IMPRESSION: Enteric tube terminates within the gastric antrum. Electronically Signed   By: Duanne Guess D.O.   On: 08/25/2022 10:54   EEG adult  Result Date: 08/25/2022 Charlsie Quest, MD      08/25/2022  9:09 AM Patient Name: Ahrianna Siglin MRN: [id number] Epilepsy Attending: Charlsie Quest Referring Physician/Provider: Caryl Pina, MD Date: 08/25/2022 Duration: 26.05 mins Patient history: 72 y.o. female with left side weakness. EEG to evaluate for seizure. Level of alertness: Awake AEDs during EEG study: None Technical aspects: This EEG study was done with scalp electrodes positioned according to the 10-20 International system of electrode placement. Electrical activity was reviewed with band pass filter of 1-70Hz , sensitivity of 7 uV/mm, display speed of 72mm/sec with a 60Hz  notched filter applied as appropriate. EEG data were recorded continuously and digitally stored.  Video monitoring was available and reviewed as appropriate. Description: No clear posterior dominant rhythm was seen. EEG showed continuous generalized 3 to 6 Hz theta-delta slowing, at times with triphasic morphology.  Hyperventilation and photic stimulation were not performed.   ABNORMALITY - Continuous slow, generalized IMPRESSION: This study  is suggestive of moderate diffuse encephalopathy, nonspecific etiology but likely related to toxic-metabolic causes. No seizures or definite epileptiform discharges were seen throughout the recording. [name]   CT HEAD WO CONTRAST ( )  Result Date: 08/24/2022 CLINICAL DATA:  Neuro deficit, acute, stroke suspected New left sided weakness. EXAM: CT HEAD WITHOUT CONTRAST TECHNIQUE: Contiguous axial images were obtained from the base of the skull through the vertex without intravenous contrast. RADIATION DOSE REDUCTION: This exam was performed according to the departmental dose-optimization program which includes automated exposure control, adjustment of the mA and/or kV according to patient size and/or use of iterative reconstruction technique. COMPARISON:  08/18/2022 FINDINGS: Brain: There is questionable loss of gray-white differentiation in the high right parietal lobe on images 20  6-28. Cannot exclude acute infarct. The previously seen areas of abnormal punctate diffusion signal on prior MRI not appreciable by CT. There is atrophy and chronic small vessel disease changes. No hydrocephalus or hemorrhage. Vascular: No hyperdense vessel or unexpected calcification. Skull: No acute calvarial abnormality. Sinuses/Orbits: Mucosal thickening diffusely, most pronounced in the left frontal sinus, stable. No air-fluid levels. Other: None IMPRESSION: Questionable area of loss of gray-white differentiation in the high right parietal lobe concerning for area of acute infarction. This could be further evaluated with MRI. Atrophy, chronic small vessel disease. Chronic sinusitis. Electronically Signed   By: Charlett Nose M.D.   On: 08/24/2022 18:06   DG CHEST PORT 1 VIEW  Result Date: 08/23/2022 CLINICAL DATA:  Central line care. EXAM: PORTABLE CHEST 1 VIEW COMPARISON:  08/22/2022. FINDINGS: The heart is enlarged and the mediastinal contour stable. There is a small left pleural effusion with atelectasis at the left lung base. No pneumothorax. The distal tip of the left internal jugular central venous catheter terminates over the right atrium. An enteric tube courses over the stomach and out of the field of view. An endotracheal tube terminates 5.3 cm above the carina. IMPRESSION: 1. Small left pleural effusion with atelectasis at the left lung base. 2. Left internal jugular central venous catheter terminates over the right atrium. 3. Remaining support apparatus as described above. Electronically Signed   By: Thornell Sartorius M.D.   On: 08/23/2022 03:54   DG Chest Port 1 View  Result Date: 08/22/2022 CLINICAL DATA:  Endotracheal tube. EXAM: PORTABLE CHEST 1 VIEW COMPARISON:  04/24/2022. FINDINGS: The heart size and mediastinal contours are within normal limits. Strandy atelectasis or infiltrate is present at the left lung base. There is a small left pleural effusion. The right lung is clear. No pneumothorax  bilaterally. An enteric tube courses over the upper abdomen and out of the field of view. The endotracheal tube terminates 4.8 cm above the carina. IMPRESSION: 1. Small left pleural effusion with atelectasis or infiltrate at the left lung base. 2. Support apparatus as described above. Electronically Signed   By: Thornell Sartorius M.D.   On: 08/22/2022 21:28   CT ABDOMEN PELVIS W CONTRAST  Addendum Date: 08/22/2022   ADDENDUM REPORT: 08/22/2022 16:51 ADDENDUM: Critical Value/emergent results were called by telephone at the time of interpretation on 08/22/2022 at 4:39 pm to provider PAULA GUENTHER , who verbally acknowledged these results. Electronically Signed   By: Genevive Bi M.D.   On: 08/22/2022 16:51   Result Date: 08/22/2022 CLINICAL DATA:  Bowel obstruction. EXAM: CT ABDOMEN AND PELVIS WITH CONTRAST TECHNIQUE: Multidetector CT imaging of the abdomen and pelvis was performed using the standard protocol following bolus administration of intravenous contrast. RADIATION DOSE REDUCTION: This exam  was performed according to the departmental dose-optimization program which includes automated exposure control, adjustment of the mA and/or kV according to patient size and/or use of iterative reconstruction technique. CONTRAST:  75mL OMNIPAQUE IOHEXOL 350 MG/ML SOLN COMPARISON:  CT 08/19/2022 com plain film 08/22/2018 FINDINGS: Lower chest: Small bilateral pleural effusions. Hepatobiliary: No focal hepatic lesion. Normal gallbladder. No biliary duct dilatation. Common bile duct is normal. Pancreas: Pancreas is normal. No ductal dilatation. No pancreatic inflammation. Spleen: Normal spleen Adrenals/urinary tract: Adrenal glands normal. Severe hydronephrosis of the RIGHT kidney. Severe hydroureter on the RIGHT. Findings not changed from comparison exam. Hydroureter extends in the pelvis. LEFT kidney normal. Stomach/Bowel: NG tube in stomach. This fluid within the distal esophagus. The stomach is nondistended. Proximal  small bowel is normal caliber. Again demonstrated marked distension of the colon. There is new intraperitoneal free air noted anterior to the liver (image 12/3). Scattered free air in the falciform ligament (image 14/3). Small amount of free air noted in the ventral peritoneal space of the mid abdomen. Minimal free fluid in the abdomen pelvis. No clear source of bowel perforation of presumably from the colon. Vascular/Lymphatic: Abdominal aorta is normal caliber. No periportal or retroperitoneal adenopathy. No pelvic adenopathy. Reproductive: Calcified enlarged uterine fibroid. Complex mass in the RIGHT adnexa measuring 8.5 by 6.7 cm. Other: No free fluid. Musculoskeletal: No aggressive osseous lesion. IMPRESSION: 1. New intraperitoneal free air. Presumed perforation of the colon. No clear site of perforation identified. Minimal intraperitoneal free fluid. 2. Fluid within the esophagus presumably related to bowel obstruction. NG tube in stomach. 3. Proximal small bowel is nondistended. 4. Colon is markedly distended with air-fluid levels. 5. Severe hydronephrosis of the RIGHT kidney and RIGHT hydroureter. Potential obstructing RIGHT adnexal mass. Electronically Signed: By: Genevive Bi M.D. On: 08/22/2022 16:29   DG Abd 1 View  Result Date: 08/22/2022 CLINICAL DATA:  Bowel obstruction. EXAM: ABDOMEN - 1 VIEW COMPARISON:  Radiograph yesterday, CT 08/19/2022 FINDINGS: Gaseous bowel distension, colonic distention on recent CT, without significant interval change. Multiple ingested pills in the right abdomen. Calcified uterine fibroid as before. CT is pending at this time. IMPRESSION: Gaseous bowel distension, colonic distention on recent CT, without significant interval change. Electronically Signed   By: Narda Rutherford M.D.   On: 08/22/2022 15:58   DG Abd 1 View  Result Date: 08/22/2022 CLINICAL DATA:  Nasogastric tube placement.  Bowel obstruction. EXAM: ABDOMEN - 1 VIEW COMPARISON:  Earlier today  FINDINGS: Tip and side port of the enteric tube below the diaphragm in the stomach. Gaseous bowel distention in the upper abdomen. IMPRESSION: Tip and side port of the enteric tube below the diaphragm in the stomach. Electronically Signed   By: Narda Rutherford M.D.   On: 08/22/2022 15:57   VAS [place] TRANSCRANIAL DOPPLER  Result Date: 08/21/2022  Transcranial Doppler Patient Name:  SOPHINA MITTEN  Date of Exam:   08/21/2022 Medical Rec #: [id number]      Accession #:    [id number] Date of Birth: [birth year]      Patient Gender: F Patient Age:   71 years Exam Location:  [facility] Procedure:      VAS [place] TRANSCRANIAL DOPPLER Referring Phys: Osvaldo Shipper --------------------------------------------------------------------------------  Indications: Stroke. Comparison Study: No prior studies. Performing Technologist: Jean Rosenthal RDMS, RVT  Examination Guidelines: A complete evaluation includes B-mode imaging, spectral Doppler, color Doppler, and power Doppler as needed of all accessible portions of each vessel. Bilateral testing is considered an integral part of a complete  examination. Limited examinations for reoccurring indications may be performed as noted.  +----------+---------------+----------+-----------+------------------+ RIGHT TCD Right VM (cm/s)Depth (cm)Pulsatility     Comment       +----------+---------------+----------+-----------+------------------+ MCA             79                    1.17                       +----------+---------------+----------+-----------+------------------+ ACA             -36                   1.13                       +----------+---------------+----------+-----------+------------------+ Term ICA        53                    1.11                       +----------+---------------+----------+-----------+------------------+ PCA P1          -15                   0.46                        +----------+---------------+----------+-----------+------------------+ Opthalmic       26                    1.81                       +----------+---------------+----------+-----------+------------------+ ICA siphon      63                    1.30                       +----------+---------------+----------+-----------+------------------+ Vertebral                                     Bidirectional flow +----------+---------------+----------+-----------+------------------+  +----------+--------------+----------+-----------+------------------+ LEFT TCD  Left VM (cm/s)Depth (cm)Pulsatility     Comment       +----------+--------------+----------+-----------+------------------+ MCA            101                   1.21                       +----------+--------------+----------+-----------+------------------+ ACA                                          Unable to insonate +----------+--------------+----------+-----------+------------------+ Term ICA        26                   1.45                       +----------+--------------+----------+-----------+------------------+ PCA P1          33                   1.04                       +----------+--------------+----------+-----------+------------------+  Opthalmic       23                   1.92                       +----------+--------------+----------+-----------+------------------+ ICA siphon      45                   1.34                       +----------+--------------+----------+-----------+------------------+ Vertebral      -32                   1.29                       +----------+--------------+----------+-----------+------------------+  +------------+-------+-------+             VM cm/sComment +------------+-------+-------+ Prox Basilar  -43          +------------+-------+-------+ Dist Basilar  -11          +------------+-------+-------+ Summary:  Elevated left middle cerebral  artery mean flow velocities suggest mild stenosis.Mildy elevated right middle cerebral artery mean flow velcoities of unclear significance *See table(s) above for TCD measurements and observations.  Diagnosing physician: Delia Heady MD Electronically signed by Delia Heady MD on 08/21/2022 at 8:01:56 PM.    Final    VAS US CAROTID  Result Date: 08/21/2022 Carotid Arterial Duplex Study Patient Name:  HAELY LEYLAND  Date of Exam:   08/21/2022 Medical Rec #: [id number]      Accession #:    [id number] Date of Birth: 18-Apr-1950      Patient Gender: F Patient Age:   52 years Exam Location:  [facility] Procedure:      VAS US CAROTID Referring Phys: Osvaldo Shipper --------------------------------------------------------------------------------  Indications:       CVA. Risk Factors:      Hypertension, hyperlipidemia. Comparison Study:  No prior studies. Performing Technologist: Jean Rosenthal RDMS, RVT  Examination Guidelines: A complete evaluation includes B-mode imaging, spectral Doppler, color Doppler, and power Doppler as needed of all accessible portions of each vessel. Bilateral testing is considered an integral part of a complete examination. Limited examinations for reoccurring indications may be performed as noted.  Right Carotid Findings: +----------+--------+--------+--------+------------------+---------+           PSV cm/sEDV cm/sStenosisPlaque DescriptionComments  +----------+--------+--------+--------+------------------+---------+ CCA Prox  185     18                                Turbulent +----------+--------+--------+--------+------------------+---------+ CCA Distal129     22                                          +----------+--------+--------+--------+------------------+---------+ ICA Prox  147     31      1-39%   calcific          tortuous  +----------+--------+--------+--------+------------------+---------+ ICA Distal118     27                                           +----------+--------+--------+--------+------------------+---------+ ECA  111     16                                          +----------+--------+--------+--------+------------------+---------+ +----------+--------+-------+--------+-------------------+           PSV cm/sEDV cmsDescribeArm Pressure (mmHG) +----------+--------+-------+--------+-------------------+ [id number]            Stenotic                    +----------+--------+-------+--------+-------------------+ +---------+--------+--+--------+---------------+ VertebralPSV cm/s21EDV cm/sBi- directional +---------+--------+--+--------+---------------+  Left Carotid Findings: +----------+--------+--------+--------+------------------+--------+           PSV cm/sEDV cm/sStenosisPlaque DescriptionComments +----------+--------+--------+--------+------------------+--------+ CCA Prox  67      16                                         +----------+--------+--------+--------+------------------+--------+ CCA Distal77      17                                         +----------+--------+--------+--------+------------------+--------+ ICA Prox  89      25      1-39%   calcific                   +----------+--------+--------+--------+------------------+--------+ ICA Distal95      31                                         +----------+--------+--------+--------+------------------+--------+ ECA       80                                                 +----------+--------+--------+--------+------------------+--------+ +----------+--------+--------+----------------+-------------------+           PSV cm/sEDV cm/sDescribe        Arm Pressure (mmHG) +----------+--------+--------+----------------+-------------------+ [id number]              Multiphasic, WNL                    +----------+--------+--------+----------------+-------------------+ +---------+--------+--+--------+--+---------+  VertebralPSV cm/s74EDV cm/s14Antegrade +---------+--------+--+--------+--+---------+   Summary: Right Carotid: Velocities in the right ICA are consistent with a 1-39% stenosis. Left Carotid: Velocities in the left ICA are consistent with a 1-39% stenosis. Vertebrals:  Left vertebral artery demonstrates antegrade flow. Right vertebral              artery demonstrates bidirectional flow. Subclavians: Right subclavian artery was stenotic. Normal flow hemodynamics were              seen in the left subclavian artery. *See table(s) above for measurements and observations.  Electronically signed by Delia Heady MD on 08/21/2022 at 7:57:56 PM.    Final    DG Abd Portable 1V  Result Date: 08/21/2022 CLINICAL DATA:  [id number] Nausea and vomiting [id number] EXAM: PORTABLE ABDOMEN - 1 VIEW COMPARISON:  CT 08/19/2022 FINDINGS: Persistent dilated loops of colon. A few gas distended small bowel loops in the left mid abdomen. Calcified uterine fibroid. IMPRESSION: Persistent dilated colon suggesting distal obstruction.  Electronically Signed   By: Corlis Leak M.D.   On: 08/21/2022 12:51   MR BRAIN W CONTRAST  Result Date: 08/20/2022 CLINICAL DATA:  Evaluate for metastatic disease. EXAM: MRI HEAD WITH CONTRAST TECHNIQUE: Multiplanar, multiecho pulse sequences of the brain and surrounding structures were obtained with intravenous contrast. CONTRAST:  7mL GADAVIST GADOBUTROL 1 MMOL/ML IV SOLN COMPARISON:  Noncontrast brain MRI from 2 days ago FINDINGS: Brain: Punctate enhancement in the right frontal parietal white matter on 5:38, associated with a focus of restricted diffusion. No generalized abnormal enhancement. Vascular: Major vessels are enhancing Skull and upper cervical spine: No focal marrow lesion. Sinuses/Orbits: Active bilateral sinusitis with maxillary fluid levels. Complete left frontal sinus opacification. Enhancing polypoid densities in the bilateral nasal cavity which are numerous. IMPRESSION: 1. Single punctate  enhancement in the right frontal parietal white matter, a site of weakly restricted diffusion and compatible with subacute ischemia. If high concern for metastatic disease follow-up in 12 weeks could prove resolution. 2. Active sinusitis with nasal cavity polyps. Electronically Signed   By: Tiburcio Pea M.D.   On: 08/20/2022 12:01   CT ABDOMEN PELVIS W CONTRAST  Result Date: 08/19/2022 CLINICAL DATA:  Hydronephrosis, abnormal gallbladder on ultrasound abdomen distension EXAM: CT ABDOMEN AND PELVIS WITH CONTRAST TECHNIQUE: Multidetector CT imaging of the abdomen and pelvis was performed using the standard protocol following bolus administration of intravenous contrast. RADIATION DOSE REDUCTION: This exam was performed according to the departmental dose-optimization program which includes automated exposure control, adjustment of the mA and/or kV according to patient size and/or use of iterative reconstruction technique. CONTRAST:  75mL OMNIPAQUE IOHEXOL 350 MG/ML SOLN COMPARISON:  Ultrasound 08/18/2022 FINDINGS: Lower chest: Lung bases demonstrate no acute airspace disease. Mild coronary vascular calcification. Borderline cardiomegaly. Small hiatal hernia Hepatobiliary: No calcified gallstone or biliary dilatation. No gas within the gallbladder lumen or gallbladder wall. Pancreas: Unremarkable. No pancreatic ductal dilatation or surrounding inflammatory changes. Spleen: Normal in size without focal abnormality. Adrenals/Urinary Tract: Adrenal glands are within normal limits. Severe right-sided hydronephrosis and hydroureter with dilated ureter visualized to the pelvis. No obstructing stone is seen. No significant excretion of contrast right kidney consistent with decreased renal function and obstruction. Decompressed urinary bladder Stomach/Bowel: Stomach nonenlarged. No dilated small bowel. Fluid and air distension of the colon, measuring up to 9.3 cm maximum at the right colon. Decompressed rectum with small  volume stool. Unopacified bowel in the pelvis. Poorly identified sigmoid colon with suspicion of possible sigmoid colon mass in the right pelvis, series 3, image 70 but limited due to absence of enteral contrast. Vascular/Lymphatic: Moderate aortic atherosclerosis. No aneurysm. No suspicious lymph nodes. Reproductive: Enlarged uterus with large calcified uterine mass measuring 9 by 8.7 cm consistent with large calcified fibroid. As mentioned previously, ill-defined hypodense area in the right hemipelvis. Other: Negative for free air.  Trace volume fluid in the pelvis Musculoskeletal: Grade 1 anterolisthesis L4 on L5. No acute osseous abnormality IMPRESSION: 1. Marked fluid and air distension of the majority of colon with decompressed rectum and poorly delineated sigmoid colon. Ill-defined hypodensity/suspected mass within the right pelvis with central gas collection. Findings are suspect for colon obstruction, potentially due to sigmoid colon mass, suggest correlation with colonoscopy. There is additional finding of severe right-sided hydronephrosis and hydroureter with dilated ureter visualized to the level of right pelvic ill-defined hypodensity, consistent with distal ureteral obstruction by right pelvic mass. Other consideration for the right pelvic mass could include gynecologic malignancy. Trace free fluid in the pelvis. No free  air. 2. Negative for gallstones or air within the gallbladder as questioned on prior ultrasound 3. Enlarged uterus with large calcified uterine mass consistent with large calcified fibroid. 4. Small hiatal hernia. 5. Aortic atherosclerosis. Aortic Atherosclerosis (ICD10-I70.0). These results will be called to the ordering clinician or representative by the Radiologist Assistant, and communication documented in the PACS or [employer]. Electronically Signed   By: Jasmine Pang M.D.   On: 08/19/2022 17:50   ECHOCARDIOGRAM LIMITED  Result Date: 08/19/2022    ECHOCARDIOGRAM LIMITED  REPORT   Patient Name:   ADY HEIMANN Date of Exam: 08/19/2022 Medical Rec #:  [id number]     Height:       66.0 in Accession #:    [id number]    Weight:       170.0 lb Date of Birth:  12-28-1950     BSA:          1.866 m Patient Age:    72 years      BP:           133/68 mmHg Patient Gender: F             HR:           80 bpm. Exam Location:  Inpatient Procedure: Limited Echo and Color Doppler Indications:    stroke/pericardial effusion  History:        Patient has prior history of Echocardiogram examinations, most                 recent 04/25/2022. CHF, COPD; Risk Factors:Hypertension and Former                 Smoker.  Sonographer:    Melissa Morford RDCS (AE, PE) Referring Phys: [id number] Osvaldo Shipper IMPRESSIONS  1. Left ventricular ejection fraction, by estimation, is 40 to 45%. The left ventricle has mildly decreased function. The left ventricle demonstrates global hypokinesis.  2. Right ventricular systolic function is normal. The right ventricular size is normal.  3. The mitral valve is normal in structure. Mild to moderate mitral valve regurgitation.  4. The aortic valve is tricuspid. Aortic valve regurgitation is not visualized.  5. Trivial pericardial effusion  6. The inferior vena cava is normal in size with greater than 50% respiratory variability, suggesting right atrial pressure of 3 mmHg. FINDINGS  Left Ventricle: Left ventricular ejection fraction, by estimation, is 40 to 45%. The left ventricle has mildly decreased function. The left ventricle demonstrates global hypokinesis. Right Ventricle: The right ventricular size is normal. No increase in right ventricular wall thickness. Right ventricular systolic function is normal. Pericardium: Trivial pericardial effusion is present. Mitral Valve: The mitral valve is normal in structure. Mild to moderate mitral valve regurgitation. Tricuspid Valve: The tricuspid valve is normal in structure. Tricuspid valve regurgitation is trivial. Aortic Valve: The aortic  valve is tricuspid. Aortic valve regurgitation is not visualized. Pulmonic Valve: The pulmonic valve was not well visualized. Pulmonic valve regurgitation is not visualized. Venous: The inferior vena cava is normal in size with greater than 50% respiratory variability, suggesting right atrial pressure of 3 mmHg. IAS/Shunts: The interatrial septum was not well visualized. Epifanio Lesches MD Electronically signed by Epifanio Lesches MD Signature Date/Time: 08/19/2022/1:35:21 PM    Final    MR BRAIN WO CONTRAST  Result Date: 08/18/2022 CLINICAL DATA:  Follow-up examination for stroke. EXAM: MRI HEAD WITHOUT CONTRAST TECHNIQUE: Multiplanar, multiecho pulse sequences of the brain and surrounding structures were obtained without intravenous contrast. COMPARISON:  CT from  earlier the same day. FINDINGS: Brain: Cerebral volume within normal limits. Scattered patchy T2/FLAIR hyperintensity involving the periventricular deep white matter both cerebral hemispheres, consistent with chronic small vessel ischemic disease, mild-to-moderate in nature. Few scattered punctate foci of diffusion signal abnormality are seen involving the right parietal lobe (series 5, image 87), left frontal centrum semi ovale (series 7, image 64) and right cerebellum (series 5, image 64). Findings suspicious for tiny acute ischemic infarcts. No associated hemorrhage or mass effect. No other evidence for acute or subacute ischemia. Gray-white matter differentiation otherwise maintained. No acute or chronic intracranial blood products. No mass lesion, midline shift or mass effect. No hydrocephalus or extra-axial fluid collection. Pituitary gland and suprasellar region within normal limits. Vascular: Major intracranial vascular flow voids are maintained. Skull and upper cervical spine: Craniocervical junction within normal limits. Diffuse loss of normal bone marrow signal, nonspecific but can be seen with anemia, smoking, obesity, and  infiltrative/myelofibrotic marrow processes. No focal marrow replacing lesion. Sinuses/Orbits: Prior bilateral ocular lens replacement. Scattered mucosal thickening and secretions noted about the frontoethmoidal, sphenoid, and maxillary sinuses. Scattered superimposed air-fluid levels. No mastoid effusion. Other: None. IMPRESSION: 1. Few scattered punctate foci of diffusion signal abnormality involving the right parietal lobe, left frontal centrum semi ovale, and right cerebellum, suspicious for tiny acute ischemic infarcts. No associated hemorrhage or mass effect. 2. Underlying mild to moderate chronic microvascular ischemic disease. Electronically Signed   By: Rise Mu M.D.   On: 08/18/2022 20:24   US Abdomen Limited RUQ (LIVER/GB)  Result Date: 08/18/2022 CLINICAL DATA:  Cirrhosis evaluation EXAM: ULTRASOUND ABDOMEN LIMITED RIGHT UPPER QUADRANT COMPARISON:  None Available. FINDINGS: Gallbladder: Probable small amount of gallbladder sludge. Few echogenic foci with possible dirty shadowing. Negative sonographic Murphy. Common bile duct: Diameter: 3.4 mm Liver: Slightly coarse hepatic echotexture. No focal hepatic abnormality. Portal vein is patent on color Doppler imaging with normal direction of blood flow towards the liver. Other: Incidentally noted is moderate severe right hydronephrosis. Cortical thinning of right kidney IMPRESSION: 1. Slightly coarse hepatic echotexture without focal lesion. Possible subtle surface nodularity on some of the views as may be seen with early changes of cirrhosis. 2. Distended gallbladder, appears to contain sludge and echogenic foci some of which exhibit dirty shadowing, question air containing stones. No wall thickening or Murphy. Suggest correlation with CT to exclude air within the gallbladder. 3. Moderate to severe right hydronephrosis with cortical thinning of right kidney. This may also be assessed at CT follow-up. These results will be called to the ordering  clinician or representative by the Radiologist Assistant, and communication documented in the PACS or [employer]. Electronically Signed   By: Jasmine Pang M.D.   On: 08/18/2022 19:42   CT Head Wo Contrast  Result Date: 08/18/2022 CLINICAL DATA:  Weakness since March, worsening over the last week. Decreased mobility. EXAM: CT HEAD WITHOUT CONTRAST TECHNIQUE: Contiguous axial images were obtained from the base of the skull through the vertex without intravenous contrast. RADIATION DOSE REDUCTION: This exam was performed according to the departmental dose-optimization program which includes automated exposure control, adjustment of the mA and/or kV according to patient size and/or use of iterative reconstruction technique. COMPARISON:  None Available. FINDINGS: Brain: There is no acute intracranial hemorrhage, extra-axial fluid collection, or acute territorial infarct. Parenchymal volume is normal for age. The ventricles are normal in size. Gray-white differentiation is preserved. There is mild background chronic small-vessel ischemic change. There an age-indeterminate but favored remote small infarct in the genu of  the corpus callosum. The pituitary and suprasellar region are normal. There is no mass lesion. There is no mass effect or midline shift. Vascular: No hyperdense vessel or unexpected calcification. Skull: Normal. Negative for fracture or focal lesion. Sinuses/Orbits: There is extensive chronic pansinusitis with layering fluid in the maxillary and sphenoid sinuses. Bilateral lens implants are in place. The globes and orbits are otherwise unremarkable. Other: The mastoid air cells and middle ear cavities are clear. IMPRESSION: 1. 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 No other evidence of acute intracranial pathology. 2. Extensive chronic pansinusitis with possible superimposed acute sinusitis. Electronically Signed   By: Lesia Hausen M.D.   On: 08/18/2022  12:12    Catarina Hartshorn, DO  [facility]  If 7PM-7AM, please contact night-coverage [email or web address] Password [email or web address] 09/02/2022, 10:41 AM   LOS: 15 days

## 2022-09-02 NOTE — Progress Notes (Addendum)
ANTICOAGULATION CONSULT NOTE   Pharmacy Consult for heparin  Indication: pulmonary embolus with recent stroke on MRI on 7/10  Allergies  Allergen Reactions   Aspirin Other (See Comments)    Caused an asthma attack   Ibuprofen Swelling and Other (See Comments)    Angioedema and Asthma exacerbation   Sulfonamide Derivatives Swelling and Other (See Comments)    Face swelled SEVERELY   Penicillins Rash    Patient Measurements: Height: 5' 5.98" (167.6 cm) Weight: 78.1 kg (172 lb 2.9 oz) IBW/kg (Calculated) : 59.26 Heparin Dosing Weight: 75 kg  Vital Signs: Temp: 97.8 F (36.6 C) (07/17 1548) Temp Source: Oral (07/17 1548) BP: 143/62 (07/17 1548) Pulse Rate: 90 (07/17 1548)  Labs: Recent Labs    08/31/22 0150 09/01/22 0319 09/01/22 1214 09/02/22 0434 09/02/22 0608 09/02/22 0735 09/02/22 1821  HGB 9.4* 9.7*  --  8.5*  --   --   --   HCT 31.3* 32.2*  --  28.6*  --   --   --   PLT 655* 932*  --  1,044*  --   --   --   HEPARINUNFRC 0.29* <0.10*   < > 0.95* >1.10* >1.10* 0.53  CREATININE 0.71 0.66  --  0.78  --   --   --    < > = values in this interval not displayed.    Estimated Creatinine Clearance: 69 mL/min (by C-G formula based on SCr of 0.78 mg/dL).  Assessment: 72 y.o. female with PE for heparin. Of note: patient with New stroke - Seen on MRI 7/10, suspected thromboembolic.  Heparin level slightly above goal 0.53 on 1900 units/hr. No issues with heparin infusion or signs of bleeding.  Goal of Therapy:  Heparin level 0.3-0.5 units/ml Monitor platelets by anticoagulation protocol: Yes  Plan:  Decrease heparin infusion to 1850 units/hr Check heparin level in 8 hours and daily while on heparin Continue to monitor H&H and platelets  Thank you for allowing pharmacy to be a part of this patient's care.  Thelma Barge, PharmD Clinical Pharmacist

## 2022-09-02 NOTE — Progress Notes (Signed)
Physical Therapy Treatment Patient Details Name: Debra Sharp MRN: [id number] DOB: Aug 25, 1950 Today's Date: 09/02/2022   History of Present Illness 72 yo female admitted 7/2 with generalized weakness, thrombocytosis and anemia. 7/2 MRI scattered punctate ischemic infarcts right parietal, left frontal, right cerebellum. Pt with progression to AKI with septic peritonitis. 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 7/8 extubated. Lt sided weakness noted post extubation with CT demonstrating possible Rt parietal infarct. PMHx: COPD, anemia, CHF, HLD, HTN    PT Comments  Pt received in supine and agreeable to session. Pt more alert and engaged in mobility this session, however continues to be limited by weakness, impaired activity tolerance, and some L inattention. Pt able to demonstrate improved LLE mobility this session with bed mobility and performing LAQ sitting EOB. Pt continues to require mod-max A +2 for mobility tasks. Pt demonstrates improved sitting balance requiring consistent min A to maintain balance, but pt is able to perform anterior lean with cues. Pt requires up to mod A to for sitting balance during seated therex due to core weakness. Pt noted to keep head turned to the R when in supine and requires cues to turn head to the L. Pt continues to benefit from PT services to progress toward functional mobility goals.     Assistance Recommended at Discharge Frequent or constant Supervision/Assistance  If plan is discharge home, recommend the following:  Can travel by private vehicle    Two people to help with walking and/or transfers;Two people to help with bathing/dressing/bathroom;Assistance with cooking/housework;Assistance with feeding;Direct supervision/assist for medications management;Assist for transportation;Direct supervision/assist for financial management   No  Equipment Recommendations  Wheelchair  (measurements PT);Hospital bed;Wheelchair cushion (measurements PT);Other (comment)    Recommendations for Other Services       Precautions / Restrictions Precautions Precautions: Fall;Other (comment) Precaution Comments: cortrak, ostomy, Rt fem line     Mobility  Bed Mobility Overal bed mobility: Needs Assistance Bed Mobility: Supine to Sit, Sit to Supine     Supine to sit: Max assist, +2 for physical assistance Sit to supine: Total assist, +2 for physical assistance   General bed mobility comments: Pt able to advance BLE to EOB with dense cues for step-by-step sequencing. Pt able to grip rail with R hand, however requires max A +2 for all other aspects. Total A +2 to return to supine due to increased fatigue.    Transfers                   General transfer comment: unable       Balance Overall balance assessment: Needs assistance Sitting-balance support: Bilateral upper extremity supported, Feet supported Sitting balance-Leahy Scale: Poor Sitting balance - Comments: Pt requiring constant min A with intermittent mod A to maintain sitting balance. Pt with L lateral lean onto L elbow.                                    Cognition Arousal/Alertness: Awake/alert Behavior During Therapy: Flat affect Overall Cognitive Status: Impaired/Different from baseline                                          Exercises General Exercises - Lower Extremity Long Arc Quad: AROM, Seated, Both, 10 reps    General  Comments        Pertinent Vitals/Pain Pain Assessment Pain Assessment: Faces Faces Pain Scale: Hurts little more Pain Location: generalized with mobility Pain Descriptors / Indicators: Moaning, Sore Pain Intervention(s): Monitored during session, Limited activity within patient's tolerance, Repositioned     PT Goals (current goals can now be found in the care plan section) Acute Rehab PT Goals PT Goal Formulation: Patient unable  to participate in goal setting Time For Goal Achievement: 09/08/22 Potential to Achieve Goals: Fair Progress towards PT goals: Progressing toward goals    Frequency    Min 2X/week      PT Plan Current plan remains appropriate    Co-evaluation PT/OT/SLP Co-Evaluation/Treatment: Yes Reason for Co-Treatment: For patient/therapist safety;To address functional/ADL transfers PT goals addressed during session: Mobility/safety with mobility;Balance;Strengthening/ROM        AM-PAC PT "6 Clicks" Mobility   Outcome Measure  Help needed turning from your back to your side while in a flat bed without using bedrails?: Total Help needed moving from lying on your back to sitting on the side of a flat bed without using bedrails?: Total Help needed moving to and from a bed to a chair (including a wheelchair)?: Total Help needed standing up from a chair using your arms (e.g., wheelchair or bedside chair)?: Total Help needed to walk in hospital room?: Total Help needed climbing 3-5 steps with a railing? : Total 6 Click Score: 6    End of Session   Activity Tolerance: Patient limited by fatigue;Patient limited by pain Patient left: in bed;with call bell/phone within reach Nurse Communication: Mobility status PT Visit Diagnosis: Other abnormalities of gait and mobility (R26.89);Muscle weakness (generalized) (M62.81);Other symptoms and signs involving the nervous system (R29.898);Hemiplegia and hemiparesis     Time: 0951-1026 PT Time Calculation (min) (ACUTE ONLY): 35 min  Charges:    $Therapeutic Activity: 8-22 mins PT General Charges $$ ACUTE PT VISIT: 1 Visit                    Johny Shock, PTA Acute Rehabilitation Services Secure Chat Preferred  [phone]    Johny Shock 09/02/2022, 10:45 AM

## 2022-09-02 NOTE — Progress Notes (Signed)
Speech Language Pathology Treatment: Dysphagia  Patient Details Name: Debra Sharp MRN: [id number] DOB: Jul 04, 1950 Today's Date: 09/02/2022 Time: 1048-1100 SLP Time Calculation (min) (ACUTE ONLY): 12 min  Assessment / Plan / Recommendation Clinical Impression  Pt seen for ongoing dysphagia management.  Pt tolerated thin liquid by straw, puree, and regular solid textures without overt s/s of aspiration.  There was intermittent secondary swallow noted.  Vocal quality clear and strong throughout session.  Pt is eager for POs.  She appears safe to advance to a regular texture diet as medically appropriate.  Spoke with surgery and will initiate CLD at this time. SLP will follow for tolerance.  Recommend clear liquid diet, thin liquids, advancing as tolerated.    HPI HPI: 72 yo female admitted 7/2 with generalized weakness, thrombocytosis and anemia. 7/2 MRI scattered punctate ischemic infarcts right parietal, left frontal, right cerebellum. Pt with progression to AKI with septic peritonitis. 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 7/8 extubated. Lt sided weakness noted post extubation with CT demonstrating possible Rt parietal infarct. PMHx: COPD, GERD, anemia, CHF, HLD, HTN      SLP Plan  Continue with current plan of care      Recommendations for follow up therapy are one component of a multi-disciplinary discharge planning process, led by the attending physician.  Recommendations may be updated based on patient status, additional functional criteria and insurance authorization.    Recommendations  Diet recommendations: Thin liquid (Clear liquid diet, but may advance to regular as tolerated per treatment team) Liquids provided via: Cup;Straw Medication Administration:  (no specific precautions) Supervision: Staff to assist with self feeding Compensations: Slow rate;Small sips/bites Postural Changes and/or  Swallow Maneuvers:  (upright as tolerated)                  Oral care BID     Dysphagia, unspecified (R13.10)     Continue with current plan of care     Kerrie Pleasure, MA, CCC-SLP Acute Rehabilitation Services Office: [phone] 09/02/2022, 11:10 AM

## 2022-09-02 NOTE — Progress Notes (Signed)
ANTICOAGULATION CONSULT NOTE   Pharmacy Consult for heparin  Indication: pulmonary embolus with recent stroke on MRI on 7/10  Allergies  Allergen Reactions   Aspirin Other (See Comments)    Caused an asthma attack   Ibuprofen Swelling and Other (See Comments)    Angioedema and Asthma exacerbation   Sulfonamide Derivatives Swelling and Other (See Comments)    Face swelled SEVERELY   Penicillins Rash    Patient Measurements: Height: 5' 5.98" (167.6 cm) Weight: 78.1 kg (172 lb 2.9 oz) IBW/kg (Calculated) : 59.26 Heparin Dosing Weight: 75 kg  Vital Signs: Temp: 97.9 F (36.6 C) (07/17 0729) Temp Source: Oral (07/17 0729) BP: 166/76 (07/17 0729) Pulse Rate: 87 (07/17 0729)  Labs: Recent Labs    08/31/22 0150 09/01/22 0319 09/01/22 1214 09/02/22 0434 09/02/22 0608 09/02/22 0735  HGB 9.4* 9.7*  --  8.5*  --   --   HCT 31.3* 32.2*  --  28.6*  --   --   PLT 655* 932*  --  1,044*  --   --   HEPARINUNFRC 0.29* <0.10*   < > 0.95* >1.10* >1.10*  CREATININE 0.71 0.66  --  0.78  --   --    < > = values in this interval not displayed.    Estimated Creatinine Clearance: 69 mL/min (by C-G formula based on SCr of 0.78 mg/dL).  Assessment: 72 y.o. female with PE for heparin. Of note: patient with New stroke - Seen on MRI 7/10, suspected thromboembolic.  Heparin level >1.1 (supratherapeutic) on heparin 2150 units/hr. Verified on re-check drawn peripherally - first level drawn from heparin infusion.  Previously patient was therapeutic on heparin 2000 units/hr  Goal of Therapy:  Heparin level 0.3-0.5 units/ml Monitor platelets by anticoagulation protocol: Yes  Plan:  Hold heparin x 1 hour Restart heparin at 1900 units/hr at 1000 Check 8hr heparin level at 1800  Eldridge Scot, PharmD Clinical Pharmacist 09/02/2022, 8:27 AM

## 2022-09-02 NOTE — Progress Notes (Signed)
Occupational Therapy Treatment Patient Details Name: Debra Sharp MRN: [id number] DOB: April 09, 1950 Today's Date: 09/02/2022   History of present illness 72 yo female admitted 7/2 with generalized weakness, thrombocytosis and anemia. 7/2 MRI scattered punctate ischemic infarcts right parietal, left frontal, right cerebellum. Pt with progression to AKI with septic peritonitis. 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 7/8 extubated. Lt sided weakness noted post extubation with CT demonstrating possible Rt parietal infarct. PMHx: COPD, anemia, CHF, HLD, HTN   OT comments  Pt progressing toward established OT goals. Performing grooming at bed level with set-up for RUE tasks. Challenging strength and balance sitting EOB and engaging in BLE exercises. Pt requiring constant multimodal cues and 2 rest breaks due to fatigue and poor core strength. Facilitating weightbearing through LUE at EOB. Provinging P/AAROM to LUE; pt continues with limited activation. Pt continues to benefit from skilled OT services.    Recommendations for follow up therapy are one component of a multi-disciplinary discharge planning process, led by the attending physician.  Recommendations may be updated based on patient status, additional functional criteria and insurance authorization.    Assistance Recommended at Discharge Frequent or constant Supervision/Assistance  Patient can return home with the following  Two people to help with walking and/or transfers;Two people to help with bathing/dressing/bathroom;Assistance with cooking/housework;Direct supervision/assist for medications management;Direct supervision/assist for financial management;Assist for transportation;Help with stairs or ramp for entrance   Equipment Recommendations  Other (comment) (defer)    Recommendations for Other Services      Precautions / Restrictions Precautions Precautions:  Fall;Other (comment) Precaution Comments: cortrak, ostomy       Mobility Bed Mobility Overal bed mobility: Needs Assistance Bed Mobility: Supine to Sit, Sit to Supine     Supine to sit: Max assist, +2 for physical assistance Sit to supine: Total assist, +2 for physical assistance   General bed mobility comments: Pt able to advance BLE to EOB with dense cues for step-by-step sequencing. Pt able to grip rail with R hand, however requires max A +2 for all other aspects. Total A +2 to return to supine due to increased fatigue.    Transfers                   General transfer comment: unable today     Balance Overall balance assessment: Needs assistance Sitting-balance support: Bilateral upper extremity supported, Feet supported Sitting balance-Leahy Scale: Poor Sitting balance - Comments: Pt requiring constant min A cues for upright posture, anterior weight shift, and core engagement, with intermittent mod A to maintain sitting balance. Pt with L lateral lean onto L elbow.                                   ADL either performed or assessed with clinical judgement   ADL Overall ADL's : Needs assistance/impaired Eating/Feeding: NPO   Grooming: Set up;Bed level;Oral care Grooming Details (indicate cue type and reason): Min A to reach up to receive oral swab from therapist requiring shoulder flexion, but pt able to perform oral care with mouth swab with set-up                               General ADL Comments: focus session on sitting blanance, core control, BLE strengthening, and LUE ROM    Extremity/Trunk Assessment Upper Extremity  Assessment Upper Extremity Assessment: RUE deficits/detail;LUE deficits/detail RUE Deficits / Details: Able to reach to face sitting EOB this session. Poor skill to lift shoulder against gravity past ~10 degrees. but good demo at elbow. LUE Deficits / Details: Able to wiggle fingers this session and with trace flexion  at biceps in gravity eliminated.l   Lower Extremity Assessment Lower Extremity Assessment: Defer to PT evaluation        Vision   Vision Assessment?: Vision impaired- to be further tested in functional context Additional Comments: Pt with eyes open this session and with constant preference for R gaze. Able to turn head L on command. Not participating in formal testing   Perception     Praxis      Cognition Arousal/Alertness: Awake/alert Behavior During Therapy: Flat affect Overall Cognitive Status: Impaired/Different from baseline Area of Impairment: Attention, Memory, Following commands, Safety/judgement, Awareness, Problem solving                   Current Attention Level: Focused Memory: Decreased short-term memory Following Commands: Follows one step commands inconsistently, Follows one step commands with increased time Safety/Judgement: Decreased awareness of safety, Decreased awareness of deficits Awareness: Intellectual Problem Solving: Slow processing, Decreased initiation, Difficulty sequencing, Requires verbal cues, Requires tactile cues General Comments: Pt maintaining eyes open this session and following basic commands with incresased time. decreased attention to L side of environment and preference for R head turn. Max internally distracted and with nausea throughout session        Exercises Exercises: Other exercises Other Exercises Other Exercises: PROM shoulder; AAROM eblow/wrist/hand on the L.    Shoulder Instructions       General Comments VSS. Pt reporting she feels "gassy" or nauseated at EOB, but denying dizziness. HR max 125    Pertinent Vitals/ Pain       Pain Assessment Pain Assessment: Faces Faces Pain Scale: Hurts little more Pain Location: generalized with mobility Pain Descriptors / Indicators: Moaning, Sore Pain Intervention(s): Limited activity within patient's tolerance, Monitored during session  Home Living                                           Prior Functioning/Environment              Frequency  Min 2X/week        Progress Toward Goals  OT Goals(current goals can now be found in the care plan section)  Progress towards OT goals: Progressing toward goals  Acute Rehab OT Goals Time For Goal Achievement: 09/09/22 Potential to Achieve Goals: Fair ADL Goals Pt Will Perform Grooming: sitting;with min assist Pt Will Perform Upper Body Bathing: with mod assist;sitting Pt Will Transfer to Toilet: stand pivot transfer;with mod assist;with +2 assist;bedside commode Additional ADL Goal #1: Pt will follow all one step commands with increased time. Additional ADL Goal #2: Pt will perform bed mobility with mod A.  Plan Discharge plan remains appropriate;Frequency remains appropriate    Co-evaluation    PT/OT/SLP Co-Evaluation/Treatment: Yes Reason for Co-Treatment: For patient/therapist safety;To address functional/ADL transfers PT goals addressed during session: Mobility/safety with mobility;Balance;Strengthening/ROM OT goals addressed during session: ADL's and self-care;Strengthening/ROM      AM-PAC OT "6 Clicks" Daily Activity     Outcome Measure   Help from another person eating meals?: Total Help from another person taking care of personal grooming?: A Lot Help from another person  toileting, which includes using toliet, bedpan, or urinal?: Total Help from another person bathing (including washing, rinsing, drying)?: Total Help from another person to put on and taking off regular upper body clothing?: Total Help from another person to put on and taking off regular lower body clothing?: Total 6 Click Score: 7    End of Session    OT Visit Diagnosis: Unsteadiness on feet (R26.81);Muscle weakness (generalized) (M62.81);Other symptoms and signs involving cognitive function   Activity Tolerance Patient limited by fatigue;Patient tolerated treatment well   Patient Left in  bed;with call bell/phone within reach;with bed alarm set   Nurse Communication Mobility status        Time: 1610-9604 OT Time Calculation (min): 36 min  Charges: OT General Charges $OT Visit: 1 Visit OT Treatments $Self Care/Home Management : 8-22 mins  Tyler Deis, OTR/L [facility] Acute Rehabilitation Office: [phone]   Debra Sharp 09/02/2022, 11:14 AM

## 2022-09-02 NOTE — Progress Notes (Addendum)
11 Days Post-Op  Subjective: CC: Seen with RN. Patient is A&O x 4. No abdominal pain. Tolerating trickle tf's without n/v Having ostomy output (decreasing - 1150cc/24 hours, thicker output in ostomy bag). Drain is becoming more thin/clear.   Afebrile. Tachycardia resolved. No systolic hypotension. WBC down 24.5 > 20.3 > 18.6 > 16.2  Objective: Vital signs in last 24 hours: Temp:  [97.6 F (36.4 C)-98.8 F (37.1 C)] 97.9 F (36.6 C) (07/17 0729) Pulse Rate:  [68-110] 87 (07/17 0729) Resp:  [14-17] 14 (07/17 0729) BP: (119-166)/(57-77) 166/76 (07/17 0729) SpO2:  [98 %-100 %] 100 % (07/17 0729) Weight:  [78.1 kg] 78.1 kg (07/17 0432) Last BM Date : 09/01/22  Intake/Output from previous day: 07/16 0701 - 07/17 0700 In: 819.9 [I.V.:9.9; IV Piggyback:810] Out: 1730 [Urine:510; Drains:70; Stool:1150] Intake/Output this shift: No intake/output data recorded.  PE: Gen:  NAD, pleasant Abd: Soft, mild distension, NT. Stoma and mucus fistula viable - ostomy appliance with thciker brown output in bag.  Midline wound overall clean with some fibrinous tissue at the base with granulation tissue otherwise. JP drain with thinner output - more cloudy SS with some debris in it - no longer feculent appearing and less purulent appearing.   Lab Results:  Recent Labs    09/01/22 0319 09/02/22 0434  WBC 18.6* 16.2*  HGB 9.7* 8.5*  HCT 32.2* 28.6*  PLT 932* 1,044*   BMET Recent Labs    09/01/22 0319 09/02/22 0434  NA 136 134*  K 4.0 3.8  CL 111 111  CO2 17* 18*  GLUCOSE 92 110*  BUN 20 19  CREATININE 0.66 0.78  CALCIUM 8.0* 7.7*   PT/INR No results for input(s): "LABPROT", "INR" in the last 72 hours. CMP     Component Value Date/Time   NA 134 (L) 09/02/2022 0434   K 3.8 09/02/2022 0434   CL 111 09/02/2022 0434   CO2 18 (L) 09/02/2022 0434   GLUCOSE 110 (H) 09/02/2022 0434   BUN 19 09/02/2022 0434   CREATININE 0.78 09/02/2022 0434   CALCIUM 7.7 (L) 09/02/2022 0434    PROT 4.7 (L) 08/31/2022 0150   ALBUMIN 1.5 (L) 08/31/2022 0150   AST 17 08/31/2022 0150   ALT 12 08/31/2022 0150   ALKPHOS 91 08/31/2022 0150   BILITOT 0.5 08/31/2022 0150   GFRNONAA >60 09/02/2022 0434   GFRAA >60 05/11/2015 0231   Lipase  No results found for: "LIPASE"  Studies/Results: VAS [place] UPPER EXTREMITY VENOUS DUPLEX  Result Date: 09/01/2022 UPPER VENOUS STUDY  Patient Name:  Debra Sharp  Date of Exam:   09/01/2022 Medical Rec #: [id number]      Accession #:    [id number] Date of Birth: [birth year]      Patient Gender: F Patient Age:   72 years Exam Location:  [facility] Procedure:      VAS [place] UPPER EXTREMITY VENOUS DUPLEX Referring Phys: AMRIT ADHIKARI --------------------------------------------------------------------------------  Indications: edema Anticoagulation: Heparin. Limitations: Body habitus, poor ultrasound/tissue interface and patient positioning. Comparison Study: No previous study. Performing Technologist: McKayla Maag RVT, [place]  Examination Guidelines: A complete evaluation includes B-mode imaging, spectral Doppler, color Doppler, and power Doppler as needed of all accessible portions of each vessel. Bilateral testing is considered an integral part of a complete examination. Limited examinations for reoccurring indications may be performed as noted.  Right Findings: +----------+------------+---------+-----------+----------+--------------+ RIGHT     CompressiblePhasicitySpontaneousProperties   Summary     +----------+------------+---------+-----------+----------+--------------+ Subclavian  Not visualized +----------+------------+---------+-----------+----------+--------------+  Left Findings: +----------+------------+---------+-----------+----------+--------------+ LEFT      CompressiblePhasicitySpontaneousProperties   Summary     +----------+------------+---------+-----------+----------+--------------+ IJV            Full       Yes       Yes                             +----------+------------+---------+-----------+----------+--------------+ Subclavian    Full       Yes       Yes                             +----------+------------+---------+-----------+----------+--------------+ Axillary      Full       Yes       Yes                             +----------+------------+---------+-----------+----------+--------------+ Brachial      Full       Yes       Yes                             +----------+------------+---------+-----------+----------+--------------+ Radial        Full                                                 +----------+------------+---------+-----------+----------+--------------+ Ulnar         Full                                     limited     +----------+------------+---------+-----------+----------+--------------+ Cephalic      Full                                                 +----------+------------+---------+-----------+----------+--------------+ Basilic                                             Not visualized +----------+------------+---------+-----------+----------+--------------+  Summary:  Left: No evidence of deep vein thrombosis in the upper extremity. No evidence of superficial vein thrombosis in the upper extremity. However, unable to visualize the basilic vein.  *See table(s) above for measurements and observations.  Diagnosing physician: Waverly Ferrari MD Electronically signed by Waverly Ferrari MD on 09/01/2022 at 4:49:19 PM.    Final    [place] EKG SITE RITE  Result Date: 09/01/2022 If Site Rite image not attached, placement could not be confirmed due to current cardiac rhythm.  CT HEAD WO CONTRAST ( )  Result Date: 09/01/2022 CLINICAL DATA:  Delirium EXAM: CT HEAD WITHOUT CONTRAST TECHNIQUE: Contiguous axial images were obtained from the base of the skull through the vertex without intravenous contrast. RADIATION DOSE  REDUCTION: This exam was performed according to the departmental dose-optimization program which includes automated exposure control, adjustment of the mA and/or kV according to patient size and/or use of iterative reconstruction technique. COMPARISON:  08/26/2022 FINDINGS: Brain: The study suffers from some motion degradation. Generalized atrophy is again demonstrated. Areas of acute infarction shown by MRI 6 days ago are difficult to appreciate on this CT exam. There is probably some visible low-density in the right watershed distribution superiorly. No evidence of mass effect or hemorrhage. No new insult is identifiable. No hydrocephalus. No extra-axial collection. Vascular: There is atherosclerotic calcification of the major vessels at the base of the brain. Skull: Negative Sinuses/Orbits: Previous FESS. Chronic inflammation of the left division of the sphenoid sinus. No other advanced sinus disease. Orbits negative. Other: None IMPRESSION: No new finding by CT. Areas of acute infarction shown by MRI 6 days ago are difficult to appreciate on this CT exam. There is probably some visible low-density in the right watershed distribution superiorly. No evidence of mass effect or hemorrhage. No new insult is identifiable. Electronically Signed   By: Paulina Fusi M.D.   On: 09/01/2022 13:21   IR NEPHROSTOMY PLACEMENT RIGHT  Result Date: 08/31/2022 INDICATION: 72 year old female with obstructing pelvic mass and right-sided hydronephrosis. She presents for placement of a percutaneous nephrostomy tube. EXAM: IR NEPHROSTOMY PLACEMENT RIGHT COMPARISON:  CT abdomen/pelvis 08/30/2022 MEDICATIONS: In patient receiving multiple intravenous antibiotics. No additional prophylaxis was administered. ANESTHESIA/SEDATION: Fentanyl 25 mcg IV; Versed 1 mg IV Moderate Sedation Time:  14 minutes The patient's vital signs and level of consciousness were continuously monitored during the procedure by the interventional radiology nurse  under my direct supervision. CONTRAST:  7 mL OMNIPAQUE IOHEXOL 300 MG/ML SOLN - administered into the collecting system(s) FLUOROSCOPY: Radiation exposure index: 2.2 mGy reference air kerma COMPLICATIONS: None immediate. TECHNIQUE: The procedure, risks, benefits, and alternatives were explained to the patient. Questions regarding the procedure were encouraged and answered. The patient understands and consents to the procedure. The right flank was prepped with chlorhexidine in a sterile fashion, and a sterile drape was applied covering the operative field. A sterile gown and sterile gloves were used for the procedure. Local anesthesia was provided with 1% Lidocaine. The right flank was interrogated with ultrasound and the left kidney identified. The kidney is hydronephrotic. A suitable access site on the skin overlying the lower pole, posterior calix was identified. After local mg anesthesia was achieved, a small skin nick was made with an 11 blade scalpel. A 21 gauge Accustick needle was then advanced under direct sonographic guidance into the lower pole of the right kidney. A 0.018 inch wire was advanced under fluoroscopic guidance into the left renal collecting system. The Accustick sheath was then advanced over the wire and a 0.018 system exchanged for a 0.035 system. Gentle hand injection of contrast material confirms placement of the sheath within the renal collecting system. There is significant hydronephrosis. The tract from the scan into the renal collecting system was then dilated serially to 10-French. A 10-French Cook all-purpose drain was then placed and positioned under fluoroscopic guidance. The locking loop is well formed within the left renal pelvis. The catheter was secured to the skin with 2-0 Prolene and a sterile bandage was placed. Catheter was left to gravity bag drainage. IMPRESSION: Successful placement of a right 10 French percutaneous nephrostomy tube. A sample of aspirated urine was sent  for Gram stain and culture. Electronically Signed   By: Malachy Moan M.D.   On: 08/31/2022 17:11    Anti-infectives: Anti-infectives (From admission, onward)    Start     Dose/Rate Route Frequency Ordered Stop   08/30/22 0830  ceFEPIme (MAXIPIME) 2 g in  sodium chloride 0.9 % 100 mL IVPB        2 g 200 mL/hr over 30 Minutes Intravenous Every 8 hours 08/30/22 0826     08/23/22 2100  ceFEPIme (MAXIPIME) 2 g in sodium chloride 0.9 % 100 mL IVPB  Status:  Discontinued        2 g 200 mL/hr over 30 Minutes Intravenous Every 12 hours 08/23/22 1357 08/30/22 0826   08/23/22 0400  micafungin (MYCAMINE) 100 mg in sodium chloride 0.9 % 100 mL IVPB        100 mg 105 mL/hr over 1 Hours Intravenous Daily 08/23/22 0242     08/22/22 1800  metroNIDAZOLE (FLAGYL) IVPB 500 mg        500 mg 100 mL/hr over 60 Minutes Intravenous Every 12 hours 08/22/22 1711     08/22/22 1800  ceFEPIme (MAXIPIME) 2 g in sodium chloride 0.9 % 100 mL IVPB  Status:  Discontinued        2 g 200 mL/hr over 30 Minutes Intravenous Every 8 hours 08/22/22 1716 08/23/22 1357   08/22/22 1724  metroNIDAZOLE (FLAGYL) 500 MG/100ML IVPB       Note to Pharmacy: [name]: cabinet override      08/22/22 1724 08/23/22 0845   08/19/22 1030  cefTRIAXone (ROCEPHIN) 1 g in sodium chloride 0.9 % 100 mL IVPB  Status:  Discontinued        1 g 200 mL/hr over 30 Minutes Intravenous Every 24 hours 08/19/22 0937 08/22/22 1715        Assessment/Plan POD 11 s/p exploratory laparotomy, right hemicolectomy, ileostomy, mucus fistula, biopsy of pelvic mass by Dr. 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 Margins negative. 0/43 lymph nodes.  - CEA - 143, CA 125: 40 and CA 19-9: 50 - Has had CT CAP since admission - Discussed case with attending - given mass is cancerous, the concern is that this was not resectable  during her initial operation. If this progresses and causes another obstruction I think her options would be limited surgically. Recommend oncology consult to see what options she has moving forward. This would be helpful for continued palliative discussions. I have sent these recs to [facility].   - High ileostomy output. Cont fiber, iron and immodium. Discussed with attending, will hold off on adding anything additional for now. Monitor output, Cr, electrolytes. Avoid sugary drinks.  - WOC for stoma teaching and assistance with pouching. - BID wet to dry to midline - Cont JP drain - CT 7/14 obtained for drain appearing feculent - this showed small volume fluid within the abdomen and pelvis with several collections demonstrating rim-enhancement suggestive of abscesses -  lower right pericolic gutter fluid collection measures 5.2 x 3.5 x 4.1 cm and collection along the anterior margin of the uterine fundus measures approximately 7.2 x 1.8 x 5.1 cm. Surgical drain is near both of these collections but does not necessarily course through them completely. IR consulted for drainage. They felt the intra-abdominal fluid collections are small and look like post-op blood. Cont abx for now. WBC improving. Will discuss duration of abx with MD.  - Now tolerating TF's and drain is clearing up. Okay with slowly increasing TF's and adding back PO's if okay with SLP. Discussed with RN, [facility] and RD. Given we are advancing enteral nutrition today, would hold off on TPN.    FEN: Adv TF's. Okay for  PO if still okay with SLP. IVF per [facility] VTE: SCDs, Heparin gtt ID: Cefepime/flagyl/micafungin Foley: Removed, Monitor. Urology following. S/p R PCN Dispo - As above  Per [facility] New stroke - Seen on MRI 7/10, suspected thromboembolic, defer to primary team/stroke team for further w/u and management.  PE - heparin drip per primary.  HTN HLD GERD COPD R hydro - s/p R PCN CT 7/14 also suggestion of splenic infarct and bilateral  pleural effusions    LOS: 15 days    Jacinto Halim , [facility] Surgery 09/02/2022, 10:20 AM Please see Amion for pager number during day hours 7:00am-4:30pm

## 2022-09-02 NOTE — Consult Note (Signed)
[facility]  [phone]   HEMATOLOGY ONCOLOGY INPATIENT CONSULTATION   Debra Sharp  DOB: [birth year]  MR#: [id number]  CSN#: [id number]    Requesting Physician: [facility]  Patient Care Team: Clayborn Heron, MD as PCP - General  Reason for consult: Newly diagnosed small bowel adenocarcinoma  History of present illness:   Patient is a 72 year old female with past medical history of COPD, CHF with EF 45%, hypertension, presented with anemia, nausea vomiting and abdominal pain.  She was admitted to hospital on August 22, 2022, I was consulted for newly diagnosed small bowel adenocarcinoma.  She was hospitalized in early March 2024 for symptomatic anemia and thrombocytosis, and received 2 units of blood transfusion, IV iron, and was discharged home.  She has developed intermittent abdominal pain, worsening nausea, vomiting, poor oral intake in the past months, and presented to hospital on August 22, 2022.  Workup including CT scan showed no intraperitoneal free air, secondary to colon perforation.  Patient was seen by general surgeon, and was taken to the OR on same day by Dr. Bedelia Person.  She was found to have a large mass in cecum and terminal ileum which are stuck to her uterus, colon perforation was noticed, status post right colectomy.  Thin and friable tissue was noticed in the parametrium, biopsy was obtained, which was consistent with metastatic small bowel adenocarcinoma.  Her postop course was complicated with PE and septic shock secondary to acute peritonitis, and acute CVA.  She required ICU stay and intubation.  She finally recovered, was extubated, and transferred to medical floor on August 28, 2022.  She was little drowsy when I talked to her, her son was at bedside.  She was able to answer questions appropriately.  MEDICAL HISTORY:  Past Medical History:  Diagnosis Date   Anemia    Anxiety    Arthritis    Asthma    Blood transfusion without reported  diagnosis    Cataract    removed bilateral   COPD (chronic obstructive pulmonary disease) (HCC)    Depression    GERD (gastroesophageal reflux disease)    Hyperlipidemia    Hypertension     SURGICAL HISTORY: Past Surgical History:  Procedure Laterality Date   APPENDECTOMY     cataracts Bilateral    COLOSTOMY N/A 08/22/2022   Procedure: COLOSTOMY;  Surgeon: Diamantina Monks, MD;  Location: [facility] OR;  Service: General;  Laterality: N/A;   IR NEPHROSTOMY PLACEMENT RIGHT  08/31/2022   KNEE SURGERY     LAPAROTOMY N/A 08/22/2022   Procedure: EXPLORATORY LAPAROTOMY;  Surgeon: Diamantina Monks, MD;  Location: [facility] OR;  Service: General;  Laterality: N/A;   NASAL SINUS SURGERY     PARTIAL COLECTOMY N/A 08/22/2022   Procedure: PARTIAL COLECTOMY;  Surgeon: Diamantina Monks, MD;  Location: [facility] OR;  Service: General;  Laterality: N/A;    SOCIAL HISTORY: Social History   Socioeconomic History   Marital status: Divorced    Spouse name: Not on file   Number of children: Not on file   Years of education: Not on file   Highest education level: Not on file  Occupational History   Not on file  Tobacco Use   Smoking status: Former    Types: Cigarettes   Smokeless tobacco: Never  Vaping Use   Vaping status: Never Used  Substance and Sexual Activity   Alcohol use: No   Drug use: No   Sexual activity: Not on file  Other  Topics Concern   Not on file  Social History Narrative   Not on file   Social Determinants of Health   Financial Resource Strain: Not on file  Food Insecurity: No Food Insecurity (08/19/2022)   Hunger Vital Sign    Worried About Running Out of Food in the Last Year: Never true    Ran Out of Food in the Last Year: Never true  Transportation Needs: No Transportation Needs (08/19/2022)   PRAPARE - Administrator, Civil Service (Medical): No    Lack of Transportation (Non-Medical): No  Physical Activity: Not on file  Stress: Not on file  Social Connections: Not on file   Intimate Partner Violence: Not At Risk (08/19/2022)   Humiliation, Afraid, Rape, and Kick questionnaire    Fear of Current or Ex-Partner: No    Emotionally Abused: No    Physically Abused: No    Sexually Abused: No    FAMILY HISTORY: Family History  Problem Relation Age of Onset   Colon cancer Neg Hx    Colon polyps Neg Hx    Crohn's disease Neg Hx    Esophageal cancer Neg Hx    Rectal cancer Neg Hx    Stomach cancer Neg Hx    Ulcerative colitis Neg Hx     ALLERGIES:  is allergic to aspirin, ibuprofen, sulfonamide derivatives, and penicillins.  MEDICATIONS:  Current Facility-Administered Medications  Medication Dose Route Frequency Provider Last Rate Last Admin   0.9 %  sodium chloride infusion  250 mL Intravenous Continuous Kamat, Clemetine Marker, MD   Stopping previously hung infusion at 08/29/22 1855   acetaminophen (TYLENOL) 160 MG/5ML solution 650 mg  650 mg Per Tube Q6H Clark, Laura P, DO   650 mg at 09/02/22 1722   albuterol (PROVENTIL) (2.5 MG/3ML) 0.083% nebulizer solution 2.5 mg  2.5 mg Inhalation Q4H PRN Coralyn Helling, MD       arformoterol (BROVANA) nebulizer solution 15 mcg  15 mcg Nebulization BID Coralyn Helling, MD   15 mcg at 09/02/22 0725   atorvastatin (LIPITOR) tablet 20 mg  20 mg Per Tube Daily Pia Mau D, PA-C   20 mg at 09/02/22 0930   budesonide (PULMICORT) nebulizer solution 0.5 mg  0.5 mg Nebulization BID Coralyn Helling, MD   0.5 mg at 09/02/22 0725   ceFEPIme (MAXIPIME) 2 g in sodium chloride 0.9 % 100 mL IVPB  2 g Intravenous Q8H Jenita Seashore, RPH 200 mL/hr at 09/02/22 1726 2 g at 09/02/22 1726   Chlorhexidine Gluconate Cloth 2 % PADS 6 each  6 each Topical Daily Burnadette Pop, MD   6 each at 09/02/22 0931   feeding supplement (PROSource TF20) liquid 60 mL  60 mL Per Tube Daily Adam Phenix, PA-C   60 mL at 09/02/22 0931   feeding supplement (VITAL 1.5 CAL) liquid 1,000 mL  1,000 mL Per Tube Continuous Steffanie Dunn, DO 10 mL/hr at 09/02/22 0116 1,000  mL at 09/02/22 0116   ferrous sulfate tablet 325 mg  325 mg Oral BID WC Jacinto Halim, PA-C   325 mg at 09/02/22 1722   heparin ADULT infusion 100 units/mL (25000 units/253mL)  1,850 Units/hr Intravenous Continuous Tat, Onalee Hua, MD 18.5 mL/hr at 09/02/22 2011 1,850 Units/hr at 09/02/22 2011   hydrALAZINE (APRESOLINE) injection 10-40 mg  10-40 mg Intravenous Q4H PRN Karie Fetch P, DO       insulin aspart (novoLOG) injection 0-9 Units  0-9 Units Subcutaneous Q4H Coralyn Helling, MD  1 Units at 08/31/22 0403   lip balm (CARMEX) ointment   Topical PRN Karie Fetch P, DO       loperamide (IMODIUM) capsule 2 mg  2 mg Oral BID Jacinto Halim, PA-C   2 mg at 09/02/22 2150   melatonin tablet 3 mg  3 mg Oral QHS Burnadette Pop, MD   3 mg at 09/02/22 2150   metroNIDAZOLE (FLAGYL) IVPB 500 mg  500 mg Intravenous Q12H Osvaldo Shipper, MD 100 mL/hr at 09/02/22 1822 500 mg at 09/02/22 1822   micafungin (MYCAMINE) 100 mg in sodium chloride 0.9 % 100 mL IVPB  100 mg Intravenous Daily Oretha Milch, MD 105 mL/hr at 09/02/22 1248 100 mg at 09/02/22 1248   morphine (PF) 2 MG/ML injection 2-4 mg  2-4 mg Intravenous Q2H PRN Diamantina Monks, MD   2 mg at 08/27/22 4332   multivitamin with minerals tablet 1 tablet  1 tablet Per Tube Daily Adam Phenix, PA-C   1 tablet at 09/02/22 0930   naloxone (NARCAN) injection 0.4 mg  0.4 mg Intravenous PRN Cheri Fowler, MD   0.4 mg at 08/24/22 1836   ondansetron (ZOFRAN) tablet 4 mg  4 mg Oral Q6H PRN Emeline General, MD       Or   ondansetron [facility]) injection 4 mg  4 mg Intravenous Q6H PRN Mikey College T, MD   4 mg at 08/28/22 0526   Oral care mouth rinse  15 mL Mouth Rinse PRN Osvaldo Shipper, MD       oxyCODONE (Oxy IR/ROXICODONE) immediate release tablet 5-10 mg  5-10 mg Per Tube Q4H PRN Karie Fetch P, DO   10 mg at 09/02/22 1108   pantoprazole (PROTONIX) injection 40 mg  40 mg Intravenous Q12H Mikey College T, MD   40 mg at 09/02/22 2146   phosphorus (K PHOS  NEUTRAL) tablet 500 mg  500 mg Per Tube TID Burnadette Pop, MD   500 mg at 09/02/22 2150   polycarbophil (FIBERCON) tablet 625 mg  625 mg Per Tube BID Burnadette Pop, MD   625 mg at 09/02/22 2150   promethazine (PHENERGAN) 12.5 mg in sodium chloride 0.9 % 50 mL IVPB  12.5 mg Intravenous Q6H PRN Howerter, Justin B, DO       revefenacin (YUPELRI) nebulizer solution 175 mcg  175 mcg Nebulization Daily Coralyn Helling, MD   175 mcg at 09/02/22 0725   sodium chloride flush (NS) 0.9 % injection 10-40 mL  10-40 mL Intracatheter Q12H Burnadette Pop, MD   10 mL at 09/02/22 0958   sodium chloride flush (NS) 0.9 % injection 10-40 mL  10-40 mL Intracatheter PRN Burnadette Pop, MD       tamsulosin (FLOMAX) capsule 0.4 mg  0.4 mg Oral Daily Burnadette Pop, MD   0.4 mg at 09/02/22 0931    REVIEW OF SYSTEMS:   Constitutional: Denies fevers, chills or abnormal night sweats, (+) fatigue and weight loss  Eyes: Denies blurriness of vision, double vision or watery eyes Ears, nose, mouth, throat, and face: Denies mucositis or sore throat Respiratory: Denies cough, dyspnea or wheezes Cardiovascular: Denies palpitation, chest discomfort or lower extremity swelling Gastrointestinal:  see HPI  Skin: Denies abnormal skin rashes Lymphatics: Denies new lymphadenopathy or easy bruising Neurological:Denies numbness, tingling or new weaknesses Behavioral/Psych: Mood is stable, no new changes  All other systems were reviewed with the patient and are negative.  PHYSICAL EXAMINATION: ECOG PERFORMANCE STATUS: 3 - Symptomatic, >50% confined to bed  Vitals:   09/02/22 1548 09/02/22 1947  BP: (!) 143/62 (!) 142/67  Pulse: 90 94  Resp: 18 15  Temp: 97.8 F (36.6 C) 98.1 F (36.7 C)  SpO2: 96% 99%   Filed Weights   08/31/22 0443 09/01/22 0400 09/02/22 0432  Weight: 180 lb 1.9 oz (81.7 kg) 170 lb 3.1 oz (77.2 kg) 172 lb 2.9 oz (78.1 kg)    GENERAL:alert, no distress and comfortable SKIN: skin color, texture, turgor  are normal, no rashes or significant lesions EYES: normal, conjunctiva are pink and non-injected, sclera clear OROPHARYNX:no exudate, no erythema and lips, buccal mucosa, and tongue normal  NECK: supple, thyroid normal size, non-tender, without nodularity LYMPH:  no palpable lymphadenopathy in the cervical, axillary or inguinal LUNGS: clear to auscultation and percussion with normal breathing effort HEART: regular rate & rhythm and no murmurs and no lower extremity edema ABDOMEN:abdomen soft, (+) ileostomy, and 2 draining tubes Musculoskeletal:no cyanosis of digits and no clubbing  PSYCH: alert & oriented x 3 with fluent speech NEURO: no focal motor/sensory deficits  LABORATORY DATA:  I have reviewed the data as listed Lab Results  Component Value Date   WBC 16.2 (H) 09/02/2022   HGB 8.5 (L) 09/02/2022   HCT 28.6 (L) 09/02/2022   MCV 81.5 09/02/2022   PLT 1,044 (HH) 09/02/2022   Recent Labs    04/24/22 1317 04/25/22 0547 08/22/22 2052 08/22/22 2116 08/23/22 0922 08/23/22 1821 08/31/22 0150 09/01/22 0319 09/02/22 0434  NA  --    < > 141   < > 140   < > 133* 136 134*  K  --    < > 5.3*   < > 6.2*   < > 4.1 4.0 3.8  CL  --    < > 112*   < > 111   < > 108 111 111  CO2  --    < > 17*   < > 20*   < > 17* 17* 18*  GLUCOSE  --    < > 93   < > 113*   < > 130* 92 110*  BUN  --    < > 14   < > 17   < > 24* 20 19  CREATININE  --    < > 0.90   < > 0.98   < > 0.71 0.66 0.78  CALCIUM  --    < > 8.0*   < > 8.4*   < > 7.5* 8.0* 7.7*  GFRNONAA  --    < > >60   < > >60   < > >60 >60 >60  PROT 5.8*   < > 4.6*  --  4.6*  --  4.7*  --   --   ALBUMIN 2.2*   < > 2.7*  --  2.4*  --  1.5*  --   --   AST 17   < > 9*  --  14*  --  17  --   --   ALT 10   < > 8  --  7  --  12  --   --   ALKPHOS 96   < > 44  --  35*  --  91  --   --   BILITOT 0.4   < > 1.9*  --  1.4*  --  0.5  --   --   BILIDIR <0.1  --   --   --   --   --   --   --   --  IBILI NOT CALCULATED  --   --   --   --   --   --   --   --     < > = values in this interval not displayed.    RADIOGRAPHIC STUDIES: I have personally reviewed the radiological images as listed and agreed with the findings in the report. VAS [place] UPPER EXTREMITY VENOUS DUPLEX  Result Date: 09/01/2022 UPPER VENOUS STUDY  Patient Name:  RONIN CRAGER  Date of Exam:   09/01/2022 Medical Rec #: [id number]      Accession #:    [id number] Date of Birth: 1950/08/16      Patient Gender: F Patient Age:   69 years Exam Location:  [facility] Procedure:      VAS [place] UPPER EXTREMITY VENOUS DUPLEX Referring Phys: AMRIT ADHIKARI --------------------------------------------------------------------------------  Indications: edema Anticoagulation: Heparin. Limitations: Body habitus, poor ultrasound/tissue interface and patient positioning. Comparison Study: No previous study. Performing Technologist: McKayla Maag RVT, [place]  Examination Guidelines: A complete evaluation includes B-mode imaging, spectral Doppler, color Doppler, and power Doppler as needed of all accessible portions of each vessel. Bilateral testing is considered an integral part of a complete examination. Limited examinations for reoccurring indications may be performed as noted.  Right Findings: +----------+------------+---------+-----------+----------+--------------+ RIGHT     CompressiblePhasicitySpontaneousProperties   Summary     +----------+------------+---------+-----------+----------+--------------+ Subclavian                                          Not visualized +----------+------------+---------+-----------+----------+--------------+  Left Findings: +----------+------------+---------+-----------+----------+--------------+ LEFT      CompressiblePhasicitySpontaneousProperties   Summary     +----------+------------+---------+-----------+----------+--------------+ IJV           Full       Yes       Yes                              +----------+------------+---------+-----------+----------+--------------+ Subclavian    Full       Yes       Yes                             +----------+------------+---------+-----------+----------+--------------+ Axillary      Full       Yes       Yes                             +----------+------------+---------+-----------+----------+--------------+ Brachial      Full       Yes       Yes                             +----------+------------+---------+-----------+----------+--------------+ Radial        Full                                                 +----------+------------+---------+-----------+----------+--------------+ Ulnar         Full  limited     +----------+------------+---------+-----------+----------+--------------+ Cephalic      Full                                                 +----------+------------+---------+-----------+----------+--------------+ Basilic                                             Not visualized +----------+------------+---------+-----------+----------+--------------+  Summary:  Left: No evidence of deep vein thrombosis in the upper extremity. No evidence of superficial vein thrombosis in the upper extremity. However, unable to visualize the basilic vein.  *See table(s) above for measurements and observations.  Diagnosing physician: Waverly Ferrari MD Electronically signed by Waverly Ferrari MD on 09/01/2022 at 4:49:19 PM.    Final    [place] EKG SITE RITE  Result Date: 09/01/2022 If Site Rite image not attached, placement could not be confirmed due to current cardiac rhythm.  CT HEAD WO CONTRAST ( )  Result Date: 09/01/2022 CLINICAL DATA:  Delirium EXAM: CT HEAD WITHOUT CONTRAST TECHNIQUE: Contiguous axial images were obtained from the base of the skull through the vertex without intravenous contrast. RADIATION DOSE REDUCTION: This exam was performed according to the  departmental dose-optimization program which includes automated exposure control, adjustment of the mA and/or kV according to patient size and/or use of iterative reconstruction technique. COMPARISON:  08/26/2022 FINDINGS: Brain: The study suffers from some motion degradation. Generalized atrophy is again demonstrated. Areas of acute infarction shown by MRI 6 days ago are difficult to appreciate on this CT exam. There is probably some visible low-density in the right watershed distribution superiorly. No evidence of mass effect or hemorrhage. No new insult is identifiable. No hydrocephalus. No extra-axial collection. Vascular: There is atherosclerotic calcification of the major vessels at the base of the brain. Skull: Negative Sinuses/Orbits: Previous FESS. Chronic inflammation of the left division of the sphenoid sinus. No other advanced sinus disease. Orbits negative. Other: None IMPRESSION: No new finding by CT. Areas of acute infarction shown by MRI 6 days ago are difficult to appreciate on this CT exam. There is probably some visible low-density in the right watershed distribution superiorly. No evidence of mass effect or hemorrhage. No new insult is identifiable. Electronically Signed   By: Paulina Fusi M.D.   On: 09/01/2022 13:21   IR NEPHROSTOMY PLACEMENT RIGHT  Result Date: 08/31/2022 INDICATION: 71 year old female with obstructing pelvic mass and right-sided hydronephrosis. She presents for placement of a percutaneous nephrostomy tube. EXAM: IR NEPHROSTOMY PLACEMENT RIGHT COMPARISON:  CT abdomen/pelvis 08/30/2022 MEDICATIONS: In patient receiving multiple intravenous antibiotics. No additional prophylaxis was administered. ANESTHESIA/SEDATION: Fentanyl 25 mcg IV; Versed 1 mg IV Moderate Sedation Time:  14 minutes The patient's vital signs and level of consciousness were continuously monitored during the procedure by the interventional radiology nurse under my direct supervision. CONTRAST:  7 mL  OMNIPAQUE IOHEXOL 300 MG/ML SOLN - administered into the collecting system(s) FLUOROSCOPY: Radiation exposure index: 2.2 mGy reference air kerma COMPLICATIONS: None immediate. TECHNIQUE: The procedure, risks, benefits, and alternatives were explained to the patient. Questions regarding the procedure were encouraged and answered. The patient understands and consents to the procedure. The right flank was prepped with chlorhexidine in a sterile fashion, and a sterile drape was applied covering the operative field.  A sterile gown and sterile gloves were used for the procedure. Local anesthesia was provided with 1% Lidocaine. The right flank was interrogated with ultrasound and the left kidney identified. The kidney is hydronephrotic. A suitable access site on the skin overlying the lower pole, posterior calix was identified. After local mg anesthesia was achieved, a small skin nick was made with an 11 blade scalpel. A 21 gauge Accustick needle was then advanced under direct sonographic guidance into the lower pole of the right kidney. A 0.018 inch wire was advanced under fluoroscopic guidance into the left renal collecting system. The Accustick sheath was then advanced over the wire and a 0.018 system exchanged for a 0.035 system. Gentle hand injection of contrast material confirms placement of the sheath within the renal collecting system. There is significant hydronephrosis. The tract from the scan into the renal collecting system was then dilated serially to 10-French. A 10-French Cook all-purpose drain was then placed and positioned under fluoroscopic guidance. The locking loop is well formed within the left renal pelvis. The catheter was secured to the skin with 2-0 Prolene and a sterile bandage was placed. Catheter was left to gravity bag drainage. IMPRESSION: Successful placement of a right 10 French percutaneous nephrostomy tube. A sample of aspirated urine was sent for Gram stain and culture. Electronically  Signed   By: Malachy Moan M.D.   On: 08/31/2022 17:11   CT ABDOMEN PELVIS W CONTRAST  Result Date: 08/30/2022 CLINICAL DATA:  Abdominal pain. Right hemicolectomy and ileostomy 8 days ago. Now with feculent drainage EXAM: CT ABDOMEN AND PELVIS WITH CONTRAST TECHNIQUE: Multidetector CT imaging of the abdomen and pelvis was performed using the standard protocol following bolus administration of intravenous contrast. RADIATION DOSE REDUCTION: This exam was performed according to the departmental dose-optimization program which includes automated exposure control, adjustment of the mA and/or kV according to patient size and/or use of iterative reconstruction technique. CONTRAST:  75mL OMNIPAQUE IOHEXOL 350 MG/ML SOLN COMPARISON:  08/22/2022, 08/19/2022 FINDINGS: Lower chest: Small bilateral pleural effusions, left greater than right. Effusions have increased in size from prior. Associated compressive atelectasis. Hepatobiliary: Liver is enlarged measuring approximately 21 cm in length. No focal liver lesion identified. Gallbladder within normal limits. Pancreas: Unremarkable. No pancreatic ductal dilatation or surrounding inflammatory changes. Spleen: Enlarged spleen with new, near-complete hypoattenuation of the splenic parenchyma. There also appears to be subcapsular low-density fluid along the peripheral margin of the spleen. Adrenals/Urinary Tract: Unremarkable adrenal glands. Severe right hydroureteronephrosis. Layering high attenuation material within the right renal collecting system is likely related to contrast excretion. New mild left hydronephrosis. Urinary bladder within normal limits. Stomach/Bowel: Enteric tube extends into the stomach with distal tip terminating at the level of the duodenal bulb. Interval partial right hemicolectomy with right lower quadrant ostomy. Multiple mildly dilated small bowel loops measuring up to 4.4 cm in diameter. Numerous air-fluid levels. Enteric contrast has  partially traversed small bowel. Vascular/Lymphatic: Aortic atherosclerosis. No definite lymphadenopathy. Reproductive: Large calcified uterine fibroid. Ovaries are not definitively seen. Other: Midline surgical incision site. Surgical drain is present within the pelvis with distal tip terminating in the lower right hemiabdomen. Small volume fluid within the abdomen and pelvis with several collections demonstrating partial rim enhancement. For example, collection within the lower right pericolic gutter measures approximately 5.2 x 3.5 x 4.1 cm (series 3, image 58). Collection along the anterior margin of the uterine fundus measures approximately 7.2 x 1.8 x 5.1 cm (series 3, image 69). Small amount of air is  noted along the course of the surgical drain. No significant volume of pneumoperitoneum. Large complex mass occupying the majority of the low right pelvis is grossly unchanged. Musculoskeletal: No acute osseous abnormality. Increased body wall edema. IMPRESSION: 1. Interval postsurgical changes to the abdomen and pelvis including partial right hemicolectomy with right lower quadrant ostomy. 2. Small volume fluid within the abdomen and pelvis with several collections demonstrating rim-enhancement suggestive of abscesses. 3. New, near-complete hypoattenuation of the splenic parenchyma with subcapsular low-density fluid along the peripheral margin of the spleen. Findings are concerning for global splenic infarct. 4. Multiple mildly dilated small bowel loops with numerous air-fluid levels. Findings are favored to represent postoperative ileus. 5. Persistent severe right hydroureteronephrosis. New mild left hydronephrosis. 6. Small bilateral pleural effusions, left greater than right. Effusions have increased in size from prior. 7. Large complex mass occupying the majority of the low right pelvis is grossly unchanged. 8. Hepatomegaly. 9. Aortic atherosclerosis (ICD10-I70.0). These results will be called to the  ordering clinician or representative by the Radiologist Assistant, and communication documented in the PACS or [employer]. Electronically Signed   By: Duanne Guess D.O.   On: 08/30/2022 15:05   VAS [place] LOWER EXTREMITY VENOUS (DVT)  Result Date: 08/28/2022  Lower Venous DVT Study Patient Name:  GWENETTE WELLONS  Date of Exam:   08/27/2022 Medical Rec #: [id number]      Accession #:    [id number] Date of Birth: [birth year]      Patient Gender: F Patient Age:   89 years Exam Location:  [facility] Procedure:      VAS [place] LOWER EXTREMITY VENOUS (DVT) Referring Phys: Scheryl Marten XU --------------------------------------------------------------------------------  Indications: Hypercoagulable state.  Limitations: Body habitus and poor ultrasound/tissue interface. Comparison Study: No previous exams Performing Technologist: Jody Hill RVT, RDMS  Examination Guidelines: A complete evaluation includes B-mode imaging, spectral Doppler, color Doppler, and power Doppler as needed of all accessible portions of each vessel. Bilateral testing is considered an integral part of a complete examination. Limited examinations for reoccurring indications may be performed as noted. The reflux portion of the exam is performed with the patient in reverse Trendelenburg.  +---------+---------------+---------+-----------+----------+-------------------+ RIGHT    CompressibilityPhasicitySpontaneityPropertiesThrombus Aging      +---------+---------------+---------+-----------+----------+-------------------+ CFV      Full           Yes      Yes                                      +---------+---------------+---------+-----------+----------+-------------------+ SFJ      Full                                                             +---------+---------------+---------+-----------+----------+-------------------+ FV Prox  Full           Yes      Yes                                       +---------+---------------+---------+-----------+----------+-------------------+ FV Mid   Full           Yes      Yes  Not well visualized +---------+---------------+---------+-----------+----------+-------------------+ FV DistalFull           Yes      Yes                                      +---------+---------------+---------+-----------+----------+-------------------+ PFV                     Yes      Yes                                      +---------+---------------+---------+-----------+----------+-------------------+ POP      Full           Yes      Yes                                      +---------+---------------+---------+-----------+----------+-------------------+ PTV                                                   Not visualized      +---------+---------------+---------+-----------+----------+-------------------+ PERO     Full                                         Not well visualized +---------+---------------+---------+-----------+----------+-------------------+   +---------+---------------+---------+-----------+----------+-------------------+ LEFT     CompressibilityPhasicitySpontaneityPropertiesThrombus Aging      +---------+---------------+---------+-----------+----------+-------------------+ CFV      Full           Yes      Yes                                      +---------+---------------+---------+-----------+----------+-------------------+ SFJ      Full                                                             +---------+---------------+---------+-----------+----------+-------------------+ FV Prox  Full           Yes      Yes                                      +---------+---------------+---------+-----------+----------+-------------------+ FV Mid   Full           Yes      Yes                  Not well visualized +---------+---------------+---------+-----------+----------+-------------------+ FV  DistalFull           Yes      Yes                                      +---------+---------------+---------+-----------+----------+-------------------+  PFV                     Yes      Yes                                      +---------+---------------+---------+-----------+----------+-------------------+ POP      Full           Yes      Yes                                      +---------+---------------+---------+-----------+----------+-------------------+ PTV                                                   Not visualized      +---------+---------------+---------+-----------+----------+-------------------+ PERO                                                  Not visualized      +---------+---------------+---------+-----------+----------+-------------------+     Summary: BILATERAL: -No evidence of popliteal cyst, bilaterally. RIGHT: - There is no evidence of deep vein thrombosis in the lower extremity. However, portions of this examination were limited- see technologist comments above.  LEFT: - There is no evidence of deep vein thrombosis in the lower extremity. However, portions of this examination were limited- see technologist comments above.  *See table(s) above for measurements and observations. Electronically signed by Gerarda Fraction on 08/28/2022 at 5:34:47 PM.    Final    CT Angio Chest Pulmonary Embolism (PE) W or WO Contrast  Result Date: 08/27/2022 CLINICAL DATA:  Pulmonary embolism (PE) suspected, high prob Weakness and shortness of breath. EXAM: CT ANGIOGRAPHY CHEST WITH CONTRAST TECHNIQUE: Multidetector CT imaging of the chest was performed using the standard protocol during bolus administration of intravenous contrast. Multiplanar CT image reconstructions and MIPs were obtained to evaluate the vascular anatomy. RADIATION DOSE REDUCTION: This exam was performed according to the departmental dose-optimization program which includes automated exposure control,  adjustment of the mA and/or kV according to patient size and/or use of iterative reconstruction technique. CONTRAST:  75mL OMNIPAQUE IOHEXOL 350 MG/ML SOLN COMPARISON:  Radiograph 08/23/2022 FINDINGS: Cardiovascular: Positive for small volume acute pulmonary embolus, involving a subsegmental branch in the right upper lobe, for example series 7, image 99. Small amount of thrombus within right lower lobar pulmonary artery series 7, image 141, extending into the segmental and subsegmental branches. Thromboembolic burden is small to moderate. There is no right heart strain. Moderate calcified as well as noncalcified atheromatous plaque throughout the thoracic aorta, including rounded plaque in the descending aorta series 5, images 84 and 106. No aortic aneurysm. There are coronary artery calcifications. Heart size upper normal. Suggestion of lipomatous hypertrophy of the interatrial septum. Small hiatal hernia. Mediastinum/Nodes: Enteric tube tip in the stomach. Well-circumscribed fluid density structure in the anterior mediastinum measures 2.1 x 2.1 cm series 5, image 57. Well-defined rounded fat density structures in the left suprahilar region, Hounsfield units of -124, 18 and 7 mm series 5, image 57. This appears to  be contiguous with the mediastinal fat. Lungs/Pleura: Small to moderate left and small right pleural effusion. Associated compressive atelectasis. No definite pulmonary infarct. 3-4 mm subpleural right upper lobe nodule has mild surrounding ground-glass, series 6, image 55. No endobronchial debris. Upper Abdomen: Small amount of free fluid in the left upper quadrant. Musculoskeletal: There are no acute or suspicious osseous abnormalities. Review of the MIP images confirms the above findings. IMPRESSION: 1. Positive for acute pulmonary embolus, involving a subsegmental branch in the right upper lobe and right lower lobar pulmonary artery. Thromboembolic burden is small to moderate. No right heart strain.  2. Small to moderate left and small right pleural effusions with compressive atelectasis. 3. Well-circumscribed fluid density structure in the anterior mediastinum measuring 2.1 x 2.1 cm. This may represent a thymic cyst. Recommend MRI for further assessment on an elective basis after resolution of acute event. 4. There is also a well-defined fat density structure in the left suprahilar region which appears to be contiguous with the mediastinal fat. Etiology is uncertain. 5. A 3-4 mm subpleural right upper lobe nodule has mild surrounding ground-glass. This is likely infectious or inflammatory. Per Fleischner Society Guidelines, no routine follow-up imaging is recommended. These guidelines do not apply to immunocompromised patients and patients with cancer. Follow up in patients with significant comorbidities as clinically warranted. For lung cancer screening, adhere to Lung-RADS guidelines. Reference: Radiology. 2017; 284(1):228-43. 6. Moderate calcified as well as noncalcified atheromatous plaque throughout the thoracic aorta, including rounded plaque in the descending aorta. 7. Aortic Atherosclerosis (ICD10-I70.0). Coronary artery calcifications. Critical Value/emergent results were called by telephone at the time of interpretation on 08/27/2022 at 6:56 PM to provider Karie Fetch , who verbally acknowledged these results. Electronically Signed   By: Narda Rutherford M.D.   On: 08/27/2022 19:02   US RENAL  Result Date: 08/27/2022 CLINICAL DATA:  Hydronephrosis EXAM: RENAL / URINARY TRACT ULTRASOUND COMPLETE COMPARISON:  CT abdomen pelvis 08/22/2022 FINDINGS: Right Kidney: Renal measurements: 11.1 x 5.8 x 5.4 cm = volume: 181 mL. Diffusely thinned cortex. Unchanged severe hydronephrosis. Left Kidney: Renal measurements: 11.9 x 8.4 x 5.4 cm = volume: 16 mL. Echogenicity within normal limits. No mass or hydronephrosis visualized. Bladder: Not visualized. Other: None. IMPRESSION: Unchanged severe right  hydronephrosis. Electronically Signed   By: Acquanetta Belling M.D.   On: 08/27/2022 12:00   CT ANGIO HEAD NECK W WO CM  Result Date: 08/26/2022 CLINICAL DATA:  Follow-up embolic strokes EXAM: CT ANGIOGRAPHY HEAD AND NECK WITH AND WITHOUT CONTRAST TECHNIQUE: Multidetector CT imaging of the head and neck was performed using the standard protocol during bolus administration of intravenous contrast. Multiplanar CT image reconstructions and MIPs were obtained to evaluate the vascular anatomy. Carotid stenosis measurements (when applicable) are obtained utilizing NASCET criteria, using the distal internal carotid diameter as the denominator. RADIATION DOSE REDUCTION: This exam was performed according to the departmental dose-optimization program which includes automated exposure control, adjustment of the mA and/or kV according to patient size and/or use of iterative reconstruction technique. 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 No evidence of progression or hemorrhage. No hydrocephalus or shift. Vascular: See below Skull: No acute or aggressive Sinuses/Orbits: Finding sinonasal polyps. Chronic left sphenoid sinusitis with sclerotic wall thickening. Review of the MIP images confirms the above findings CTA NECK FINDINGS Aortic arch: Atheromatous plaque with  3 vessel branching. Right carotid system: Mainly calcified atherosclerosis at the bifurcation without stenosis or ulceration. Left carotid system: Mainly calcified atherosclerosis at the bifurcation without stenosis or ulceration. 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 The left vertebral artery is dominant. At the proximal right V2 segment there is hazy hypoenhancement of the right vertebral lumen. No ulceration,  beading, or dissection flap. Robust and symmetric flow by the V4 segments. Skeleton: Generalized cervical spine degeneration. Other neck: No acute finding Upper chest: Layering pleural effusions. Left IJ line with tip at the SVC. An enteric tube traverses the esophagus. Review of the MIP images confirms the above findings CTA HEAD FINDINGS Anterior circulation: Atheromatous plaque is mild. No major branch occlusion, beading, or irregularity. Hypoplastic right A1 segment. Negative for aneurysm. Posterior circulation: Left dominant vertebral artery. The vertebral and basilar arteries are smoothly contoured and diffusely patent. Mild atheromatous irregularity of the posterior cerebral arteries. Venous sinuses: Unremarkable Anatomic variants: None significant Review of the MIP images confirms the above findings IMPRESSION: 1. No emergent arterial finding. 2. 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 3. Short segment of right V2 hypoenhancement without discrete underlying stenosis or dissection flap. 4. No flow limiting stenosis in the anterior circulation. Electronically Signed   By: Tiburcio Pea M.D.   On: 08/26/2022 20:24   DG Abd Portable 1V  Result Date: 08/26/2022 CLINICAL DATA:  Feeding tube placement. EXAM: PORTABLE ABDOMEN - 1 VIEW COMPARISON:  August 25, 2022. FINDINGS: Distal tip of feeding tube is seen in expected position of distal stomach. IMPRESSION: Distal tip of feeding tube is seen in expected position of distal stomach. Electronically Signed   By: Lupita Raider M.D.   On: 08/26/2022 13:34   MR BRAIN WO CONTRAST  Result Date: 08/26/2022 CLINICAL DATA:  Neuro deficit, acute, stroke suspected neuro change, code stroke at 1830. EXAM: MRI HEAD WITHOUT CONTRAST TECHNIQUE: Multiplanar, multiecho pulse sequences of the brain and surrounding structures were obtained without intravenous contrast. COMPARISON:  Head CT 08/24/2022.  MRI brain 08/20/2022, 08/18/2022. FINDINGS:  Brain: Cortical restricted diffusion along the right-greater-than-left superior frontal sulci, as well as in the right parietal lobe and right occipital lobe, with associated edema and partial sulcal effacement, consistent with late acute infarction. Few foci of acute infarction in the left occipital lobe and left cerebellar hemisphere. No acute hemorrhage or significant mass effect. No hydrocephalus or extra-axial collection. Vascular: Normal flow voids. Skull and upper cervical spine: Normal marrow signal. Sinuses/Orbits: Unremarkable. Other: None. IMPRESSION: 1. 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 2. No acute hemorrhage or significant mass effect. Electronically Signed   By: Orvan Falconer M.D.   On: 08/26/2022 12:53   DG Abd 1 View  Result Date: 08/25/2022 CLINICAL DATA:  [id number] Encounter for imaging study to confirm orogastric (OG) tube placement [id number] EXAM: ABDOMEN - 1 VIEW COMPARISON:  08/22/2022 FINDINGS: Enteric tube terminates within the gastric antrum. There is a small amount of contrast within the gastric fundus. Included bowel gas pattern is nonobstructive. Overlying monitoring leads. IMPRESSION: Enteric tube terminates within the gastric antrum. Electronically Signed   By: Duanne Guess D.O.   On: 08/25/2022 10:54   EEG adult  Result Date: 08/25/2022 Charlsie Quest, MD     08/25/2022  9:09 AM Patient Name: Loletha Bertini MRN: [id number] Epilepsy Attending: Charlsie Quest Referring Physician/Provider: Caryl Pina, MD Date: 08/25/2022 Duration: 26.05 mins  Patient history: 72 y.o. female with left side weakness. EEG to evaluate for seizure. Level of alertness: Awake AEDs during EEG study: None Technical aspects: This EEG study was done with scalp electrodes positioned according to the 10-20 International system of electrode placement. Electrical activity was reviewed with band pass filter of 1-70Hz , sensitivity of 7 uV/mm,  display speed of 40mm/sec with a 60Hz  notched filter applied as appropriate. EEG data were recorded continuously and digitally stored.  Video monitoring was available and reviewed as appropriate. Description: No clear posterior dominant rhythm was seen. EEG showed continuous generalized 3 to 6 Hz theta-delta slowing, at times with triphasic morphology.  Hyperventilation and photic stimulation were not performed.   ABNORMALITY - Continuous slow, generalized IMPRESSION: This study is suggestive of moderate diffuse encephalopathy, nonspecific etiology but likely related to toxic-metabolic causes. No seizures or definite epileptiform discharges were seen throughout the recording. [name]   CT HEAD WO CONTRAST ( )  Result Date: 08/24/2022 CLINICAL DATA:  Neuro deficit, acute, stroke suspected New left sided weakness. EXAM: CT HEAD WITHOUT CONTRAST TECHNIQUE: Contiguous axial images were obtained from the base of the skull through the vertex without intravenous contrast. RADIATION DOSE REDUCTION: This exam was performed according to the departmental dose-optimization program which includes automated exposure control, adjustment of the mA and/or kV according to patient size and/or use of iterative reconstruction technique. COMPARISON:  08/18/2022 FINDINGS: Brain: There is questionable loss of gray-white differentiation in the high right parietal lobe on images 20 6-28. Cannot exclude acute infarct. The previously seen areas of abnormal punctate diffusion signal on prior MRI not appreciable by CT. There is atrophy and chronic small vessel disease changes. No hydrocephalus or hemorrhage. Vascular: No hyperdense vessel or unexpected calcification. Skull: No acute calvarial abnormality. Sinuses/Orbits: Mucosal thickening diffusely, most pronounced in the left frontal sinus, stable. No air-fluid levels. Other: None IMPRESSION: Questionable area of loss of gray-white differentiation in the high right parietal lobe  concerning for area of acute infarction. This could be further evaluated with MRI. Atrophy, chronic small vessel disease. Chronic sinusitis. Electronically Signed   By: Charlett Nose M.D.   On: 08/24/2022 18:06   DG CHEST PORT 1 VIEW  Result Date: 08/23/2022 CLINICAL DATA:  Central line care. EXAM: PORTABLE CHEST 1 VIEW COMPARISON:  08/22/2022. FINDINGS: The heart is enlarged and the mediastinal contour stable. There is a small left pleural effusion with atelectasis at the left lung base. No pneumothorax. The distal tip of the left internal jugular central venous catheter terminates over the right atrium. An enteric tube courses over the stomach and out of the field of view. An endotracheal tube terminates 5.3 cm above the carina. IMPRESSION: 1. Small left pleural effusion with atelectasis at the left lung base. 2. Left internal jugular central venous catheter terminates over the right atrium. 3. Remaining support apparatus as described above. Electronically Signed   By: Thornell Sartorius M.D.   On: 08/23/2022 03:54   DG Chest Port 1 View  Result Date: 08/22/2022 CLINICAL DATA:  Endotracheal tube. EXAM: PORTABLE CHEST 1 VIEW COMPARISON:  04/24/2022. FINDINGS: The heart size and mediastinal contours are within normal limits. Strandy atelectasis or infiltrate is present at the left lung base. There is a small left pleural effusion. The right lung is clear. No pneumothorax bilaterally. An enteric tube courses over the upper abdomen and out of the field of view. The endotracheal tube terminates 4.8 cm above the carina. IMPRESSION: 1. Small left pleural effusion with atelectasis or infiltrate  at the left lung base. 2. Support apparatus as described above. Electronically Signed   By: Thornell Sartorius M.D.   On: 08/22/2022 21:28   CT ABDOMEN PELVIS W CONTRAST  Addendum Date: 08/22/2022   ADDENDUM REPORT: 08/22/2022 16:51 ADDENDUM: Critical Value/emergent results were called by telephone at the time of interpretation on  08/22/2022 at 4:39 pm to provider PAULA GUENTHER , who verbally acknowledged these results. Electronically Signed   By: Genevive Bi M.D.   On: 08/22/2022 16:51   Result Date: 08/22/2022 CLINICAL DATA:  Bowel obstruction. EXAM: CT ABDOMEN AND PELVIS WITH CONTRAST TECHNIQUE: Multidetector CT imaging of the abdomen and pelvis was performed using the standard protocol following bolus administration of intravenous contrast. RADIATION DOSE REDUCTION: This exam was performed according to the departmental dose-optimization program which includes automated exposure control, adjustment of the mA and/or kV according to patient size and/or use of iterative reconstruction technique. CONTRAST:  75mL OMNIPAQUE IOHEXOL 350 MG/ML SOLN COMPARISON:  CT 08/19/2022 com plain film 08/22/2018 FINDINGS: Lower chest: Small bilateral pleural effusions. Hepatobiliary: No focal hepatic lesion. Normal gallbladder. No biliary duct dilatation. Common bile duct is normal. Pancreas: Pancreas is normal. No ductal dilatation. No pancreatic inflammation. Spleen: Normal spleen Adrenals/urinary tract: Adrenal glands normal. Severe hydronephrosis of the RIGHT kidney. Severe hydroureter on the RIGHT. Findings not changed from comparison exam. Hydroureter extends in the pelvis. LEFT kidney normal. Stomach/Bowel: NG tube in stomach. This fluid within the distal esophagus. The stomach is nondistended. Proximal small bowel is normal caliber. Again demonstrated marked distension of the colon. There is new intraperitoneal free air noted anterior to the liver (image 12/3). Scattered free air in the falciform ligament (image 14/3). Small amount of free air noted in the ventral peritoneal space of the mid abdomen. Minimal free fluid in the abdomen pelvis. No clear source of bowel perforation of presumably from the colon. Vascular/Lymphatic: Abdominal aorta is normal caliber. No periportal or retroperitoneal adenopathy. No pelvic adenopathy. Reproductive:  Calcified enlarged uterine fibroid. Complex mass in the RIGHT adnexa measuring 8.5 by 6.7 cm. Other: No free fluid. Musculoskeletal: No aggressive osseous lesion. IMPRESSION: 1. New intraperitoneal free air. Presumed perforation of the colon. No clear site of perforation identified. Minimal intraperitoneal free fluid. 2. Fluid within the esophagus presumably related to bowel obstruction. NG tube in stomach. 3. Proximal small bowel is nondistended. 4. Colon is markedly distended with air-fluid levels. 5. Severe hydronephrosis of the RIGHT kidney and RIGHT hydroureter. Potential obstructing RIGHT adnexal mass. Electronically Signed: By: Genevive Bi M.D. On: 08/22/2022 16:29   DG Abd 1 View  Result Date: 08/22/2022 CLINICAL DATA:  Bowel obstruction. EXAM: ABDOMEN - 1 VIEW COMPARISON:  Radiograph yesterday, CT 08/19/2022 FINDINGS: Gaseous bowel distension, colonic distention on recent CT, without significant interval change. Multiple ingested pills in the right abdomen. Calcified uterine fibroid as before. CT is pending at this time. IMPRESSION: Gaseous bowel distension, colonic distention on recent CT, without significant interval change. Electronically Signed   By: Narda Rutherford M.D.   On: 08/22/2022 15:58   DG Abd 1 View  Result Date: 08/22/2022 CLINICAL DATA:  Nasogastric tube placement.  Bowel obstruction. EXAM: ABDOMEN - 1 VIEW COMPARISON:  Earlier today FINDINGS: Tip and side port of the enteric tube below the diaphragm in the stomach. Gaseous bowel distention in the upper abdomen. IMPRESSION: Tip and side port of the enteric tube below the diaphragm in the stomach. Electronically Signed   By: Narda Rutherford M.D.   On: 08/22/2022 15:57  VAS [place] TRANSCRANIAL DOPPLER  Result Date: 08/21/2022  Transcranial Doppler Patient Name:  EESHA SCHMALTZ  Date of Exam:   08/21/2022 Medical Rec #: [id number]      Accession #:    [id number] Date of Birth: 1951/02/02      Patient Gender: F Patient Age:   25 years  Exam Location:  [facility] Procedure:      VAS [place] TRANSCRANIAL DOPPLER Referring Phys: Osvaldo Shipper --------------------------------------------------------------------------------  Indications: Stroke. Comparison Study: No prior studies. Performing Technologist: Jean Rosenthal RDMS, RVT  Examination Guidelines: A complete evaluation includes B-mode imaging, spectral Doppler, color Doppler, and power Doppler as needed of all accessible portions of each vessel. Bilateral testing is considered an integral part of a complete examination. Limited examinations for reoccurring indications may be performed as noted.  +----------+---------------+----------+-----------+------------------+ RIGHT TCD Right VM (cm/s)Depth (cm)Pulsatility     Comment       +----------+---------------+----------+-----------+------------------+ MCA             79                    1.17                       +----------+---------------+----------+-----------+------------------+ ACA             -36                   1.13                       +----------+---------------+----------+-----------+------------------+ Term ICA        53                    1.11                       +----------+---------------+----------+-----------+------------------+ PCA P1          -15                   0.46                       +----------+---------------+----------+-----------+------------------+ Opthalmic       26                    1.81                       +----------+---------------+----------+-----------+------------------+ ICA siphon      63                    1.30                       +----------+---------------+----------+-----------+------------------+ Vertebral                                     Bidirectional flow +----------+---------------+----------+-----------+------------------+  +----------+--------------+----------+-----------+------------------+ LEFT TCD  Left VM (cm/s)Depth  (cm)Pulsatility     Comment       +----------+--------------+----------+-----------+------------------+ MCA            101                   1.21                       +----------+--------------+----------+-----------+------------------+ ACA  Unable to insonate +----------+--------------+----------+-----------+------------------+ Term ICA        26                   1.45                       +----------+--------------+----------+-----------+------------------+ PCA P1          33                   1.04                       +----------+--------------+----------+-----------+------------------+ Opthalmic       23                   1.92                       +----------+--------------+----------+-----------+------------------+ ICA siphon      45                   1.34                       +----------+--------------+----------+-----------+------------------+ Vertebral      -32                   1.29                       +----------+--------------+----------+-----------+------------------+  +------------+-------+-------+             VM cm/sComment +------------+-------+-------+ Prox Basilar  -43          +------------+-------+-------+ Dist Basilar  -11          +------------+-------+-------+ Summary:  Elevated left middle cerebral artery mean flow velocities suggest mild stenosis.Mildy elevated right middle cerebral artery mean flow velcoities of unclear significance *See table(s) above for TCD measurements and observations.  Diagnosing physician: Delia Heady MD Electronically signed by Delia Heady MD on 08/21/2022 at 8:01:56 PM.    Final    VAS US CAROTID  Result Date: 08/21/2022 Carotid Arterial Duplex Study Patient Name:  LAURALIE BLACKSHER  Date of Exam:   08/21/2022 Medical Rec #: [id number]      Accession #:    [id number] Date of Birth: Jul 15, 1950      Patient Gender: F Patient Age:   77 years Exam Location:  [facility] Procedure:      VAS US CAROTID Referring Phys: Osvaldo Shipper --------------------------------------------------------------------------------  Indications:       CVA. Risk Factors:      Hypertension, hyperlipidemia. Comparison Study:  No prior studies. Performing Technologist: Jean Rosenthal RDMS, RVT  Examination Guidelines: A complete evaluation includes B-mode imaging, spectral Doppler, color Doppler, and power Doppler as needed of all accessible portions of each vessel. Bilateral testing is considered an integral part of a complete examination. Limited examinations for reoccurring indications may be performed as noted.  Right Carotid Findings: +----------+--------+--------+--------+------------------+---------+           PSV cm/sEDV cm/sStenosisPlaque DescriptionComments  +----------+--------+--------+--------+------------------+---------+ CCA Prox  185     18                                Turbulent +----------+--------+--------+--------+------------------+---------+ CCA Distal129     22                                          +----------+--------+--------+--------+------------------+---------+  ICA Prox  147     31      1-39%   calcific          tortuous  +----------+--------+--------+--------+------------------+---------+ ICA Distal118     27                                          +----------+--------+--------+--------+------------------+---------+ ECA       111     16                                          +----------+--------+--------+--------+------------------+---------+ +----------+--------+-------+--------+-------------------+           PSV cm/sEDV cmsDescribeArm Pressure (mmHG) +----------+--------+-------+--------+-------------------+ [id number]            Stenotic                    +----------+--------+-------+--------+-------------------+ +---------+--------+--+--------+---------------+ VertebralPSV cm/s21EDV cm/sBi-  directional +---------+--------+--+--------+---------------+  Left Carotid Findings: +----------+--------+--------+--------+------------------+--------+           PSV cm/sEDV cm/sStenosisPlaque DescriptionComments +----------+--------+--------+--------+------------------+--------+ CCA Prox  67      16                                         +----------+--------+--------+--------+------------------+--------+ CCA Distal77      17                                         +----------+--------+--------+--------+------------------+--------+ ICA Prox  89      25      1-39%   calcific                   +----------+--------+--------+--------+------------------+--------+ ICA Distal95      31                                         +----------+--------+--------+--------+------------------+--------+ ECA       80                                                 +----------+--------+--------+--------+------------------+--------+ +----------+--------+--------+----------------+-------------------+           PSV cm/sEDV cm/sDescribe        Arm Pressure (mmHG) +----------+--------+--------+----------------+-------------------+ [id number]              Multiphasic, WNL                    +----------+--------+--------+----------------+-------------------+ +---------+--------+--+--------+--+---------+ VertebralPSV cm/s74EDV cm/s14Antegrade +---------+--------+--+--------+--+---------+   Summary: Right Carotid: Velocities in the right ICA are consistent with a 1-39% stenosis. Left Carotid: Velocities in the left ICA are consistent with a 1-39% stenosis. Vertebrals:  Left vertebral artery demonstrates antegrade flow. Right vertebral              artery demonstrates bidirectional flow. Subclavians: Right subclavian artery was stenotic. Normal flow hemodynamics were  seen in the left subclavian artery. *See table(s) above for measurements and observations.   Electronically signed by Delia Heady MD on 08/21/2022 at 7:57:56 PM.    Final    DG Abd Portable 1V  Result Date: 08/21/2022 CLINICAL DATA:  [id number] Nausea and vomiting [id number] EXAM: PORTABLE ABDOMEN - 1 VIEW COMPARISON:  CT 08/19/2022 FINDINGS: Persistent dilated loops of colon. A few gas distended small bowel loops in the left mid abdomen. Calcified uterine fibroid. IMPRESSION: Persistent dilated colon suggesting distal obstruction. Electronically Signed   By: Corlis Leak M.D.   On: 08/21/2022 12:51   MR BRAIN W CONTRAST  Result Date: 08/20/2022 CLINICAL DATA:  Evaluate for metastatic disease. EXAM: MRI HEAD WITH CONTRAST TECHNIQUE: Multiplanar, multiecho pulse sequences of the brain and surrounding structures were obtained with intravenous contrast. CONTRAST:  7mL GADAVIST GADOBUTROL 1 MMOL/ML IV SOLN COMPARISON:  Noncontrast brain MRI from 2 days ago FINDINGS: Brain: Punctate enhancement in the right frontal parietal white matter on 5:38, associated with a focus of restricted diffusion. No generalized abnormal enhancement. Vascular: Major vessels are enhancing Skull and upper cervical spine: No focal marrow lesion. Sinuses/Orbits: Active bilateral sinusitis with maxillary fluid levels. Complete left frontal sinus opacification. Enhancing polypoid densities in the bilateral nasal cavity which are numerous. IMPRESSION: 1. Single punctate enhancement in the right frontal parietal white matter, a site of weakly restricted diffusion and compatible with subacute ischemia. If high concern for metastatic disease follow-up in 12 weeks could prove resolution. 2. Active sinusitis with nasal cavity polyps. Electronically Signed   By: Tiburcio Pea M.D.   On: 08/20/2022 12:01   CT ABDOMEN PELVIS W CONTRAST  Result Date: 08/19/2022 CLINICAL DATA:  Hydronephrosis, abnormal gallbladder on ultrasound abdomen distension EXAM: CT ABDOMEN AND PELVIS WITH CONTRAST TECHNIQUE: Multidetector CT imaging of the abdomen and pelvis  was performed using the standard protocol following bolus administration of intravenous contrast. RADIATION DOSE REDUCTION: This exam was performed according to the departmental dose-optimization program which includes automated exposure control, adjustment of the mA and/or kV according to patient size and/or use of iterative reconstruction technique. CONTRAST:  75mL OMNIPAQUE IOHEXOL 350 MG/ML SOLN COMPARISON:  Ultrasound 08/18/2022 FINDINGS: Lower chest: Lung bases demonstrate no acute airspace disease. Mild coronary vascular calcification. Borderline cardiomegaly. Small hiatal hernia Hepatobiliary: No calcified gallstone or biliary dilatation. No gas within the gallbladder lumen or gallbladder wall. Pancreas: Unremarkable. No pancreatic ductal dilatation or surrounding inflammatory changes. Spleen: Normal in size without focal abnormality. Adrenals/Urinary Tract: Adrenal glands are within normal limits. Severe right-sided hydronephrosis and hydroureter with dilated ureter visualized to the pelvis. No obstructing stone is seen. No significant excretion of contrast right kidney consistent with decreased renal function and obstruction. Decompressed urinary bladder Stomach/Bowel: Stomach nonenlarged. No dilated small bowel. Fluid and air distension of the colon, measuring up to 9.3 cm maximum at the right colon. Decompressed rectum with small volume stool. Unopacified bowel in the pelvis. Poorly identified sigmoid colon with suspicion of possible sigmoid colon mass in the right pelvis, series 3, image 70 but limited due to absence of enteral contrast. Vascular/Lymphatic: Moderate aortic atherosclerosis. No aneurysm. No suspicious lymph nodes. Reproductive: Enlarged uterus with large calcified uterine mass measuring 9 by 8.7 cm consistent with large calcified fibroid. As mentioned previously, ill-defined hypodense area in the right hemipelvis. Other: Negative for free air.  Trace volume fluid in the pelvis  Musculoskeletal: Grade 1 anterolisthesis L4 on L5. No acute osseous abnormality IMPRESSION: 1. Marked fluid and air distension of the majority  of colon with decompressed rectum and poorly delineated sigmoid colon. Ill-defined hypodensity/suspected mass within the right pelvis with central gas collection. Findings are suspect for colon obstruction, potentially due to sigmoid colon mass, suggest correlation with colonoscopy. There is additional finding of severe right-sided hydronephrosis and hydroureter with dilated ureter visualized to the level of right pelvic ill-defined hypodensity, consistent with distal ureteral obstruction by right pelvic mass. Other consideration for the right pelvic mass could include gynecologic malignancy. Trace free fluid in the pelvis. No free air. 2. Negative for gallstones or air within the gallbladder as questioned on prior ultrasound 3. Enlarged uterus with large calcified uterine mass consistent with large calcified fibroid. 4. Small hiatal hernia. 5. Aortic atherosclerosis. Aortic Atherosclerosis (ICD10-I70.0). These results will be called to the ordering clinician or representative by the Radiologist Assistant, and communication documented in the PACS or [employer]. Electronically Signed   By: Jasmine Pang M.D.   On: 08/19/2022 17:50   ECHOCARDIOGRAM LIMITED  Result Date: 08/19/2022    ECHOCARDIOGRAM LIMITED REPORT   Patient Name:   ALITZA COWMAN Date of Exam: 08/19/2022 Medical Rec #:  [id number]     Height:       66.0 in Accession #:    [id number]    Weight:       170.0 lb Date of Birth:  Mar 21, 1950     BSA:          1.866 m Patient Age:    72 years      BP:           133/68 mmHg Patient Gender: F             HR:           80 bpm. Exam Location:  Inpatient Procedure: Limited Echo and Color Doppler Indications:    stroke/pericardial effusion  History:        Patient has prior history of Echocardiogram examinations, most                 recent 04/25/2022. CHF, COPD; Risk  Factors:Hypertension and Former                 Smoker.  Sonographer:    Melissa Morford RDCS (AE, PE) Referring Phys: [id number] Osvaldo Shipper IMPRESSIONS  1. Left ventricular ejection fraction, by estimation, is 40 to 45%. The left ventricle has mildly decreased function. The left ventricle demonstrates global hypokinesis.  2. Right ventricular systolic function is normal. The right ventricular size is normal.  3. The mitral valve is normal in structure. Mild to moderate mitral valve regurgitation.  4. The aortic valve is tricuspid. Aortic valve regurgitation is not visualized.  5. Trivial pericardial effusion  6. The inferior vena cava is normal in size with greater than 50% respiratory variability, suggesting right atrial pressure of 3 mmHg. FINDINGS  Left Ventricle: Left ventricular ejection fraction, by estimation, is 40 to 45%. The left ventricle has mildly decreased function. The left ventricle demonstrates global hypokinesis. Right Ventricle: The right ventricular size is normal. No increase in right ventricular wall thickness. Right ventricular systolic function is normal. Pericardium: Trivial pericardial effusion is present. Mitral Valve: The mitral valve is normal in structure. Mild to moderate mitral valve regurgitation. Tricuspid Valve: The tricuspid valve is normal in structure. Tricuspid valve regurgitation is trivial. Aortic Valve: The aortic valve is tricuspid. Aortic valve regurgitation is not visualized. Pulmonic Valve: The pulmonic valve was not well visualized. Pulmonic valve regurgitation is not visualized. Venous: The inferior vena  cava is normal in size with greater than 50% respiratory variability, suggesting right atrial pressure of 3 mmHg. IAS/Shunts: The interatrial septum was not well visualized. Epifanio Lesches MD Electronically signed by Epifanio Lesches MD Signature Date/Time: 08/19/2022/1:35:21 PM    Final    MR BRAIN WO CONTRAST  Result Date: 08/18/2022 CLINICAL DATA:   Follow-up examination for stroke. EXAM: MRI HEAD WITHOUT CONTRAST TECHNIQUE: Multiplanar, multiecho pulse sequences of the brain and surrounding structures were obtained without intravenous contrast. COMPARISON:  CT from earlier the same day. FINDINGS: Brain: Cerebral volume within normal limits. Scattered patchy T2/FLAIR hyperintensity involving the periventricular deep white matter both cerebral hemispheres, consistent with chronic small vessel ischemic disease, mild-to-moderate in nature. Few scattered punctate foci of diffusion signal abnormality are seen involving the right parietal lobe (series 5, image 87), left frontal centrum semi ovale (series 7, image 64) and right cerebellum (series 5, image 64). Findings suspicious for tiny acute ischemic infarcts. No associated hemorrhage or mass effect. No other evidence for acute or subacute ischemia. Gray-white matter differentiation otherwise maintained. No acute or chronic intracranial blood products. No mass lesion, midline shift or mass effect. No hydrocephalus or extra-axial fluid collection. Pituitary gland and suprasellar region within normal limits. Vascular: Major intracranial vascular flow voids are maintained. Skull and upper cervical spine: Craniocervical junction within normal limits. Diffuse loss of normal bone marrow signal, nonspecific but can be seen with anemia, smoking, obesity, and infiltrative/myelofibrotic marrow processes. No focal marrow replacing lesion. Sinuses/Orbits: Prior bilateral ocular lens replacement. Scattered mucosal thickening and secretions noted about the frontoethmoidal, sphenoid, and maxillary sinuses. Scattered superimposed air-fluid levels. No mastoid effusion. Other: None. IMPRESSION: 1. Few scattered punctate foci of diffusion signal abnormality involving the right parietal lobe, left frontal centrum semi ovale, and right cerebellum, suspicious for tiny acute ischemic infarcts. No associated hemorrhage or mass effect. 2.  Underlying mild to moderate chronic microvascular ischemic disease. Electronically Signed   By: Rise Mu M.D.   On: 08/18/2022 20:24   US Abdomen Limited RUQ (LIVER/GB)  Result Date: 08/18/2022 CLINICAL DATA:  Cirrhosis evaluation EXAM: ULTRASOUND ABDOMEN LIMITED RIGHT UPPER QUADRANT COMPARISON:  None Available. FINDINGS: Gallbladder: Probable small amount of gallbladder sludge. Few echogenic foci with possible dirty shadowing. Negative sonographic Murphy. Common bile duct: Diameter: 3.4 mm Liver: Slightly coarse hepatic echotexture. No focal hepatic abnormality. Portal vein is patent on color Doppler imaging with normal direction of blood flow towards the liver. Other: Incidentally noted is moderate severe right hydronephrosis. Cortical thinning of right kidney IMPRESSION: 1. Slightly coarse hepatic echotexture without focal lesion. Possible subtle surface nodularity on some of the views as may be seen with early changes of cirrhosis. 2. Distended gallbladder, appears to contain sludge and echogenic foci some of which exhibit dirty shadowing, question air containing stones. No wall thickening or Murphy. Suggest correlation with CT to exclude air within the gallbladder. 3. Moderate to severe right hydronephrosis with cortical thinning of right kidney. This may also be assessed at CT follow-up. These results will be called to the ordering clinician or representative by the Radiologist Assistant, and communication documented in the PACS or [employer]. Electronically Signed   By: Jasmine Pang M.D.   On: 08/18/2022 19:42   CT Head Wo Contrast  Result Date: 08/18/2022 CLINICAL DATA:  Weakness since March, worsening over the last week. Decreased mobility. EXAM: CT HEAD WITHOUT CONTRAST TECHNIQUE: Contiguous axial images were obtained from the base of the skull through the vertex without intravenous contrast. RADIATION DOSE REDUCTION:  This exam was performed according to the departmental  dose-optimization program which includes automated exposure control, adjustment of the mA and/or kV according to patient size and/or use of iterative reconstruction technique. COMPARISON:  None Available. FINDINGS: Brain: There is no acute intracranial hemorrhage, extra-axial fluid collection, or acute territorial infarct. Parenchymal volume is normal for age. The ventricles are normal in size. Gray-white differentiation is preserved. There is mild background chronic small-vessel ischemic change. There an age-indeterminate but favored remote small infarct in the genu of the corpus callosum. The pituitary and suprasellar region are normal. There is no mass lesion. There is no mass effect or midline shift. Vascular: No hyperdense vessel or unexpected calcification. Skull: Normal. Negative for fracture or focal lesion. Sinuses/Orbits: There is extensive chronic pansinusitis with layering fluid in the maxillary and sphenoid sinuses. Bilateral lens implants are in place. The globes and orbits are otherwise unremarkable. Other: The mastoid air cells and middle ear cavities are clear. IMPRESSION: 1. 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 No other evidence of acute intracranial pathology. 2. Extensive chronic pansinusitis with possible superimposed acute sinusitis. Electronically Signed   By: Lesia Hausen M.D.   On: 08/18/2022 12:12    ASSESSMENT & PLAN:  72 yo female   Newly diagnosed terminal ileum adenocarcinoma, [id number] with peritoneal metastasis Septic shock secondary to acute peritonitis due to clonic perforation Acute CVA and PE Acute metabolic encephalopathy, resolved Hydronephrosis secondary to abdominal mass Acute on chronic renal disease, resolved  Iron deficient anemia and anemia of chronic disease Hypertension Chronic systolic CHF with EF 40 to 45%  Recommendations: -I have reviewed her chart extensively, including her surgical note, pathology  results, and multiple scan images. -Her primary tumor has been removed, but unfortunately she still has pelvic mass, likely direct extension from the tumor to her uterus, which has not been removed but biopsied. -Her hospital course has been very complicated with multiple serous illness and she is still slowly recovering -She would like to go to rehabilitation when she is ready  -She feels hungry, so hopefully if she tolerates oral intake adequately, but temporary feeding tube can be removed -We discussed the benefit of chemotherapy, when she recovers, likely in 1 to 2 months. -Will also discussed with GYN, to see if she is a candidate for debulking surgery of her pelvic mass down the road. She is definitely not initiated to have more surgery in the near future. -Her cancer is likely incurable and her overall prognosis is poor -No additional oncology work up needed in hospital, plan to see her back in 4-6 weeks, when she is released from rehab.  -Recommend oral anticoagulation such as Eliquis, indefinitely -will repeat iron study to see if she needs more iv iron.  -I spent a total of 60 minutes for her care today.   All questions were answered. The patient knows to call the clinic with any problems, questions or concerns.      Malachy Mood, MD 09/02/2022

## 2022-09-03 DIAGNOSIS — D62 Acute posthemorrhagic anemia: Secondary | ICD-10-CM | POA: Diagnosis not present

## 2022-09-03 DIAGNOSIS — J9601 Acute respiratory failure with hypoxia: Secondary | ICD-10-CM | POA: Diagnosis not present

## 2022-09-03 DIAGNOSIS — G9341 Metabolic encephalopathy: Secondary | ICD-10-CM | POA: Diagnosis not present

## 2022-09-03 DIAGNOSIS — C801 Malignant (primary) neoplasm, unspecified: Secondary | ICD-10-CM | POA: Diagnosis not present

## 2022-09-03 LAB — BASIC METABOLIC PANEL
Anion gap: 8 (ref 5–15)
BUN: 21 mg/dL (ref 8–23)
CO2: 15 mmol/L — ABNORMAL LOW (ref 22–32)
Calcium: 7.7 mg/dL — ABNORMAL LOW (ref 8.9–10.3)
Chloride: 110 mmol/L (ref 98–111)
Creatinine, Ser: 0.78 mg/dL (ref 0.44–1.00)
GFR, Estimated: >60 mL/min (ref >60)
Glucose, Bld: 110 mg/dL — ABNORMAL HIGH (ref 70–99)
Potassium: 3.6 mmol/L (ref 3.5–5.1)
Sodium: 133 mmol/L — ABNORMAL LOW (ref 135–145)

## 2022-09-03 LAB — BODY FLUID CULTURE W GRAM STAIN
Culture: NO GROWTH
Special Requests: NORMAL

## 2022-09-03 LAB — CBC
HCT: 29.2 % — ABNORMAL LOW (ref 36.0–46.0)
Hemoglobin: 8.8 g/dL — ABNORMAL LOW (ref 12.0–15.0)
MCH: 24.7 pg — ABNORMAL LOW (ref 26.0–34.0)
MCHC: 30.1 g/dL (ref 30.0–36.0)
MCV: 82 fL (ref 80.0–100.0)
Platelets: 1147 K/uL (ref 150–400)
RBC: 3.56 MIL/uL — ABNORMAL LOW (ref 3.87–5.11)
RDW: 26.9 % — ABNORMAL HIGH (ref 11.5–15.5)
WBC: 17.5 K/uL — ABNORMAL HIGH (ref 4.0–10.5)
nRBC: 0 % (ref 0.0–0.2)

## 2022-09-03 LAB — GLUCOSE, CAPILLARY
Glucose-Capillary: 96 mg/dL (ref 70–99)
Glucose-Capillary: 80 mg/dL (ref 70–99)
Glucose-Capillary: 87 mg/dL (ref 70–99)
Glucose-Capillary: 121 mg/dL — ABNORMAL HIGH (ref 70–99)
Glucose-Capillary: 97 mg/dL (ref 70–99)

## 2022-09-03 LAB — IRON AND TIBC
Iron: 15 ug/dL — ABNORMAL LOW (ref 28–170)
Saturation Ratios: 8 % — ABNORMAL LOW (ref 10.4–31.8)
TIBC: 197 ug/dL — ABNORMAL LOW (ref 250–450)
UIBC: 182 ug/dL

## 2022-09-03 LAB — BRAIN NATRIURETIC PEPTIDE: B Natriuretic Peptide: 117.1 pg/mL — ABNORMAL HIGH (ref 0.0–100.0)

## 2022-09-03 LAB — MAGNESIUM: Magnesium: 1.8 mg/dL (ref 1.7–2.4)

## 2022-09-03 LAB — FERRITIN: Ferritin: 281 ng/mL (ref 11–307)

## 2022-09-03 LAB — PHOSPHORUS: Phosphorus: 4.1 mg/dL (ref 2.5–4.6)

## 2022-09-03 LAB — HEPARIN LEVEL (UNFRACTIONATED)
Heparin Unfractionated: 0.91 IU/mL — ABNORMAL HIGH (ref 0.30–0.70)
Heparin Unfractionated: 0.14 [IU]/mL — ABNORMAL LOW (ref 0.30–0.70)

## 2022-09-03 MED ORDER — HEPARIN (PORCINE) 25000 UT/250ML-% IV SOLN
1750.0000 [IU]/h | INTRAVENOUS | Status: AC
  Administered 2022-09-03: 1700 [IU]/h via INTRAVENOUS
  Administered 2022-09-04: 1750 [IU]/h via INTRAVENOUS
  Filled 2022-09-03 (×3): qty 250

## 2022-09-03 MED ORDER — FUROSEMIDE 10 MG/ML IJ SOLN
20.0000 mg | Freq: Once | INTRAMUSCULAR | Status: AC
  Administered 2022-09-03: 20 mg via INTRAVENOUS
  Filled 2022-09-03: qty 2

## 2022-09-03 NOTE — Progress Notes (Signed)
Daily Progress Note   Patient Name: Debra Sharp       Date: 09/03/2022 DOB: [birth year]  Age: 72 y.o. MRN#: [id number] Attending Physician: Zigmund Daniel., * Primary Care Physician: Clayborn Heron, MD Admit Date: 08/18/2022  Reason for Consultation/Follow-up: Establishing goals of care  Subjective: Medical records reviewed including progress notes, labs, imaging. Patient assessed at the bedside.  She reports feeling a little better today.  No family present.  Created space and opportunity for patient's thoughts and feelings on her current illness.  Reviewed interval history since our last discussion.  Emotional support and therapeutic listening was provided as she shared her overall frustration with the lack of control she now has and how difficult it has been to adapt to her illness.  She just wants to go home and lay in her own bed, but ultimately she remains open to select LTACH upon discharge for continued aggressive care. She hopes for as much improvement as possible.  She is relieved to hear that her son will still be able to visit her there as well.  She greatly appreciated her visit from Dr. Mosetta Putt.  She noted it was frustrating to hear that she needs to eat as much as she can to regain her strength, while at the same time her bowels need more time to recover safely.  She feels it would be helpful for her overall stress if communication would be more clear between different teams.  CODE STATUS was considered and discussed in more detail.  She is willing to reflect further on this and discuss again with her son, continuing full code for the time being.  She understands that electing for DNR/DNI would not impact any of her medical treatments, but rather would go into effect if she were  to go into cardiopulmonary arrest despite treatment efforts.  Questions and concerns addressed. PMT will continue to support holistically.   Length of Stay: 16   Physical Exam Vitals and nursing note reviewed.  Constitutional:      General: She is not in acute distress.    Appearance: She is ill-appearing.     Comments: Cortrak in place  Cardiovascular:     Rate and Rhythm: Normal rate.  Pulmonary:     Effort: Pulmonary effort is normal. No respiratory distress.  Neurological:  Mental Status: She is alert.  Psychiatric:        Mood and Affect: Mood normal.        Behavior: Behavior normal.             Vital Signs: BP (!) 141/61 (BP Location: Right Leg)   Pulse 80   Temp 98.1 F (36.7 C) (Oral)   Resp 16   Ht 5' 5.98" (1.676 m)   Wt 78.1 kg   SpO2 100%   BMI 27.80 kg/m  SpO2: SpO2: 100 % O2 Device: O2 Device: Room Air O2 Flow Rate: O2 Flow Rate (L/min): 2 L/min      Palliative Assessment/Data: 20-30%    Palliative Care Assessment & Plan   Patient Profile: 72 y.o. female  with past medical history of iron deficiency anemia, HTN, HLD, thrombocytopenia, COPD, CHF presented to ED on 08/18/22 with complaints of generalized weakness, nausea/vomiting, and abdominal pain. Patient was admitted on 08/18/2022 with acute on chronic IDA/blood loss anemia, incidental finding of age indeterminate infarct, thrombocythemia, hypokalemia.  Neurology was consulted in the setting of incidental findings of acute stroke on MRI - it was felt strokes were cardiac source and TEE was recommended, which patient refused. Urology was consulted for management of right hydronephrosis - felt it is due to obstruction from pelvic mass; after mass was surgically removed, state kidney can not be obstructed for another 6 months without losing it. GI was consulted for anemia and abdominal imaging - plan was for colonoscopy and endoscopy, however patient did not tolerate the prep and upon further imaging CT  showed free air with concern for perforation and Surgery was consulted - patient underwent emergent exlap, creation of mucus fistula, right hemicolectomy, colostomy, and biopsy of pelvic mass on 7/6. She was subsequently intubated, on pressors, and admitted to ICU after surgery. She was extubated on 7/8 but remained encephalopathic, which has been gradually improving. Her hospitalization has been complicated by septic shock secondary to peritonitis as well as acute PE. 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 7/11 patient was stable enough for transfer out of the ICU.   Palliative Medicine was consulted for GOC in context of malignancy and incomplete resection.  Assessment: Goals of care conversation Acute PE Multiple acute embolic strokes Acute hypoxic respiratory failure status post extubation, improved Septic shock secondary to acute peritonitis due to chronic perforation/obstructing pelvic mass AKI, resolved  Recommendations/Plan: Continue full code/full scope treatment Patient will discuss CODE STATUS further with her son Patient remains hopeful for improvement with consideration of chemotherapy in the outpatient setting Ongoing goals of care discussions pending clinical course Psychosocial and emotional support provided PMT will see again over the weekend   Prognosis: Poor long-term prognosis  Discharge Planning: To Be Determined  Care plan was discussed with patient   MDM: High   Marelly Wehrman Jeni Salles, PA-C  Palliative Medicine Team Team phone # [phone]  Thank you for allowing the Palliative Medicine Team to assist in the care of this patient. Please utilize secure chat with additional questions, if there is no response within 30 minutes please call the above phone number.  Palliative Medicine Team providers are available by phone from 7am to 7pm daily and can be reached through the team cell phone.  Should this patient require assistance  outside of these hours, please call the patient's attending physician.

## 2022-09-03 NOTE — Progress Notes (Signed)
ANTICOAGULATION CONSULT NOTE - Follow-up Note  Pharmacy Consult for Heparin Indication:  pulmonary embolus with recent stroke on MRI on 7/10  Allergies  Allergen Reactions   Aspirin Other (See Comments)    Caused an asthma attack   Ibuprofen Swelling and Other (See Comments)    Angioedema and Asthma exacerbation   Sulfonamide Derivatives Swelling and Other (See Comments)    Face swelled SEVERELY   Penicillins Rash    Patient Measurements: Height: 5' 5.98" (167.6 cm) Weight:  (unable to do bed not working) IBW/kg (Calculated) : 59.26 Heparin Dosing Weight: 75 kg  Vital Signs: Temp: 97.5 F (36.4 C) (07/18 2000) Temp Source: Oral (07/18 2000) BP: 146/60 (07/18 2000) Pulse Rate: 90 (07/18 2043)  Labs: Recent Labs    09/01/22 0319 09/01/22 1214 09/02/22 0434 09/02/22 0608 09/02/22 1821 09/03/22 0729 09/03/22 2026  HGB 9.7*  --  8.5*  --   --  8.8*  --   HCT 32.2*  --  28.6*  --   --  29.2*  --   PLT 932*  --  1,044*  --   --  1,147*  --   HEPARINUNFRC <0.10*   < > 0.95*   < > 0.53 0.91* 0.14*  CREATININE 0.66  --  0.78  --   --  0.78  --    < > = values in this interval not displayed.    Estimated Creatinine Clearance: 69 mL/min (by C-G formula based on SCr of 0.78 mg/dL).   Medical History: Past Medical History:  Diagnosis Date   Anemia    Anxiety    Arthritis    Asthma    Blood transfusion without reported diagnosis    Cataract    removed bilateral   COPD (chronic obstructive pulmonary disease) (HCC)    Depression    GERD (gastroesophageal reflux disease)    Hyperlipidemia    Hypertension     Medications:  Medications Prior to Admission  Medication Sig Dispense Refill Last Dose   cetirizine (ZYRTEC) 10 MG tablet Take 1 tablet (10 mg total) by mouth daily. (Patient taking differently: Take 10 mg by mouth daily as needed for allergies or rhinitis.) 30 tablet 11 08/17/2022   ferrous sulfate 325 (65 FE) MG EC tablet Take 325 mg by mouth daily.   08/17/2022    fluticasone (FLONASE) 50 MCG/ACT nasal spray Place 2 sprays into both nostrils daily. (Patient taking differently: Place 2 sprays into both nostrils at bedtime as needed for allergies or rhinitis.) 16 g 6 08/16/2022   lisinopril (ZESTRIL) 40 MG tablet Take 40 mg by mouth in the morning.   08/17/2022   lovastatin (MEVACOR) 20 MG tablet Take 20 mg by mouth at bedtime.   08/17/2022   PROAIR HFA 108 (90 Base) MCG/ACT inhaler Inhale 2 puffs into the lungs every 6 (six) hours as needed for wheezing or shortness of breath.   08/17/2022   sertraline (ZOLOFT) 100 MG tablet Take 150 mg by mouth in the morning.   08/17/2022   SYMBICORT 160-4.5 MCG/ACT inhaler Inhale 2 puffs into the lungs in the morning and at bedtime.   08/17/2022   Scheduled:   acetaminophen (TYLENOL) oral liquid 160 mg/5 mL  650 mg Per Tube Q6H   arformoterol  15 mcg Nebulization BID   atorvastatin  20 mg Per Tube Daily   budesonide (PULMICORT) nebulizer solution  0.5 mg Nebulization BID   Chlorhexidine Gluconate Cloth  6 each Topical Daily   feeding supplement (PROSource TF20)  60 mL Per Tube Daily   ferrous sulfate  325 mg Oral BID WC   loperamide  2 mg Oral BID   melatonin  3 mg Oral QHS   multivitamin with minerals  1 tablet Per Tube Daily   pantoprazole (PROTONIX) IV  40 mg Intravenous Q12H   phosphorus  500 mg Per Tube TID   polycarbophil  625 mg Per Tube BID   revefenacin  175 mcg Nebulization Daily   sodium chloride flush  10-40 mL Intracatheter Q12H   tamsulosin  0.4 mg Oral Daily   Infusions:   sodium chloride Stopped (08/29/22 1855)   ceFEPime (MAXIPIME) IV 2 g (09/03/22 1539)   feeding supplement (VITAL 1.5 CAL) 1,000 mL (09/02/22 0116)   heparin 1,700 Units/hr (09/03/22 1137)   metronidazole 500 mg (09/03/22 1717)   micafungin (MYCAMINE) 100 mg in sodium chloride 0.9 % 100 mL IVPB 100 mg (09/03/22 1134)   promethazine (PHENERGAN) injection (IM or IVPB)     PRN: albuterol, hydrALAZINE, lip balm, morphine injection,  naLOXone (NARCAN)  injection, ondansetron **OR** ondansetron (ZOFRAN) IV, mouth rinse, oxyCODONE, promethazine (PHENERGAN) injection (IM or IVPB), sodium chloride flush  Assessment: 72 yof with a history of HTN, HLD, HF, thrombocytopenia, COPD, asthma. Heparin per pharmacy consult placed for  pulmonary embolus with recent stroke on MRI on 7/10 .  Previously on 1850 units/hr IV heparin. Resulting heparin level was 0.91 which is supra-therapeutic. Heparin was then reduced to 1700 units/hr. Resulting heparin level back at 0.14 which is now sub-therapeutic.  No issues per RN.  Hgb 8.8; plt 1147  Goal of Therapy:  Heparin level 0.3-0.5 units/ml Monitor platelets by anticoagulation protocol: Yes   Plan:  No heparin bolus Increase heparin infusion to 1800 units/hr Check anti-Xa level at 0400 and daily while on heparin Continue to monitor H&H and platelets  Delmar Landau, PharmD, BCPS 09/03/2022 9:19 PM ED Clinical Pharmacist -  [phone]

## 2022-09-03 NOTE — Progress Notes (Signed)
Pharmacy Antibiotic Note  Debra Sharp is a 72 y.o. female admitted on 08/18/2022 with septic shock/colonic perforation secondary to obstructive pelvis mass, UTI. Pharmacy has been consulted for Cefepime dosing. Day 12 of broad spectrum abx. Leukocytosis, afebrile. Scr 0.78.  Micro results: 7/15 Ucx: negative 7/6 BCx - negative 7/6 MRSA PCR - negative  Plan: Continue Cefepime 2g IV Q8H, Flagyl 500mg  IV Q12H and Micafungin 100mg  IV Q24H per MD Monitor WBC, temp, renal function, abx length of therapy  Height: 5' 5.98" (167.6 cm) Weight:  (unable to do bed not working) IBW/kg (Calculated) : 59.26  Temp (24hrs), Avg:98 F (36.7 C), Min:97.8 F (36.6 C), Max:98.1 F (36.7 C)  Recent Labs  Lab 08/30/22 0123 08/31/22 0150 09/01/22 0319 09/02/22 0434 09/03/22 0729  WBC 24.5* 20.3* 18.6* 16.2* 17.5*  CREATININE 0.79 0.71 0.66 0.78 0.78    Estimated Creatinine Clearance: 69 mL/min (by C-G formula based on SCr of 0.78 mg/dL).    Allergies  Allergen Reactions   Aspirin Other (See Comments)    Caused an asthma attack   Ibuprofen Swelling and Other (See Comments)    Angioedema and Asthma exacerbation   Sulfonamide Derivatives Swelling and Other (See Comments)    Face swelled SEVERELY   Penicillins Rash   Thank you for involving pharmacy in the patient's care.   Theotis Burrow, PharmD PGY1 Acute Care Pharmacy Resident  09/03/2022 9:34 AM

## 2022-09-03 NOTE — Progress Notes (Signed)
PROGRESS NOTE    Debra Sharp  NWG:[id number] DOB: 04-08-1950 DOA: 08/18/2022 PCP: Clayborn Heron, MD  Chief Complaint  Patient presents with   Weakness    Brief Narrative:   72 year old female with past medical history of hypertension, hyperlipidemia, systolic CHF, thrombocytopenia, COPD, asthma who initially presented with weakness, abdominal pain. Workup showed anemia concerning for upper GI bleed. CT abdomen/pelvis showed colonic distention, possible obstruction. GI consulted, plan was for EGD but she could not tolerate prep. Also found to have right-sided hydronephrosis, incidental acute stroke on CT/MRI. Repeat CT abdomen/pelvis on 7/6 showed new intraperitoneal free air with presumed perforation of the colon. Surgery consulted and she underwent emergent exploratory laparotomy, right hemicolectomy, ileostomy with fistula, biopsy of pelvic mass. Patient was left intubated and was transferred to ICU for pain management. 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 Palliative care consulted. General surgery following. Currently on tube feeding. Hospital course also remarkable for finding of acute PE now on heparin drip. Now extubated and transferred to [facility] service on 7/12. Plan was for TEE which has been canceled because patient declined. Palliative care, urology ,surgery ,neurology were following.    Currently titrating tube feeds.  Surgery following and considering repeat imaging.   Assessment & Plan:   Principal Problem:   GI bleed Active Problems:   Symptomatic anemia   Thrombocytosis   Iron deficiency anemia   Abnormal CT of the abdomen   Colon distention   History of cardioembolic cerebrovascular accident (CVA)   Silent cerebral infarction (HCC)   Bowel perforation (HCC)   Ventilator dependent (HCC)   Pelvic mass   Change in bowel habits   Abnormal CT scan, gastrointestinal tract   Arterial hypotension   Septic shock (HCC)   Fecal occult blood test  positive   Uterine leiomyoma   Hydroureteronephrosis   Sepsis with acute hypoxic respiratory failure and septic shock (HCC)   Encephalopathy acute   Perforation bowel (HCC)   Malnutrition of moderate degree   Cerebrovascular accident (CVA) due to embolism of precerebral artery (HCC)   Acute metabolic encephalopathy   Delirium   Adenocarcinoma of small bowel (HCC)  Septic shock secondary to acute peritonitis due to colonic perforation due to obstructing pelvic mass: Status post exploratory laparotomy with right hemicolectomy, ileostomy creation, mucus fistual formation, biopsy of pelvic mass, and intraoperative of vasculature using indocyanine green.   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   Currently in broad spectrum antiinfectives with micafungin, Flagyl, cefepime.  culture NGTD.   On tube feeds, will continue for until she's able to maintain adequate PO intake by mouth. RD c/s for tube feeds.  General surgery following, appreciate recs.   IR had been consulted for drainage of intraabdominal fluid collections, but they thought intraabdominal fluid collections small and appeared c/w post op blood Repeat CT scan being considered by surgery if worsening fever, leukocytosis, etc Leukocytosis persist.  CT 7/13 with small volume fluid within abdomen and pelvis with several collections demonstrating rim enhancement suggestive of abscesses, new, near complete hypoattenuation of splenic parenchyma with subcapsular low density fluid along peripheral margin of spleen, multiple mildly dilated smal bowel loops with numerous air fluid levels (post op ileus), severe R hydroureteronephrosis, new mild left hydronephrosis.  Small bilateral effusions.  Large complex mass occupying majority of low right pelvis.    Newly Diagnosed Terminal Ileum Adenocarcinoma with Peritoneal Metastasis: Culprit for colonic perforation.  Initially concern for gynecological malignancy with elevated  CA125, CA 19-19 but pathology  showed small bowel primary.  Appreciate Dr. Latanya Maudlin recommendations.  Possible chemo after she recovers in 1-2 months.  ? Gyn follow up for debulking surgery of pelvic mass.  Overall prognosis is poor.  Recommending indefinite anticoagulation   Chronic systolic CHF: Echo on 08/18/2020 showed EF of 40 to 45%, global hypokinesis, normal right ventricular function.  Previous echo on March 2024 showed EF of 30 to 35%.  We recommend follow-up with cardiology as an outpatient She has bilateral LE edema - follow BNP Trial low dose lasix and follow   Acute metabolic encephalopathy: Likely from sepsis, delirium, CVAs.  This morning she remains alert oriented.  Confusion is intermittent.  -repeat CT brain neg for acute findings -awake and alert   Acute CVA: MRI 7/4 with single punctate enhancement in the R frontal parietal white matter (if concern for metastatic disease, follow up in 12 weeks) - MRI done on 7/10 with acute and late acute infarcts in multiple vascular territories consistent with central embolic source.   Appreciate neurology recs -> bilateral anterior/posterior infarcts with pattern more concerning for hypotension in setting of severe anemia and septic shock with hypotension (ddx includes hypercoagulable state with suspected malignancy).  Recommending transition to PO anticoagulation per primary team.  Atorvastatin 20 mg.  EEG done on 7/9 did not show any seizure.  There was plan for TEE but patient has declined. .  Recommended SNF on discharge She has left-sided weakness. Hardly  able to get up from the bed.  Currently on heparin drip, will change to Eliquis  when she does not need any more surgery and is able to tolerate po intake   Acute PE: CT angiogram showed acute PE.  Currently on heparin drip.  No evidence of right heart strain.  Will start her on Eliquis when surgery clears   AKI/hyperkalemia/hypophosphatemia:   Currently kidney function is stable.Phosphorus  being supplemented   Hydronephrosis secondary to abdominal mass: Urology following.  Follow-up CT showed persistent severe right hydroureteronephrosis.  IR did  PCNL placement on 7/15 (pulled overnight 7/15) -urology saw pt 7/16--and still flushes easily -mild left hydro will need to be followed   Acute on chronic renal disease anemia/acute blood loss anemia secondary to surgery/chronic GI bleed due to small bowel adenocarcinoma: Monitor hemoglobin.  Transfuse if less than 7.  Currently on PPI. Relatively stable.    Hypertension: Off hypertensives.  Monitor blood pressure   Hyperlipidemia: Currently on Lipitor   Hyperglycemia: A1c 5.4, d/c SSI Currently on sliding scale insulin.     Left upper extremity edema:  Negative for DVT   Goals of care: Multiple comorbidities.  Palliative care were following.  She's currently full code/full scope of care.     DVT prophylaxis: heparin gtt Code Status: full Family Communication: none Disposition:   Status is: Inpatient Remains inpatient appropriate because: need for continued inpatient care   Consultants:  Surgery Urology palliative  Procedures:  Echo IMPRESSIONS     1. Left ventricular ejection fraction, by estimation, is 40 to 45%. The  left ventricle has mildly decreased function. The left ventricle  demonstrates global hypokinesis.   2. Right ventricular systolic function is normal. The right ventricular  size is normal.   3. The mitral valve is normal in structure. Mild to moderate mitral valve  regurgitation.   4. The aortic valve is tricuspid. Aortic valve regurgitation is not  visualized.   5. Trivial pericardial effusion   6. The inferior vena cava is normal in size with greater  than 50%  respiratory variability, suggesting right atrial pressure of 3 mmHg.   EEG IMPRESSION: This study is suggestive of moderate diffuse encephalopathy, nonspecific etiology but likely related to toxic-metabolic causes. No seizures or  definite epileptiform discharges were seen throughout the recording.  Upper Extremity US Summary:    Left:  No evidence of deep vein thrombosis in the upper extremity. No evidence of  superficial vein thrombosis in the upper extremity. However, unable to  visualize  the basilic vein.   LE [place] Summary:  BILATERAL:  -No evidence of popliteal cyst, bilaterally.  RIGHT:  - There is no evidence of deep vein thrombosis in the lower extremity.  However, portions of this examination were limited- see technologist  comments above.    LEFT:  - There is no evidence of deep vein thrombosis in the lower extremity.  However, portions of this examination were limited- see technologist  comments above.   Carotid US Summary:  Right Carotid: Velocities in the right ICA are consistent with Demitrios Molyneux 1-39%  stenosis.   Left Carotid: Velocities in the left ICA are consistent with Jahyra Sukup 1-39%  stenosis.   Vertebrals: Left vertebral artery demonstrates antegrade flow. Right  vertebral              artery demonstrates bidirectional flow.  Subclavians: Right subclavian artery was stenotic. Normal flow  hemodynamics were               seen in the left subclavian artery.   Transcranial [place]   Summary:      Elevated left middle cerebral artery mean flow velocities suggest mild  stenosis.Mildy elevated right middle cerebral artery mean flow velcoities  of unclear significance     Antimicrobials:  Anti-infectives (From admission, onward)    Start     Dose/Rate Route Frequency Ordered Stop   08/30/22 0830  ceFEPIme (MAXIPIME) 2 g in sodium chloride 0.9 % 100 mL IVPB        2 g 200 mL/hr over 30 Minutes Intravenous Every 8 hours 08/30/22 0826     08/23/22 2100  ceFEPIme (MAXIPIME) 2 g in sodium chloride 0.9 % 100 mL IVPB  Status:  Discontinued        2 g 200 mL/hr over 30 Minutes Intravenous Every 12 hours 08/23/22 1357 08/30/22 0826   08/23/22 0400  micafungin (MYCAMINE) 100 mg in sodium chloride  0.9 % 100 mL IVPB        100 mg 105 mL/hr over 1 Hours Intravenous Daily 08/23/22 0242     08/22/22 1800  metroNIDAZOLE (FLAGYL) IVPB 500 mg        500 mg 100 mL/hr over 60 Minutes Intravenous Every 12 hours 08/22/22 1711     08/22/22 1800  ceFEPIme (MAXIPIME) 2 g in sodium chloride 0.9 % 100 mL IVPB  Status:  Discontinued        2 g 200 mL/hr over 30 Minutes Intravenous Every 8 hours 08/22/22 1716 08/23/22 1357   08/22/22 1724  metroNIDAZOLE (FLAGYL) 500 MG/100ML IVPB       Note to Pharmacy: [name]: cabinet override      08/22/22 1724 08/23/22 0845   08/19/22 1030  cefTRIAXone (ROCEPHIN) 1 g in sodium chloride 0.9 % 100 mL IVPB  Status:  Discontinued        1 g 200 mL/hr over 30 Minutes Intravenous Every 24 hours 08/19/22 0937 08/22/22 1715       Subjective: Asking about lower extremity swelling  Objective: Vitals:  09/03/22 0351 09/03/22 0811 09/03/22 1200 09/03/22 1600  BP: (!) 153/63 (!) 141/61 (!) 141/58 134/67  Pulse: 91 80 93 (!) 105  Resp: 16 16 16 19   Temp: 98 F (36.7 C) 98.1 F (36.7 C) 98.7 F (37.1 C) 98.2 F (36.8 C)  TempSrc: Oral Oral Oral Oral  SpO2: 100% 100% 98% 99%  Weight:      Height:        Intake/Output Summary (Last 24 hours) at 09/03/2022 1743 Last data filed at 09/03/2022 1450 Gross per 24 hour  Intake 923.21 ml  Output 3025 ml  Net -2101.79 ml   Filed Weights   08/31/22 0443 09/01/22 0400 09/02/22 0432  Weight: 81.7 kg 77.2 kg 78.1 kg    Examination:  General exam: Appears calm and comfortable  Respiratory system: unlabored Cardiovascular system: RRR Gastrointestinal system: Abdomen is distended, ostomy, drain Central nervous system: Alert and oriented. No focal neurological deficits. Extremities: bilateral LE edema   Data Reviewed: I have personally reviewed following labs and imaging studies  CBC: Recent Labs  Lab 08/30/22 0123 08/31/22 0150 09/01/22 0319 09/02/22 0434 09/03/22 0729  WBC 24.5* 20.3* 18.6*  16.2* 17.5*  HGB 9.2* 9.4* 9.7* 8.5* 8.8*  HCT 31.1* 31.3* 32.2* 28.6* 29.2*  MCV 82.3 83.0 81.5 81.5 82.0  PLT 467* 655* 932* 1,044* 1,147*    Basic Metabolic Panel: Recent Labs  Lab 08/29/22 0436 08/30/22 0123 08/31/22 0150 09/01/22 0319 09/02/22 0434 09/03/22 0729  NA  --  137 133* 136 134* 133*  K  --  4.0 4.1 4.0 3.8 3.6  CL  --  106 108 111 111 110  CO2  --  21* 17* 17* 18* 15*  GLUCOSE  --  127* 130* 92 110* 110*  BUN  --  30* 24* 20 19 21   CREATININE  --  0.79 0.71 0.66 0.78 0.78  CALCIUM  --  7.5* 7.5* 8.0* 7.7* 7.7*  MG 1.7 1.8  --   --   --  1.8  PHOS 2.6 2.4* 2.8 3.8  --  4.1    GFR: Estimated Creatinine Clearance: 69 mL/min (by C-G formula based on SCr of 0.78 mg/dL).  Liver Function Tests: Recent Labs  Lab 08/31/22 0150  AST 17  ALT 12  ALKPHOS 91  BILITOT 0.5  PROT 4.7*  ALBUMIN 1.5*    CBG: Recent Labs  Lab 09/02/22 2351 09/03/22 0357 09/03/22 0806 09/03/22 1317 09/03/22 1651  GLUCAP 75 96 80 87 121*     Recent Results (from the past 240 hour(s))  Body fluid culture w Gram Stain     Status: None   Collection Time: 08/31/22  5:03 PM   Specimen: Urine, Random; Body Fluid  Result Value Ref Range Status   Specimen Description URINE, RANDOM  Final   Special Requests Normal  Final   Gram Stain   Final    WBC PRESENT, PREDOMINANTLY MONONUCLEAR NO ORGANISMS SEEN CYTOSPIN SMEAR    Culture   Final    NO GROWTH 3 DAYS Performed at [facility], [address] [place], [place] [address]    Report Status 09/03/2022 FINAL  Final         Radiology Studies: No results found.      Scheduled Meds:  acetaminophen (TYLENOL) oral liquid 160 mg/5 mL  650 mg Per Tube Q6H   arformoterol  15 mcg Nebulization BID   atorvastatin  20 mg Per Tube Daily   budesonide (PULMICORT) nebulizer solution  0.5  mg Nebulization BID   Chlorhexidine Gluconate Cloth  6 each Topical Daily   feeding supplement (PROSource TF20)  60 mL Per Tube Daily    ferrous sulfate  325 mg Oral BID WC   insulin aspart  0-9 Units Subcutaneous Q4H   loperamide  2 mg Oral BID   melatonin  3 mg Oral QHS   multivitamin with minerals  1 tablet Per Tube Daily   pantoprazole (PROTONIX) IV  40 mg Intravenous Q12H   phosphorus  500 mg Per Tube TID   polycarbophil  625 mg Per Tube BID   revefenacin  175 mcg Nebulization Daily   sodium chloride flush  10-40 mL Intracatheter Q12H   tamsulosin  0.4 mg Oral Daily   Continuous Infusions:  sodium chloride Stopped (08/29/22 1855)   ceFEPime (MAXIPIME) IV 2 g (09/03/22 1539)   feeding supplement (VITAL 1.5 CAL) 1,000 mL (09/02/22 0116)   heparin 1,700 Units/hr (09/03/22 1137)   metronidazole 500 mg (09/03/22 1717)   micafungin (MYCAMINE) 100 mg in sodium chloride 0.9 % 100 mL IVPB 100 mg (09/03/22 1134)   promethazine (PHENERGAN) injection (IM or IVPB)       LOS: 16 days    Time spent: oer 30 min    Lacretia Nicks, MD [facility]   To contact the attending provider between 7A-7P or the covering provider during after hours 7P-7A, please log into the web site [email or web address] and access using universal [place] password for that web site. If you do not have the password, please call the hospital operator.  09/03/2022, 5:43 PM

## 2022-09-03 NOTE — Progress Notes (Signed)
ANTICOAGULATION CONSULT NOTE   Pharmacy Consult for heparin  Indication: pulmonary embolus with recent stroke on MRI on 7/10  Allergies  Allergen Reactions   Aspirin Other (See Comments)    Caused an asthma attack   Ibuprofen Swelling and Other (See Comments)    Angioedema and Asthma exacerbation   Sulfonamide Derivatives Swelling and Other (See Comments)    Face swelled SEVERELY   Penicillins Rash    Patient Measurements: Height: 5' 5.98" (167.6 cm) Weight:  (unable to do bed not working) IBW/kg (Calculated) : 59.26 Heparin Dosing Weight: 75 kg  Vital Signs: Temp: 98.1 F (36.7 C) (07/18 0811) Temp Source: Oral (07/18 0811) BP: 141/61 (07/18 0811) Pulse Rate: 80 (07/18 0811)  Labs: Recent Labs    09/01/22 0319 09/01/22 1214 09/02/22 0434 09/02/22 0608 09/02/22 0735 09/02/22 1821 09/03/22 0729  HGB 9.7*  --  8.5*  --   --   --  8.8*  HCT 32.2*  --  28.6*  --   --   --  29.2*  PLT 932*  --  1,044*  --   --   --  1,147*  HEPARINUNFRC <0.10*   < > 0.95*   < > >1.10* 0.53 0.91*  CREATININE 0.66  --  0.78  --   --   --  0.78   < > = values in this interval not displayed.    Estimated Creatinine Clearance: 69 mL/min (by C-G formula based on SCr of 0.78 mg/dL).  Assessment: 72 y.o. female with PE for heparin. Of note: patient with New stroke - Seen on MRI 7/10, suspected thromboembolic.  Heparin level elevated at 0.91 on 1850 units/hr. No issues with heparin infusion or signs of bleeding. Reached out to attending to discuss potential switch to Eliquis  Goal of Therapy:  Heparin level 0.3-0.5 units/ml Monitor platelets by anticoagulation protocol: Yes  Plan:  Hold heparin infusion for ~30 min and then decrease to 1700 units/hr Check heparin level in 8 hours and daily while on heparin Continue to monitor H&H and platelets F/u switch to oral therapy   Thank you for involving pharmacy in the patient's care.   Theotis Burrow, PharmD PGY1 Acute Care Pharmacy  Resident  09/03/2022 9:45 AM

## 2022-09-03 NOTE — Progress Notes (Signed)
12 Days Post-Op  Subjective: CC: No abdominal pain. Tolerating tf's (still at 64ml/hr) and CLD without n/v Having ostomy output (increased to 1350cc/24 hours). Drain output 65cc/24 hours, more thin purulent drainage today.   Afebrile. No tacycardia. No systolic hypotension. WBC trending up today (24.5 > 20.3 > 18.6 > 16.2 > 17.5)  Objective: Vital signs in last 24 hours: Temp:  [97.8 F (36.6 C)-98.1 F (36.7 C)] 98.1 F (36.7 C) (07/18 0811) Pulse Rate:  [80-94] 80 (07/18 0811) Resp:  [15-18] 16 (07/18 0811) BP: (141-154)/(61-68) 141/61 (07/18 0811) SpO2:  [96 %-100 %] 100 % (07/18 0811) Last BM Date : 09/02/22  Intake/Output from previous day: 07/17 0701 - 07/18 0700 In: 2596.2 [P.O.:240; I.V.:386.3; NG/GT:938.3; IV Piggyback:1031.5] Out: 2325 [Urine:910; Drains:65; Stool:1350] Intake/Output this shift: Total I/O In: -  Out: 400 [Urine:400]  PE: Gen:  NAD, pleasant Abd: Soft, mild distension, NT. Stoma and mucus fistula viable - ostomy appliance with thciker brown output in bag.  Midline wound overall clean with some fibrinous tissue at the base with granulation tissue otherwise. JP drain with scant output that appears more thin purulent drainage today. No longer feculent appearing  Lab Results:  Recent Labs    09/02/22 0434 09/03/22 0729  WBC 16.2* 17.5*  HGB 8.5* 8.8*  HCT 28.6* 29.2*  PLT 1,044* 1,147*   BMET Recent Labs    09/02/22 0434 09/03/22 0729  NA 134* 133*  K 3.8 3.6  CL 111 110  CO2 18* 15*  GLUCOSE 110* 110*  BUN 19 21  CREATININE 0.78 0.78  CALCIUM 7.7* 7.7*   PT/INR No results for input(s): "LABPROT", "INR" in the last 72 hours. CMP     Component Value Date/Time   NA 133 (L) 09/03/2022 0729   K 3.6 09/03/2022 0729   CL 110 09/03/2022 0729   CO2 15 (L) 09/03/2022 0729   GLUCOSE 110 (H) 09/03/2022 0729   BUN 21 09/03/2022 0729   CREATININE 0.78 09/03/2022 0729   CALCIUM 7.7 (L) 09/03/2022 0729   PROT 4.7 (L) 08/31/2022 0150    ALBUMIN 1.5 (L) 08/31/2022 0150   AST 17 08/31/2022 0150   ALT 12 08/31/2022 0150   ALKPHOS 91 08/31/2022 0150   BILITOT 0.5 08/31/2022 0150   GFRNONAA >60 09/03/2022 0729   GFRAA >60 05/11/2015 0231   Lipase  No results found for: "LIPASE"  Studies/Results: VAS [place] UPPER EXTREMITY VENOUS DUPLEX  Result Date: 09/01/2022 UPPER VENOUS STUDY  Patient Name:  Debra Sharp  Date of Exam:   09/01/2022 Medical Rec #: [id number]      Accession #:    [id number] Date of Birth: 04/23/1950      Patient Gender: F Patient Age:   72 years Exam Location:  [facility] Procedure:      VAS [place] UPPER EXTREMITY VENOUS DUPLEX Referring Phys: AMRIT ADHIKARI --------------------------------------------------------------------------------  Indications: edema Anticoagulation: Heparin. Limitations: Body habitus, poor ultrasound/tissue interface and patient positioning. Comparison Study: No previous study. Performing Technologist: McKayla Maag RVT, [place]  Examination Guidelines: A complete evaluation includes B-mode imaging, spectral Doppler, color Doppler, and power Doppler as needed of all accessible portions of each vessel. Bilateral testing is considered an integral part of a complete examination. Limited examinations for reoccurring indications may be performed as noted.  Right Findings: +----------+------------+---------+-----------+----------+--------------+ RIGHT     CompressiblePhasicitySpontaneousProperties   Summary     +----------+------------+---------+-----------+----------+--------------+ Subclavian  Not visualized +----------+------------+---------+-----------+----------+--------------+  Left Findings: +----------+------------+---------+-----------+----------+--------------+ LEFT      CompressiblePhasicitySpontaneousProperties   Summary     +----------+------------+---------+-----------+----------+--------------+ IJV           Full       Yes        Yes                             +----------+------------+---------+-----------+----------+--------------+ Subclavian    Full       Yes       Yes                             +----------+------------+---------+-----------+----------+--------------+ Axillary      Full       Yes       Yes                             +----------+------------+---------+-----------+----------+--------------+ Brachial      Full       Yes       Yes                             +----------+------------+---------+-----------+----------+--------------+ Radial        Full                                                 +----------+------------+---------+-----------+----------+--------------+ Ulnar         Full                                     limited     +----------+------------+---------+-----------+----------+--------------+ Cephalic      Full                                                 +----------+------------+---------+-----------+----------+--------------+ Basilic                                             Not visualized +----------+------------+---------+-----------+----------+--------------+  Summary:  Left: No evidence of deep vein thrombosis in the upper extremity. No evidence of superficial vein thrombosis in the upper extremity. However, unable to visualize the basilic vein.  *See table(s) above for measurements and observations.  Diagnosing physician: Waverly Ferrari MD Electronically signed by Waverly Ferrari MD on 09/01/2022 at 4:49:19 PM.    Final    [place] EKG SITE RITE  Result Date: 09/01/2022 If Site Rite image not attached, placement could not be confirmed due to current cardiac rhythm.   Anti-infectives: Anti-infectives (From admission, onward)    Start     Dose/Rate Route Frequency Ordered Stop   08/30/22 0830  ceFEPIme (MAXIPIME) 2 g in sodium chloride 0.9 % 100 mL IVPB        2 g 200 mL/hr over 30 Minutes Intravenous Every 8 hours 08/30/22 0826      08/23/22 2100  ceFEPIme (MAXIPIME) 2 g in sodium chloride 0.9 %  100 mL IVPB  Status:  Discontinued        2 g 200 mL/hr over 30 Minutes Intravenous Every 12 hours 08/23/22 1357 08/30/22 0826   08/23/22 0400  micafungin (MYCAMINE) 100 mg in sodium chloride 0.9 % 100 mL IVPB        100 mg 105 mL/hr over 1 Hours Intravenous Daily 08/23/22 0242     08/22/22 1800  metroNIDAZOLE (FLAGYL) IVPB 500 mg        500 mg 100 mL/hr over 60 Minutes Intravenous Every 12 hours 08/22/22 1711     08/22/22 1800  ceFEPIme (MAXIPIME) 2 g in sodium chloride 0.9 % 100 mL IVPB  Status:  Discontinued        2 g 200 mL/hr over 30 Minutes Intravenous Every 8 hours 08/22/22 1716 08/23/22 1357   08/22/22 1724  metroNIDAZOLE (FLAGYL) 500 MG/100ML IVPB       Note to Pharmacy: [name]: cabinet override      08/22/22 1724 08/23/22 0845   08/19/22 1030  cefTRIAXone (ROCEPHIN) 1 g in sodium chloride 0.9 % 100 mL IVPB  Status:  Discontinued        1 g 200 mL/hr over 30 Minutes Intravenous Every 24 hours 08/19/22 0937 08/22/22 1715        Assessment/Plan POD 12 s/p exploratory laparotomy, right hemicolectomy, ileostomy, mucus fistula, biopsy of pelvic mass by Dr. 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 Margins negative. 0/43 lymph nodes.  - CEA - 143, CA 125: 40 and CA 19-9: 50 - Has had CT CAP since admission - Discussed case with attending - given mass is cancerous, the concern is that this was not resectable during her initial operation. If this progresses and causes another obstruction I think her options would be limited surgically. Dr. Mosetta Putt of Oncology has seen and felt her cancer is likely incurable and her overall prognosis is poor. They will plan to see her back in 4-6 weeks to discuss options for chemo. Palliative is following. Will f/u on their note and recs.   - High  ileostomy output. Cont fiber, iron and immodium. Discussed with attending, will hold off on adding anything additional for now. Monitor output, Cr, electrolytes. Avoid sugary drinks.  - WOC for stoma teaching and assistance with pouching. - BID wet to dry to midline - CT 7/14 obtained for drain appearing feculent - this showed small volume fluid within the abdomen and pelvis with several collections demonstrating rim-enhancement suggestive of abscesses -  lower right pericolic gutter fluid collection measures 5.2 x 3.5 x 4.1 cm and collection along the anterior margin of the uterine fundus measures approximately 7.2 x 1.8 x 5.1 cm. Surgical drain is near both of these collections but does not necessarily course through them completely. IR consulted for drainage. They felt the intra-abdominal fluid collections are small and look like post-op blood. Cont abx for now. Will discuss duration of abx with MD. WBC up today but clinically stable. May need repeat CT scan over the weekend if fever, worsening leukocytosis, clinical change etc.  - Cont JP drain - no longer feculent  - Okay to advance enteral nutrition. Will adv to FLD. On Trickle TF's. Defer to primary if they continue TFs at trickle vs adv.    FEN: TF's per [facility]. Adv to FLD. IVF per [facility] VTE: SCDs, Heparin gtt. Would hold on changing to DOAC for now.  ID: Cefepime/flagyl/micafungin Foley: Removed, Monitor. Urology following. S/p R PCN Dispo - As above  Per [facility] New stroke - Seen on MRI 7/10, suspected thromboembolic, defer to primary team/stroke team for further w/u and management.  PE - heparin drip per primary.  HTN HLD GERD COPD R hydro - s/p R PCN CT 7/14 also suggestion of splenic infarct and bilateral pleural effusions    LOS: 16 days    Debra Sharp , [facility] Surgery 09/03/2022, 11:04 AM Please see Amion for pager number during day hours 7:00am-4:30pm

## 2022-09-03 NOTE — Consult Note (Signed)
WOC Nurse ostomy follow up Stoma type/location:  RLQ ileostomy RLQ mucous fistula Stomal assessment/size: ileostomy 1 1/4" round, os at center; active during pouch change MF; 1 3/4" round budded, os at center Peristomal assessment: intact Treatment options for stomal/peristomal skin: using strips of [place] between the two stomas to protect the intact skin  Output; liquid green Ostomy pouching: 1pc./2pc.  Education provided: NA; patient will be total care for ostomy; she is not able to use her left arm/hand at all Enrolled patient in [facility] Discharge program: No; DC to LTAC planned    WOC Nurse will follow along with you for continued support with ostomy teaching and care Shareta Fishbaugh [facility] MSN, RN, [place], CNS, [place] [phone]

## 2022-09-04 ENCOUNTER — Other Ambulatory Visit (HOSPITAL_COMMUNITY): Payer: Self-pay

## 2022-09-04 ENCOUNTER — Inpatient Hospital Stay (HOSPITAL_COMMUNITY): Payer: Medicare Other

## 2022-09-04 DIAGNOSIS — K922 Gastrointestinal hemorrhage, unspecified: Secondary | ICD-10-CM | POA: Diagnosis not present

## 2022-09-04 LAB — GLUCOSE, CAPILLARY
Glucose-Capillary: 102 mg/dL — ABNORMAL HIGH (ref 70–99)
Glucose-Capillary: 124 mg/dL — ABNORMAL HIGH (ref 70–99)
Glucose-Capillary: 156 mg/dL — ABNORMAL HIGH (ref 70–99)
Glucose-Capillary: 91 mg/dL (ref 70–99)
Glucose-Capillary: 96 mg/dL (ref 70–99)

## 2022-09-04 LAB — CBC
HCT: 27.9 % — ABNORMAL LOW (ref 36.0–46.0)
Hemoglobin: 8.5 g/dL — ABNORMAL LOW (ref 12.0–15.0)
MCH: 25.1 pg — ABNORMAL LOW (ref 26.0–34.0)
MCHC: 30.5 g/dL (ref 30.0–36.0)
MCV: 82.3 fL (ref 80.0–100.0)
Platelets: 1254 10*3/uL (ref 150–400)
RBC: 3.39 MIL/uL — ABNORMAL LOW (ref 3.87–5.11)
RDW: 26.8 % — ABNORMAL HIGH (ref 11.5–15.5)
WBC: 17.1 10*3/uL — ABNORMAL HIGH (ref 4.0–10.5)
nRBC: 0 % (ref 0.0–0.2)

## 2022-09-04 LAB — COMPREHENSIVE METABOLIC PANEL
ALT: 11 U/L (ref 0–44)
AST: 18 U/L (ref 15–41)
Albumin: 1.5 g/dL — ABNORMAL LOW (ref 3.5–5.0)
Alkaline Phosphatase: 71 U/L (ref 38–126)
Anion gap: 5 (ref 5–15)
BUN: 23 mg/dL (ref 8–23)
CO2: 17 mmol/L — ABNORMAL LOW (ref 22–32)
Calcium: 8 mg/dL — ABNORMAL LOW (ref 8.9–10.3)
Chloride: 111 mmol/L (ref 98–111)
Creatinine, Ser: 0.93 mg/dL (ref 0.44–1.00)
GFR, Estimated: >60 mL/min (ref >60)
Glucose, Bld: 100 mg/dL — ABNORMAL HIGH (ref 70–99)
Potassium: 3.6 mmol/L (ref 3.5–5.1)
Sodium: 133 mmol/L — ABNORMAL LOW (ref 135–145)
Total Bilirubin: 0.1 mg/dL — ABNORMAL LOW (ref 0.3–1.2)
Total Protein: 4.6 g/dL — ABNORMAL LOW (ref 6.5–8.1)

## 2022-09-04 LAB — VITAMIN B12: Vitamin B-12: 455 pg/mL (ref 180–914)

## 2022-09-04 LAB — FOLATE: Folate: 10.6 ng/mL (ref >5.9)

## 2022-09-04 LAB — PHOSPHORUS: Phosphorus: 4.2 mg/dL (ref 2.5–4.6)

## 2022-09-04 LAB — HEPARIN LEVEL (UNFRACTIONATED)
Heparin Unfractionated: 0.46 IU/mL (ref 0.30–0.70)
Heparin Unfractionated: 0.51 IU/mL (ref 0.30–0.70)

## 2022-09-04 LAB — MAGNESIUM: Magnesium: 1.7 mg/dL (ref 1.7–2.4)

## 2022-09-04 MED ORDER — DIPHENOXYLATE-ATROPINE 2.5-0.025 MG PO TABS
1.0000 | ORAL_TABLET | Freq: Two times a day (BID) | ORAL | Status: DC
  Administered 2022-09-04 – 2022-09-12 (×12): 1 via ORAL
  Filled 2022-09-04 (×17): qty 1

## 2022-09-04 MED ORDER — METHYLPREDNISOLONE SODIUM SUCC 125 MG IJ SOLR
125.0000 mg | Freq: Once | INTRAMUSCULAR | Status: AC
  Administered 2022-09-04: 125 mg via INTRAVENOUS

## 2022-09-04 MED ORDER — ATORVASTATIN CALCIUM 10 MG PO TABS
20.0000 mg | ORAL_TABLET | Freq: Every day | ORAL | Status: DC
  Administered 2022-09-04 – 2022-09-07 (×4): 20 mg via ORAL
  Filled 2022-09-04 (×7): qty 2

## 2022-09-04 MED ORDER — METRONIDAZOLE 500 MG PO TABS
500.0000 mg | ORAL_TABLET | Freq: Two times a day (BID) | ORAL | Status: DC
  Administered 2022-09-04 – 2022-09-12 (×14): 500 mg via ORAL
  Filled 2022-09-04 (×17): qty 1

## 2022-09-04 MED ORDER — ZINC SULFATE 220 (50 ZN) MG PO CAPS
220.0000 mg | ORAL_CAPSULE | Freq: Every day | ORAL | Status: DC
  Administered 2022-09-04 – 2022-09-09 (×5): 220 mg via ORAL
  Filled 2022-09-04 (×9): qty 1

## 2022-09-04 MED ORDER — ADULT MULTIVITAMIN W/MINERALS CH
1.0000 | ORAL_TABLET | Freq: Every day | ORAL | Status: DC
  Administered 2022-09-04 – 2022-09-08 (×5): 1 via ORAL
  Filled 2022-09-04 (×5): qty 1

## 2022-09-04 MED ORDER — K PHOS MONO-SOD PHOS DI & MONO 155-852-130 MG PO TABS
500.0000 mg | ORAL_TABLET | Freq: Three times a day (TID) | ORAL | Status: DC
  Administered 2022-09-04: 500 mg via ORAL
  Filled 2022-09-04: qty 2

## 2022-09-04 MED ORDER — OXYCODONE HCL 5 MG PO TABS
5.0000 mg | ORAL_TABLET | ORAL | Status: DC | PRN
  Administered 2022-09-09: 5 mg via ORAL
  Filled 2022-09-04: qty 1

## 2022-09-04 MED ORDER — VITAMIN C 500 MG PO TABS
500.0000 mg | ORAL_TABLET | Freq: Every day | ORAL | Status: DC
  Administered 2022-09-04 – 2022-09-10 (×6): 500 mg via ORAL
  Filled 2022-09-04 (×8): qty 1

## 2022-09-04 MED ORDER — DIPHENHYDRAMINE HCL 50 MG/ML IJ SOLN
50.0000 mg | Freq: Once | INTRAMUSCULAR | Status: AC
  Administered 2022-09-04: 50 mg via INTRAVENOUS

## 2022-09-04 MED ORDER — IOHEXOL 350 MG/ML SOLN
75.0000 mL | Freq: Once | INTRAVENOUS | Status: AC | PRN
  Administered 2022-09-04: 75 mL via INTRAVENOUS

## 2022-09-04 MED ORDER — PROSOURCE TF20 ENFIT COMPATIBL EN LIQD
60.0000 mL | Freq: Three times a day (TID) | ENTERAL | Status: DC
  Administered 2022-09-04 – 2022-09-05 (×4): 60 mL
  Filled 2022-09-04 (×5): qty 60

## 2022-09-04 MED ORDER — PROSOURCE PLUS PO LIQD
30.0000 mL | Freq: Two times a day (BID) | ORAL | Status: DC
  Administered 2022-09-04: 30 mL via ORAL

## 2022-09-04 MED ORDER — ENSURE ENLIVE PO LIQD
237.0000 mL | Freq: Three times a day (TID) | ORAL | Status: DC
  Administered 2022-09-04 – 2022-09-12 (×12): 237 mL via ORAL

## 2022-09-04 MED ORDER — ACETAMINOPHEN 325 MG PO TABS
650.0000 mg | ORAL_TABLET | Freq: Four times a day (QID) | ORAL | Status: DC
  Administered 2022-09-04 – 2022-09-13 (×17): 650 mg via ORAL
  Filled 2022-09-04 (×18): qty 2

## 2022-09-04 MED ORDER — MELATONIN 5 MG PO TABS
10.0000 mg | ORAL_TABLET | Freq: Every evening | ORAL | Status: DC | PRN
  Administered 2022-09-04: 10 mg via ORAL
  Filled 2022-09-04: qty 2

## 2022-09-04 MED ORDER — VITAL 1.5 CAL PO LIQD
1000.0000 mL | ORAL | Status: DC
  Administered 2022-09-04 – 2022-09-05 (×2): 1000 mL
  Filled 2022-09-04: qty 1000

## 2022-09-04 NOTE — Progress Notes (Signed)
Occupational Therapy Treatment Patient Details Name: Paetyn Pietrzak MRN: [id number] DOB: April 16, 1950 Today's Date: 09/04/2022   History of present illness 72 yo female admitted 7/2 with generalized weakness, thrombocytosis and anemia. 7/2 MRI scattered punctate ischemic infarcts right parietal, left frontal, right cerebellum. Pt with progression to AKI with septic peritonitis. 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 7/8 extubated. Lt sided weakness noted post extubation with CT demonstrating possible Rt parietal infarct. PMHx: COPD, anemia, CHF, HLD, HTN   OT comments  Patient with difficulty this session compared to last.  Patient had CT and was given Benadryl for slight allergic reaction.  Patient was sleepy, but still put forth effort.  OT to continue efforts in the acute setting, and Patient will benefit from continued inpatient follow up therapy, <3 hours/day    Recommendations for follow up therapy are one component of a multi-disciplinary discharge planning process, led by the attending physician.  Recommendations may be updated based on patient status, additional functional criteria and insurance authorization.    Assistance Recommended at Discharge Frequent or constant Supervision/Assistance  Patient can return home with the following  Two people to help with walking and/or transfers;Two people to help with bathing/dressing/bathroom;Assistance with cooking/housework;Direct supervision/assist for medications management;Direct supervision/assist for financial management;Assist for transportation;Help with stairs or ramp for entrance   Equipment Recommendations  Other (comment)    Recommendations for Other Services      Precautions / Restrictions Precautions Precautions: Fall;Other (comment) Precaution Comments: cortrak, ostomy, drain, L side weakness Restrictions Weight Bearing Restrictions: No        Mobility Bed Mobility Overal bed mobility: Needs Assistance Bed Mobility: Supine to Sit, Sit to Supine, Rolling Rolling: Total assist   Supine to sit: Max assist, +2 for physical assistance Sit to supine: Total assist, +2 for physical assistance     Patient Response: Cooperative  Transfers                   General transfer comment: unable     Balance Overall balance assessment: Needs assistance Sitting-balance support: Bilateral upper extremity supported, Feet supported, Single extremity supported, No upper extremity supported Sitting balance-Leahy Scale: Zero                                     ADL either performed or assessed with clinical judgement   ADL       Grooming: Oral care;Minimal assistance;Sitting;Wash/dry face Grooming Details (indicate cue type and reason): Total A for balance                                    Extremity/Trunk Assessment Upper Extremity Assessment RUE Deficits / Details: Able to use R UE to brush teeth and wash face, assist for thoroughness RUE Coordination: decreased gross motor;decreased fine motor LUE Deficits / Details: No AROM noted LUE Sensation: decreased light touch LUE Coordination: decreased fine motor;decreased gross motor   Lower Extremity Assessment Lower Extremity Assessment: Defer to PT evaluation        Vision   Vision Assessment?: Vision impaired- to be further tested in functional context   Perception Perception Perception: Not tested   Praxis Praxis Praxis: Not tested    Cognition Arousal/Alertness: Lethargic Behavior During Therapy: Flat affect Overall Cognitive Status: Impaired/Different from baseline  Current Attention Level: Focused         Problem Solving: Slow processing, Decreased initiation, Difficulty sequencing, Requires verbal cues, Requires tactile cues          Exercises      Shoulder Instructions        General Comments HR in 120's during mobility    Pertinent Vitals/ Pain       Pain Assessment Pain Assessment: Faces Faces Pain Scale: Hurts little more Pain Location: generalized with mobility Pain Descriptors / Indicators: Moaning, Sore Pain Intervention(s): Monitored during session                                                          Frequency  Min 2X/week        Progress Toward Goals  OT Goals(current goals can now be found in the care plan section)  Progress towards OT goals: Progressing toward goals  Acute Rehab OT Goals OT Goal Formulation: With patient Time For Goal Achievement: 09/09/22 Potential to Achieve Goals: Fair  Plan Discharge plan remains appropriate;Frequency remains appropriate    Co-evaluation    PT/OT/SLP Co-Evaluation/Treatment: Yes Reason for Co-Treatment: For patient/therapist safety;To address functional/ADL transfers PT goals addressed during session: Mobility/safety with mobility;Balance;Strengthening/ROM OT goals addressed during session: ADL's and self-care;Strengthening/ROM      AM-PAC OT "6 Clicks" Daily Activity     Outcome Measure   Help from another person eating meals?: Total Help from another person taking care of personal grooming?: A Lot Help from another person toileting, which includes using toliet, bedpan, or urinal?: Total Help from another person bathing (including washing, rinsing, drying)?: Total Help from another person to put on and taking off regular upper body clothing?: A Lot Help from another person to put on and taking off regular lower body clothing?: Total 6 Click Score: 8    End of Session Equipment Utilized During Treatment: Oxygen  OT Visit Diagnosis: Unsteadiness on feet (R26.81);Muscle weakness (generalized) (M62.81);Other symptoms and signs involving cognitive function   Activity Tolerance Patient limited by fatigue;Patient tolerated treatment well   Patient Left in  bed;with call bell/phone within reach;with bed alarm set   Nurse Communication Mobility status        Time: 1610-9604 OT Time Calculation (min): 23 min  Charges: OT General Charges $OT Visit: 1 Visit OT Treatments $Self Care/Home Management : 8-22 mins  09/04/2022  RP, OTR/L  Acute Rehabilitation Services  Office:  [phone]   Suzanna Obey 09/04/2022, 3:38 PM

## 2022-09-04 NOTE — Plan of Care (Signed)
  Problem: Activity: Goal: Risk for activity intolerance will decrease Outcome: Not Progressing   

## 2022-09-04 NOTE — Progress Notes (Signed)
Speech Language Pathology Treatment: Dysphagia  Patient Details Name: Debra Sharp MRN: [id number] DOB: [birth year] Today's Date: 09/04/2022 Time: 1601-0932 SLP Time Calculation (min) (ACUTE ONLY): 14 min  Assessment / Plan / Recommendation Clinical Impression  Pt's mentation is improved since this SLP's last visit, and she shows no overt signs of dysphagia with clear liquids (thin). No oral deficits noted that would suggest difficulty with solids once able to have them, but she does have some difficulty with head control and may benefit from set-up assistance and observation once able to start solids. Will leave on current diet but recommend advancing up to regular solids and thin liquids when medically cleared to do so. SLP will follow from a distance until solids are started.    HPI HPI: 72 yo female admitted 7/2 with generalized weakness, thrombocytosis and anemia. 7/2 MRI scattered punctate ischemic infarcts right parietal, left frontal, right cerebellum. Pt with progression to AKI with septic peritonitis. 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 7/8 extubated. Lt sided weakness noted post extubation with CT demonstrating possible Rt parietal infarct. PMHx: COPD, GERD, anemia, CHF, HLD, HTN      SLP Plan  Continue with current plan of care      Recommendations for follow up therapy are one component of a multi-disciplinary discharge planning process, led by the attending physician.  Recommendations may be updated based on patient status, additional functional criteria and insurance authorization.    Recommendations  Diet recommendations: Regular;Thin liquid (advancement per MD once medically ready) Liquids provided via: Cup;Straw Medication Administration: Whole meds with liquid Supervision: Staff to assist with self feeding Compensations: Slow rate;Small sips/bites Postural Changes and/or Swallow Maneuvers:  Seated upright 90 degrees;Upright 30-60 min after meal                  Oral care BID   Frequent or constant Supervision/Assistance Dysphagia, unspecified (R13.10)     Continue with current plan of care     Mahala Menghini., M.A. CCC-SLP Acute Rehabilitation Services Office [phone]  Secure chat preferred   09/04/2022, 11:53 AM

## 2022-09-04 NOTE — TOC Benefit Eligibility Note (Signed)
Pharmacy Patient Advocate Encounter  Insurance verification completed.    The patient is insured through  Limited Income Medicare Part D    Ran test claim for Eliquis 5 mg and the current 30 day co-pay is $4.60.   This test claim was processed through [facility]- copay amounts may vary at other pharmacies due to pharmacy/plan contracts, or as the patient moves through the different stages of their insurance plan.    Roland Earl, CPHT Pharmacy Patient Advocate Specialist [facility] Patient Advocate Team Direct Number: [phone]  Fax: [phone]

## 2022-09-04 NOTE — Progress Notes (Signed)
ANTICOAGULATION CONSULT NOTE - Follow-up Note  Pharmacy Consult for Heparin Indication:  pulmonary embolus with recent stroke on MRI on 7/10  Allergies  Allergen Reactions   Iodinated Contrast Media Shortness Of Breath and Itching    Resolved with benadryl and solu-mederol. Consider premedication prior to future studies.   Aspirin Other (See Comments)    Caused an asthma attack   Ibuprofen Swelling and Other (See Comments)    Angioedema and Asthma exacerbation   Sulfonamide Derivatives Swelling and Other (See Comments)    Face swelled SEVERELY   Penicillins Rash    Patient Measurements: Height: 5' 5.98" (167.6 cm) Weight:  (bed scale broken) IBW/kg (Calculated) : 59.26 Heparin Dosing Weight: 75 kg  Vital Signs: Temp: 97.8 F (36.6 C) (07/19 1200) Temp Source: Oral (07/19 1200) BP: 102/84 (07/19 1445) Pulse Rate: 119 (07/19 1445)  Labs: Recent Labs    09/02/22 0434 09/02/22 3419 09/03/22 0729 09/03/22 2026 09/04/22 0405 09/04/22 0929 09/04/22 1200  HGB 8.5*  --  8.8*  --  8.5*  --   --   HCT 28.6*  --  29.2*  --  27.9*  --   --   PLT 1,044*  --  1,147*  --  1,254*  --   --   HEPARINUNFRC 0.95*   < > 0.91* 0.14*  --  0.46 0.51  CREATININE 0.78  --  0.78  --  0.93  --   --    < > = values in this interval not displayed.    Estimated Creatinine Clearance: 59.4 mL/min (by C-G formula based on SCr of 0.93 mg/dL).   Medical History: Past Medical History:  Diagnosis Date   Anemia    Anxiety    Arthritis    Asthma    Blood transfusion without reported diagnosis    Cataract    removed bilateral   COPD (chronic obstructive pulmonary disease) (HCC)    Depression    GERD (gastroesophageal reflux disease)    Hyperlipidemia    Hypertension      Assessment: 72 yo W with a history of HTN, HLD, HF, thrombocytopenia, COPD, asthma found to have small subsegmental acute PE with acute and late acute CVA. No anticoagulation prior to admission. Pharmacy consulted for  heparin.    Patient with labile heparin levels. Heparin level drawn 10 hours after rate increase. Level 0.46 is therapeutic on 1800 units/hr.    Goal of Therapy:  Heparin level 0.3-0.5 units/ml Monitor platelets by anticoagulation protocol: Yes   Plan:   Continue heparin infusion to 1800 units/hr F/u 6hr confirmatory level  Monitor daily heparin level, CBC Monitor for signs/symptoms of bleeding   ADDENDUM 1500: Confirmatory level 0.51 is just barely above goal. Will decrease slightly to 1750 units/hr to keep in goal given small PE and acute CVA.    Alphia Moh, PharmD, BCPS, BCCP Clinical Pharmacist  Please check AMION for all [facility] Pharmacy phone numbers After 10:00 PM, call Main Pharmacy [phone]

## 2022-09-04 NOTE — Progress Notes (Signed)
Nutrition Follow-up  DOCUMENTATION CODES:   Non-severe (moderate) malnutrition in context of chronic illness  INTERVENTION:   Pt remains Full Code; Recommend initiation of TPN at this time given prolonged inadequate oral intake with inability to advance TF, presence of malnutrition, wounds, increased needs for post op healing, GI losses  PO diet as tolerated with advancement per MD  Ensure Enlive po TID, each supplement provides 350 kcal and 20 grams of protein.  Tube Feeding via Cortrak: discussed with Surgery today post CT scan, plan to continue trickle TF only for now Vital 1.5 at 10 ml/hr TF goal rate: Vital 1.5 at 55 ml/hr with Pro-Source TF20 60 mL daily TF at goal provides 109 g of protein, 2060 kcals and 1003 mL of free water  While on trickle TF, will add extra Pro-Source TF20 60 mL (increase to QID) mL maximize protein intake. Each packet provides 20 g of protein, 80 kcals   Agree with MVI with minerals; recommend Vitamin C 500 mg x 30 days and Zinc 220 mg x 14 days for wound healing   NUTRITION DIAGNOSIS:   Moderate Malnutrition related to chronic illness as evidenced by mild fat depletion, moderate muscle depletion.  Continues  GOAL:   Patient will meet greater than or equal to 90% of their needs  Not Met  MONITOR:   Diet advancement, Labs, Weight trends, TF tolerance, I & O's  REASON FOR ASSESSMENT:   Consult Enteral/tube feeding initiation and management  ASSESSMENT:   72 y.o. female presented to the ED with weakness, N/V, and abdominal pain. PMH includes COPD, HTN, HLD, GERD, and anxiety. Pt admitted with anemia 2/2 possible upper GI bleed, pelvic mass, and incidental stroke finding.  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   Vital 1.5 at 10 ml/hr via Cortrak. Dietitian received consult to advance TF but given repeat CT, plan to discuss with Surgery  Pt currently on FL diet and tolerating some po per pt and per RN. Pt reports she loves Ensure/Boost and will to try these.   Ostomy output with 1.7 L-pt receiving fiber, iron, imodium and MR added lomotil BID today JP drain with purulent drainage  Labs: sodium 133 (L), Creatinine wdl, CBGs 91-121 Meds: reviewed    Diet Order:   Diet Order             Diet full liquid Fluid consistency: Thin  Diet effective now                   EDUCATION NEEDS:   Not appropriate for education at this time  Skin:  Skin Assessment: Reviewed RN Assessment  Last BM:  7/15 - type 7  Height:   Ht Readings from Last 1 Encounters:  08/22/22 5' 5.98" (1.676 m)    Weight:   Wt Readings from Last 1 Encounters:  05/13/22 77.1 kg    Ideal Body Weight:  59.1 kg  BMI:  Body mass index is 27.8 kg/m.  Estimated Nutritional Needs:   Kcal:  1850-2050  Protein:  90-110 grams  Fluid:  >/= 1.8 L   [name] MS, RDN, LDN, CNSC Registered Dietitian 3 Clinical Nutrition RD Pager and On-Call Pager Number Located  in Amion

## 2022-09-04 NOTE — Progress Notes (Signed)
Patient ID: Debra Sharp, female   DOB: [birth year], 72 y.o.   MRN: [id number] [facility] Surgery Progress Note  13 Days Post-Op  Subjective: CC-  No complaints this morning. States that she feels better each day. Tolerating full liquids. TF at 10cc/hr. Denies any worsening abdominal pain, nausea, vomiting. Having ostomy output (up to 1700cc). WBC remains elevated at 17.1, afebrile.  Objective: Vital signs in last 24 hours: Temp:  [97.5 F (36.4 C)-98.7 F (37.1 C)] 98.1 F (36.7 C) (07/19 0829) Pulse Rate:  [77-105] 77 (07/19 0829) Resp:  [15-20] 16 (07/19 0829) BP: (134-158)/(54-67) 158/55 (07/19 0829) SpO2:  [98 %-100 %] 100 % (07/19 0829) Last BM Date : 09/02/22  Intake/Output from previous day: 07/18 0701 - 07/19 0700 In: 1549.2 [P.O.:120; I.V.:310.1; NG/GT:378.8; IV Piggyback:740.3] Out: 8469 [GEXBM:8413; Drains:40; Stool:1700] Intake/Output this shift: Total I/O In: -  Out: 700 [Urine:700]  PE: Gen: Alert, NAD, pleasant Abd: Soft, mild distension, NT. Stoma and mucus fistula viable - ostomy appliance with thin brown output in bag.  Midline wound overall clean with some fibrinous tissue at the base with granulation tissue otherwise. JP drain with thin purulent drainage  Lab Results:  Recent Labs    09/03/22 0729 09/04/22 0405  WBC 17.5* 17.1*  HGB 8.8* 8.5*  HCT 29.2* 27.9*  PLT 1,147* 1,254*   BMET Recent Labs    09/02/22 0434 09/03/22 0729  NA 134* 133*  K 3.8 3.6  CL 111 110  CO2 18* 15*  GLUCOSE 110* 110*  BUN 19 21  CREATININE 0.78 0.78  CALCIUM 7.7* 7.7*   PT/INR No results for input(s): "LABPROT", "INR" in the last 72 hours. CMP     Component Value Date/Time   NA 133 (L) 09/03/2022 0729   K 3.6 09/03/2022 0729   CL 110 09/03/2022 0729   CO2 15 (L) 09/03/2022 0729   GLUCOSE 110 (H) 09/03/2022 0729   BUN 21 09/03/2022 0729   CREATININE 0.78 09/03/2022 0729   CALCIUM 7.7 (L) 09/03/2022 0729   PROT 4.7 (L) 08/31/2022 0150   ALBUMIN  1.5 (L) 08/31/2022 0150   AST 17 08/31/2022 0150   ALT 12 08/31/2022 0150   ALKPHOS 91 08/31/2022 0150   BILITOT 0.5 08/31/2022 0150   GFRNONAA >60 09/03/2022 0729   GFRAA >60 05/11/2015 0231   Lipase  No results found for: "LIPASE"     Studies/Results: No results found.  Anti-infectives: Anti-infectives (From admission, onward)    Start     Dose/Rate Route Frequency Ordered Stop   09/04/22 2200  metroNIDAZOLE (FLAGYL) tablet 500 mg        500 mg Oral Every 12 hours 09/04/22 0741     08/30/22 0830  ceFEPIme (MAXIPIME) 2 g in sodium chloride 0.9 % 100 mL IVPB        2 g 200 mL/hr over 30 Minutes Intravenous Every 8 hours 08/30/22 0826     08/23/22 2100  ceFEPIme (MAXIPIME) 2 g in sodium chloride 0.9 % 100 mL IVPB  Status:  Discontinued        2 g 200 mL/hr over 30 Minutes Intravenous Every 12 hours 08/23/22 1357 08/30/22 0826   08/23/22 0400  micafungin (MYCAMINE) 100 mg in sodium chloride 0.9 % 100 mL IVPB        100 mg 105 mL/hr over 1 Hours Intravenous Daily 08/23/22 0242     08/22/22 1800  metroNIDAZOLE (FLAGYL) IVPB 500 mg  Status:  Discontinued  500 mg 100 mL/hr over 60 Minutes Intravenous Every 12 hours 08/22/22 1711 09/04/22 0741   08/22/22 1800  ceFEPIme (MAXIPIME) 2 g in sodium chloride 0.9 % 100 mL IVPB  Status:  Discontinued        2 g 200 mL/hr over 30 Minutes Intravenous Every 8 hours 08/22/22 1716 08/23/22 1357   08/22/22 1724  metroNIDAZOLE (FLAGYL) 500 MG/100ML IVPB       Note to Pharmacy: [name]: cabinet override      08/22/22 1724 08/23/22 0845   08/19/22 1030  cefTRIAXone (ROCEPHIN) 1 g in sodium chloride 0.9 % 100 mL IVPB  Status:  Discontinued        1 g 200 mL/hr over 30 Minutes Intravenous Every 24 hours 08/19/22 0937 08/22/22 1715        Assessment/Plan POD 13 s/p exploratory laparotomy, right hemicolectomy, ileostomy, mucus fistula, biopsy of pelvic mass by Dr. 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 Margins negative. 0/43 lymph nodes.  - CEA - 143, CA 125: 40 and CA 19-9: 50 - Has had CT CAP since admission - Discussed case with attending - given mass is cancerous, the concern is that this was not resectable during her initial operation. If this progresses and causes another obstruction I think her options would be limited surgically. Dr. Mosetta Putt of Oncology has seen and felt her cancer is likely incurable and her overall prognosis is poor. They will plan to see her back in 4-6 weeks to discuss options for chemo. Palliative is following. Will f/u on their note and recs.    - High ileostomy output. Cont fiber, iron and immodium. Add lomotil BID 7/19. Monitor output, Cr, electrolytes. Avoid sugary drinks.  - WOC for stoma teaching and assistance with pouching. - BID wet to dry to midline - CT 7/14 obtained for drain appearing feculent - this showed small volume fluid within the abdomen and pelvis with several collections demonstrating rim-enhancement suggestive of abscesses -  lower right pericolic gutter fluid collection measures 5.2 x 3.5 x 4.1 cm and collection along the anterior margin of the uterine fundus measures approximately 7.2 x 1.8 x 5.1 cm. Surgical drain is near both of these collections but does not necessarily course through them completely. IR consulted for drainage. They felt the intra-abdominal fluid collections are small and look like post-op blood. Cont abx for now. Will discuss duration of abx with MD. WBC up today but clinically stable. May need repeat CT scan over the weekend if fever, worsening leukocytosis, clinical change etc.  - Cont JP drain - no longer feculent but is purulent - Given persistent leukocytosis will obtain repeat CT scan with oral control today. Will await findings, but hopefully can advance TF/FLD.   FEN: TF's per [facility]. FLD. IVF per [facility] VTE: SCDs,  Heparin gtt. Would hold on changing to DOAC for now.  ID: Cefepime/flagyl/micafungin Foley: Removed, Monitor. Urology following. S/p R PCN Dispo - As above   Per [facility] New stroke - Seen on MRI 7/10, suspected thromboembolic, defer to primary team/stroke team for further w/u and management.  PE - heparin drip per primary.  HTN HLD GERD COPD R hydro - s/p R PCN CT 7/14 also suggestion of splenic infarct and bilateral pleural effusions    LOS: 17 days    Franne Forts, [facility] Surgery 09/04/2022, 10:44 AM Please see Amion for pager number during  day hours 7:00am-4:30pm

## 2022-09-04 NOTE — Progress Notes (Signed)
ANTICOAGULATION CONSULT NOTE - Follow-up Note  Pharmacy Consult for Heparin Indication:  pulmonary embolus with recent stroke on MRI on 7/10  Allergies  Allergen Reactions   Aspirin Other (See Comments)    Caused an asthma attack   Ibuprofen Swelling and Other (See Comments)    Angioedema and Asthma exacerbation   Sulfonamide Derivatives Swelling and Other (See Comments)    Face swelled SEVERELY   Penicillins Rash    Patient Measurements: Height: 5' 5.98" (167.6 cm) Weight:  (bed scale broken) IBW/kg (Calculated) : 59.26 Heparin Dosing Weight: 75 kg  Vital Signs: Temp: 98.1 F (36.7 C) (07/19 0829) Temp Source: Oral (07/19 0829) BP: 158/55 (07/19 0829) Pulse Rate: 77 (07/19 0829)  Labs: Recent Labs    09/02/22 0434 09/02/22 7846 09/03/22 0729 09/03/22 2026 09/04/22 0405 09/04/22 0929  HGB 8.5*  --  8.8*  --  8.5*  --   HCT 28.6*  --  29.2*  --  27.9*  --   PLT 1,044*  --  1,147*  --  1,254*  --   HEPARINUNFRC 0.95*   < > 0.91* 0.14*  --  0.46  CREATININE 0.78  --  0.78  --   --   --    < > = values in this interval not displayed.    Estimated Creatinine Clearance: 69 mL/min (by C-G formula based on SCr of 0.78 mg/dL).   Medical History: Past Medical History:  Diagnosis Date   Anemia    Anxiety    Arthritis    Asthma    Blood transfusion without reported diagnosis    Cataract    removed bilateral   COPD (chronic obstructive pulmonary disease) (HCC)    Depression    GERD (gastroesophageal reflux disease)    Hyperlipidemia    Hypertension      Assessment: 70 yof with a history of HTN, HLD, HF, thrombocytopenia, COPD, asthma found to have small subsegmental acute PE with acute and late acute CVA. No anticoagulation prior to admission. Pharmacy consulted for heparin.    Patient with labile heparin levels. Heparin level drawn 10 hours after rate increase. Level 0.46 is therapeutic on 1800 units/hr.    Goal of Therapy:  Heparin level 0.3-0.5  units/ml Monitor platelets by anticoagulation protocol: Yes   Plan:   Continue heparin infusion to 1800 units/hr F/u 6hr confirmatory level  Monitor daily heparin level, CBC Monitor for signs/symptoms of bleeding    Alphia Moh, PharmD, BCPS, BCCP Clinical Pharmacist  Please check AMION for all [facility] Pharmacy phone numbers After 10:00 PM, call Main Pharmacy [phone]

## 2022-09-04 NOTE — Progress Notes (Signed)
Called to CT scanner 3 for allergic reaction. Patient reporting generalized pruritus and SHOB. Per CT tech patient de- sated to 84%. Placed on 6L of [place]. Oxygenation improved to 99%. HR 129. Rapid response and ED pharmacy called. Airway intact. No visible signs of urticaria, no erythema. Patient able to speak in full sentences. Patient given 50 mg of benadryl IV and 125 mg solu- medrol Patient transported to floor by Rapid Response RN with verbal orders to give epi should the Patient decompensated en route.   Follow up with Charge RN on 4E and Rapid Response RN. - Patient remains on 6L of O2 via [place]. Patient lethargic however reporting positive response to medications with resolution of pruritits with no SHOB.   Contrast added to Patient's list of allergies. Since the Patient symptoms resolved with medication consideration should be given to pre-medication prior to any future contrast studies.

## 2022-09-04 NOTE — Significant Event (Signed)
Rapid Response Event Note   Reason for Call :  Following CT with contrast, pt had acute oxygen desaturation to 85% on room air, complained of difficulty breathing, and itching all over.   Initial Focused Assessment:  Pt lying in bed, alert. Lung sounds are clear. Breathing is regular, unlabored. Currently on 6LNC with SpO2 98-100%. Skin is warm, moist, pink. Pt continues to endorse itching all over.   VS: HR 109, RR 27, SpO2 99% on 6LNC  Interventions:  -125mg  IV Solu-Medrol and 50mg  IV Diphenhydramine   Following medication administration, pt endorses no longer feeling itchy and breathing feels better. SpO2 98% on 6LNC.   Plan of Care:  -Continue to monitor -Wean oxygen as pt tolerates  Call rapid response for additional needs  Event Summary:  MD Notified: Leeroy Bock, NP Call Time: [id number] Arrival Time: 1422 End Time: 1455  Jennye Moccasin, RN

## 2022-09-04 NOTE — Plan of Care (Signed)
  Problem: Health Behavior/Discharge Planning: Goal: Ability to manage health-related needs will improve Outcome: Progressing   Problem: Clinical Measurements: Goal: Ability to maintain clinical measurements within normal limits will improve Outcome: Progressing Goal: Will remain free from infection Outcome: Progressing Goal: Diagnostic test results will improve Outcome: Progressing Goal: Respiratory complications will improve Outcome: Progressing Goal: Cardiovascular complication will be avoided Outcome: Progressing   Problem: Activity: Goal: Risk for activity intolerance will decrease Outcome: Progressing   Problem: Nutrition: Goal: Adequate nutrition will be maintained Outcome: Progressing   Problem: Coping: Goal: Level of anxiety will decrease Outcome: Progressing   Problem: Elimination: Goal: Will not experience complications related to bowel motility Outcome: Progressing Goal: Will not experience complications related to urinary retention Outcome: Progressing   Problem: Pain Managment: Goal: General experience of comfort will improve Outcome: Progressing   Problem: Safety: Goal: Ability to remain free from injury will improve Outcome: Progressing   Problem: Skin Integrity: Goal: Risk for impaired skin integrity will decrease Outcome: Progressing   Problem: Education: Goal: Ability to describe self-care measures that may prevent or decrease complications (Diabetes Survival Skills Education) will improve Outcome: Progressing Goal: Individualized Educational Video(s) Outcome: Progressing   Problem: Coping: Goal: Ability to adjust to condition or change in health will improve Outcome: Progressing   Problem: Fluid Volume: Goal: Ability to maintain a balanced intake and output will improve Outcome: Progressing   Problem: Health Behavior/Discharge Planning: Goal: Ability to identify and utilize available resources and services will improve Outcome:  Progressing Goal: Ability to manage health-related needs will improve Outcome: Progressing   Problem: Metabolic: Goal: Ability to maintain appropriate glucose levels will improve Outcome: Progressing   Problem: Nutritional: Goal: Maintenance of adequate nutrition will improve Outcome: Progressing Goal: Progress toward achieving an optimal weight will improve Outcome: Progressing   Problem: Skin Integrity: Goal: Risk for impaired skin integrity will decrease Outcome: Progressing   Problem: Tissue Perfusion: Goal: Adequacy of tissue perfusion will improve Outcome: Progressing   Problem: Education: Goal: Knowledge of disease or condition will improve Outcome: Progressing Goal: Knowledge of secondary prevention will improve (MUST DOCUMENT ALL) Outcome: Progressing Goal: Knowledge of patient specific risk factors will improve Loraine Leriche N/A or DELETE if not current risk factor) Outcome: Progressing   Problem: Ischemic Stroke/TIA Tissue Perfusion: Goal: Complications of ischemic stroke/TIA will be minimized Outcome: Progressing   Problem: Coping: Goal: Will verbalize positive feelings about self Outcome: Progressing Goal: Will identify appropriate support needs Outcome: Progressing   Problem: Health Behavior/Discharge Planning: Goal: Ability to manage health-related needs will improve Outcome: Progressing Goal: Goals will be collaboratively established with patient/family Outcome: Progressing   Problem: Self-Care: Goal: Ability to participate in self-care as condition permits will improve Outcome: Progressing Goal: Verbalization of feelings and concerns over difficulty with self-care will improve Outcome: Progressing Goal: Ability to communicate needs accurately will improve Outcome: Progressing   Problem: Nutrition: Goal: Risk of aspiration will decrease Outcome: Progressing Goal: Dietary intake will improve Outcome: Progressing

## 2022-09-04 NOTE — Progress Notes (Signed)
Physical Therapy Treatment Patient Details Name: Debra Sharp MRN: [id number] DOB: Feb 18, 1950 Today's Date: 09/04/2022   History of Present Illness 72 yo female admitted 7/2 with generalized weakness, thrombocytosis and anemia. 7/2 MRI scattered punctate ischemic infarcts right parietal, left frontal, right cerebellum. Pt with progression to AKI with septic peritonitis. 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 7/8 extubated. Lt sided weakness noted post extubation with CT demonstrating possible Rt parietal infarct. PMHx: COPD, anemia, CHF, HLD, HTN    PT Comments  Pt received in supine and agreeable to session with encouragement. Pt recently back from CT and given benadryl per RN, likely contributing to increased fatigue this session. Pt requires max A +2- total A +2 for bed mobility this session. Pt able to tolerate sitting EOB for an extended time for hygiene tasks and RLE therex. Pt requires mod-max A to maintain sitting balance due to difficulty utilizing BUE for support and impaired core strength. Pt unable to hold upright posture once placed there and demonstrates a posterior and R lateral lean without support. Pt also demonstrates difficulty lifting her head throughout session. Pt reporting pain with attempts to assist increase BUE support. Pt unable to perform LLE LAQ this session. Pt continues to benefit from PT services to progress toward functional mobility goals.     Assistance Recommended at Discharge Frequent or constant Supervision/Assistance  If plan is discharge home, recommend the following:  Can travel by private vehicle    Two people to help with walking and/or transfers;Two people to help with bathing/dressing/bathroom;Assistance with cooking/housework;Assistance with feeding;Direct supervision/assist for medications management;Assist for transportation;Direct supervision/assist for financial  management   No  Equipment Recommendations  Wheelchair (measurements PT);Hospital bed;Wheelchair cushion (measurements PT);Other (comment)    Recommendations for Other Services       Precautions / Restrictions Precautions Precautions: Fall;Other (comment) Precaution Comments: cortrak, ostomy, drain, L side weakness Restrictions Weight Bearing Restrictions: No     Mobility  Bed Mobility Overal bed mobility: Needs Assistance Bed Mobility: Supine to Sit, Sit to Supine, Rolling Rolling: Total assist   Supine to sit: Max assist, +2 for physical assistance Sit to supine: Total assist, +2 for physical assistance   General bed mobility comments: Pt able to move RLE towards EOB, however requires max A +2 for all other aspects. Total A +2 with helicopter technique to return to supine due to increased fatigue.    Transfers                   General transfer comment: unable        Balance Overall balance assessment: Needs assistance Sitting-balance support: Bilateral upper extremity supported, Feet supported Sitting balance-Leahy Scale: Zero Sitting balance - Comments: Pt unable to maintain sitting balance without mod-max A. Pt with BUE on bed, but unable to use for support. Pt with posterior and R lateral lean.                                    Cognition Arousal/Alertness: Awake/alert Behavior During Therapy: Flat affect Overall Cognitive Status: Impaired/Different from baseline                                 General Comments: Pt with eyes closed throughout session, but responding to all cues. L side inattention and difficulty holding head  up througout session.        Exercises General Exercises - Lower Extremity Long Arc Quad: AROM, Seated, Right, 5 reps    General Comments General comments (skin integrity, edema, etc.): HR in 120's during mobility      Pertinent Vitals/Pain Pain Assessment Pain Assessment: Faces Faces Pain  Scale: Hurts even more Pain Location: generalized with mobility Pain Descriptors / Indicators: Moaning, Sore Pain Intervention(s): Monitored during session, Limited activity within patient's tolerance, Repositioned     PT Goals (current goals can now be found in the care plan section) Acute Rehab PT Goals PT Goal Formulation: Patient unable to participate in goal setting Time For Goal Achievement: 09/08/22 Potential to Achieve Goals: Fair Progress towards PT goals: Progressing toward goals (slowly)    Frequency    Min 2X/week      PT Plan Current plan remains appropriate    Co-evaluation PT/OT/SLP Co-Evaluation/Treatment: Yes Reason for Co-Treatment: For patient/therapist safety;To address functional/ADL transfers PT goals addressed during session: Mobility/safety with mobility;Balance;Strengthening/ROM        AM-PAC PT "6 Clicks" Mobility   Outcome Measure  Help needed turning from your back to your side while in a flat bed without using bedrails?: Total Help needed moving from lying on your back to sitting on the side of a flat bed without using bedrails?: Total Help needed moving to and from a bed to a chair (including a wheelchair)?: Total Help needed standing up from a chair using your arms (e.g., wheelchair or bedside chair)?: Total Help needed to walk in hospital room?: Total Help needed climbing 3-5 steps with a railing? : Total 6 Click Score: 6    End of Session   Activity Tolerance: Patient limited by fatigue;Patient limited by pain Patient left: in bed;with call bell/phone within reach Nurse Communication: Mobility status PT Visit Diagnosis: Other abnormalities of gait and mobility (R26.89);Muscle weakness (generalized) (M62.81);Other symptoms and signs involving the nervous system (R29.898);Hemiplegia and hemiparesis Hemiplegia - Right/Left: Left Hemiplegia - dominant/non-dominant: Non-dominant Hemiplegia - caused by: Cerebral infarction     Time:  1450-1524 PT Time Calculation (min) (ACUTE ONLY): 34 min  Charges:    $Therapeutic Activity: 8-22 mins PT General Charges $$ ACUTE PT VISIT: 1 Visit                     Johny Shock, PTA Acute Rehabilitation Services Secure Chat Preferred  [phone]    Johny Shock 09/04/2022, 3:33 PM

## 2022-09-04 NOTE — Progress Notes (Signed)
PROGRESS NOTE    Debra Sharp  UJW:[id number] DOB: April 25, 1950 DOA: 08/18/2022 PCP: Clayborn Heron, MD   Brief Narrative:  This 72 year old female with past medical history of hypertension, hyperlipidemia, systolic CHF, thrombocytopenia, COPD, asthma who initially presented with weakness, abdominal pain. Workup showed anemia concerning for upper GI bleed. CT abdomen/pelvis showed colonic distention, possible obstruction. GI consulted, plan was for EGD but she could not tolerate prep. Also found to have right-sided hydronephrosis, incidental acute stroke on CT/MRI. Repeat CT abdomen/pelvis on 7/6 showed new intraperitoneal free air with presumed perforation of the colon. Surgery consulted and she underwent emergent exploratory laparotomy, right hemicolectomy, ileostomy with fistula, biopsy of pelvic mass. Patient was left intubated and was transferred to ICU for pain management. 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 Palliative care consulted. General surgery following. Currently on tube feeding. Hospital course also remarkable for finding of acute PE now on heparin drip. Now extubated and transferred to [facility] service on 7/12. Plan was for TEE which has been canceled because patient declined. Palliative care, urology ,surgery ,neurology were following.     Currently titrating tube feeds.  Surgery following and considering repeat imaging  Assessment & Plan:   Principal Problem:   GI bleed Active Problems:   Symptomatic anemia   Thrombocytosis   Iron deficiency anemia   Abnormal CT of the abdomen   Colon distention   History of cardioembolic cerebrovascular accident (CVA)   Silent cerebral infarction (HCC)   Bowel perforation (HCC)   Ventilator dependent (HCC)   Pelvic mass   Change in bowel habits   Abnormal CT scan, gastrointestinal tract   Arterial hypotension   Septic shock (HCC)   Fecal occult blood test positive   Uterine leiomyoma   Hydroureteronephrosis    Sepsis with acute hypoxic respiratory failure and septic shock (HCC)   Encephalopathy acute   Perforation bowel (HCC)   Malnutrition of moderate degree   Cerebrovascular accident (CVA) due to embolism of precerebral artery (HCC)   Acute metabolic encephalopathy   Delirium   Adenocarcinoma of small bowel (HCC)  Septic shock secondary to acute peritonitis: Colonic perforation due to obstructing pelvic mass:  Status post exploratory laparotomy with right hemicolectomy, ileostomy creation, mucus fistual formation, biopsy of pelvic mass, and intraoperative of vasculature using indocyanine green.   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   Currently on broad spectrum anti-infectives with micafungin, Flagyl, cefepime.  culture NGTD.   On tube feeds, will continue for now until she's able to maintain adequate PO intake by mouth. RD c/s for tube feeds.  General surgery following, appreciate recs.   IR had been consulted for drainage of intraabdominal fluid collections, but they thought intraabdominal fluid collections are small and appeared c/w post op blood Repeat CT scan being considered by surgery if worsening fever, leukocytosis, etc Leukocytosis persist.  CT 7/13 with small volume fluid within abdomen and pelvis with several collections demonstrating rim enhancement suggestive of abscesses, new, near complete hypoattenuation of splenic parenchyma with subcapsular low density fluid along peripheral margin of spleen, multiple mildly dilated smal bowel loops with numerous air fluid levels (post op ileus), severe R hydroureteronephrosis, new mild left hydronephrosis.  Small bilateral effusions.  Large complex mass occupying majority of low right pelvis.  General surgery recommended Repeat CT A/P due to persistent leucocytosis.   Newly Diagnosed Terminal Ileum Adenocarcinoma with Peritoneal Metastasis:  Culprit for colonic perforation.  Initially concern for gynecological  malignancy with elevated CA125, CA 19-19 but  pathology showed small bowel primary.  Appreciate Dr. Latanya Maudlin recommendations.  Possible chemo after she recovers in 1-2 months.  ? Gyn follow up for debulking surgery of pelvic mass.  Overall prognosis is poor.  Recommending indefinite anticoagulation.   Chronic systolic CHF:  Echo on 08/18/2020 showed EF of 40 to 45%, global hypokinesis.  Previous echo on March 2024 showed EF of 30 to 35%.  We recommend follow-up with cardiology as an outpatient. She has bilateral LE edema - follow BNP Trial low dose lasix and follow.   Acute metabolic encephalopathy:  > Resolved. Likely from sepsis, delirium, CVAs.  This morning she remains alert oriented.  Confusion is intermittent.  -repeat CT brain neg for acute findings She is much alert, following commands.    Acute CVA: MRI 7/4 showed single punctate enhancement in the R frontal parietal white matter (if concern for metastatic disease, follow up in 12 weeks) - MRI done on 7/10 with acute and late acute infarcts in multiple vascular territories consistent with central embolic source.   Appreciate neurology recs -> bilateral anterior/posterior infarcts with pattern more concerning for hypotension in setting of severe anemia and septic shock with hypotension (ddx includes hypercoagulable state with suspected malignancy).  Recommending transition to PO anticoagulation per primary team.  Atorvastatin 20 mg.  EEG done on 7/9 did not show any seizure.  There was plan for TEE but patient has declined. .  Recommended SNF on discharge She has left-sided weakness. Hardly  able to get up from the bed.  Currently on heparin drip, will change to Eliquis  when she does not need any more surgery and is able to tolerate po intake   Acute PE: CT angiogram showed acute PE.  Currently on heparin drip.  No evidence of right heart strain.  Will start her on Eliquis when surgery clears   AKI/hyperkalemia/hypophosphatemia:   Currently  kidney function are  stable. Phosphorus being supplemented   Hydronephrosis secondary to abdominal mass: Urology following.  Follow-up CT showed persistent severe right hydroureteronephrosis.  IR did  PCNL placement on 7/15 (pulled overnight 7/15) -urology saw pt 7/16--and still flushes easily -mild left hydro will need to be followed outpatient.   Acute on chronic anemia of renal disease /acute blood loss anemia secondary to surgery/chronic GI bleed due to small bowel adenocarcinoma:  Monitor hemoglobin.  Transfuse if less than 7.  Currently on PPI. Relatively stable.    Hypertension: Off hypertensives.  Monitor blood pressure   Hyperlipidemia: Currently on Lipitor   Hyperglycemia: A1c 5.4, d/c SSI Currently on sliding scale insulin.     Left upper extremity edema:  Negative for DVT   Goals of care: Multiple comorbidities.  Palliative care were following.  She's currently full code/full scope of care.   DVT prophylaxis: Heparin gtt Code Status: Full code Family Communication:No family at bed side. Disposition Plan:   Status is: Inpatient Remains inpatient appropriate because:  Need for continued inpatient care.    Consultants:  Surgery Urology palliative  Procedures:  Echo EEG LUE duplex  Antimicrobials:  Anti-infectives (From admission, onward)    Start     Dose/Rate Route Frequency Ordered Stop   09/04/22 2200  metroNIDAZOLE (FLAGYL) tablet 500 mg        500 mg Oral Every 12 hours 09/04/22 0741     08/30/22 0830  ceFEPIme (MAXIPIME) 2 g in sodium chloride 0.9 % 100 mL IVPB        2 g 200 mL/hr over 30 Minutes Intravenous  Every 8 hours 08/30/22 0826     08/23/22 2100  ceFEPIme (MAXIPIME) 2 g in sodium chloride 0.9 % 100 mL IVPB  Status:  Discontinued        2 g 200 mL/hr over 30 Minutes Intravenous Every 12 hours 08/23/22 1357 08/30/22 0826   08/23/22 0400  micafungin (MYCAMINE) 100 mg in sodium chloride 0.9 % 100 mL IVPB        100 mg 105 mL/hr over 1 Hours  Intravenous Daily 08/23/22 0242     08/22/22 1800  metroNIDAZOLE (FLAGYL) IVPB 500 mg  Status:  Discontinued        500 mg 100 mL/hr over 60 Minutes Intravenous Every 12 hours 08/22/22 1711 09/04/22 0741   08/22/22 1800  ceFEPIme (MAXIPIME) 2 g in sodium chloride 0.9 % 100 mL IVPB  Status:  Discontinued        2 g 200 mL/hr over 30 Minutes Intravenous Every 8 hours 08/22/22 1716 08/23/22 1357   08/22/22 1724  metroNIDAZOLE (FLAGYL) 500 MG/100ML IVPB       Note to Pharmacy: [name]: cabinet override      08/22/22 1724 08/23/22 0845   08/19/22 1030  cefTRIAXone (ROCEPHIN) 1 g in sodium chloride 0.9 % 100 mL IVPB  Status:  Discontinued        1 g 200 mL/hr over 30 Minutes Intravenous Every 24 hours 08/19/22 0937 08/22/22 1715       Subjective: Patient was seen and examined at bed side, overnight events noted, She reports pain is reasonably controlled.  She has colostomy with brownish stool noted.   Objective: Vitals:   09/03/22 2345 09/04/22 0500 09/04/22 0829 09/04/22 1200  BP: (!) 146/54 (!) 137/56 (!) 158/55 (!) 157/69  Pulse: 96 85 77 92  Resp: 20 15 16 17   Temp: 98.1 F (36.7 C) 98 F (36.7 C) 98.1 F (36.7 C) 97.8 F (36.6 C)  TempSrc: Oral Oral Oral Oral  SpO2: 99% 100% 100% 100%  Weight:      Height:        Intake/Output Summary (Last 24 hours) at 09/04/2022 1305 Last data filed at 09/04/2022 1000 Gross per 24 hour  Intake 2215.87 ml  Output 3485 ml  Net -1269.13 ml   Filed Weights   09/01/22 0400 09/02/22 0432  Weight: 77.2 kg 78.1 kg    Examination:  General exam: Appears calm and comfortable, deconditioned, NAD  Respiratory system: Clear to auscultation. Respiratory effort normal. RR 15 Cardiovascular system: S1 & S2 heard, RRR. No JVD, murmurs, rubs, gallops or clicks. No pedal edema. Gastrointestinal system: Abdomen is non distended, soft and mildly tender. Colostomy noted with brown colored stool in bag. Normal bowel sounds heard. Central  nervous system: Alert and oriented. No focal neurological deficits. Extremities: Symmetric 5 x 5 power. Skin: No rashes, lesions or ulcers Psychiatry: Judgement and insight appear normal. Mood & affect appropriate.     Data Reviewed: I have personally reviewed following labs and imaging studies  CBC: Recent Labs  Lab 08/31/22 0150 09/01/22 0319 09/02/22 0434 09/03/22 0729 09/04/22 0405  WBC 20.3* 18.6* 16.2* 17.5* 17.1*  HGB 9.4* 9.7* 8.5* 8.8* 8.5*  HCT 31.3* 32.2* 28.6* 29.2* 27.9*  MCV 83.0 81.5 81.5 82.0 82.3  PLT 655* 932* 1,044* 1,147* 1,254*   Basic Metabolic Panel: Recent Labs  Lab 08/29/22 0436 08/29/22 0436 08/30/22 0123 08/31/22 0150 09/01/22 0319 09/02/22 0434 09/03/22 0729 09/04/22 0405  NA  --    < > 137 133*  136 134* 133* 133*  K  --    < > 4.0 4.1 4.0 3.8 3.6 3.6  CL  --    < > 106 108 111 111 110 111  CO2  --    < > 21* 17* 17* 18* 15* 17*  GLUCOSE  --    < > 127* 130* 92 110* 110* 100*  BUN  --    < > 30* 24* 20 19 21 23   CREATININE  --    < > 0.79 0.71 0.66 0.78 0.78 0.93  CALCIUM  --    < > 7.5* 7.5* 8.0* 7.7* 7.7* 8.0*  MG 1.7  --  1.8  --   --   --  1.8 1.7  PHOS 2.6  --  2.4* 2.8 3.8  --  4.1 4.2   < > = values in this interval not displayed.   GFR: Estimated Creatinine Clearance: 59.4 mL/min (by C-G formula based on SCr of 0.93 mg/dL). Liver Function Tests: Recent Labs  Lab 08/31/22 0150 09/04/22 0405  AST 17 18  ALT 12 11  ALKPHOS 91 71  BILITOT 0.5 0.1*  PROT 4.7* 4.6*  ALBUMIN 1.5* 1.5*   No results for input(s): "LIPASE", "AMYLASE" in the last 168 hours. No results for input(s): "AMMONIA" in the last 168 hours. Coagulation Profile: No results for input(s): "INR", "PROTIME" in the last 168 hours. Cardiac Enzymes: No results for input(s): "CKTOTAL", "CKMB", "CKMBINDEX", "TROPONINI" in the last 168 hours. BNP (last 3 results) No results for input(s): "PROBNP" in the last 8760 hours. HbA1C: No results for input(s): "HGBA1C"  in the last 72 hours. CBG: Recent Labs  Lab 09/03/22 2026 09/04/22 0009 09/04/22 0426 09/04/22 0829 09/04/22 1209  GLUCAP 97 124* 102* 96 91   Lipid Profile: No results for input(s): "CHOL", "HDL", "LDLCALC", "TRIG", "CHOLHDL", "LDLDIRECT" in the last 72 hours. Thyroid Function Tests: No results for input(s): "TSH", "T4TOTAL", "FREET4", "T3FREE", "THYROIDAB" in the last 72 hours. Anemia Panel: Recent Labs    09/03/22 0729 09/04/22 0405  VITAMINB12  --  455  FOLATE  --  10.6  FERRITIN 281  --   TIBC 197*  --   IRON 15*  --    Sepsis Labs: No results for input(s): "PROCALCITON", "LATICACIDVEN" in the last 168 hours.  Recent Results (from the past 240 hour(s))  Body fluid culture w Gram Stain     Status: None   Collection Time: 08/31/22  5:03 PM   Specimen: Urine, Random; Body Fluid  Result Value Ref Range Status   Specimen Description URINE, RANDOM  Final   Special Requests Normal  Final   Gram Stain   Final    WBC PRESENT, PREDOMINANTLY MONONUCLEAR NO ORGANISMS SEEN CYTOSPIN SMEAR    Culture   Final    NO GROWTH 3 DAYS Performed at [facility], [address] [place], [place] [address]    Report Status 09/03/2022 FINAL  Final         Radiology Studies: No results found.      Scheduled Meds:  (feeding supplement) PROSource Plus  30 mL Oral BID BM   acetaminophen  650 mg Oral Q6H   arformoterol  15 mcg Nebulization BID   atorvastatin  20 mg Oral Daily   budesonide (PULMICORT) nebulizer solution  0.5 mg Nebulization BID   Chlorhexidine Gluconate Cloth  6 each Topical Daily   diphenoxylate-atropine  1 tablet Oral BID   feeding supplement  237 mL Oral TID  BM   ferrous sulfate  325 mg Oral BID WC   loperamide  2 mg Oral BID   melatonin  3 mg Oral QHS   metroNIDAZOLE  500 mg Oral Q12H   multivitamin with minerals  1 tablet Oral Daily   pantoprazole (PROTONIX) IV  40 mg Intravenous Q12H   polycarbophil  625 mg Per Tube BID   revefenacin  175  mcg Nebulization Daily   sodium chloride flush  10-40 mL Intracatheter Q12H   tamsulosin  0.4 mg Oral Daily   Continuous Infusions:  sodium chloride Stopped (08/29/22 1855)   ceFEPime (MAXIPIME) IV 2 g (09/04/22 1118)   feeding supplement (VITAL 1.5 CAL) 10 mL/hr at 09/04/22 1000   heparin 1,800 Units/hr (09/04/22 1000)   micafungin (MYCAMINE) 100 mg in sodium chloride 0.9 % 100 mL IVPB 105 mL/hr at 09/04/22 1000   promethazine (PHENERGAN) injection (IM or IVPB)       LOS: 17 days    Time spent: 50 mins    Willeen Niece, MD [facility]   If 7PM-7AM, please contact night-coverage

## 2022-09-05 ENCOUNTER — Inpatient Hospital Stay (HOSPITAL_COMMUNITY): Payer: Medicare Other

## 2022-09-05 ENCOUNTER — Encounter (HOSPITAL_COMMUNITY): Payer: Self-pay | Admitting: Internal Medicine

## 2022-09-05 DIAGNOSIS — D62 Acute posthemorrhagic anemia: Secondary | ICD-10-CM | POA: Diagnosis not present

## 2022-09-05 DIAGNOSIS — J9601 Acute respiratory failure with hypoxia: Secondary | ICD-10-CM | POA: Diagnosis not present

## 2022-09-05 DIAGNOSIS — G9341 Metabolic encephalopathy: Secondary | ICD-10-CM | POA: Diagnosis not present

## 2022-09-05 DIAGNOSIS — K922 Gastrointestinal hemorrhage, unspecified: Secondary | ICD-10-CM | POA: Diagnosis not present

## 2022-09-05 DIAGNOSIS — C801 Malignant (primary) neoplasm, unspecified: Secondary | ICD-10-CM | POA: Diagnosis not present

## 2022-09-05 LAB — CBC
HCT: 27.4 % — ABNORMAL LOW (ref 36.0–46.0)
Hemoglobin: 8.2 g/dL — ABNORMAL LOW (ref 12.0–15.0)
MCH: 24.4 pg — ABNORMAL LOW (ref 26.0–34.0)
MCHC: 29.9 g/dL — ABNORMAL LOW (ref 30.0–36.0)
MCV: 81.5 fL (ref 80.0–100.0)
Platelets: 1279 10*3/uL (ref 150–400)
RBC: 3.36 MIL/uL — ABNORMAL LOW (ref 3.87–5.11)
RDW: 26.6 % — ABNORMAL HIGH (ref 11.5–15.5)
WBC: 20.3 10*3/uL — ABNORMAL HIGH (ref 4.0–10.5)
nRBC: 0 % (ref 0.0–0.2)

## 2022-09-05 LAB — HEPARIN LEVEL (UNFRACTIONATED)
Heparin Unfractionated: 0.33 IU/mL (ref 0.30–0.70)
Heparin Unfractionated: 0.37 IU/mL (ref 0.30–0.70)

## 2022-09-05 LAB — PROTIME-INR
INR: 1.2 (ref 0.8–1.2)
Prothrombin Time: 15.1 seconds (ref 11.4–15.2)

## 2022-09-05 LAB — GLUCOSE, CAPILLARY
Glucose-Capillary: 103 mg/dL — ABNORMAL HIGH (ref 70–99)
Glucose-Capillary: 111 mg/dL — ABNORMAL HIGH (ref 70–99)
Glucose-Capillary: 137 mg/dL — ABNORMAL HIGH (ref 70–99)
Glucose-Capillary: 139 mg/dL — ABNORMAL HIGH (ref 70–99)
Glucose-Capillary: 170 mg/dL — ABNORMAL HIGH (ref 70–99)
Glucose-Capillary: 92 mg/dL (ref 70–99)

## 2022-09-05 LAB — AEROBIC/ANAEROBIC CULTURE W GRAM STAIN (SURGICAL/DEEP WOUND)

## 2022-09-05 MED ORDER — LIDOCAINE HCL (PF) 1 % IJ SOLN
10.0000 mL | Freq: Once | INTRAMUSCULAR | Status: AC
  Administered 2022-09-05: 10 mL
  Filled 2022-09-05: qty 10

## 2022-09-05 MED ORDER — MIDAZOLAM HCL 2 MG/2ML IJ SOLN
INTRAMUSCULAR | Status: AC
  Filled 2022-09-05: qty 2

## 2022-09-05 MED ORDER — SODIUM CHLORIDE 0.9% FLUSH
5.0000 mL | Freq: Three times a day (TID) | INTRAVENOUS | Status: DC
  Administered 2022-09-05 – 2022-09-16 (×31): 5 mL

## 2022-09-05 MED ORDER — NALOXONE HCL 0.4 MG/ML IJ SOLN
INTRAMUSCULAR | Status: AC
  Filled 2022-09-05: qty 1

## 2022-09-05 MED ORDER — MIDAZOLAM HCL 2 MG/2ML IJ SOLN
INTRAMUSCULAR | Status: AC | PRN
  Administered 2022-09-05: 1 mg via INTRAVENOUS

## 2022-09-05 MED ORDER — FENTANYL CITRATE (PF) 100 MCG/2ML IJ SOLN
INTRAMUSCULAR | Status: AC | PRN
  Administered 2022-09-05: 25 ug via INTRAVENOUS

## 2022-09-05 MED ORDER — HEPARIN (PORCINE) 25000 UT/250ML-% IV SOLN: 1750.0000 [IU]/h | INTRAVENOUS | Status: DC

## 2022-09-05 MED ORDER — HEPARIN (PORCINE) 25000 UT/250ML-% IV SOLN
1650.0000 [IU]/h | INTRAVENOUS | Status: DC
  Administered 2022-09-05 – 2022-09-07 (×4): 1750 [IU]/h via INTRAVENOUS
  Filled 2022-09-05 (×3): qty 250

## 2022-09-05 MED ORDER — FENTANYL CITRATE (PF) 100 MCG/2ML IJ SOLN
INTRAMUSCULAR | Status: AC
  Filled 2022-09-05: qty 2

## 2022-09-05 MED ORDER — NALOXONE HCL 2 MG/2ML IJ SOSY
PREFILLED_SYRINGE | INTRAMUSCULAR | Status: AC
  Filled 2022-09-05: qty 2

## 2022-09-05 NOTE — Progress Notes (Signed)
14 Days Post-Op   Subjective/Chief Complaint: Pt doing well Tol FLD    Objective: Vital signs in last 24 hours: Temp:  [97.8 F (36.6 C)-98 F (36.7 C)] 98 F (36.7 C) (07/20 0436) Pulse Rate:  [92-175] 99 (07/20 0436) Resp:  [13-27] 20 (07/20 0436) BP: (102-157)/(56-84) 127/56 (07/20 0436) SpO2:  [94 %-100 %] 98 % (07/20 0837) Last BM Date : 09/04/22  Intake/Output from previous day: 07/19 0701 - 07/20 0700 In: 1459.2 [P.O.:240; I.V.:320.6; NG/GT:182; IV Piggyback:716.7] Out: 3070 [Urine:1170; Drains:50; Stool:1850] Intake/Output this shift: No intake/output data recorded.  PE: Gen: Alert, NAD, pleasant Abd: Soft, mild distension, NT. Stoma and mucus fistula viable - ostomy appliance with thin brown output in bag.  Midline wound overall clean with some fibrinous tissue at the base with granulation tissue otherwise. JP drain with purulent drainage  Lab Results:  Recent Labs    09/04/22 0405 09/05/22 0037  WBC 17.1* 20.3*  HGB 8.5* 8.2*  HCT 27.9* 27.4*  PLT 1,254* 1,279*   BMET Recent Labs    09/03/22 0729 09/04/22 0405  NA 133* 133*  K 3.6 3.6  CL 110 111  CO2 15* 17*  GLUCOSE 110* 100*  BUN 21 23  CREATININE 0.78 0.93  CALCIUM 7.7* 8.0*   PT/INR Recent Labs    09/05/22 0037  LABPROT 15.1  INR 1.2   ABG No results for input(s): "PHART", "HCO3" in the last 72 hours.  Invalid input(s): "PCO2", "PO2"  Studies/Results: CT ABDOMEN PELVIS W CONTRAST  Result Date: 09/04/2022 CLINICAL DATA:  Postoperative abdominal pain. Right hemicolectomy and ileostomy 13 days ago. EXAM: CT ABDOMEN AND PELVIS WITH CONTRAST TECHNIQUE: Multidetector CT imaging of the abdomen and pelvis was performed using the standard protocol following bolus administration of intravenous contrast. RADIATION DOSE REDUCTION: This exam was performed according to the departmental dose-optimization program which includes automated exposure control, adjustment of the mA and/or kV according to  patient size and/or use of iterative reconstruction technique. CONTRAST:  75mL OMNIPAQUE IOHEXOL 350 MG/ML SOLN COMPARISON:  CT abdomen and pelvis 08/30/2022 FINDINGS: Lower chest: There are small bilateral pleural effusions and bibasilar atelectasis similar to the prior examination. Hepatobiliary: No focal liver abnormality is seen. No gallstones, gallbladder wall thickening, or biliary dilatation. Pancreas: Unremarkable. No pancreatic ductal dilatation or surrounding inflammatory changes. Spleen: The spleen appears enlarged. In his nearly completely replaced with low-attenuation with some peripheral capsular enhancement. Appearance is similar to the prior study. There is no surrounding inflammatory stranding. Adrenals/Urinary Tract: The bilateral adrenal glands and left kidney are within normal limits. There is a new right-sided percutaneous nephrostomy with distal catheter tip in the right renal pelvis. There is severe right-sided hydronephrosis with some layering hyperdensity in the calices. Hydronephrosis has minimally decreased when compared to the prior study. The right ureter remains dilated to the level of the pelvic inlet, mildly decreased from prior. The bladder appears within normal limits. Stomach/Bowel: Right-sided ostomy is again noted. Oral contrast reaches mid small bowel. Mid and proximal small bowel loops are dilated measuring up to 4 cm and contain air-fluid levels. The tip of an enteric tube is in the gastric antrum. There are nondilated small bowel loops in the right lower quadrant, although definitive transition point is not visualized. Vascular/Lymphatic: Aortic atherosclerosis. Thrombus in the splenic vein appears unchanged on image 3/26. No enlarged abdominal or pelvic lymph nodes. Reproductive: Uterus is enlarged containing calcified fibroids. The ovaries are not well delineated. Other: Again seen is a percutaneous drainage catheter entering the  anterior left lower quadrant abdomen. The  catheter traverses the pelvis with distal tip in the right lower quadrant. There is an ill-defined septated enhancing low-density collection in the pelvis abutting the uterus and sigmoid colon. Small foci of air are seen within the collection similar to the prior study. This area measures proximally 9.5 x 6.3 by 10.8 cm and appears similar to the prior examination. Enhancing fluid collection containing air in the right paracolic gutter measures 5.2 x 2.7 by 3.1 cm and appears relatively stable in size. The percutaneous catheter does traverse this collection. Enhancing fluid collection containing a small amount of air abutting the fibroid uterus anteriorly measures 5.4 by 1.4 by 1.0 cm and has minimally decreased in size in the interval. Enhancing fluid collection in the pelvis abutting the rectosigmoid colon image 3/80 appears stable in size measuring 2.6 x 1.6 cm. Small amount of free fluid in the left lower quadrant and diffuse body wall edema appear unchanged. Musculoskeletal: Osseous structures appear stable. IMPRESSION: 1. Dilated small bowel loops with air-fluid levels worrisome for small bowel obstruction. Oral contrast reaches mid small bowel. 2. New right-sided percutaneous nephrostomy with distal catheter tip in the right renal pelvis. There is severe right-sided hydronephrosis which has minimally decreased from the prior study. Right ureter remains dilated to the level of the pelvic inlet, mildly decreased from the prior study. Layering hyperdensity in the right renal pelvis may represent bladder infection. 3. Stable percutaneous drainage catheters in the left lower quadrant and right lower quadrant. 4. Multiple complex fluid collections, some of which contain air, are again seen throughout the abdomen and pelvis. The larger collections are stable in size. Smaller collections have minimally decreased in size. 5. Stable small bilateral pleural effusions and bibasilar atelectasis. 6. Stable splenomegaly  with near complete replacement of the spleen with low-attenuation and peripheral enhancement. Findings are worrisome for splenic infarct. 7. Stable splenic vein thrombosis. 8. Stable body wall edema. Aortic Atherosclerosis (ICD10-I70.0). Electronically Signed   By: Darliss Cheney M.D.   On: 09/04/2022 15:36    Anti-infectives: Anti-infectives (From admission, onward)    Start     Dose/Rate Route Frequency Ordered Stop   09/04/22 2200  metroNIDAZOLE (FLAGYL) tablet 500 mg        500 mg Oral Every 12 hours 09/04/22 0741     08/30/22 0830  ceFEPIme (MAXIPIME) 2 g in sodium chloride 0.9 % 100 mL IVPB        2 g 200 mL/hr over 30 Minutes Intravenous Every 8 hours 08/30/22 0826     08/23/22 2100  ceFEPIme (MAXIPIME) 2 g in sodium chloride 0.9 % 100 mL IVPB  Status:  Discontinued        2 g 200 mL/hr over 30 Minutes Intravenous Every 12 hours 08/23/22 1357 08/30/22 0826   08/23/22 0400  micafungin (MYCAMINE) 100 mg in sodium chloride 0.9 % 100 mL IVPB        100 mg 105 mL/hr over 1 Hours Intravenous Daily 08/23/22 0242     08/22/22 1800  metroNIDAZOLE (FLAGYL) IVPB 500 mg  Status:  Discontinued        500 mg 100 mL/hr over 60 Minutes Intravenous Every 12 hours 08/22/22 1711 09/04/22 0741   08/22/22 1800  ceFEPIme (MAXIPIME) 2 g in sodium chloride 0.9 % 100 mL IVPB  Status:  Discontinued        2 g 200 mL/hr over 30 Minutes Intravenous Every 8 hours 08/22/22 1716 08/23/22 1357   08/22/22 1724  metroNIDAZOLE (FLAGYL) 500 MG/100ML IVPB       Note to Pharmacy: [name]: cabinet override      08/22/22 1724 08/23/22 0845   08/19/22 1030  cefTRIAXone (ROCEPHIN) 1 g in sodium chloride 0.9 % 100 mL IVPB  Status:  Discontinued        1 g 200 mL/hr over 30 Minutes Intravenous Every 24 hours 08/19/22 0937 08/22/22 1715       Assessment/Plan: POD 14 s/p exploratory laparotomy, right hemicolectomy, ileostomy, mucus fistula, biopsy of pelvic mass by Dr. 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 Margins negative. 0/43 lymph nodes.  - CEA - 143, CA 125: 40 and CA 19-9: 50 - Has had CT CAP since admission - Discussed case with attending - given mass is cancerous, the concern is that this was not resectable during her initial operation. If this progresses and causes another obstruction I think her options would be limited surgically. Dr. Mosetta Putt of Oncology has seen and felt her cancer is likely incurable and her overall prognosis is poor. They will plan to see her back in 4-6 weeks to discuss options for chemo. Palliative is following. Will f/u on their note and recs.    - High ileostomy output. Cont fiber, iron and immodium. Add lomotil BID 7/19. Monitor output, Cr, electrolytes. Avoid sugary drinks.  - WOC for stoma teaching and assistance with pouching. - BID wet to dry to midline - CT 7/14 obtained for drain appearing feculent - this showed small volume fluid within the abdomen and pelvis with several collections demonstrating rim-enhancement suggestive of abscesses -  lower right pericolic gutter fluid collection measures 5.2 x 3.5 x 4.1 cm and collection along the anterior margin of the uterine fundus measures approximately 7.2 x 1.8 x 5.1 cm. Surgical drain is near both of these collections but does not necessarily course through them completely. IR consulted for drainage. They felt the intra-abdominal fluid collections are small and look like post-op blood. Cont abx for now.  WBC up today but clinically stable. May need repeat CT scan over the weekend if fever, worsening leukocytosis, clinical change etc.  - Cont JP drain - no longer feculent but is purulent    FEN: TF's per [facility]. FLD will adv to soft. IVF per [facility].  Can plan on weaning TF if tol soft VTE: SCDs, Heparin gtt. Would hold on changing to DOAC for now.  ID: Cefepime/flagyl/micafungin Foley: Removed,  Monitor. Urology following. S/p R PCN Dispo - As above   Per [facility] New stroke - Seen on MRI 7/10, suspected thromboembolic, defer to primary team/stroke team for further w/u and management.  PE - heparin drip per primary.  HTN HLD GERD COPD R hydro - s/p R PCN CT 7/14 also suggestion of splenic infarct and bilateral pleural effusions    LOS: 18 days    Axel Filler 09/05/2022

## 2022-09-05 NOTE — Plan of Care (Signed)
  Problem: Clinical Measurements: Goal: Diagnostic test results will improve Outcome: Not Progressing   

## 2022-09-05 NOTE — Progress Notes (Signed)
Referring Physician(s): Ramirez,Armando  Supervising Physician: Malachy Moan  Patient Status:  [facility] - In-pt  Chief Complaint: Obstructive pelvic mass s/p ex lap 7/6 with right hemicolectomy. Ileostomy S/p PCN 7/15 for right hydronephrosis  Subjective: Patient known to IR from recent R PCN placement due to obstructive right hydronephrosis.  Her cancerous mass is thought to be non-resectable and non-curable. Current care plan to proceed with palliative chemo/radiation once healed.  She is clinically stable, however CT Abd Pelv obtained yesterday due to feculent appearing drain output. This showed several fluid collections and IR was consulted for aspiration/drainage. After multiple discussion and revisiting her chart/labs this AM, decision made to proceed with drain placement.   Allergies: Iodinated contrast media, Aspirin, Ibuprofen, Sulfonamide derivatives, and Penicillins  Medications: Prior to Admission medications   Medication Sig Start Date End Date Taking? Authorizing Provider  cetirizine (ZYRTEC) 10 MG tablet Take 1 tablet (10 mg total) by mouth daily. Patient taking differently: Take 10 mg by mouth daily as needed for allergies or rhinitis. 10/09/19  Yes Hawks, Christy A, FNP  ferrous sulfate 325 (65 FE) MG EC tablet Take 325 mg by mouth daily.   Yes [provider]  fluticasone (FLONASE) 50 MCG/ACT nasal spray Place 2 sprays into both nostrils daily. Patient taking differently: Place 2 sprays into both nostrils at bedtime as needed for allergies or rhinitis. 10/09/19  Yes Hawks, Christy A, FNP  lisinopril (ZESTRIL) 40 MG tablet Take 40 mg by mouth in the morning.   Yes [provider]  lovastatin (MEVACOR) 20 MG tablet Take 20 mg by mouth at bedtime.   Yes [provider]  PROAIR HFA 108 (90 Base) MCG/ACT inhaler Inhale 2 puffs into the lungs every 6 (six) hours as needed for wheezing or shortness of breath.   Yes [provider]   sertraline (ZOLOFT) 100 MG tablet Take 150 mg by mouth in the morning.   Yes [provider]  SYMBICORT 160-4.5 MCG/ACT inhaler Inhale 2 puffs into the lungs in the morning and at bedtime.   Yes [provider]     Vital Signs: BP (!) 127/56 (BP Location: Right Leg)   Pulse 99   Temp 98 F (36.7 C) (Oral)   Resp 20   Ht 5' 5.98" (1.676 m)   Wt 172 lb 2.9 oz (78.1 kg)   SpO2 98%   BMI 27.80 kg/m   Physical Exam  Imaging: CT ABDOMEN PELVIS W CONTRAST  Result Date: 09/04/2022 CLINICAL DATA:  Postoperative abdominal pain. Right hemicolectomy and ileostomy 13 days ago. EXAM: CT ABDOMEN AND PELVIS WITH CONTRAST TECHNIQUE: Multidetector CT imaging of the abdomen and pelvis was performed using the standard protocol following bolus administration of intravenous contrast. RADIATION DOSE REDUCTION: This exam was performed according to the departmental dose-optimization program which includes automated exposure control, adjustment of the mA and/or kV according to patient size and/or use of iterative reconstruction technique. CONTRAST:  75mL OMNIPAQUE IOHEXOL 350 MG/ML SOLN COMPARISON:  CT abdomen and pelvis 08/30/2022 FINDINGS: Lower chest: There are small bilateral pleural effusions and bibasilar atelectasis similar to the prior examination. Hepatobiliary: No focal liver abnormality is seen. No gallstones, gallbladder wall thickening, or biliary dilatation. Pancreas: Unremarkable. No pancreatic ductal dilatation or surrounding inflammatory changes. Spleen: The spleen appears enlarged. In his nearly completely replaced with low-attenuation with some peripheral capsular enhancement. Appearance is similar to the prior study. There is no surrounding inflammatory stranding. Adrenals/Urinary Tract: The bilateral adrenal glands and left kidney are within  normal limits. There is a new right-sided percutaneous nephrostomy with distal catheter tip in the right renal pelvis. There is severe  right-sided hydronephrosis with some layering hyperdensity in the calices. Hydronephrosis has minimally decreased when compared to the prior study. The right ureter remains dilated to the level of the pelvic inlet, mildly decreased from prior. The bladder appears within normal limits. Stomach/Bowel: Right-sided ostomy is again noted. Oral contrast reaches mid small bowel. Mid and proximal small bowel loops are dilated measuring up to 4 cm and contain air-fluid levels. The tip of an enteric tube is in the gastric antrum. There are nondilated small bowel loops in the right lower quadrant, although definitive transition point is not visualized. Vascular/Lymphatic: Aortic atherosclerosis. Thrombus in the splenic vein appears unchanged on image 3/26. No enlarged abdominal or pelvic lymph nodes. Reproductive: Uterus is enlarged containing calcified fibroids. The ovaries are not well delineated. Other: Again seen is a percutaneous drainage catheter entering the anterior left lower quadrant abdomen. The catheter traverses the pelvis with distal tip in the right lower quadrant. There is an ill-defined septated enhancing low-density collection in the pelvis abutting the uterus and sigmoid colon. Small foci of air are seen within the collection similar to the prior study. This area measures proximally 9.5 x 6.3 by 10.8 cm and appears similar to the prior examination. Enhancing fluid collection containing air in the right paracolic gutter measures 5.2 x 2.7 by 3.1 cm and appears relatively stable in size. The percutaneous catheter does traverse this collection. Enhancing fluid collection containing a small amount of air abutting the fibroid uterus anteriorly measures 5.4 by 1.4 by 1.0 cm and has minimally decreased in size in the interval. Enhancing fluid collection in the pelvis abutting the rectosigmoid colon image 3/80 appears stable in size measuring 2.6 x 1.6 cm. Small amount of free fluid in the left lower quadrant and  diffuse body wall edema appear unchanged. Musculoskeletal: Osseous structures appear stable. IMPRESSION: 1. Dilated small bowel loops with air-fluid levels worrisome for small bowel obstruction. Oral contrast reaches mid small bowel. 2. New right-sided percutaneous nephrostomy with distal catheter tip in the right renal pelvis. There is severe right-sided hydronephrosis which has minimally decreased from the prior study. Right ureter remains dilated to the level of the pelvic inlet, mildly decreased from the prior study. Layering hyperdensity in the right renal pelvis may represent bladder infection. 3. Stable percutaneous drainage catheters in the left lower quadrant and right lower quadrant. 4. Multiple complex fluid collections, some of which contain air, are again seen throughout the abdomen and pelvis. The larger collections are stable in size. Smaller collections have minimally decreased in size. 5. Stable small bilateral pleural effusions and bibasilar atelectasis. 6. Stable splenomegaly with near complete replacement of the spleen with low-attenuation and peripheral enhancement. Findings are worrisome for splenic infarct. 7. Stable splenic vein thrombosis. 8. Stable body wall edema. Aortic Atherosclerosis (ICD10-I70.0). Electronically Signed   By: Darliss Cheney M.D.   On: 09/04/2022 15:36   VAS [place] UPPER EXTREMITY VENOUS DUPLEX  Result Date: 09/01/2022 UPPER VENOUS STUDY  Patient Name:  LAPORTIA CARLEY  Date of Exam:   09/01/2022 Medical Rec #: [id number]      Accession #:    [id number] Date of Birth: [birth year]      Patient Gender: F Patient Age:   29 years Exam Location:  [facility] Procedure:      VAS [place] UPPER EXTREMITY VENOUS DUPLEX Referring Phys: AMRIT ADHIKARI --------------------------------------------------------------------------------  Indications: edema Anticoagulation:  Heparin. Limitations: Body habitus, poor ultrasound/tissue interface and patient positioning. Comparison Study: No  previous study. Performing Technologist: McKayla Maag RVT, [place]  Examination Guidelines: A complete evaluation includes B-mode imaging, spectral Doppler, color Doppler, and power Doppler as needed of all accessible portions of each vessel. Bilateral testing is considered an integral part of a complete examination. Limited examinations for reoccurring indications may be performed as noted.  Right Findings: +----------+------------+---------+-----------+----------+--------------+ RIGHT     CompressiblePhasicitySpontaneousProperties   Summary     +----------+------------+---------+-----------+----------+--------------+ Subclavian                                          Not visualized +----------+------------+---------+-----------+----------+--------------+  Left Findings: +----------+------------+---------+-----------+----------+--------------+ LEFT      CompressiblePhasicitySpontaneousProperties   Summary     +----------+------------+---------+-----------+----------+--------------+ IJV           Full       Yes       Yes                             +----------+------------+---------+-----------+----------+--------------+ Subclavian    Full       Yes       Yes                             +----------+------------+---------+-----------+----------+--------------+ Axillary      Full       Yes       Yes                             +----------+------------+---------+-----------+----------+--------------+ Brachial      Full       Yes       Yes                             +----------+------------+---------+-----------+----------+--------------+ Radial        Full                                                 +----------+------------+---------+-----------+----------+--------------+ Ulnar         Full                                     limited     +----------+------------+---------+-----------+----------+--------------+ Cephalic      Full                                                  +----------+------------+---------+-----------+----------+--------------+ Basilic                                             Not visualized +----------+------------+---------+-----------+----------+--------------+  Summary:  Left: No evidence of deep vein thrombosis in the upper extremity. No evidence of superficial vein thrombosis in the upper extremity. However, unable to visualize the basilic vein.  *  See table(s) above for measurements and observations.  Diagnosing physician: Waverly Ferrari MD Electronically signed by Waverly Ferrari MD on 09/01/2022 at 4:49:19 PM.    Final    [place] EKG SITE RITE  Result Date: 09/01/2022 If Site Rite image not attached, placement could not be confirmed due to current cardiac rhythm.   Labs:  CBC: Recent Labs    09/02/22 0434 09/03/22 0729 09/04/22 0405 09/05/22 0037  WBC 16.2* 17.5* 17.1* 20.3*  HGB 8.5* 8.8* 8.5* 8.2*  HCT 28.6* 29.2* 27.9* 27.4*  PLT 1,044* 1,147* 1,254* 1,279*    COAGS: Recent Labs    08/21/22 0259 09/05/22 0037  INR 1.1 1.2    BMP: Recent Labs    09/01/22 0319 09/02/22 0434 09/03/22 0729 09/04/22 0405  NA 136 134* 133* 133*  K 4.0 3.8 3.6 3.6  CL 111 111 110 111  CO2 17* 18* 15* 17*  GLUCOSE 92 110* 110* 100*  BUN 20 19 21 23   CALCIUM 8.0* 7.7* 7.7* 8.0*  CREATININE 0.66 0.78 0.78 0.93  GFRNONAA >60 >60 >60 >60    LIVER FUNCTION TESTS: Recent Labs    08/22/22 2052 08/23/22 0922 08/31/22 0150 09/04/22 0405  BILITOT 1.9* 1.4* 0.5 0.1*  AST 9* 14* 17 18  ALT 8 7 12 11   ALKPHOS 44 35* 91 71  PROT 4.6* 4.6* 4.7* 4.6*  ALBUMIN 2.7* 2.4* 1.5* 1.5*    Assessment and Plan: POD 14 s/p exploratory laparotomy, right hemicolectomy, ileostomy, mucus fistula, biopsy of pelvic mass by Dr. Bedelia Person 7/6 for Colonic perforation secondary to obstructing pelvic mass S/p R PCN placement for right hydronephrosis Repeat imaging yesterday showing multiple small intra-abdominal  fluid collections.  Although making slow clinical improvement, her WBC is elevated from 17.1  20.3 today. Dr. Archer Asa has reviewed the imaging and case and recommends proceeding with drain placement.  Discussed held with patient this morning.  She is in agreement.  NPO currently, TFs held. Heparin held.   Risks and benefits discussed with the patient including bleeding, infection, damage to adjacent structures, bowel perforation/fistula connection, and sepsis.  All of the patient's questions were answered, patient is agreeable to proceed. Consent signed and in chart.  Electronically Signed: Hoyt Koch, PA 09/05/2022, 10:31 AM   I spent a total of 25 Minutes at the the patient's bedside AND on the patient's hospital floor or unit, greater than 50% of which was counseling/coordinating care for intra-abdominal abscesses.

## 2022-09-05 NOTE — Progress Notes (Signed)
Daily Progress Note   Patient Name: Debra Sharp       Date: 09/05/2022 DOB: 1950-12-26  Age: 72 y.o. MRN#: [id number] Attending Physician: Willeen Niece, MD Primary Care Physician: Clayborn Heron, MD Admit Date: 08/18/2022  Reason for Consultation/Follow-up: Establishing goals of care  Subjective: Medical records reviewed including progress notes, labs, imaging. Patient assessed at the bedside.  She is very glad about her diet advancing and the dinner she was able to eat.  She is feeling well after today's procedure.  Her son Molly Maduro is present visiting.  He is appreciative of the FMLA paperwork, states it was approved.  Patient remains agreeable to discussing CODE STATUS further with her son and PMT today.  Explored her thoughts and feelings.  She reflects on her sister's illness, sharing that her sister lived another 2 months after spending 2 months on the ventilator. Patient does not wish to set any limits on how long she would find ventilator dependence to be acceptable. She would also like to remain a full code. Her rationale is that if she is committed to aggressive care now, she would not want to change course for a cardiac arrest but rather would prefer to stay aggressive for CPR attempt.   Questions and concerns addressed. PMT will continue to support holistically.   Length of Stay: 18   Physical Exam Vitals and nursing note reviewed.  Constitutional:      General: She is not in acute distress.    Appearance: She is ill-appearing.     Comments: Cortrak in place  Cardiovascular:     Rate and Rhythm: Normal rate.  Pulmonary:     Effort: Pulmonary effort is normal. No respiratory distress.  Neurological:     Mental Status: She is alert.  Psychiatric:        Mood and Affect:  Mood normal.        Behavior: Behavior normal.            Vital Signs: BP (!) 127/56 (BP Location: Right Leg)   Pulse 99   Temp 98 F (36.7 C) (Oral)   Resp 20   Ht 5' 5.98" (1.676 m)   Wt 78.1 kg   SpO2 98%   BMI 27.80 kg/m  SpO2: SpO2: 98 % O2 Device: O2 Device: Room Air O2 Flow Rate: O2 Flow  Rate (L/min): 6 L/min      Palliative Assessment/Data: 20-30%    Palliative Care Assessment & Plan   Patient Profile: 72 y.o. female  with past medical history of iron deficiency anemia, HTN, HLD, thrombocytopenia, COPD, CHF presented to ED on 08/18/22 with complaints of generalized weakness, nausea/vomiting, and abdominal pain. Patient was admitted on 08/18/2022 with acute on chronic IDA/blood loss anemia, incidental finding of age indeterminate infarct, thrombocythemia, hypokalemia.  Neurology was consulted in the setting of incidental findings of acute stroke on MRI - it was felt strokes were cardiac source and TEE was recommended, which patient refused. Urology was consulted for management of right hydronephrosis - felt it is due to obstruction from pelvic mass; after mass was surgically removed, state kidney can not be obstructed for another 6 months without losing it. GI was consulted for anemia and abdominal imaging - plan was for colonoscopy and endoscopy, however patient did not tolerate the prep and upon further imaging CT showed free air with concern for perforation and Surgery was consulted - patient underwent emergent exlap, creation of mucus fistula, right hemicolectomy, colostomy, and biopsy of pelvic mass on 7/6. She was subsequently intubated, on pressors, and admitted to ICU after surgery. She was extubated on 7/8 but remained encephalopathic, which has been gradually improving. Her hospitalization has been complicated by septic shock secondary to peritonitis as well as acute PE. 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 7/11 patient was stable enough for transfer  out of the ICU.   Palliative Medicine was consulted for GOC in context of malignancy and incomplete resection.  Assessment: Goals of care conversation Acute PE Multiple acute embolic strokes Acute hypoxic respiratory failure status post extubation, improved Septic shock secondary to acute peritonitis due to chronic perforation/obstructing pelvic mass AKI, resolved  Recommendations/Plan: Continue full code/full scope treatment Patient's goals are clear for aggressive interventions without limitations Psychosocial and emotional support provided PMT will follow peripherally and remains available as needed. Please secure chat or call team line for urgent needs   Prognosis: Poor long-term prognosis  Discharge Planning: To Be Determined  Care plan was discussed with patient, patient's son   MDM: High   Hasna Stefanik Jeni Salles, PA-C  Palliative Medicine Team Team phone # [phone]  Thank you for allowing the Palliative Medicine Team to assist in the care of this patient. Please utilize secure chat with additional questions, if there is no response within 30 minutes please call the above phone number.  Palliative Medicine Team providers are available by phone from 7am to 7pm daily and can be reached through the team cell phone.  Should this patient require assistance outside of these hours, please call the patient's attending physician.

## 2022-09-05 NOTE — Plan of Care (Signed)
NA

## 2022-09-05 NOTE — Progress Notes (Signed)
PROGRESS NOTE    Debra Sharp  IRC:[id number] DOB: 10/13/1950 DOA: 08/18/2022 PCP: Clayborn Heron, MD   Brief Narrative:  This 72 year old female with past medical history of hypertension, hyperlipidemia, systolic CHF, thrombocytopenia, COPD, asthma who initially presented with weakness, abdominal pain. Workup showed anemia concerning for upper GI bleed. CT abdomen/pelvis showed colonic distention, possible obstruction. GI consulted, plan was for EGD but she could not tolerate prep. Also found to have right-sided hydronephrosis, incidental acute stroke on CT/MRI. Repeat CT abdomen/pelvis on 7/6 showed new intraperitoneal free air with presumed perforation of the colon. Surgery consulted and she underwent emergent exploratory laparotomy, right hemicolectomy, ileostomy with fistula, biopsy of pelvic mass. Patient was left intubated and was transferred to ICU for pain management. 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 Palliative care consulted. General surgery following. Currently on tube feeding. Hospital course also remarkable for finding of acute PE now on heparin drip. Now extubated and transferred to [facility] service on 7/12. Plan was for TEE which has been canceled because patient declined. Palliative care, urology ,surgery ,neurology were following.     Currently titrating tube feeds.  Surgery following and considering repeat imaging.  Patient underwent new percutaneous drain in the right lower quadrant.  Assessment & Plan:   Principal Problem:   GI bleed Active Problems:   Symptomatic anemia   Thrombocytosis   Iron deficiency anemia   Abnormal CT of the abdomen   Colon distention   History of cardioembolic cerebrovascular accident (CVA)   Silent cerebral infarction (HCC)   Bowel perforation (HCC)   Ventilator dependent (HCC)   Pelvic mass   Change in bowel habits   Abnormal CT scan, gastrointestinal tract   Arterial hypotension   Septic shock (HCC)   Fecal occult  blood test positive   Uterine leiomyoma   Hydroureteronephrosis   Sepsis with acute hypoxic respiratory failure and septic shock (HCC)   Encephalopathy acute   Perforation bowel (HCC)   Malnutrition of moderate degree   Cerebrovascular accident (CVA) due to embolism of precerebral artery (HCC)   Acute metabolic encephalopathy   Delirium   Adenocarcinoma of small bowel (HCC)  Septic shock secondary to acute peritonitis: Colonic perforation due to obstructing pelvic mass:  Status post exploratory laparotomy with right hemicolectomy, ileostomy creation, mucus fistual formation, biopsy of pelvic mass, and intraoperative of vasculature using indocyanine green.   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   Currently on broad spectrum anti-infectives with micafungin, Flagyl, cefepime.  culture NGTD.   On tube feeds, will continue for now until she's able to maintain adequate PO intake by mouth. RD c/s for tube feeds.  General surgery following, appreciate recs.   IR had been consulted for drainage of intraabdominal fluid collections, but they thought intraabdominal fluid collections are small and appeared c/w post op blood Repeat CT scan being considered by surgery if worsening fever, leukocytosis, etc Leukocytosis persist.  CT 7/13 with small volume fluid within abdomen and pelvis with several collections demonstrating rim enhancement suggestive of abscesses, new, near complete hypoattenuation of splenic parenchyma with subcapsular low density fluid along peripheral margin of spleen, multiple mildly dilated smal bowel loops with numerous air fluid levels (post op ileus), severe R hydroureteronephrosis, new mild left hydronephrosis.  Small bilateral effusions.  Large complex mass occupying majority of low right pelvis.  General surgery recommended Repeat CT A/P due to persistent leucocytosis which showed new collection. Patient has new PCT drain.   Newly Diagnosed Terminal  Ileum Adenocarcinoma  with Peritoneal Metastasis:  Culprit for colonic perforation.  Initially concern for gynecological malignancy with elevated CA125, CA 19-19 but pathology showed small bowel primary.  Appreciate Dr. Latanya Maudlin recommendations.  Possible chemo after she recovers in 1-2 months.  ? Gyn follow up for debulking surgery of pelvic mass.  Overall prognosis is poor.  Recommending indefinite anticoagulation.   Chronic systolic CHF:  Echo on 08/18/2020 showed EF of 40 to 45%, global hypokinesis.  Previous echo on March 2024 showed EF of 30 to 35%.  We recommend follow-up with cardiology as an outpatient. She has bilateral LE edema - follow BNP Trial low dose lasix and follow.   Acute metabolic encephalopathy:  > Resolved. Likely from sepsis, delirium, CVAs.  This morning she remains alert oriented.  Confusion is intermittent.  -repeat CT brain neg for acute findings She is much alert, following commands.    Acute CVA: MRI 7/4 showed single punctate enhancement in the R frontal parietal white matter (if concern for metastatic disease, follow up in 12 weeks) - MRI done on 7/10 with acute and late acute infarcts in multiple vascular territories consistent with central embolic source.   Appreciate neurology recs -> bilateral anterior/posterior infarcts with pattern more concerning for hypotension in setting of severe anemia and septic shock with hypotension (ddx includes hypercoagulable state with suspected malignancy).  Recommending transition to PO anticoagulation per primary team.  Atorvastatin 20 mg.  EEG done on 7/9 did not show any seizure.  There was plan for TEE but patient has declined. .  Recommended SNF on discharge She has left-sided weakness. Hardly  able to get up from the bed.  Currently on heparin drip, will change to Eliquis  when she does not need any more surgery and is able to tolerate po intake   Acute PE: CT angiogram showed acute PE.  Currently on heparin drip.  No evidence of  right heart strain.  Will start her on Eliquis when surgery clears   AKI/hyperkalemia/hypophosphatemia:   Currently kidney function are  stable. Phosphorus being supplemented   Hydronephrosis secondary to abdominal mass: Urology following.  Follow-up CT showed persistent severe right hydroureteronephrosis.  IR did  PCNL placement on 7/15 (pulled overnight 7/15) -urology saw pt 7/16--and still flushes easily -mild left hydro will need to be followed outpatient.   Acute on chronic anemia of renal disease /acute blood loss anemia secondary to surgery/chronic GI bleed due to small bowel adenocarcinoma:  Monitor hemoglobin.  Transfuse if less than 7.  Currently on PPI. Relatively stable.    Hypertension: Off hypertensives.  Monitor blood pressure   Hyperlipidemia: Currently on Lipitor   Hyperglycemia: A1c 5.4, d/c SSI Currently on sliding scale insulin.     Left upper extremity edema:  Negative for DVT   Goals of care: Multiple comorbidities.  Palliative care were following.  She's currently full code/full scope of care.   DVT prophylaxis: Heparin gtt Code Status: Full code Family Communication:No family at bed side. Disposition Plan:   Status is: Inpatient Remains inpatient appropriate because:  Need for continued inpatient care.    Consultants:  Surgery Urology palliative  Procedures:  Echo EEG LUE duplex  Antimicrobials:  Anti-infectives (From admission, onward)    Start     Dose/Rate Route Frequency Ordered Stop   09/04/22 2200  metroNIDAZOLE (FLAGYL) tablet 500 mg        500 mg Oral Every 12 hours 09/04/22 0741     08/30/22 0830  ceFEPIme (MAXIPIME) 2 g in sodium chloride  0.9 % 100 mL IVPB        2 g 200 mL/hr over 30 Minutes Intravenous Every 8 hours 08/30/22 0826     08/23/22 2100  ceFEPIme (MAXIPIME) 2 g in sodium chloride 0.9 % 100 mL IVPB  Status:  Discontinued        2 g 200 mL/hr over 30 Minutes Intravenous Every 12 hours 08/23/22 1357 08/30/22 0826    08/23/22 0400  micafungin (MYCAMINE) 100 mg in sodium chloride 0.9 % 100 mL IVPB        100 mg 105 mL/hr over 1 Hours Intravenous Daily 08/23/22 0242     08/22/22 1800  metroNIDAZOLE (FLAGYL) IVPB 500 mg  Status:  Discontinued        500 mg 100 mL/hr over 60 Minutes Intravenous Every 12 hours 08/22/22 1711 09/04/22 0741   08/22/22 1800  ceFEPIme (MAXIPIME) 2 g in sodium chloride 0.9 % 100 mL IVPB  Status:  Discontinued        2 g 200 mL/hr over 30 Minutes Intravenous Every 8 hours 08/22/22 1716 08/23/22 1357   08/22/22 1724  metroNIDAZOLE (FLAGYL) 500 MG/100ML IVPB       Note to Pharmacy: [name]: cabinet override      08/22/22 1724 08/23/22 0845   08/19/22 1030  cefTRIAXone (ROCEPHIN) 1 g in sodium chloride 0.9 % 100 mL IVPB  Status:  Discontinued        1 g 200 mL/hr over 30 Minutes Intravenous Every 24 hours 08/19/22 0937 08/22/22 1715       Subjective: Patient was seen and examined at bed side, overnight events noted,  She reports pain is reasonably controlled.  She underwent new percutaneous drain in RLQ. She has colostomy with brownish stool noted.   Objective: Vitals:   09/05/22 1135 09/05/22 1140 09/05/22 1145 09/05/22 1217  BP: (!) 123/58 (!) 124/43 (!) 128/48 (!) 136/49  Pulse: 94 91 90 96  Resp: 12 12 12    Temp:    (!) 97.5 F (36.4 C)  TempSrc:    Oral  SpO2: 100% 100% 100% 98%  Weight:      Height:        Intake/Output Summary (Last 24 hours) at 09/05/2022 1330 Last data filed at 09/05/2022 1039 Gross per 24 hour  Intake 702.58 ml  Output 2370 ml  Net -1667.42 ml   Filed Weights   09/01/22 0400 09/02/22 0432  Weight: 77.2 kg 78.1 kg    Examination:  General exam: Appears calm and comfortable, deconditioned, NAD  Respiratory system: Clear to auscultation. Respiratory effort normal. RR 16 Cardiovascular system: S1 & S2 heard, RRR. No JVD, murmurs, rubs, gallops or clicks. No pedal edema. Gastrointestinal system: Abdomen is non distended, soft and  mildly tender. Colostomy noted with brown colored stool in bag. Normal bowel sounds heard. Central nervous system: Alert and oriented. No focal neurological deficits. Extremities: Symmetric 5 x 5 power. Skin: No rashes, lesions or ulcers Psychiatry: Judgement and insight appear normal. Mood & affect appropriate.     Data Reviewed: I have personally reviewed following labs and imaging studies  CBC: Recent Labs  Lab 09/01/22 0319 09/02/22 0434 09/03/22 0729 09/04/22 0405 09/05/22 0037  WBC 18.6* 16.2* 17.5* 17.1* 20.3*  HGB 9.7* 8.5* 8.8* 8.5* 8.2*  HCT 32.2* 28.6* 29.2* 27.9* 27.4*  MCV 81.5 81.5 82.0 82.3 81.5  PLT 932* 1,044* 1,147* 1,254* 1,279*   Basic Metabolic Panel: Recent Labs  Lab 08/30/22 0123 08/31/22 0150 09/01/22 0319 09/02/22  7829 09/03/22 0729 09/04/22 0405  NA 137 133* 136 134* 133* 133*  K 4.0 4.1 4.0 3.8 3.6 3.6  CL 106 108 111 111 110 111  CO2 21* 17* 17* 18* 15* 17*  GLUCOSE 127* 130* 92 110* 110* 100*  BUN 30* 24* 20 19 21 23   CREATININE 0.79 0.71 0.66 0.78 0.78 0.93  CALCIUM 7.5* 7.5* 8.0* 7.7* 7.7* 8.0*  MG 1.8  --   --   --  1.8 1.7  PHOS 2.4* 2.8 3.8  --  4.1 4.2   GFR: Estimated Creatinine Clearance: 59.4 mL/min (by C-G formula based on SCr of 0.93 mg/dL). Liver Function Tests: Recent Labs  Lab 08/31/22 0150 09/04/22 0405  AST 17 18  ALT 12 11  ALKPHOS 91 71  BILITOT 0.5 0.1*  PROT 4.7* 4.6*  ALBUMIN 1.5* 1.5*   No results for input(s): "LIPASE", "AMYLASE" in the last 168 hours. No results for input(s): "AMMONIA" in the last 168 hours. Coagulation Profile: Recent Labs  Lab 09/05/22 0037  INR 1.2   Cardiac Enzymes: No results for input(s): "CKTOTAL", "CKMB", "CKMBINDEX", "TROPONINI" in the last 168 hours. BNP (last 3 results) No results for input(s): "PROBNP" in the last 8760 hours. HbA1C: No results for input(s): "HGBA1C" in the last 72 hours. CBG: Recent Labs  Lab 09/04/22 1959 09/05/22 0017 09/05/22 0433  09/05/22 0926 09/05/22 1302  GLUCAP 156* 170* 137* 103* 92   Lipid Profile: No results for input(s): "CHOL", "HDL", "LDLCALC", "TRIG", "CHOLHDL", "LDLDIRECT" in the last 72 hours. Thyroid Function Tests: No results for input(s): "TSH", "T4TOTAL", "FREET4", "T3FREE", "THYROIDAB" in the last 72 hours. Anemia Panel: Recent Labs    09/03/22 0729 09/04/22 0405  VITAMINB12  --  455  FOLATE  --  10.6  FERRITIN 281  --   TIBC 197*  --   IRON 15*  --    Sepsis Labs: No results for input(s): "PROCALCITON", "LATICACIDVEN" in the last 168 hours.  Recent Results (from the past 240 hour(s))  Body fluid culture w Gram Stain     Status: None   Collection Time: 08/31/22  5:03 PM   Specimen: Urine, Random; Body Fluid  Result Value Ref Range Status   Specimen Description URINE, RANDOM  Final   Special Requests Normal  Final   Gram Stain   Final    WBC PRESENT, PREDOMINANTLY MONONUCLEAR NO ORGANISMS SEEN CYTOSPIN SMEAR    Culture   Final    NO GROWTH 3 DAYS Performed at [facility], [address] [place], [place] [address]    Report Status 09/03/2022 FINAL  Final   Radiology Studies: CT ABDOMEN PELVIS W CONTRAST  Result Date: 09/04/2022 CLINICAL DATA:  Postoperative abdominal pain. Right hemicolectomy and ileostomy 13 days ago. EXAM: CT ABDOMEN AND PELVIS WITH CONTRAST TECHNIQUE: Multidetector CT imaging of the abdomen and pelvis was performed using the standard protocol following bolus administration of intravenous contrast. RADIATION DOSE REDUCTION: This exam was performed according to the departmental dose-optimization program which includes automated exposure control, adjustment of the mA and/or kV according to patient size and/or use of iterative reconstruction technique. CONTRAST:  75mL OMNIPAQUE IOHEXOL 350 MG/ML SOLN COMPARISON:  CT abdomen and pelvis 08/30/2022 FINDINGS: Lower chest: There are small bilateral pleural effusions and bibasilar atelectasis similar to the prior  examination. Hepatobiliary: No focal liver abnormality is seen. No gallstones, gallbladder wall thickening, or biliary dilatation. Pancreas: Unremarkable. No pancreatic ductal dilatation or surrounding inflammatory changes. Spleen: The spleen appears enlarged. In  his nearly completely replaced with low-attenuation with some peripheral capsular enhancement. Appearance is similar to the prior study. There is no surrounding inflammatory stranding. Adrenals/Urinary Tract: The bilateral adrenal glands and left kidney are within normal limits. There is a new right-sided percutaneous nephrostomy with distal catheter tip in the right renal pelvis. There is severe right-sided hydronephrosis with some layering hyperdensity in the calices. Hydronephrosis has minimally decreased when compared to the prior study. The right ureter remains dilated to the level of the pelvic inlet, mildly decreased from prior. The bladder appears within normal limits. Stomach/Bowel: Right-sided ostomy is again noted. Oral contrast reaches mid small bowel. Mid and proximal small bowel loops are dilated measuring up to 4 cm and contain air-fluid levels. The tip of an enteric tube is in the gastric antrum. There are nondilated small bowel loops in the right lower quadrant, although definitive transition point is not visualized. Vascular/Lymphatic: Aortic atherosclerosis. Thrombus in the splenic vein appears unchanged on image 3/26. No enlarged abdominal or pelvic lymph nodes. Reproductive: Uterus is enlarged containing calcified fibroids. The ovaries are not well delineated. Other: Again seen is a percutaneous drainage catheter entering the anterior left lower quadrant abdomen. The catheter traverses the pelvis with distal tip in the right lower quadrant. There is an ill-defined septated enhancing low-density collection in the pelvis abutting the uterus and sigmoid colon. Small foci of air are seen within the collection similar to the prior study.  This area measures proximally 9.5 x 6.3 by 10.8 cm and appears similar to the prior examination. Enhancing fluid collection containing air in the right paracolic gutter measures 5.2 x 2.7 by 3.1 cm and appears relatively stable in size. The percutaneous catheter does traverse this collection. Enhancing fluid collection containing a small amount of air abutting the fibroid uterus anteriorly measures 5.4 by 1.4 by 1.0 cm and has minimally decreased in size in the interval. Enhancing fluid collection in the pelvis abutting the rectosigmoid colon image 3/80 appears stable in size measuring 2.6 x 1.6 cm. Small amount of free fluid in the left lower quadrant and diffuse body wall edema appear unchanged. Musculoskeletal: Osseous structures appear stable. IMPRESSION: 1. Dilated small bowel loops with air-fluid levels worrisome for small bowel obstruction. Oral contrast reaches mid small bowel. 2. New right-sided percutaneous nephrostomy with distal catheter tip in the right renal pelvis. There is severe right-sided hydronephrosis which has minimally decreased from the prior study. Right ureter remains dilated to the level of the pelvic inlet, mildly decreased from the prior study. Layering hyperdensity in the right renal pelvis may represent bladder infection. 3. Stable percutaneous drainage catheters in the left lower quadrant and right lower quadrant. 4. Multiple complex fluid collections, some of which contain air, are again seen throughout the abdomen and pelvis. The larger collections are stable in size. Smaller collections have minimally decreased in size. 5. Stable small bilateral pleural effusions and bibasilar atelectasis. 6. Stable splenomegaly with near complete replacement of the spleen with low-attenuation and peripheral enhancement. Findings are worrisome for splenic infarct. 7. Stable splenic vein thrombosis. 8. Stable body wall edema. Aortic Atherosclerosis (ICD10-I70.0). Electronically Signed   By: Darliss Cheney M.D.   On: 09/04/2022 15:36    Scheduled Meds:  acetaminophen  650 mg Oral Q6H   arformoterol  15 mcg Nebulization BID   ascorbic acid  500 mg Oral Daily   atorvastatin  20 mg Oral Daily   budesonide (PULMICORT) nebulizer solution  0.5 mg Nebulization BID   Chlorhexidine Gluconate Cloth  6 each Topical Daily   diphenoxylate-atropine  1 tablet Oral BID   feeding supplement  237 mL Oral TID BM   feeding supplement (PROSource TF20)  60 mL Per Tube TID   ferrous sulfate  325 mg Oral BID WC   loperamide  2 mg Oral BID   melatonin  3 mg Oral QHS   metroNIDAZOLE  500 mg Oral Q12H   multivitamin with minerals  1 tablet Oral Daily   pantoprazole (PROTONIX) IV  40 mg Intravenous Q12H   polycarbophil  625 mg Per Tube BID   revefenacin  175 mcg Nebulization Daily   sodium chloride flush  10-40 mL Intracatheter Q12H   sodium chloride flush  5 mL Intracatheter Q8H   tamsulosin  0.4 mg Oral Daily   zinc sulfate  220 mg Oral Daily   Continuous Infusions:  sodium chloride 250 mL (09/04/22 2000)   ceFEPime (MAXIPIME) IV 2 g (09/05/22 0950)   feeding supplement (VITAL 1.5 CAL) Stopped (09/05/22 0005)   heparin 1,750 Units/hr (09/05/22 1301)   micafungin (MYCAMINE) 100 mg in sodium chloride 0.9 % 100 mL IVPB 100 mg (09/05/22 0815)   promethazine (PHENERGAN) injection (IM or IVPB)       LOS: 18 days    Time spent: 35 mins    Willeen Niece, MD [facility]   If 7PM-7AM, please contact night-coverage

## 2022-09-05 NOTE — Progress Notes (Signed)
ANTICOAGULATION CONSULT NOTE - Follow-up Note  Pharmacy Consult for Heparin Indication:  pulmonary embolus with recent stroke on MRI on 7/10  Allergies  Allergen Reactions   Iodinated Contrast Media Shortness Of Breath and Itching    Resolved with benadryl and solu-mederol. Consider premedication prior to future studies.   Aspirin Other (See Comments)    Caused an asthma attack   Ibuprofen Swelling and Other (See Comments)    Angioedema and Asthma exacerbation   Sulfonamide Derivatives Swelling and Other (See Comments)    Face swelled SEVERELY   Penicillins Rash    Patient Measurements: Height: 5' 5.98" (167.6 cm) Weight:  (bed scale broken) IBW/kg (Calculated) : 59.26 Heparin Dosing Weight: 75 kg  Vital Signs: Temp: 97.5 F (36.4 C) (07/20 2003) Temp Source: Oral (07/20 2003) BP: 135/49 (07/20 2003) Pulse Rate: 89 (07/20 2003)  Labs: Recent Labs    09/03/22 0729 09/03/22 2026 09/04/22 0405 09/04/22 0929 09/04/22 1200 09/05/22 0037 09/05/22 2057  HGB 8.8*  --  8.5*  --   --  8.2*  --   HCT 29.2*  --  27.9*  --   --  27.4*  --   PLT 1,147*  --  1,254*  --   --  1,279*  --   LABPROT  --   --   --   --   --  15.1  --   INR  --   --   --   --   --  1.2  --   HEPARINUNFRC 0.91*   < >  --    < > 0.51 0.33 0.37  CREATININE 0.78  --  0.93  --   --   --   --    < > = values in this interval not displayed.    Estimated Creatinine Clearance: 59.4 mL/min (by C-G formula based on SCr of 0.93 mg/dL).   Medical History: Past Medical History:  Diagnosis Date   Anemia    Anxiety    Arthritis    Asthma    Blood transfusion without reported diagnosis    Cataract    removed bilateral   COPD (chronic obstructive pulmonary disease) (HCC)    Depression    GERD (gastroesophageal reflux disease)    Hyperlipidemia    Hypertension      Assessment: 72 yo W with a history of HTN, HLD, HF, thrombocytopenia, COPD, asthma found to have small subsegmental acute PE with acute  and late acute CVA. No anticoagulation prior to admission. Pharmacy consulted for heparin.    Heparin was held 7/19 PM for drain placement of abd fluid collection and restarted at 1300 on 7/20 following procedure per Dr. Archer Asa. Restarted at 1750 units/hr.  2100 heparin level therapeutic at 0.37 with heparin infusion at 1750 units/hr.  No issues with the infusion or signs and symptoms of bleeding per RN.  Goal of Therapy:  Heparin level 0.3-0.5 units/ml Monitor platelets by anticoagulation protocol: Yes   Plan:   Continue heparin infusion at 1750 units/hr Monitor daily heparin level, CBC Monitor for signs/symptoms of bleeding    Stephenie Acres, PharmD PGY1 Pharmacy Resident 09/05/2022 9:58 PM

## 2022-09-05 NOTE — Plan of Care (Signed)
  Problem: Clinical Measurements: Goal: Will remain free from infection Outcome: Not Progressing Goal: Diagnostic test results will improve Outcome: Not Progressing   

## 2022-09-05 NOTE — Progress Notes (Signed)
ANTICOAGULATION CONSULT NOTE - Follow-up Note  Pharmacy Consult for Heparin Indication:  pulmonary embolus with recent stroke on MRI on 7/10  Allergies  Allergen Reactions   Iodinated Contrast Media Shortness Of Breath and Itching    Resolved with benadryl and solu-mederol. Consider premedication prior to future studies.   Aspirin Other (See Comments)    Caused an asthma attack   Ibuprofen Swelling and Other (See Comments)    Angioedema and Asthma exacerbation   Sulfonamide Derivatives Swelling and Other (See Comments)    Face swelled SEVERELY   Penicillins Rash    Patient Measurements: Height: 5' 5.98" (167.6 cm) Weight:  (bed scale broken) IBW/kg (Calculated) : 59.26 Heparin Dosing Weight: 75 kg  Vital Signs: Temp: 97.5 F (36.4 C) (07/20 1217) Temp Source: Oral (07/20 1217) BP: 136/49 (07/20 1217) Pulse Rate: 96 (07/20 1217)  Labs: Recent Labs    09/03/22 0729 09/03/22 2026 09/04/22 0405 09/04/22 0929 09/04/22 1200 09/05/22 0037  HGB 8.8*  --  8.5*  --   --  8.2*  HCT 29.2*  --  27.9*  --   --  27.4*  PLT 1,147*  --  1,254*  --   --  1,279*  LABPROT  --   --   --   --   --  15.1  INR  --   --   --   --   --  1.2  HEPARINUNFRC 0.91*   < >  --  0.46 0.51 0.33  CREATININE 0.78  --  0.93  --   --   --    < > = values in this interval not displayed.    Estimated Creatinine Clearance: 59.4 mL/min (by C-G formula based on SCr of 0.93 mg/dL).   Medical History: Past Medical History:  Diagnosis Date   Anemia    Anxiety    Arthritis    Asthma    Blood transfusion without reported diagnosis    Cataract    removed bilateral   COPD (chronic obstructive pulmonary disease) (HCC)    Depression    GERD (gastroesophageal reflux disease)    Hyperlipidemia    Hypertension      Assessment: 72 yo W with a history of HTN, HLD, HF, thrombocytopenia, COPD, asthma found to have small subsegmental acute PE with acute and late acute CVA. No anticoagulation prior to  admission. Pharmacy consulted for heparin.    Heparin has been held since last night for drain placement of abd fluid collection. Ok to resume post procedure per Dr. Archer Asa.  Goal of Therapy:  Heparin level 0.3-0.5 units/ml Monitor platelets by anticoagulation protocol: Yes   Plan:   Resume heparin infusion 1750 units/hr F/u 8hr heparin level  Monitor daily heparin level, CBC Monitor for signs/symptoms of bleeding    Ulyses Southward, PharmD, BCIDP, AAHIVP, CPP Infectious Disease Pharmacist 09/05/2022 12:35 PM

## 2022-09-05 NOTE — Procedures (Signed)
Interventional Radiology Procedure Note  Procedure: Placement of 45F drain into RLQ fluid collection yielding 50 mL turbid cloudy fluid.  Samples sent for culture.  Complications: None  Estimated Blood Loss: None  Recommendations: - Drain to JP    Signed,  Sterling Big, MD

## 2022-09-06 DIAGNOSIS — K922 Gastrointestinal hemorrhage, unspecified: Secondary | ICD-10-CM | POA: Diagnosis not present

## 2022-09-06 LAB — GLUCOSE, CAPILLARY
Glucose-Capillary: 105 mg/dL — ABNORMAL HIGH (ref 70–99)
Glucose-Capillary: 106 mg/dL — ABNORMAL HIGH (ref 70–99)
Glucose-Capillary: 108 mg/dL — ABNORMAL HIGH (ref 70–99)
Glucose-Capillary: 122 mg/dL — ABNORMAL HIGH (ref 70–99)
Glucose-Capillary: 81 mg/dL (ref 70–99)
Glucose-Capillary: 94 mg/dL (ref 70–99)
Glucose-Capillary: 98 mg/dL (ref 70–99)

## 2022-09-06 LAB — BASIC METABOLIC PANEL
Anion gap: 6 (ref 5–15)
BUN: 39 mg/dL — ABNORMAL HIGH (ref 8–23)
CO2: 15 mmol/L — ABNORMAL LOW (ref 22–32)
Calcium: 8.5 mg/dL — ABNORMAL LOW (ref 8.9–10.3)
Chloride: 113 mmol/L — ABNORMAL HIGH (ref 98–111)
Creatinine, Ser: 1.18 mg/dL — ABNORMAL HIGH (ref 0.44–1.00)
GFR, Estimated: 50 mL/min — ABNORMAL LOW (ref >60)
Glucose, Bld: 126 mg/dL — ABNORMAL HIGH (ref 70–99)
Potassium: 3.6 mmol/L (ref 3.5–5.1)
Sodium: 134 mmol/L — ABNORMAL LOW (ref 135–145)

## 2022-09-06 LAB — CBC
HCT: 25.9 % — ABNORMAL LOW (ref 36.0–46.0)
Hemoglobin: 7.6 g/dL — ABNORMAL LOW (ref 12.0–15.0)
MCH: 24.3 pg — ABNORMAL LOW (ref 26.0–34.0)
MCHC: 29.3 g/dL — ABNORMAL LOW (ref 30.0–36.0)
MCV: 82.7 fL (ref 80.0–100.0)
Platelets: 1315 10*3/uL (ref 150–400)
RBC: 3.13 MIL/uL — ABNORMAL LOW (ref 3.87–5.11)
RDW: 26.9 % — ABNORMAL HIGH (ref 11.5–15.5)
WBC: 16.3 10*3/uL — ABNORMAL HIGH (ref 4.0–10.5)
nRBC: 0 % (ref 0.0–0.2)

## 2022-09-06 LAB — HEPARIN LEVEL (UNFRACTIONATED): Heparin Unfractionated: 0.37 IU/mL (ref 0.30–0.70)

## 2022-09-06 LAB — PHOSPHORUS: Phosphorus: 3.5 mg/dL (ref 2.5–4.6)

## 2022-09-06 LAB — MAGNESIUM: Magnesium: 1.7 mg/dL (ref 1.7–2.4)

## 2022-09-06 MED ORDER — SERTRALINE HCL 50 MG PO TABS
150.0000 mg | ORAL_TABLET | Freq: Every day | ORAL | Status: DC
  Administered 2022-09-07 – 2022-09-12 (×2): 150 mg via ORAL
  Filled 2022-09-06 (×4): qty 1

## 2022-09-06 MED ORDER — SODIUM BICARBONATE 650 MG PO TABS
650.0000 mg | ORAL_TABLET | Freq: Three times a day (TID) | ORAL | Status: AC
  Administered 2022-09-06 – 2022-09-07 (×6): 650 mg via ORAL
  Filled 2022-09-06 (×7): qty 1

## 2022-09-06 MED ORDER — SODIUM CHLORIDE 0.9 % IV SOLN
2.0000 g | Freq: Two times a day (BID) | INTRAVENOUS | Status: DC
  Administered 2022-09-06 – 2022-09-12 (×14): 2 g via INTRAVENOUS
  Filled 2022-09-06 (×13): qty 12.5

## 2022-09-06 NOTE — Plan of Care (Signed)
  Problem: Health Behavior/Discharge Planning: Goal: Ability to manage health-related needs will improve Outcome: Progressing   Problem: Clinical Measurements: Goal: Ability to maintain clinical measurements within normal limits will improve Outcome: Progressing Goal: Will remain free from infection Outcome: Progressing Goal: Respiratory complications will improve Outcome: Progressing Goal: Cardiovascular complication will be avoided Outcome: Progressing   

## 2022-09-06 NOTE — Progress Notes (Signed)
   Medical records reviewed including progress notes, labs, imaging.  Patient advanced to regular diet.  Goals of care are clear at this time for full code/full scope treatment.  PMT will continue to follow peripherally.  Patient and family have been encouraged to reach out with additional needs.  Please do not hesitate to call team line if additional palliative needs are identified.  Thank you for your referral and allowing PMT to assist in [name]'s care.   Debra Sharp, Debra Sharp [facility] Palliative Medicine Team  Team Phone # [phone]   NO CHARGE

## 2022-09-06 NOTE — Progress Notes (Signed)
PROGRESS NOTE    Debra Sharp  ZOX:[id number] DOB: 1951-02-06 DOA: 08/18/2022 PCP: Clayborn Heron, MD   Brief Narrative:  This 72 year old female with past medical history of hypertension, hyperlipidemia, systolic CHF, thrombocytopenia, COPD, asthma who initially presented with weakness, abdominal pain. Workup showed anemia concerning for upper GI bleed. CT abdomen/pelvis showed colonic distention, possible obstruction. GI consulted, plan was for EGD but she could not tolerate prep. Also found to have right-sided hydronephrosis, incidental acute stroke on CT/MRI. Repeat CT abdomen/pelvis on 7/6 showed new intraperitoneal free air with presumed perforation of the colon. Surgery consulted and she underwent emergent exploratory laparotomy, right hemicolectomy, ileostomy with fistula, biopsy of pelvic mass. Patient was left intubated and was transferred to ICU for pain management. 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 Palliative care consulted. General surgery following. Currently on tube feeding. Hospital course also remarkable for finding of acute PE now on heparin drip. Now extubated and transferred to [facility] service on 7/12. Plan was for TEE which has been canceled because patient declined. Palliative care, urology ,surgery ,neurology were following.     Currently titrating tube feeds.  Surgery following. Patient underwent new percutaneous drain in the right lower quadrant.  Assessment & Plan:   Principal Problem:   GI bleed Active Problems:   Symptomatic anemia   Thrombocytosis   Iron deficiency anemia   Abnormal CT of the abdomen   Colon distention   History of cardioembolic cerebrovascular accident (CVA)   Silent cerebral infarction (HCC)   Bowel perforation (HCC)   Ventilator dependent (HCC)   Pelvic mass   Change in bowel habits   Abnormal CT scan, gastrointestinal tract   Arterial hypotension   Septic shock (HCC)   Fecal occult blood test positive   Uterine  leiomyoma   Hydroureteronephrosis   Sepsis with acute hypoxic respiratory failure and septic shock (HCC)   Encephalopathy acute   Perforation bowel (HCC)   Malnutrition of moderate degree   Cerebrovascular accident (CVA) due to embolism of precerebral artery (HCC)   Acute metabolic encephalopathy   Delirium   Adenocarcinoma of small bowel (HCC)  Septic shock secondary to acute peritonitis: Colonic perforation due to obstructing pelvic mass:  Status post exploratory laparotomy with right hemicolectomy, ileostomy creation, mucus fistual formation, biopsy of pelvic mass, and intraoperative of vasculature using indocyanine green.   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   Currently on broad spectrum anti-infectives with micafungin, Flagyl, cefepime.  culture NGTD.   On tube feeds, will continue for now until she's able to maintain adequate PO intake by mouth. RD c/s for tube feeds.  General surgery following, appreciate recs.   IR had been consulted for drainage of intraabdominal fluid collections, but they thought intraabdominal fluid collections are small and appeared c/w post op blood Repeat CT scan being considered by surgery if worsening fever, leukocytosis, etc Leukocytosis persist.  CT 7/13 with small volume fluid within abdomen and pelvis with several collections demonstrating rim enhancement suggestive of abscesses, new, near complete hypoattenuation of splenic parenchyma with subcapsular low density fluid along peripheral margin of spleen, multiple mildly dilated smal bowel loops with numerous air fluid levels (post op ileus), severe R hydroureteronephrosis, new mild left hydronephrosis.  Small bilateral effusions.  Large complex mass occupying majority of low right pelvis.  General surgery recommended Repeat CT A/P due to persistent leucocytosis which showed new collection. Patient has new PCT drain.   Newly Diagnosed Terminal Ileum Adenocarcinoma with  Peritoneal Metastasis:  Culprit  for colonic perforation.  Initially concern for gynecological malignancy with elevated CA125, CA 19-19 but pathology showed small bowel primary.  Appreciate Dr. Latanya Maudlin recommendations.  Possible chemo after she recovers in 1-2 months.  ? Gyn follow up for debulking surgery of pelvic mass.  Overall prognosis is poor.  Recommending indefinite anticoagulation.   Chronic systolic CHF:  Echo on 08/18/2020 showed EF of 40 to 45%, global hypokinesis.  Previous echo on March 2024 showed EF of 30 to 35%.  We recommend follow-up with cardiology as an outpatient. She has bilateral LE edema - follow BNP Trial low dose lasix and follow.   Acute metabolic encephalopathy:  > Resolved. Likely from sepsis, delirium, CVAs.  This morning she remains alert oriented.  Confusion is intermittent.  -repeat CT brain neg for acute findings She is much alert, following commands.    Acute CVA: MRI done on 7/10 with acute and late acute infarcts in multiple vascular territories consistent with central embolic source.   Appreciate neurology recs -> bilateral anterior/posterior infarcts with pattern more concerning for hypotension in setting of severe anemia and septic shock with hypotension (ddx includes hypercoagulable state with suspected malignancy).  Recommending transition to PO anticoagulation per primary team.  Atorvastatin 20 mg.  EEG done on 7/9 did not show any seizure.  There was plan for TEE but patient has declined. .  Recommended SNF on discharge. She has left-sided weakness. Hardly  able to get up from the bed.  Currently on heparin drip, will change to Eliquis when she does not need any more surgery and is able to tolerate po intake   Acute PE: CT angiogram showed acute PE.  Currently on heparin drip.  No evidence of right heart strain.  Will start her on Eliquis when surgery clears   AKI/hyperkalemia/hypophosphatemia:   Currently kidney function are stable. Phosphorus being  supplemented.   Hydronephrosis secondary to abdominal mass: Urology following.  Follow-up CT showed persistent severe right hydroureteronephrosis.  IR did  PCNL placement on 7/15 (pulled overnight 7/15) -urology saw pt 7/16--and still flushes easily. -mild left hydro will need to be followed outpatient.   Acute on chronic anemia of renal disease /acute blood loss anemia secondary to surgery/chronic GI bleed due to small bowel adenocarcinoma:  Monitor hemoglobin.  Transfuse if less than 7.  Currently on PPI. Relatively stable.    Hypertension: Off hypertensives.  Monitor blood pressure   Hyperlipidemia: Currently on Lipitor   Hyperglycemia: A1c 5.4, d/c SSI Currently on sliding scale insulin.     Left upper extremity edema:  Negative for DVT   Goals of care: Multiple comorbidities.  Palliative care were following.  She's currently full code/full scope of care.   DVT prophylaxis: Heparin gtt Code Status: Full code Family Communication:No family at bed side. Disposition Plan:   Status is: Inpatient Remains inpatient appropriate because:  Need for continued inpatient care.    Consultants:  Surgery Urology palliative  Procedures:  Echo EEG LUE duplex  Antimicrobials:  Anti-infectives (From admission, onward)    Start     Dose/Rate Route Frequency Ordered Stop   09/06/22 1200  ceFEPIme (MAXIPIME) 2 g in sodium chloride 0.9 % 100 mL IVPB        2 g 200 mL/hr over 30 Minutes Intravenous Every 12 hours 09/06/22 0920     09/04/22 2200  metroNIDAZOLE (FLAGYL) tablet 500 mg        500 mg Oral Every 12 hours 09/04/22 0741     08/30/22 0830  ceFEPIme (MAXIPIME) 2 g in sodium chloride 0.9 % 100 mL IVPB  Status:  Discontinued        2 g 200 mL/hr over 30 Minutes Intravenous Every 8 hours 08/30/22 0826 09/06/22 0920   08/23/22 2100  ceFEPIme (MAXIPIME) 2 g in sodium chloride 0.9 % 100 mL IVPB  Status:  Discontinued        2 g 200 mL/hr over 30 Minutes Intravenous Every 12 hours  08/23/22 1357 08/30/22 0826   08/23/22 0400  micafungin (MYCAMINE) 100 mg in sodium chloride 0.9 % 100 mL IVPB        100 mg 105 mL/hr over 1 Hours Intravenous Daily 08/23/22 0242     08/22/22 1800  metroNIDAZOLE (FLAGYL) IVPB 500 mg  Status:  Discontinued        500 mg 100 mL/hr over 60 Minutes Intravenous Every 12 hours 08/22/22 1711 09/04/22 0741   08/22/22 1800  ceFEPIme (MAXIPIME) 2 g in sodium chloride 0.9 % 100 mL IVPB  Status:  Discontinued        2 g 200 mL/hr over 30 Minutes Intravenous Every 8 hours 08/22/22 1716 08/23/22 1357   08/22/22 1724  metroNIDAZOLE (FLAGYL) 500 MG/100ML IVPB       Note to Pharmacy: [name]: cabinet override      08/22/22 1724 08/23/22 0845   08/19/22 1030  cefTRIAXone (ROCEPHIN) 1 g in sodium chloride 0.9 % 100 mL IVPB  Status:  Discontinued        1 g 200 mL/hr over 30 Minutes Intravenous Every 24 hours 08/19/22 0937 08/22/22 1715       Subjective: Patient was seen and examined at bed side, overnight events noted,  Patient report pain is reasonably controlled.  She underwent a percutaneous drain in RLQ yesterday showing bloody drainage.. She has colostomy with brownish stool noted.   Objective: Vitals:   09/06/22 0004 09/06/22 0407 09/06/22 0829 09/06/22 0855  BP: 127/60 (!) 141/52 (!) 155/57   Pulse: 87 76 80   Resp: 14 14 18    Temp: 97.8 F (36.6 C) 98.1 F (36.7 C) 98 F (36.7 C)   TempSrc: Oral Axillary Oral   SpO2: 100% 100% 100% 100%  Weight:      Height:        Intake/Output Summary (Last 24 hours) at 09/06/2022 1213 Last data filed at 09/06/2022 1112 Gross per 24 hour  Intake 30 ml  Output 1370 ml  Net -1340 ml   Filed Weights   09/02/22 0432  Weight: 78.1 kg    Examination:  General exam: Appears calm and comfortable, deconditioned, not in any distress. Respiratory system: Clear to auscultation. Respiratory effort normal. RR 17 Cardiovascular system: S1 & S2 heard, RRR. No JVD, murmurs, rubs, gallops or  clicks. No pedal edema. Gastrointestinal system: Abdomen is non distended, soft and mildly tender. Colostomy noted with brown colored stool in bag. Normal bowel sounds heard. Central nervous system: Alert and oriented. No focal neurological deficits. Extremities: Symmetric 5 x 5 power. Skin: No rashes, lesions or ulcers Psychiatry: Judgement and insight appear normal. Mood & affect appropriate.     Data Reviewed: I have personally reviewed following labs and imaging studies  CBC: Recent Labs  Lab 09/02/22 0434 09/03/22 0729 09/04/22 0405 09/05/22 0037 09/06/22 0103  WBC 16.2* 17.5* 17.1* 20.3* 16.3*  HGB 8.5* 8.8* 8.5* 8.2* 7.6*  HCT 28.6* 29.2* 27.9* 27.4* 25.9*  MCV 81.5 82.0 82.3 81.5 82.7  PLT 1,044* 1,147* 1,254* 1,279* 1,315*  Basic Metabolic Panel: Recent Labs  Lab 08/31/22 0150 09/01/22 0319 09/02/22 0434 09/03/22 0729 09/04/22 0405 09/06/22 0103  NA 133* 136 134* 133* 133* 134*  K 4.1 4.0 3.8 3.6 3.6 3.6  CL 108 111 111 110 111 113*  CO2 17* 17* 18* 15* 17* 15*  GLUCOSE 130* 92 110* 110* 100* 126*  BUN 24* 20 19 21 23  39*  CREATININE 0.71 0.66 0.78 0.78 0.93 1.18*  CALCIUM 7.5* 8.0* 7.7* 7.7* 8.0* 8.5*  MG  --   --   --  1.8 1.7 1.7  PHOS 2.8 3.8  --  4.1 4.2 3.5   GFR: Estimated Creatinine Clearance: 46.8 mL/min (A) (by C-G formula based on SCr of 1.18 mg/dL (H)). Liver Function Tests: Recent Labs  Lab 08/31/22 0150 09/04/22 0405  AST 17 18  ALT 12 11  ALKPHOS 91 71  BILITOT 0.5 0.1*  PROT 4.7* 4.6*  ALBUMIN 1.5* 1.5*   No results for input(s): "LIPASE", "AMYLASE" in the last 168 hours. No results for input(s): "AMMONIA" in the last 168 hours. Coagulation Profile: Recent Labs  Lab 09/05/22 0037  INR 1.2   Cardiac Enzymes: No results for input(s): "CKTOTAL", "CKMB", "CKMBINDEX", "TROPONINI" in the last 168 hours. BNP (last 3 results) No results for input(s): "PROBNP" in the last 8760 hours. HbA1C: No results for input(s): "HGBA1C" in  the last 72 hours. CBG: Recent Labs  Lab 09/05/22 1642 09/05/22 2110 09/06/22 0007 09/06/22 0411 09/06/22 0835  GLUCAP 111* 139* 122* 105* 81   Lipid Profile: No results for input(s): "CHOL", "HDL", "LDLCALC", "TRIG", "CHOLHDL", "LDLDIRECT" in the last 72 hours. Thyroid Function Tests: No results for input(s): "TSH", "T4TOTAL", "FREET4", "T3FREE", "THYROIDAB" in the last 72 hours. Anemia Panel: Recent Labs    09/04/22 0405  VITAMINB12 455  FOLATE 10.6   Sepsis Labs: No results for input(s): "PROCALCITON", "LATICACIDVEN" in the last 168 hours.  Recent Results (from the past 240 hour(s))  Body fluid culture w Gram Stain     Status: None   Collection Time: 08/31/22  5:03 PM   Specimen: Urine, Random; Body Fluid  Result Value Ref Range Status   Specimen Description URINE, RANDOM  Final   Special Requests Normal  Final   Gram Stain   Final    WBC PRESENT, PREDOMINANTLY MONONUCLEAR NO ORGANISMS SEEN CYTOSPIN SMEAR    Culture   Final    NO GROWTH 3 DAYS Performed at [facility], [address] [place], [place] [address]    Report Status 09/03/2022 FINAL  Final  Aerobic/Anaerobic Culture w Gram Stain (surgical/deep wound)     Status: None (Preliminary result)   Collection Time: 09/05/22 12:14 PM   Specimen: Abscess  Result Value Ref Range Status   Specimen Description ABSCESS  Final   Special Requests NONE  Final   Gram Stain   Final    ABUNDANT WBC PRESENT, PREDOMINANTLY PMN ABUNDANT GRAM POSITIVE RODS FEW GRAM NEGATIVE RODS RARE GRAM POSITIVE COCCI    Culture   Final    CULTURE REINCUBATED FOR BETTER GROWTH Performed at [facility], [address] [place], [place] [address]    Report Status PENDING  Incomplete   Radiology Studies: CT GUIDED VISCERAL FLUID DRAIN BY PERC CATH  Result Date: 09/05/2022 INDICATION: Postoperative fluid collection concerning for abscess. EXAM: CT GUIDED DRAINAGE OF RIGHT LOWER QUADRANT ABSCESS TECHNIQUE: Multidetector  CT imaging of the abdomen was performed following the standard protocol without IV contrast. RADIATION DOSE REDUCTION:  This exam was performed according to the departmental dose-optimization program which includes automated exposure control, adjustment of the mA and/or kV according to patient size and/or use of iterative reconstruction technique. MEDICATIONS: The patient is currently admitted to the hospital and receiving intravenous antibiotics. The antibiotics were administered within an appropriate time frame prior to the initiation of the procedure. ANESTHESIA/SEDATION: Moderate (conscious) sedation was employed during this procedure. A total of Versed 1 mg and Fentanyl 25 mcg was administered intravenously by the radiology nurse. Total intra-service moderate Sedation Time: 12 minutes. The patient's level of consciousness and vital signs were monitored continuously by radiology nursing throughout the procedure under my direct supervision. COMPLICATIONS: None immediate. TECHNIQUE: Informed written consent was obtained from the patient after a thorough discussion of the procedural risks, benefits and alternatives. All questions were addressed. Maximal Sterile Barrier Technique was utilized including caps, mask, sterile gowns, sterile gloves, sterile drape, hand hygiene and skin antiseptic. A timeout was performed prior to the initiation of the procedure. PROCEDURE: The operative field was prepped with chlorhexidine in a sterile fashion, and a sterile drape was applied covering the operative field. A sterile gown and sterile gloves were used for the procedure. Local anesthesia was provided with 1% Lidocaine. CT imaging was obtained. The fluid and gas collection in the right lower quadrant was successfully identified. A suitable skin entry site was selected. Anesthesia was attained by infiltration with 1% lidocaine. A small dermatotomy was made. Under intermittent CT guidance an 18 gauge trocar needle was carefully  advanced from a right lower quadrant approach into the fluid and gas collection. A 0.035 wire was then coiled in the collection. The needle was removed. The tract was dilated to 64 [place]. A skater 12 French drain was advanced over the wire and formed. Aspiration yields approximately 20 mL of turbid opaque brownish colored fluid. This was sent for Gram stain and culture. The catheter was then lavaged and connected to JP bulb suction before being secured to the skin with 0 Prolene suture. Follow-up CT imaging demonstrates a well-positioned drainage catheter without evidence of complication. FINDINGS: Well-positioned drainage catheter with resolution of fluid collection. IMPRESSION: Successful 26F drain into the right lower quadrant fluid collection. Aspiration yields turbid opaque brown fluid which was sent for Gram stain and culture. Electronically Signed   By: Malachy Moan M.D.   On: 09/05/2022 13:46   CT ABDOMEN PELVIS W CONTRAST  Result Date: 09/04/2022 CLINICAL DATA:  Postoperative abdominal pain. Right hemicolectomy and ileostomy 13 days ago. EXAM: CT ABDOMEN AND PELVIS WITH CONTRAST TECHNIQUE: Multidetector CT imaging of the abdomen and pelvis was performed using the standard protocol following bolus administration of intravenous contrast. RADIATION DOSE REDUCTION: This exam was performed according to the departmental dose-optimization program which includes automated exposure control, adjustment of the mA and/or kV according to patient size and/or use of iterative reconstruction technique. CONTRAST:  75mL OMNIPAQUE IOHEXOL 350 MG/ML SOLN COMPARISON:  CT abdomen and pelvis 08/30/2022 FINDINGS: Lower chest: There are small bilateral pleural effusions and bibasilar atelectasis similar to the prior examination. Hepatobiliary: No focal liver abnormality is seen. No gallstones, gallbladder wall thickening, or biliary dilatation. Pancreas: Unremarkable. No pancreatic ductal dilatation or surrounding  inflammatory changes. Spleen: The spleen appears enlarged. In his nearly completely replaced with low-attenuation with some peripheral capsular enhancement. Appearance is similar to the prior study. There is no surrounding inflammatory stranding. Adrenals/Urinary Tract: The bilateral adrenal glands and left kidney are within normal limits. There is a new right-sided percutaneous nephrostomy  with distal catheter tip in the right renal pelvis. There is severe right-sided hydronephrosis with some layering hyperdensity in the calices. Hydronephrosis has minimally decreased when compared to the prior study. The right ureter remains dilated to the level of the pelvic inlet, mildly decreased from prior. The bladder appears within normal limits. Stomach/Bowel: Right-sided ostomy is again noted. Oral contrast reaches mid small bowel. Mid and proximal small bowel loops are dilated measuring up to 4 cm and contain air-fluid levels. The tip of an enteric tube is in the gastric antrum. There are nondilated small bowel loops in the right lower quadrant, although definitive transition point is not visualized. Vascular/Lymphatic: Aortic atherosclerosis. Thrombus in the splenic vein appears unchanged on image 3/26. No enlarged abdominal or pelvic lymph nodes. Reproductive: Uterus is enlarged containing calcified fibroids. The ovaries are not well delineated. Other: Again seen is a percutaneous drainage catheter entering the anterior left lower quadrant abdomen. The catheter traverses the pelvis with distal tip in the right lower quadrant. There is an ill-defined septated enhancing low-density collection in the pelvis abutting the uterus and sigmoid colon. Small foci of air are seen within the collection similar to the prior study. This area measures proximally 9.5 x 6.3 by 10.8 cm and appears similar to the prior examination. Enhancing fluid collection containing air in the right paracolic gutter measures 5.2 x 2.7 by 3.1 cm and  appears relatively stable in size. The percutaneous catheter does traverse this collection. Enhancing fluid collection containing a small amount of air abutting the fibroid uterus anteriorly measures 5.4 by 1.4 by 1.0 cm and has minimally decreased in size in the interval. Enhancing fluid collection in the pelvis abutting the rectosigmoid colon image 3/80 appears stable in size measuring 2.6 x 1.6 cm. Small amount of free fluid in the left lower quadrant and diffuse body wall edema appear unchanged. Musculoskeletal: Osseous structures appear stable. IMPRESSION: 1. Dilated small bowel loops with air-fluid levels worrisome for small bowel obstruction. Oral contrast reaches mid small bowel. 2. New right-sided percutaneous nephrostomy with distal catheter tip in the right renal pelvis. There is severe right-sided hydronephrosis which has minimally decreased from the prior study. Right ureter remains dilated to the level of the pelvic inlet, mildly decreased from the prior study. Layering hyperdensity in the right renal pelvis may represent bladder infection. 3. Stable percutaneous drainage catheters in the left lower quadrant and right lower quadrant. 4. Multiple complex fluid collections, some of which contain air, are again seen throughout the abdomen and pelvis. The larger collections are stable in size. Smaller collections have minimally decreased in size. 5. Stable small bilateral pleural effusions and bibasilar atelectasis. 6. Stable splenomegaly with near complete replacement of the spleen with low-attenuation and peripheral enhancement. Findings are worrisome for splenic infarct. 7. Stable splenic vein thrombosis. 8. Stable body wall edema. Aortic Atherosclerosis (ICD10-I70.0). Electronically Signed   By: Darliss Cheney M.D.   On: 09/04/2022 15:36    Scheduled Meds:  acetaminophen  650 mg Oral Q6H   arformoterol  15 mcg Nebulization BID   ascorbic acid  500 mg Oral Daily   atorvastatin  20 mg Oral Daily    budesonide (PULMICORT) nebulizer solution  0.5 mg Nebulization BID   Chlorhexidine Gluconate Cloth  6 each Topical Daily   diphenoxylate-atropine  1 tablet Oral BID   feeding supplement  237 mL Oral TID BM   ferrous sulfate  325 mg Oral BID WC   loperamide  2 mg Oral BID   melatonin  3 mg Oral QHS   metroNIDAZOLE  500 mg Oral Q12H   multivitamin with minerals  1 tablet Oral Daily   pantoprazole (PROTONIX) IV  40 mg Intravenous Q12H   polycarbophil  625 mg Per Tube BID   revefenacin  175 mcg Nebulization Daily   sodium bicarbonate  650 mg Oral TID   sodium chloride flush  10-40 mL Intracatheter Q12H   sodium chloride flush  5 mL Intracatheter Q8H   tamsulosin  0.4 mg Oral Daily   zinc sulfate  220 mg Oral Daily   Continuous Infusions:  sodium chloride 250 mL (09/04/22 2000)   ceFEPime (MAXIPIME) IV 2 g (09/06/22 1112)   heparin 1,750 Units/hr (09/06/22 0031)   micafungin (MYCAMINE) 100 mg in sodium chloride 0.9 % 100 mL IVPB 100 mg (09/06/22 0840)   promethazine (PHENERGAN) injection (IM or IVPB)       LOS: 19 days    Time spent: 35 mins    Willeen Niece, MD [facility]   If 7PM-7AM, please contact night-coverage

## 2022-09-06 NOTE — Progress Notes (Signed)
ANTICOAGULATION CONSULT NOTE - Follow-up Note  Pharmacy Consult for Heparin Indication:  pulmonary embolus with recent stroke on MRI on 7/10  Allergies  Allergen Reactions   Iodinated Contrast Media Shortness Of Breath and Itching    Resolved with benadryl and solu-mederol. Consider premedication prior to future studies.   Aspirin Other (See Comments)    Caused an asthma attack   Ibuprofen Swelling and Other (See Comments)    Angioedema and Asthma exacerbation   Sulfonamide Derivatives Swelling and Other (See Comments)    Face swelled SEVERELY   Penicillins Rash    Patient Measurements: Height: 5' 5.98" (167.6 cm) Weight:  (Bed malfunction) IBW/kg (Calculated) : 59.26 Heparin Dosing Weight: 75 kg  Vital Signs: Temp: 98 F (36.7 C) (07/21 0829) Temp Source: Oral (07/21 0829) BP: 155/57 (07/21 0829) Pulse Rate: 76 (07/21 0407)  Labs: Recent Labs    09/04/22 0405 09/04/22 0929 09/05/22 0037 09/05/22 2057 09/06/22 0103  HGB 8.5*  --  8.2*  --  7.6*  HCT 27.9*  --  27.4*  --  25.9*  PLT 1,254*  --  1,279*  --  1,315*  LABPROT  --   --  15.1  --   --   INR  --   --  1.2  --   --   HEPARINUNFRC  --    < > 0.33 0.37 0.37  CREATININE 0.93  --   --   --  1.18*   < > = values in this interval not displayed.    Estimated Creatinine Clearance: 46.8 mL/min (A) (by C-G formula based on SCr of 1.18 mg/dL (H)).   Medical History: Past Medical History:  Diagnosis Date   Anemia    Anxiety    Arthritis    Asthma    Blood transfusion without reported diagnosis    Cataract    removed bilateral   COPD (chronic obstructive pulmonary disease) (HCC)    Depression    GERD (gastroesophageal reflux disease)    Hyperlipidemia    Hypertension      Assessment: 72 yo W with a history of HTN, HLD, HF, thrombocytopenia, COPD, asthma found to have small subsegmental acute PE with acute and late acute CVA. No anticoagulation prior to admission. Pharmacy consulted for heparin.     Heparin continue to be therapeutic. Hgb 7.6. She is also on broad spectrum abx currently. Drain output is no longer feculent but purulent. Culture is still pending.   Scr 1.18, wbc 16.3  Goal of Therapy:  Heparin level 0.3-0.5 units/ml Monitor platelets by anticoagulation protocol: Yes   Plan:   Cont heparin infusion 1750 units/hr Monitor daily heparin level, CBC Monitor for signs/symptoms of bleeding  Change cefepime 2g IV q12  Ulyses Southward, PharmD, [place], AAHIVP, CPP Infectious Disease Pharmacist 09/06/2022 9:13 AM

## 2022-09-06 NOTE — Progress Notes (Signed)
15 Days Post-Op   Subjective/Chief Complaint: Pt with IR drain placement Doing well Tol PO   Objective: Vital signs in last 24 hours: Temp:  [97.5 F (36.4 C)-98.1 F (36.7 C)] 98 F (36.7 C) (07/21 0829) Pulse Rate:  [76-96] 76 (07/21 0407) Resp:  [12-22] 14 (07/21 0407) BP: (116-155)/(43-60) 155/57 (07/21 0829) SpO2:  [98 %-100 %] 100 % (07/21 0407) Last BM Date : 09/05/22  Intake/Output from previous day: 07/20 0701 - 07/21 0700 In: 60 [NG/GT:60] Out: 1340 [Urine:1300; Drains:40] Intake/Output this shift: No intake/output data recorded.  PE: Gen: Alert, NAD, pleasant Abd: Soft, mild distension, NT. Stoma and mucus fistula viable - ostomy appliance with brown output in bag.  Midline wound overall clean with some fibrinous tissue at the base with granulation tissue otherwise. JP drain with purulent drainagex 2 and new IR drain with sanginous (hematoma) output  Lab Results:  Recent Labs    09/05/22 0037 09/06/22 0103  WBC 20.3* 16.3*  HGB 8.2* 7.6*  HCT 27.4* 25.9*  PLT 1,279* 1,315*   BMET Recent Labs    09/04/22 0405 09/06/22 0103  NA 133* 134*  K 3.6 3.6  CL 111 113*  CO2 17* 15*  GLUCOSE 100* 126*  BUN 23 39*  CREATININE 0.93 1.18*  CALCIUM 8.0* 8.5*   PT/INR Recent Labs    09/05/22 0037  LABPROT 15.1  INR 1.2   ABG No results for input(s): "PHART", "HCO3" in the last 72 hours.  Invalid input(s): "PCO2", "PO2"  Studies/Results: CT GUIDED VISCERAL FLUID DRAIN BY PERC CATH  Result Date: 09/05/2022 INDICATION: Postoperative fluid collection concerning for abscess. EXAM: CT GUIDED DRAINAGE OF RIGHT LOWER QUADRANT ABSCESS TECHNIQUE: Multidetector CT imaging of the abdomen was performed following the standard protocol without IV contrast. RADIATION DOSE REDUCTION: This exam was performed according to the departmental dose-optimization program which includes automated exposure control, adjustment of the mA and/or kV according to patient size and/or  use of iterative reconstruction technique. MEDICATIONS: The patient is currently admitted to the hospital and receiving intravenous antibiotics. The antibiotics were administered within an appropriate time frame prior to the initiation of the procedure. ANESTHESIA/SEDATION: Moderate (conscious) sedation was employed during this procedure. A total of Versed 1 mg and Fentanyl 25 mcg was administered intravenously by the radiology nurse. Total intra-service moderate Sedation Time: 12 minutes. The patient's level of consciousness and vital signs were monitored continuously by radiology nursing throughout the procedure under my direct supervision. COMPLICATIONS: None immediate. TECHNIQUE: Informed written consent was obtained from the patient after a thorough discussion of the procedural risks, benefits and alternatives. All questions were addressed. Maximal Sterile Barrier Technique was utilized including caps, mask, sterile gowns, sterile gloves, sterile drape, hand hygiene and skin antiseptic. A timeout was performed prior to the initiation of the procedure. PROCEDURE: The operative field was prepped with chlorhexidine in a sterile fashion, and a sterile drape was applied covering the operative field. A sterile gown and sterile gloves were used for the procedure. Local anesthesia was provided with 1% Lidocaine. CT imaging was obtained. The fluid and gas collection in the right lower quadrant was successfully identified. A suitable skin entry site was selected. Anesthesia was attained by infiltration with 1% lidocaine. A small dermatotomy was made. Under intermittent CT guidance an 18 gauge trocar needle was carefully advanced from a right lower quadrant approach into the fluid and gas collection. A 0.035 wire was then coiled in the collection. The needle was removed. The tract was dilated to 12  [place]. A skater 12 French drain was advanced over the wire and formed. Aspiration yields approximately 20 mL of turbid  opaque brownish colored fluid. This was sent for Gram stain and culture. The catheter was then lavaged and connected to JP bulb suction before being secured to the skin with 0 Prolene suture. Follow-up CT imaging demonstrates a well-positioned drainage catheter without evidence of complication. FINDINGS: Well-positioned drainage catheter with resolution of fluid collection. IMPRESSION: Successful 37F drain into the right lower quadrant fluid collection. Aspiration yields turbid opaque brown fluid which was sent for Gram stain and culture. Electronically Signed   By: Malachy Moan M.D.   On: 09/05/2022 13:46   CT ABDOMEN PELVIS W CONTRAST  Result Date: 09/04/2022 CLINICAL DATA:  Postoperative abdominal pain. Right hemicolectomy and ileostomy 13 days ago. EXAM: CT ABDOMEN AND PELVIS WITH CONTRAST TECHNIQUE: Multidetector CT imaging of the abdomen and pelvis was performed using the standard protocol following bolus administration of intravenous contrast. RADIATION DOSE REDUCTION: This exam was performed according to the departmental dose-optimization program which includes automated exposure control, adjustment of the mA and/or kV according to patient size and/or use of iterative reconstruction technique. CONTRAST:  75mL OMNIPAQUE IOHEXOL 350 MG/ML SOLN COMPARISON:  CT abdomen and pelvis 08/30/2022 FINDINGS: Lower chest: There are small bilateral pleural effusions and bibasilar atelectasis similar to the prior examination. Hepatobiliary: No focal liver abnormality is seen. No gallstones, gallbladder wall thickening, or biliary dilatation. Pancreas: Unremarkable. No pancreatic ductal dilatation or surrounding inflammatory changes. Spleen: The spleen appears enlarged. In his nearly completely replaced with low-attenuation with some peripheral capsular enhancement. Appearance is similar to the prior study. There is no surrounding inflammatory stranding. Adrenals/Urinary Tract: The bilateral adrenal glands and left  kidney are within normal limits. There is a new right-sided percutaneous nephrostomy with distal catheter tip in the right renal pelvis. There is severe right-sided hydronephrosis with some layering hyperdensity in the calices. Hydronephrosis has minimally decreased when compared to the prior study. The right ureter remains dilated to the level of the pelvic inlet, mildly decreased from prior. The bladder appears within normal limits. Stomach/Bowel: Right-sided ostomy is again noted. Oral contrast reaches mid small bowel. Mid and proximal small bowel loops are dilated measuring up to 4 cm and contain air-fluid levels. The tip of an enteric tube is in the gastric antrum. There are nondilated small bowel loops in the right lower quadrant, although definitive transition point is not visualized. Vascular/Lymphatic: Aortic atherosclerosis. Thrombus in the splenic vein appears unchanged on image 3/26. No enlarged abdominal or pelvic lymph nodes. Reproductive: Uterus is enlarged containing calcified fibroids. The ovaries are not well delineated. Other: Again seen is a percutaneous drainage catheter entering the anterior left lower quadrant abdomen. The catheter traverses the pelvis with distal tip in the right lower quadrant. There is an ill-defined septated enhancing low-density collection in the pelvis abutting the uterus and sigmoid colon. Small foci of air are seen within the collection similar to the prior study. This area measures proximally 9.5 x 6.3 by 10.8 cm and appears similar to the prior examination. Enhancing fluid collection containing air in the right paracolic gutter measures 5.2 x 2.7 by 3.1 cm and appears relatively stable in size. The percutaneous catheter does traverse this collection. Enhancing fluid collection containing a small amount of air abutting the fibroid uterus anteriorly measures 5.4 by 1.4 by 1.0 cm and has minimally decreased in size in the interval. Enhancing fluid collection in the  pelvis abutting the rectosigmoid colon image  3/80 appears stable in size measuring 2.6 x 1.6 cm. Small amount of free fluid in the left lower quadrant and diffuse body wall edema appear unchanged. Musculoskeletal: Osseous structures appear stable. IMPRESSION: 1. Dilated small bowel loops with air-fluid levels worrisome for small bowel obstruction. Oral contrast reaches mid small bowel. 2. New right-sided percutaneous nephrostomy with distal catheter tip in the right renal pelvis. There is severe right-sided hydronephrosis which has minimally decreased from the prior study. Right ureter remains dilated to the level of the pelvic inlet, mildly decreased from the prior study. Layering hyperdensity in the right renal pelvis may represent bladder infection. 3. Stable percutaneous drainage catheters in the left lower quadrant and right lower quadrant. 4. Multiple complex fluid collections, some of which contain air, are again seen throughout the abdomen and pelvis. The larger collections are stable in size. Smaller collections have minimally decreased in size. 5. Stable small bilateral pleural effusions and bibasilar atelectasis. 6. Stable splenomegaly with near complete replacement of the spleen with low-attenuation and peripheral enhancement. Findings are worrisome for splenic infarct. 7. Stable splenic vein thrombosis. 8. Stable body wall edema. Aortic Atherosclerosis (ICD10-I70.0). Electronically Signed   By: Darliss Cheney M.D.   On: 09/04/2022 15:36    Anti-infectives: Anti-infectives (From admission, onward)    Start     Dose/Rate Route Frequency Ordered Stop   09/04/22 2200  metroNIDAZOLE (FLAGYL) tablet 500 mg        500 mg Oral Every 12 hours 09/04/22 0741     08/30/22 0830  ceFEPIme (MAXIPIME) 2 g in sodium chloride 0.9 % 100 mL IVPB        2 g 200 mL/hr over 30 Minutes Intravenous Every 8 hours 08/30/22 0826     08/23/22 2100  ceFEPIme (MAXIPIME) 2 g in sodium chloride 0.9 % 100 mL IVPB  Status:   Discontinued        2 g 200 mL/hr over 30 Minutes Intravenous Every 12 hours 08/23/22 1357 08/30/22 0826   08/23/22 0400  micafungin (MYCAMINE) 100 mg in sodium chloride 0.9 % 100 mL IVPB        100 mg 105 mL/hr over 1 Hours Intravenous Daily 08/23/22 0242     08/22/22 1800  metroNIDAZOLE (FLAGYL) IVPB 500 mg  Status:  Discontinued        500 mg 100 mL/hr over 60 Minutes Intravenous Every 12 hours 08/22/22 1711 09/04/22 0741   08/22/22 1800  ceFEPIme (MAXIPIME) 2 g in sodium chloride 0.9 % 100 mL IVPB  Status:  Discontinued        2 g 200 mL/hr over 30 Minutes Intravenous Every 8 hours 08/22/22 1716 08/23/22 1357   08/22/22 1724  metroNIDAZOLE (FLAGYL) 500 MG/100ML IVPB       Note to Pharmacy: [name]: cabinet override      08/22/22 1724 08/23/22 0845   08/19/22 1030  cefTRIAXone (ROCEPHIN) 1 g in sodium chloride 0.9 % 100 mL IVPB  Status:  Discontinued        1 g 200 mL/hr over 30 Minutes Intravenous Every 24 hours 08/19/22 0937 08/22/22 1715       Assessment/Plan: POD 15 s/p exploratory laparotomy, right hemicolectomy, ileostomy, mucus fistula, biopsy of pelvic mass by Dr. 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 Margins negative. 0/43 lymph nodes.  - CEA - 143, CA 125: 40 and CA 19-9: 50 - Has had CT CAP  since admission - Discussed case with attending - given mass is cancerous, the concern is that this was not resectable during her initial operation. If this progresses and causes another obstruction I think her options would be limited surgically. Dr. Mosetta Putt of Oncology has seen and felt her cancer is likely incurable and her overall prognosis is poor. They will plan to see her back in 4-6 weeks to discuss options for chemo. Palliative is following. Will f/u on their note and recs.    - High ileostomy output. Cont fiber, iron and immodium. Add  lomotil BID 7/19. Monitor output, Cr, electrolytes. Avoid sugary drinks.  - WOC for stoma teaching and assistance with pouching. - BID wet to dry to midline - CT 7/14 obtained for drain appearing feculent - this showed small volume fluid within the abdomen and pelvis with several collections demonstrating rim-enhancement suggestive of abscesses -  lower right pericolic gutter fluid collection measures 5.2 x 3.5 x 4.1 cm and collection along the anterior margin of the uterine fundus measures approximately 7.2 x 1.8 x 5.1 cm. Surgical drain is near both of these collections but does not necessarily course through them completely. IR consulted for drainage. They felt the intra-abdominal fluid collections are small and look like post-op blood. Cont abx for now.  WBC up today but clinically stable. May need repeat CT scan over the weekend if fever, worsening leukocytosis, clinical change etc.  - Cont JP drain - no longer feculent but is purulent     FEN: OK to resume normal diet and DC TFs.  Ok to DC DHT VTE: SCDs, Heparin gtt. Would hold on changing to DOAC for now.  ID: Cefepime/flagyl/micafungin Foley: Removed, Monitor. Urology following. S/p R PCN Dispo - As above   Per [facility] New stroke - Seen on MRI 7/10, suspected thromboembolic, defer to primary team/stroke team for further w/u and management.  PE - heparin drip per primary.  HTN HLD GERD COPD R hydro - s/p R PCN CT 7/14 also suggestion of splenic infarct and bilateral pleural effusions  LOS: 19 days    Debra Sharp 09/06/2022

## 2022-09-07 DIAGNOSIS — K922 Gastrointestinal hemorrhage, unspecified: Secondary | ICD-10-CM | POA: Diagnosis not present

## 2022-09-07 LAB — BASIC METABOLIC PANEL
Anion gap: 8 (ref 5–15)
BUN: 43 mg/dL — ABNORMAL HIGH (ref 8–23)
CO2: 14 mmol/L — ABNORMAL LOW (ref 22–32)
Calcium: 8.9 mg/dL (ref 8.9–10.3)
Chloride: 112 mmol/L — ABNORMAL HIGH (ref 98–111)
Creatinine, Ser: 1.42 mg/dL — ABNORMAL HIGH (ref 0.44–1.00)
GFR, Estimated: 40 mL/min — ABNORMAL LOW (ref >60)
Glucose, Bld: 134 mg/dL — ABNORMAL HIGH (ref 70–99)
Potassium: 4.5 mmol/L (ref 3.5–5.1)
Sodium: 134 mmol/L — ABNORMAL LOW (ref 135–145)

## 2022-09-07 LAB — GLUCOSE, CAPILLARY
Glucose-Capillary: 122 mg/dL — ABNORMAL HIGH (ref 70–99)
Glucose-Capillary: 102 mg/dL — ABNORMAL HIGH (ref 70–99)

## 2022-09-07 LAB — AEROBIC/ANAEROBIC CULTURE W GRAM STAIN (SURGICAL/DEEP WOUND)

## 2022-09-07 LAB — CBC
HCT: 28.8 % — ABNORMAL LOW (ref 36.0–46.0)
Hemoglobin: 8 g/dL — ABNORMAL LOW (ref 12.0–15.0)
MCH: 24.2 pg — ABNORMAL LOW (ref 26.0–34.0)
MCHC: 27.8 g/dL — ABNORMAL LOW (ref 30.0–36.0)
MCV: 87 fL (ref 80.0–100.0)
Platelets: 1239 10*3/uL (ref 150–400)
RBC: 3.31 MIL/uL — ABNORMAL LOW (ref 3.87–5.11)
RDW: 27.2 % — ABNORMAL HIGH (ref 11.5–15.5)
WBC: 15.4 10*3/uL — ABNORMAL HIGH (ref 4.0–10.5)
nRBC: 0 % (ref 0.0–0.2)

## 2022-09-07 LAB — HEPARIN LEVEL (UNFRACTIONATED)
Heparin Unfractionated: 0.69 IU/mL (ref 0.30–0.70)
Heparin Unfractionated: 0.27 IU/mL — ABNORMAL LOW (ref 0.30–0.70)

## 2022-09-07 MED ORDER — HEPARIN (PORCINE) 25000 UT/250ML-% IV SOLN
1700.0000 [IU]/h | INTRAVENOUS | Status: DC
  Administered 2022-09-07: 1700 [IU]/h via INTRAVENOUS
  Filled 2022-09-07: qty 250

## 2022-09-07 MED ORDER — CALCIUM POLYCARBOPHIL 625 MG PO TABS
625.0000 mg | ORAL_TABLET | Freq: Two times a day (BID) | ORAL | Status: DC
  Administered 2022-09-07 – 2022-09-12 (×6): 625 mg via ORAL
  Filled 2022-09-07 (×16): qty 1

## 2022-09-07 MED ORDER — LOPERAMIDE HCL 2 MG PO CAPS
2.0000 mg | ORAL_CAPSULE | Freq: Four times a day (QID) | ORAL | Status: DC
  Administered 2022-09-07 (×2): 2 mg via ORAL
  Filled 2022-09-07 (×3): qty 1

## 2022-09-07 NOTE — Progress Notes (Addendum)
PROGRESS NOTE    Debra Sharp  ZOX:[id number] DOB: 1950-06-08 DOA: 08/18/2022 PCP: Clayborn Heron, MD   Brief Narrative:  This 72 year old female with past medical history of hypertension, hyperlipidemia, systolic CHF, thrombocytopenia, COPD, asthma who initially presented with weakness, abdominal pain. Workup showed anemia concerning for upper GI bleed. CT abdomen/pelvis showed colonic distention, possible obstruction. GI consulted, plan was for EGD but she could not tolerate prep. Also found to have right-sided hydronephrosis, incidental acute stroke on CT/MRI. Repeat CT abdomen/pelvis on 7/6 showed new intraperitoneal free air with presumed perforation of the colon. Surgery consulted and she underwent emergent exploratory laparotomy, right hemicolectomy, ileostomy with fistula, biopsy of pelvic mass. Patient was left intubated and was transferred to ICU for pain management. 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 Palliative care consulted. General surgery following. Currently on tube feeding. Hospital course also remarkable for finding of acute PE now on heparin drip. Now extubated and transferred to [facility] service on 7/12. Plan was for TEE which has been canceled because patient declined. Palliative care, urology ,surgery ,neurology were following.     Currently titrating tube feeds.  Surgery following. Patient underwent new percutaneous drain in the right lower quadrant.  Assessment & Plan:   Principal Problem:   GI bleed Active Problems:   Symptomatic anemia   Thrombocytosis   Iron deficiency anemia   Abnormal CT of the abdomen   Colon distention   History of cardioembolic cerebrovascular accident (CVA)   Silent cerebral infarction (HCC)   Bowel perforation (HCC)   Ventilator dependent (HCC)   Pelvic mass   Change in bowel habits   Abnormal CT scan, gastrointestinal tract   Arterial hypotension   Septic shock (HCC)   Fecal occult blood test positive   Uterine  leiomyoma   Hydroureteronephrosis   Sepsis with acute hypoxic respiratory failure and septic shock (HCC)   Encephalopathy acute   Perforation bowel (HCC)   Malnutrition of moderate degree   Cerebrovascular accident (CVA) due to embolism of precerebral artery (HCC)   Acute metabolic encephalopathy   Delirium   Adenocarcinoma of small bowel (HCC)  Septic shock secondary to acute peritonitis: Colonic perforation due to obstructing pelvic mass:  Status post exploratory laparotomy with right hemicolectomy, ileostomy creation, mucus fistual formation, biopsy of pelvic mass, and intraoperative of vasculature using indocyanine green.   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   Currently on broad spectrum anti-infectives with micafungin, Flagyl, cefepime.  culture NGTD.   On tube feeds, will continue for now until she's able to maintain adequate PO intake by mouth. RD c/s for tube feeds.  General surgery following, appreciate recs.   IR had been consulted for drainage of intraabdominal fluid collections, but they thought intraabdominal fluid collections are small and appeared c/w post op blood Repeat CT scan being considered by surgery if worsening fever, leukocytosis, etc Leukocytosis persist.  CT 7/13 with small volume fluid within abdomen and pelvis with several collections demonstrating rim enhancement suggestive of abscesses, new, near complete hypoattenuation of splenic parenchyma with subcapsular low density fluid along peripheral margin of spleen, multiple mildly dilated smal bowel loops with numerous air fluid levels (post op ileus), severe R hydroureteronephrosis, new mild left hydronephrosis.  Small bilateral effusions.  Large complex mass occupying majority of low right pelvis.  General surgery recommended Repeat CT A/P due to persistent leucocytosis which showed new collection. Patient has new PCT drain.   Newly Diagnosed Terminal Ileum Adenocarcinoma with  Peritoneal Metastasis:  Culprit  for colonic perforation.  Initially concern for gynecological malignancy with elevated CA125, CA 19-19 but pathology showed small bowel primary.  Appreciate Dr. Latanya Maudlin recommendations.  Possible chemo after she recovers in 1-2 months.  ? Gyn follow up for debulking surgery of pelvic mass.  Overall prognosis is poor.  Recommending indefinite anticoagulation.   Chronic systolic CHF:  Echo on 08/18/2020 showed EF of 40 to 45%, global hypokinesis.  Previous echo on March 2024 showed EF of 30 to 35%.  We recommend follow-up with cardiology as an outpatient. She has bilateral LE edema - follow BNP Trial low dose lasix and follow.   Acute metabolic encephalopathy:  > Resolved. Likely from sepsis, delirium, CVAs.  This morning she remains alert oriented.  Confusion is intermittent.  -repeat CT brain neg for acute findings She is much alert, following commands.    Acute CVA: MRI done on 7/10 with acute and late acute infarcts in multiple vascular territories consistent with central embolic source.   Appreciate neurology recs -> bilateral anterior/posterior infarcts with pattern more concerning for hypotension in setting of severe anemia and septic shock with hypotension (ddx includes hypercoagulable state with suspected malignancy).  Recommending transition to PO anticoagulation per primary team.  Atorvastatin 20 mg.  EEG done on 7/9 did not show any seizure.  There was plan for TEE but patient has declined. .  Recommended SNF on discharge. She has left-sided weakness. Hardly  able to get up from the bed.  Currently on heparin drip, will change to Eliquis when she does not need any more surgery and is able to tolerate po intake   Acute PE: CT angiogram showed acute PE.  Currently on heparin drip.  No evidence of right heart strain.  Will start her on Eliquis when surgery clears   AKI/hyperkalemia/hypophosphatemia:   Currently kidney function are stable. Phosphorus being  supplemented.   Hydronephrosis secondary to abdominal mass: Urology following.  Follow-up CT showed persistent severe right hydroureteronephrosis.  IR did  PCNL placement on 7/15 (pulled overnight 7/15) -urology saw pt 7/16--and still flushes easily. -mild left hydro will need to be followed outpatient.   Acute on chronic anemia of renal disease /acute blood loss anemia secondary to surgery/chronic GI bleed due to small bowel adenocarcinoma:  Monitor hemoglobin.  Transfuse if less than 7.  Currently on PPI. Relatively stable.    Hypertension: Off hypertensives.  Monitor blood pressure   Hyperlipidemia: Currently on Lipitor   Hyperglycemia: A1c 5.4, d/c SSI Currently on sliding scale insulin.     Left upper extremity edema:  Negative for DVT  Thrombocytosis: Likely secondary to above. Patient is already on IV heparin.   Goals of care: Multiple comorbidities.  Palliative care were following.  She's currently full code/full scope of care.   DVT prophylaxis: Heparin gtt Code Status: Full code Family Communication: No family at bed side. Disposition Plan:   Status is: Inpatient Remains inpatient appropriate because:  Need for continued inpatient care.    Consultants:  Surgery Urology palliative  Procedures:  Echo EEG LUE duplex  Antimicrobials:  Anti-infectives (From admission, onward)    Start     Dose/Rate Route Frequency Ordered Stop   09/06/22 1200  ceFEPIme (MAXIPIME) 2 g in sodium chloride 0.9 % 100 mL IVPB        2 g 200 mL/hr over 30 Minutes Intravenous Every 12 hours 09/06/22 0920     09/04/22 2200  metroNIDAZOLE (FLAGYL) tablet 500 mg        500  mg Oral Every 12 hours 09/04/22 0741     08/30/22 0830  ceFEPIme (MAXIPIME) 2 g in sodium chloride 0.9 % 100 mL IVPB  Status:  Discontinued        2 g 200 mL/hr over 30 Minutes Intravenous Every 8 hours 08/30/22 0826 09/06/22 0920   08/23/22 2100  ceFEPIme (MAXIPIME) 2 g in sodium chloride 0.9 % 100 mL IVPB  Status:   Discontinued        2 g 200 mL/hr over 30 Minutes Intravenous Every 12 hours 08/23/22 1357 08/30/22 0826   08/23/22 0400  micafungin (MYCAMINE) 100 mg in sodium chloride 0.9 % 100 mL IVPB        100 mg 105 mL/hr over 1 Hours Intravenous Daily 08/23/22 0242     08/22/22 1800  metroNIDAZOLE (FLAGYL) IVPB 500 mg  Status:  Discontinued        500 mg 100 mL/hr over 60 Minutes Intravenous Every 12 hours 08/22/22 1711 09/04/22 0741   08/22/22 1800  ceFEPIme (MAXIPIME) 2 g in sodium chloride 0.9 % 100 mL IVPB  Status:  Discontinued        2 g 200 mL/hr over 30 Minutes Intravenous Every 8 hours 08/22/22 1716 08/23/22 1357   08/22/22 1724  metroNIDAZOLE (FLAGYL) 500 MG/100ML IVPB       Note to Pharmacy: [name]: cabinet override      08/22/22 1724 08/23/22 0845   08/19/22 1030  cefTRIAXone (ROCEPHIN) 1 g in sodium chloride 0.9 % 100 mL IVPB  Status:  Discontinued        1 g 200 mL/hr over 30 Minutes Intravenous Every 24 hours 08/19/22 0937 08/22/22 1715       Subjective: Patient was seen and examined at bed side, overnight events noted,  She underwent a percutaneous drain in RLQ yesterday showing bloody drainage.. She has colostomy with brownish stool noted.  Patient reports pain is reasonably controlled.  Objective: Vitals:   09/06/22 2331 09/07/22 0301 09/07/22 0739 09/07/22 0805  BP: (!) 124/56 (!) 147/55 (!) 138/58   Pulse: 100 76    Resp: 16 12 16    Temp: 97.9 F (36.6 C) 98.4 F (36.9 C) 97.8 F (36.6 C)   TempSrc: Oral Oral Oral   SpO2: 100% 100%  100%  Weight:  78 kg    Height:        Intake/Output Summary (Last 24 hours) at 09/07/2022 1136 Last data filed at 09/07/2022 0541 Gross per 24 hour  Intake 410 ml  Output 3550 ml  Net -3140 ml   Filed Weights   09/07/22 0301  Weight: 78 kg    Examination:  General exam: Appears calm and comfortable, deconditioned, not in any acute distress. Respiratory system: CTA bilaterally. Respiratory effort normal. RR  15 Cardiovascular system: S1 & S2 heard, RRR. No JVD, murmurs, rubs, gallops or clicks. No pedal edema. Gastrointestinal system: Abdomen is non distended, soft and mildly tender. Colostomy noted with brown colored stool in bag. Normal bowel sounds heard. Central nervous system: Alert and oriented. No focal neurological deficits. Extremities: Symmetric 5 x 5 power. Skin: No rashes, lesions or ulcers Psychiatry: Judgement and insight appear normal. Mood & affect appropriate.     Data Reviewed: I have personally reviewed following labs and imaging studies  CBC: Recent Labs  Lab 09/03/22 0729 09/04/22 0405 09/05/22 0037 09/06/22 0103 09/07/22 0234  WBC 17.5* 17.1* 20.3* 16.3* 15.4*  HGB 8.8* 8.5* 8.2* 7.6* 8.0*  HCT 29.2* 27.9* 27.4* 25.9*  28.8*  MCV 82.0 82.3 81.5 82.7 87.0  PLT 1,147* 1,254* 1,279* 1,315* 1,239*   Basic Metabolic Panel: Recent Labs  Lab 09/01/22 0319 09/02/22 0434 09/03/22 0729 09/04/22 0405 09/06/22 0103 09/07/22 0234  NA 136 134* 133* 133* 134* 134*  K 4.0 3.8 3.6 3.6 3.6 4.5  CL 111 111 110 111 113* 112*  CO2 17* 18* 15* 17* 15* 14*  GLUCOSE 92 110* 110* 100* 126* 134*  BUN 20 19 21 23  39* 43*  CREATININE 0.66 0.78 0.78 0.93 1.18* 1.42*  CALCIUM 8.0* 7.7* 7.7* 8.0* 8.5* 8.9  MG  --   --  1.8 1.7 1.7  --   PHOS 3.8  --  4.1 4.2 3.5  --    GFR: Estimated Creatinine Clearance: 38.9 mL/min (A) (by C-G formula based on SCr of 1.42 mg/dL (H)). Liver Function Tests: Recent Labs  Lab 09/04/22 0405  AST 18  ALT 11  ALKPHOS 71  BILITOT 0.1*  PROT 4.6*  ALBUMIN 1.5*   No results for input(s): "LIPASE", "AMYLASE" in the last 168 hours. No results for input(s): "AMMONIA" in the last 168 hours. Coagulation Profile: Recent Labs  Lab 09/05/22 0037  INR 1.2   Cardiac Enzymes: No results for input(s): "CKTOTAL", "CKMB", "CKMBINDEX", "TROPONINI" in the last 168 hours. BNP (last 3 results) No results for input(s): "PROBNP" in the last 8760  hours. HbA1C: No results for input(s): "HGBA1C" in the last 72 hours. CBG: Recent Labs  Lab 09/06/22 1636 09/06/22 2014 09/06/22 2356 09/07/22 0425 09/07/22 0741  GLUCAP 94 106* 108* 122* 102*   Lipid Profile: No results for input(s): "CHOL", "HDL", "LDLCALC", "TRIG", "CHOLHDL", "LDLDIRECT" in the last 72 hours. Thyroid Function Tests: No results for input(s): "TSH", "T4TOTAL", "FREET4", "T3FREE", "THYROIDAB" in the last 72 hours. Anemia Panel: No results for input(s): "VITAMINB12", "FOLATE", "FERRITIN", "TIBC", "IRON", "RETICCTPCT" in the last 72 hours.  Sepsis Labs: No results for input(s): "PROCALCITON", "LATICACIDVEN" in the last 168 hours.  Recent Results (from the past 240 hour(s))  Body fluid culture w Gram Stain     Status: None   Collection Time: 08/31/22  5:03 PM   Specimen: Urine, Random; Body Fluid  Result Value Ref Range Status   Specimen Description URINE, RANDOM  Final   Special Requests Normal  Final   Gram Stain   Final    WBC PRESENT, PREDOMINANTLY MONONUCLEAR NO ORGANISMS SEEN CYTOSPIN SMEAR    Culture   Final    NO GROWTH 3 DAYS Performed at [facility], [address], [place], [place] [address]    Report Status 09/03/2022 FINAL  Final  Aerobic/Anaerobic Culture w Gram Stain (surgical/deep wound)     Status: None (Preliminary result)   Collection Time: 09/05/22 12:14 PM   Specimen: Abscess  Result Value Ref Range Status   Specimen Description ABSCESS  Final   Special Requests NONE  Final   Gram Stain   Final    ABUNDANT WBC PRESENT, PREDOMINANTLY PMN ABUNDANT GRAM POSITIVE RODS FEW GRAM NEGATIVE RODS RARE GRAM POSITIVE COCCI    Culture   Final    CULTURE REINCUBATED FOR BETTER GROWTH Performed at [facility], [address] [place], [place] [address]    Report Status PENDING  Incomplete   Radiology Studies: CT GUIDED VISCERAL FLUID DRAIN BY PERC CATH  Result Date: 09/05/2022 INDICATION: Postoperative fluid collection  concerning for abscess. EXAM: CT GUIDED DRAINAGE OF RIGHT LOWER QUADRANT ABSCESS TECHNIQUE: Multidetector CT imaging of the abdomen  was performed following the standard protocol without IV contrast. RADIATION DOSE REDUCTION: This exam was performed according to the departmental dose-optimization program which includes automated exposure control, adjustment of the mA and/or kV according to patient size and/or use of iterative reconstruction technique. MEDICATIONS: The patient is currently admitted to the hospital and receiving intravenous antibiotics. The antibiotics were administered within an appropriate time frame prior to the initiation of the procedure. ANESTHESIA/SEDATION: Moderate (conscious) sedation was employed during this procedure. A total of Versed 1 mg and Fentanyl 25 mcg was administered intravenously by the radiology nurse. Total intra-service moderate Sedation Time: 12 minutes. The patient's level of consciousness and vital signs were monitored continuously by radiology nursing throughout the procedure under my direct supervision. COMPLICATIONS: None immediate. TECHNIQUE: Informed written consent was obtained from the patient after a thorough discussion of the procedural risks, benefits and alternatives. All questions were addressed. Maximal Sterile Barrier Technique was utilized including caps, mask, sterile gowns, sterile gloves, sterile drape, hand hygiene and skin antiseptic. A timeout was performed prior to the initiation of the procedure. PROCEDURE: The operative field was prepped with chlorhexidine in a sterile fashion, and a sterile drape was applied covering the operative field. A sterile gown and sterile gloves were used for the procedure. Local anesthesia was provided with 1% Lidocaine. CT imaging was obtained. The fluid and gas collection in the right lower quadrant was successfully identified. A suitable skin entry site was selected. Anesthesia was attained by infiltration with 1%  lidocaine. A small dermatotomy was made. Under intermittent CT guidance an 18 gauge trocar needle was carefully advanced from a right lower quadrant approach into the fluid and gas collection. A 0.035 wire was then coiled in the collection. The needle was removed. The tract was dilated to 33 [place]. A skater 12 French drain was advanced over the wire and formed. Aspiration yields approximately 20 mL of turbid opaque brownish colored fluid. This was sent for Gram stain and culture. The catheter was then lavaged and connected to JP bulb suction before being secured to the skin with 0 Prolene suture. Follow-up CT imaging demonstrates a well-positioned drainage catheter without evidence of complication. FINDINGS: Well-positioned drainage catheter with resolution of fluid collection. IMPRESSION: Successful 25F drain into the right lower quadrant fluid collection. Aspiration yields turbid opaque brown fluid which was sent for Gram stain and culture. Electronically Signed   By: Malachy Moan M.D.   On: 09/05/2022 13:46    Scheduled Meds:  acetaminophen  650 mg Oral Q6H   arformoterol  15 mcg Nebulization BID   ascorbic acid  500 mg Oral Daily   atorvastatin  20 mg Oral Daily   budesonide (PULMICORT) nebulizer solution  0.5 mg Nebulization BID   Chlorhexidine Gluconate Cloth  6 each Topical Daily   diphenoxylate-atropine  1 tablet Oral BID   feeding supplement  237 mL Oral TID BM   ferrous sulfate  325 mg Oral BID WC   loperamide  2 mg Oral QID   melatonin  3 mg Oral QHS   metroNIDAZOLE  500 mg Oral Q12H   multivitamin with minerals  1 tablet Oral Daily   pantoprazole (PROTONIX) IV  40 mg Intravenous Q12H   polycarbophil  625 mg Oral BID   revefenacin  175 mcg Nebulization Daily   sertraline  150 mg Oral Daily   sodium bicarbonate  650 mg Oral TID   sodium chloride flush  10-40 mL Intracatheter Q12H   sodium chloride flush  5 mL Intracatheter  Q8H   tamsulosin  0.4 mg Oral Daily   zinc sulfate   220 mg Oral Daily   Continuous Infusions:  sodium chloride 250 mL (09/04/22 2000)   ceFEPime (MAXIPIME) IV 2 g (09/06/22 2308)   heparin 1,650 Units/hr (09/07/22 0722)   micafungin (MYCAMINE) 100 mg in sodium chloride 0.9 % 100 mL IVPB 100 mg (09/07/22 0853)   promethazine (PHENERGAN) injection (IM or IVPB)       LOS: 20 days    Time spent: 35 mins    Willeen Niece, MD [facility]   If 7PM-7AM, please contact night-coverage

## 2022-09-07 NOTE — Plan of Care (Signed)
  Problem: Health Behavior/Discharge Planning: Goal: Ability to manage health-related needs will improve Outcome: Progressing   Problem: Clinical Measurements: Goal: Ability to maintain clinical measurements within normal limits will improve Outcome: Progressing Goal: Will remain free from infection Outcome: Progressing Goal: Diagnostic test results will improve Outcome: Progressing Goal: Cardiovascular complication will be avoided Outcome: Progressing   Problem: Activity: Goal: Risk for activity intolerance will decrease Outcome: Progressing   Problem: Nutrition: Goal: Adequate nutrition will be maintained Outcome: Progressing

## 2022-09-07 NOTE — Consult Note (Addendum)
WOC Nurse ostomy follow-up consult note; ileostomy and mucus fistula performed 08/22/2022  Stoma type/location:  RMQ ileostomy, RLQ mucus fistula  Stomal assessment/size:  1 3/4" ileostomy, red moist, slightly oval; mucus fistula 1 3/8" round, red moist; both above skin level but located in close proximity with narrow intact bridge of skin in between, making it difficult to apply 2 separate pouches.    Peristomal assessment: intact peristomal skin; patient with a few dark colored skin tags near superior portion of stoma, bleed slightly when cleansed. Output: Mod amt dark brown liquid stool in bedside drainage bag. Ostomy pouching: Medium Kathrine Cords  [place] [phone]) pouch placed to cover both ileostomy and fistula; pattern left in room. Applied piece of barrier in between stomas over exposed skin. Instructions provided for bedside nurses to perform pouch change PRN if leaking. Pt did not watch the procedure or ask questions and does not appear aware of ostomy pouch. No family present.  Pt will need total assistance with pouch application and emptying after discharge. Disposition unknown at this time. Ordered supplies for staff nurse's use to the room. Enrolled patient in [place] Secure Start DC program: NOT YET.  WOC team will assess weekly. Thank-you,  Cammie Mcgee MSN, RN, CWOCN, [place], CNS [phone]

## 2022-09-07 NOTE — Progress Notes (Signed)
ANTICOAGULATION CONSULT NOTE - Follow-up Note  Pharmacy Consult for Heparin Indication:  pulmonary embolus with recent stroke on MRI on 7/10  Allergies  Allergen Reactions   Iodinated Contrast Media Shortness Of Breath and Itching    Resolved with benadryl and solu-mederol. Consider premedication prior to future studies.   Aspirin Other (See Comments)    Caused an asthma attack   Ibuprofen Swelling and Other (See Comments)    Angioedema and Asthma exacerbation   Sulfonamide Derivatives Swelling and Other (See Comments)    Face swelled SEVERELY   Penicillins Rash    Patient Measurements: Height: 5' 5.98" (167.6 cm) Weight: 78 kg (171 lb 15.3 oz) IBW/kg (Calculated) : 59.26 Heparin Dosing Weight: 75 kg  Vital Signs: Temp: 98 F (36.7 C) (07/22 1632) Temp Source: Oral (07/22 1632) BP: 139/60 (07/22 1632) Pulse Rate: 94 (07/22 1632)  Labs: Recent Labs    09/05/22 0037 09/05/22 2057 09/06/22 0103 09/07/22 0234 09/07/22 1600  HGB 8.2*  --  7.6* 8.0*  --   HCT 27.4*  --  25.9* 28.8*  --   PLT 1,279*  --  1,315* 1,239*  --   LABPROT 15.1  --   --   --   --   INR 1.2  --   --   --   --   HEPARINUNFRC 0.33   < > 0.37 0.69 0.27*  CREATININE  --   --  1.18* 1.42*  --    < > = values in this interval not displayed.    Estimated Creatinine Clearance: 38.9 mL/min (A) (by C-G formula based on SCr of 1.42 mg/dL (H)).   Medical History: Past Medical History:  Diagnosis Date   Anemia    Anxiety    Arthritis    Asthma    Blood transfusion without reported diagnosis    Cataract    removed bilateral   COPD (chronic obstructive pulmonary disease) (HCC)    Depression    GERD (gastroesophageal reflux disease)    Hyperlipidemia    Hypertension      Assessment: 72 yo W with a history of HTN, HLD, HF, thrombocytopenia, COPD, asthma found to have small subsegmental acute PE with acute and late acute CVA. No anticoagulation prior to admission. Pharmacy consulted for heparin.     Heparin level 0.27 @ 1650 units/hr. Level is subtherapeutic. Per discussion with day shift RN, Heparin is infusing via PICC. The line is flushed before heparin level is drawn. Hgb wnl. No bleeding reported.   Goal of Therapy:  Heparin level 0.3-0.5 units/ml Monitor platelets by anticoagulation protocol: Yes   Plan:   Increase heparin infusion slightly to 1700 units/hr Check 8 hr HL via peripheral stick Monitor daily heparin level, CBC Monitor for signs/symptoms of bleeding   Verdene Rio, PharmD PGY1 Pharmacy Resident

## 2022-09-07 NOTE — Progress Notes (Signed)
ANTICOAGULATION CONSULT NOTE - Follow-up Note  Pharmacy Consult for Heparin Indication:  pulmonary embolus with recent stroke on MRI on 7/10  Allergies  Allergen Reactions   Iodinated Contrast Media Shortness Of Breath and Itching    Resolved with benadryl and solu-mederol. Consider premedication prior to future studies.   Aspirin Other (See Comments)    Caused an asthma attack   Ibuprofen Swelling and Other (See Comments)    Angioedema and Asthma exacerbation   Sulfonamide Derivatives Swelling and Other (See Comments)    Face swelled SEVERELY   Penicillins Rash    Patient Measurements: Height: 5' 5.98" (167.6 cm) Weight: 78 kg (171 lb 15.3 oz) IBW/kg (Calculated) : 59.26 Heparin Dosing Weight: 75 kg  Vital Signs: Temp: 98.4 F (36.9 C) (07/22 0301) Temp Source: Oral (07/22 0301) BP: 147/55 (07/22 0301) Pulse Rate: 76 (07/22 0301)  Labs: Recent Labs    09/05/22 0037 09/05/22 2057 09/06/22 0103 09/07/22 0234  HGB 8.2*  --  7.6* 8.0*  HCT 27.4*  --  25.9* 28.8*  PLT 1,279*  --  1,315* 1,239*  LABPROT 15.1  --   --   --   INR 1.2  --   --   --   HEPARINUNFRC 0.33 0.37 0.37 0.69  CREATININE  --   --  1.18* 1.42*    Estimated Creatinine Clearance: 38.9 mL/min (A) (by C-G formula based on SCr of 1.42 mg/dL (H)).   Medical History: Past Medical History:  Diagnosis Date   Anemia    Anxiety    Arthritis    Asthma    Blood transfusion without reported diagnosis    Cataract    removed bilateral   COPD (chronic obstructive pulmonary disease) (HCC)    Depression    GERD (gastroesophageal reflux disease)    Hyperlipidemia    Hypertension      Assessment: 72 yo Sharp with a history of HTN, HLD, HF, thrombocytopenia, COPD, asthma found to have small subsegmental acute PE with acute and late acute CVA. No anticoagulation prior to admission. Pharmacy consulted for heparin.    Heparin level came back above the modified goal today. Hgb 8. She is also on broad spectrum  abx currently. Drain output is no longer feculent but purulent. Culture is still pending.   Scr up to 1.42, wbc 12.5  Goal of Therapy:  Heparin level 0.3-0.5 units/ml Monitor platelets by anticoagulation protocol: Yes   Plan:   Decrease heparin infusion 1650 units/hr Check 8 hr HL Monitor daily heparin level, CBC Monitor for signs/symptoms of bleeding  Change cefepime 2g IV q12  Ulyses Southward, PharmD, [place], AAHIVP, CPP Infectious Disease Pharmacist 09/07/2022 7:19 AM

## 2022-09-07 NOTE — Progress Notes (Signed)
Referring Physician(s): Axel Filler, MD  Supervising Physician: Gilmer Mor  Patient Status:  Debra Sharp - In-pt  Chief Complaint:  S/p PCN 7/15 for right hydronephrosis S/p RLQ drain 09/05/22   Subjective:  Patient lying comfortably in bed at time of exam.  Allergies: Iodinated contrast media, Aspirin, Ibuprofen, Sulfonamide derivatives, and Penicillins  Medications: Prior to Admission medications   Medication Sig Start Date End Date Taking? Authorizing Provider  cetirizine (ZYRTEC) 10 MG tablet Take 1 tablet (10 mg total) by mouth daily. Patient taking differently: Take 10 mg by mouth daily as needed for allergies or rhinitis. 10/09/19  Yes Hawks, Christy A, FNP  ferrous sulfate 325 (65 FE) MG EC tablet Take 325 mg by mouth daily.   Yes [provider]  fluticasone (FLONASE) 50 MCG/ACT nasal spray Place 2 sprays into both nostrils daily. Patient taking differently: Place 2 sprays into both nostrils at bedtime as needed for allergies or rhinitis. 10/09/19  Yes Hawks, Christy A, FNP  lisinopril (ZESTRIL) 40 MG tablet Take 40 mg by mouth in Debra morning.   Yes [provider]  lovastatin (MEVACOR) 20 MG tablet Take 20 mg by mouth at bedtime.   Yes [provider]  PROAIR HFA 108 (90 Base) MCG/ACT inhaler Inhale 2 puffs into Debra lungs every 6 (six) hours as needed for wheezing or shortness of breath.   Yes [provider]  sertraline (ZOLOFT) 100 MG tablet Take 150 mg by mouth in Debra morning.   Yes [provider]  SYMBICORT 160-4.5 MCG/ACT inhaler Inhale 2 puffs into Debra lungs in Debra morning and at bedtime.   Yes [provider]     Vital Signs: BP 139/60 (BP Location: Right Leg)   Pulse 94   Temp 98 F (36.7 C) (Oral)   Resp 14   Ht 5' 5.98" (1.676 m)   Wt 171 lb 15.3 oz (78 kg)   SpO2 100%   BMI 27.77 kg/m   Physical Exam Vitals reviewed.  Constitutional:      General: She is not in acute distress. Pulmonary:      Effort: Pulmonary effort is normal.  Abdominal:     Comments: Right nephrostomy drain with suture in place. No bleeding around nephrostomy site noted, but ~150 mL of bloody urine in bag at time of exam. RLQ abdominal drain with minimal bleeding around drain site at time of exam today, ~15 mL of purulent fluid in bulb at time of exam. Suture intact.  Neurological:     Mental Status: She is alert and oriented to person, place, and time.  Psychiatric:        Mood and Affect: Mood normal.        Behavior: Behavior normal.     Imaging: CT GUIDED VISCERAL FLUID DRAIN BY PERC CATH  Result Date: 09/05/2022 INDICATION: Postoperative fluid collection concerning for abscess. EXAM: CT GUIDED DRAINAGE OF RIGHT LOWER QUADRANT ABSCESS TECHNIQUE: Multidetector CT imaging of Debra abdomen was performed following Debra standard protocol without IV contrast. RADIATION DOSE REDUCTION: This exam was performed according to Debra departmental dose-optimization program which includes automated exposure control, adjustment of the mA and/or kV according to patient size and/or use of iterative reconstruction technique. MEDICATIONS: Debra patient is currently admitted to [facility] hospital and receiving intravenous antibiotics. Debra antibiotics were administered within an appropriate time frame prior to Debra initiation of Debra procedure. ANESTHESIA/SEDATION: Moderate (conscious) sedation was employed during this procedure. A total of Versed 1 mg and Fentanyl 25 mcg  was administered intravenously by Debra radiology nurse. Total intra-service moderate Sedation Time: 12 minutes. Debra patient's level of consciousness and vital signs were monitored continuously by radiology nursing throughout Debra procedure under my direct supervision. COMPLICATIONS: None immediate. TECHNIQUE: Informed written consent was obtained from Debra patient after a thorough discussion of Debra procedural risks, benefits and alternatives. All questions were addressed. Maximal  Sterile Barrier Technique was utilized including caps, mask, sterile gowns, sterile gloves, sterile drape, hand hygiene and skin antiseptic. A timeout was performed prior to Debra initiation of Debra procedure. PROCEDURE: Debra operative field was prepped with chlorhexidine in a sterile fashion, and a sterile drape was applied covering Debra operative field. A sterile gown and sterile gloves were used for Debra procedure. Local anesthesia was provided with 1% Lidocaine. CT imaging was obtained. Debra fluid and gas collection in Debra right lower quadrant was successfully identified. A suitable skin entry site was selected. Anesthesia was attained by infiltration with 1% lidocaine. A small dermatotomy was made. Under intermittent CT guidance an 18 gauge trocar needle was carefully advanced from a right lower quadrant approach into Debra fluid and gas collection. A 0.035 wire was then coiled in Debra collection. Debra needle was removed. Debra tract was dilated to 44 [place]. A skater 12 French drain was advanced over Debra wire and formed. Aspiration yields approximately 20 mL of turbid opaque brownish colored fluid. This was sent for Gram stain and culture. Debra catheter was then lavaged and connected to JP bulb suction before being secured to Debra skin with 0 Prolene suture. Follow-up CT imaging demonstrates a well-positioned drainage catheter without evidence of complication. FINDINGS: Well-positioned drainage catheter with resolution of fluid collection. IMPRESSION: Successful 49F drain into Debra right lower quadrant fluid collection. Aspiration yields turbid opaque brown fluid which was sent for Gram stain and culture. Electronically Signed   By: Malachy Moan M.D.   On: 09/05/2022 13:46   CT ABDOMEN PELVIS W CONTRAST  Result Date: 09/04/2022 CLINICAL DATA:  Postoperative abdominal pain. Right hemicolectomy and ileostomy 13 days ago. EXAM: CT ABDOMEN AND PELVIS WITH CONTRAST TECHNIQUE: Multidetector CT imaging of Debra abdomen and  pelvis was performed using Debra standard protocol following bolus administration of intravenous contrast. RADIATION DOSE REDUCTION: This exam was performed according to Debra departmental dose-optimization program which includes automated exposure control, adjustment of the mA and/or kV according to patient size and/or use of iterative reconstruction technique. CONTRAST:  75mL OMNIPAQUE IOHEXOL 350 MG/ML SOLN COMPARISON:  CT abdomen and pelvis 08/30/2022 FINDINGS: Lower chest: There are small bilateral pleural effusions and bibasilar atelectasis similar to Debra prior examination. Hepatobiliary: No focal liver abnormality is seen. No gallstones, gallbladder wall thickening, or biliary dilatation. Pancreas: Unremarkable. No pancreatic ductal dilatation or surrounding inflammatory changes. Spleen: Debra spleen appears enlarged. In his nearly completely replaced with low-attenuation with some peripheral capsular enhancement. Appearance is similar to Debra prior study. There is no surrounding inflammatory stranding. Adrenals/Urinary Tract: Debra bilateral adrenal glands and left kidney are within normal limits. There is a new right-sided percutaneous nephrostomy with distal catheter tip in Debra right renal pelvis. There is severe right-sided hydronephrosis with some layering hyperdensity in Debra calices. Hydronephrosis has minimally decreased when compared to Debra prior study. Debra right ureter remains dilated to Debra level of Debra pelvic inlet, mildly decreased from prior. Debra bladder appears within normal limits. Stomach/Bowel: Right-sided ostomy is again noted. Oral contrast reaches mid small bowel. Mid and proximal small bowel loops are dilated measuring up to 4 cm and  contain air-fluid levels. Debra tip of an enteric tube is in Debra gastric antrum. There are nondilated small bowel loops in Debra right lower quadrant, although definitive transition point is not visualized. Vascular/Lymphatic: Aortic atherosclerosis. Thrombus in Debra  splenic vein appears unchanged on image 3/26. No enlarged abdominal or pelvic lymph nodes. Reproductive: Uterus is enlarged containing calcified fibroids. Debra ovaries are not well delineated. Other: Again seen is a percutaneous drainage catheter entering Debra anterior left lower quadrant abdomen. Debra catheter traverses Debra pelvis with distal tip in Debra right lower quadrant. There is an ill-defined septated enhancing low-density collection in Debra pelvis abutting Debra uterus and sigmoid colon. Small foci of air are seen within Debra collection similar to Debra prior study. This area measures proximally 9.5 x 6.3 by 10.8 cm and appears similar to Debra prior examination. Enhancing fluid collection containing air in Debra right paracolic gutter measures 5.2 x 2.7 by 3.1 cm and appears relatively stable in size. Debra percutaneous catheter does traverse this collection. Enhancing fluid collection containing a small amount of air abutting Debra fibroid uterus anteriorly measures 5.4 by 1.4 by 1.0 cm and has minimally decreased in size in Debra interval. Enhancing fluid collection in Debra pelvis abutting Debra rectosigmoid colon image 3/80 appears stable in size measuring 2.6 x 1.6 cm. Small amount of free fluid in Debra left lower quadrant and diffuse body wall edema appear unchanged. Musculoskeletal: Osseous structures appear stable. IMPRESSION: 1. Dilated small bowel loops with air-fluid levels worrisome for small bowel obstruction. Oral contrast reaches mid small bowel. 2. New right-sided percutaneous nephrostomy with distal catheter tip in Debra right renal pelvis. There is severe right-sided hydronephrosis which has minimally decreased from Debra prior study. Right ureter remains dilated to Debra level of Debra pelvic inlet, mildly decreased from Debra prior study. Layering hyperdensity in Debra right renal pelvis may represent bladder infection. 3. Stable percutaneous drainage catheters in Debra left lower quadrant and right lower quadrant. 4.  Multiple complex fluid collections, some of which contain air, are again seen throughout Debra abdomen and pelvis. Debra larger collections are stable in size. Smaller collections have minimally decreased in size. 5. Stable small bilateral pleural effusions and bibasilar atelectasis. 6. Stable splenomegaly with near complete replacement of Debra spleen with low-attenuation and peripheral enhancement. Findings are worrisome for splenic infarct. 7. Stable splenic vein thrombosis. 8. Stable body wall edema. Aortic Atherosclerosis (ICD10-I70.0). Electronically Signed   By: Darliss Cheney M.D.   On: 09/04/2022 15:36    Labs:  CBC: Recent Labs    09/04/22 0405 09/05/22 0037 09/06/22 0103 09/07/22 0234  WBC 17.1* 20.3* 16.3* 15.4*  HGB 8.5* 8.2* 7.6* 8.0*  HCT 27.9* 27.4* 25.9* 28.8*  PLT 1,254* 1,279* 1,315* 1,239*    COAGS: Recent Labs    08/21/22 0259 09/05/22 0037  INR 1.1 1.2    BMP: Recent Labs    09/03/22 0729 09/04/22 0405 09/06/22 0103 09/07/22 0234  NA 133* 133* 134* 134*  K 3.6 3.6 3.6 4.5  CL 110 111 113* 112*  CO2 15* 17* 15* 14*  GLUCOSE 110* 100* 126* 134*  BUN 21 23 39* 43*  CALCIUM 7.7* 8.0* 8.5* 8.9  CREATININE 0.78 0.93 1.18* 1.42*  GFRNONAA >60 >60 50* 40*    LIVER FUNCTION TESTS: Recent Labs    08/22/22 2052 08/23/22 0922 08/31/22 0150 09/04/22 0405  BILITOT 1.9* 1.4* 0.5 0.1*  AST 9* 14* 17 18  ALT 8 7 12 11   ALKPHOS 44 35* 91 71  PROT  4.6* 4.6* 4.7* 4.6*  ALBUMIN 2.7* 2.4* 1.5* 1.5*    Assessment and Plan:  S/p PCN 7/15 for right hydronephrosis S/p RUQ drain 09/05/22 -culture pending, abundant gram positive rods, few gram negative rods, rare gram positive cocci on Gram stain. Culture grew pseudomonas aeruginosa and moderate streptococcus anginosis   Drain Location: Lateral RLQ Size: Fr size: 12 Fr Date of placement: 09/05/22  Currently to: Drain collection device: suction bulb 24 hour output:  Output by Drain (mL) 09/05/22 0701 - 09/05/22  1900 09/05/22 1901 - 09/06/22 0700 09/06/22 0701 - 09/06/22 1900 09/06/22 1901 - 09/07/22 0700 09/07/22 0701 - 09/07/22 1651  Closed System Drain 1 Left LLQ  20     Closed System Drain Lateral RUQ  20       Interval imaging/drain manipulation:  None  Current examination: Flushes/aspirates easily.  Insertion site unremarkable. Suture and stat lock in place. Dressed appropriately.  ~15 mL of tan purulent fluid in bulb at time of exam. ~150 mL bloody urine in nephrostomy gravity bag at time of exam  Plan: Continue TID flushes with 5 cc NS. Record output Q shift. Dressing changes QD or PRN if soiled.  Call IR APP or on call IR MD if difficulty flushing or sudden change in drain output.  Repeat imaging/possible drain injection once output < 10 mL/QD (excluding flush material). Consideration for drain removal if output is < 10 mL/QD (excluding flush material), pending discussion with Debra providing surgical service.  Discharge planning: Please contact IR APP or on call IR MD prior to patient d/c to ensure appropriate follow up plans are in place. Typically patient will follow up with IR [facility] 10-14 days post d/c for repeat imaging/possible drain injection. IR scheduler will contact patient with date/time of appointment. Patient will need to flush drain QD with 5 cc NS, record output QD, dressing changes every 2-3 days or earlier if soiled.   IR will continue to follow - please call with questions or concerns.     Electronically Signed: Kennieth Francois, PA-C 09/07/2022, 4:44 PM   I spent a total of 15 Minutes at Debra Debra patient's bedside AND on Debra patient's hospital floor or unit, greater than 50% of which was counseling/coordinating care for S/p PCN 7/15 for right hydronephrosis S/p RLQ drain 09/05/22

## 2022-09-07 NOTE — Progress Notes (Signed)
16 Days Post-Op Subjective: 7/16: NAEON. Pt alert and oriented. Just getting back from CT on my arrival.   Objective: Vital signs in last 24 hours: Temp:  [97.5 F (36.4 C)-98.4 F (36.9 C)] 97.8 F (36.6 C) (07/22 0739) Pulse Rate:  [76-109] 76 (07/22 0301) Resp:  [12-18] 16 (07/22 0739) BP: (124-147)/(55-60) 138/58 (07/22 0739) SpO2:  [100 %] 100 % (07/22 0805) Weight:  [78 kg] 78 kg (07/22 0301)  Intake/Output from previous day: 07/21 0701 - 07/22 0700 In: 410 [IV Piggyback:410] Out: 4080 [Urine:1480; Stool:2600]  Intake/Output this shift: No intake/output data recorded.  Physical Exam:  General: Alert and oriented CV: No cyanosis Lungs: equal chest rise Abdomen: Soft, NTND, no rebound or guarding Gu: Pure wick catheter  Lab Results: Recent Labs    09/05/22 0037 09/06/22 0103 09/07/22 0234  HGB 8.2* 7.6* 8.0*  HCT 27.4* 25.9* 28.8*   BMET Recent Labs    09/06/22 0103 09/07/22 0234  NA 134* 134*  K 3.6 4.5  CL 113* 112*  CO2 15* 14*  GLUCOSE 126* 134*  BUN 39* 43*  CREATININE 1.18* 1.42*  CALCIUM 8.5* 8.9     Studies/Results: CT GUIDED VISCERAL FLUID DRAIN BY PERC CATH  Result Date: 09/05/2022 INDICATION: Postoperative fluid collection concerning for abscess. EXAM: CT GUIDED DRAINAGE OF RIGHT LOWER QUADRANT ABSCESS TECHNIQUE: Multidetector CT imaging of the abdomen was performed following the standard protocol without IV contrast. RADIATION DOSE REDUCTION: This exam was performed according to the departmental dose-optimization program which includes automated exposure control, adjustment of the mA and/or kV according to patient size and/or use of iterative reconstruction technique. MEDICATIONS: The patient is currently admitted to the hospital and receiving intravenous antibiotics. The antibiotics were administered within an appropriate time frame prior to the initiation of the procedure. ANESTHESIA/SEDATION: Moderate (conscious) sedation was employed  during this procedure. A total of Versed 1 mg and Fentanyl 25 mcg was administered intravenously by the radiology nurse. Total intra-service moderate Sedation Time: 12 minutes. The patient's level of consciousness and vital signs were monitored continuously by radiology nursing throughout the procedure under my direct supervision. COMPLICATIONS: None immediate. TECHNIQUE: Informed written consent was obtained from the patient after a thorough discussion of the procedural risks, benefits and alternatives. All questions were addressed. Maximal Sterile Barrier Technique was utilized including caps, mask, sterile gowns, sterile gloves, sterile drape, hand hygiene and skin antiseptic. A timeout was performed prior to the initiation of the procedure. PROCEDURE: The operative field was prepped with chlorhexidine in a sterile fashion, and a sterile drape was applied covering the operative field. A sterile gown and sterile gloves were used for the procedure. Local anesthesia was provided with 1% Lidocaine. CT imaging was obtained. The fluid and gas collection in the right lower quadrant was successfully identified. A suitable skin entry site was selected. Anesthesia was attained by infiltration with 1% lidocaine. A small dermatotomy was made. Under intermittent CT guidance an 18 gauge trocar needle was carefully advanced from a right lower quadrant approach into the fluid and gas collection. A 0.035 wire was then coiled in the collection. The needle was removed. The tract was dilated to 1 [place]. A skater 12 French drain was advanced over the wire and formed. Aspiration yields approximately 20 mL of turbid opaque brownish colored fluid. This was sent for Gram stain and culture. The catheter was then lavaged and connected to JP bulb suction before being secured to the skin with 0 Prolene suture. Follow-up CT imaging demonstrates a  well-positioned drainage catheter without evidence of complication. FINDINGS: Well-positioned  drainage catheter with resolution of fluid collection. IMPRESSION: Successful 43F drain into the right lower quadrant fluid collection. Aspiration yields turbid opaque brown fluid which was sent for Gram stain and culture. Electronically Signed   By: Malachy Moan M.D.   On: 09/05/2022 13:46    Assessment/Plan: # Severe right hydronephrosis 2/2 abdominal mass # Terminal ileum adenocarcinoma w/ peritoneal metastasis.  #AKI  Scr 1.42,  Trend labs  S/p bowel perf and hemicolectomy.   Repeat renal ultrasound was collected 7/11.  Right side hydronephrosis was unchanged.  No plan for further abd surgery for many months. R. PCNT 55f placed 7/15 to preserve kidney  Notable bleeding from R. PCNT with ongoing heparin infusion in treatment of acute PE. I doubt that she can come off of anticoagulation in her hypercoagulable state, but if there is a safe window, she would benefit from enough of a holiday to allow her bleeding to at least lessen, if not stop.   Urology will continue to follow along peripherally       LOS: 20 days   Elmon Kirschner, NP [facility] Pager: [phone]  09/07/2022, 9:42 AM

## 2022-09-07 NOTE — Progress Notes (Addendum)
Nutrition Follow-up  DOCUMENTATION CODES:   Non-severe (moderate) malnutrition in context of chronic illness  INTERVENTION:   Plan for Calorie Count given inability to determine adequacy of nutritional needs. If inadequate, recommend nutrition support (TPN vs EN). Discussed with Dr. Dwain Sharp  Ensure Debra Sharp po TID, each supplement provides 350 kcal and 20 grams of protein.  Feeding Assistance at meal times  Continue MVI, Vitamin C, Zinc  NUTRITION DIAGNOSIS:   Moderate Malnutrition related to chronic illness as evidenced by mild fat depletion, moderate muscle depletion.  Being addressed via supplements  GOAL:   Patient will meet greater than or equal to 90% of their needs  Not Met  MONITOR:   Diet advancement, Labs, Weight trends, TF tolerance, I & O's  REASON FOR ASSESSMENT:   Consult Enteral/tube feeding initiation and management  ASSESSMENT:   72 y.o. female presented to the ED with weakness, N/V, and abdominal pain. PMH includes COPD, HTN, HLD, GERD, and anxiety. Pt admitted with anemia 2/2 possible upper GI bleed, pelvic mass, and incidental stroke finding.  Pt remains Full Code; noted newly dx cancer, GI primary-terminal ileus adenocarcinoma with peritoneal mets not resectable per surgery, Oncology feels cancer is likely uncurable and overall prognosis is poor. Palliative Care has seen pt.   RMQ ileostomy, RLQ mucus fistula Ileostomy output documented as 2.6L, high output. Noted pt receiving imodium, lomotil, fibercon UOP 1.5 L  Diet advanced to Soft on 7/20 but no recorded po intake since the 19th. Pt reports she is eating some, drinking Ensure shakes  Noted Cortrak tube has been removed; pt had been on trickle TF on Friday, not sure that TF ever advanced past trickle. Per chart review, Cortrak appears to have been removed yesterday 7/21  Labs: sodium 13 4(L), BUN 43, Creatinine 1.42, potassium 4.5 (wdl) Meds: zinc sulfate  Diet Order:   Diet Order              DIET SOFT Room service appropriate? Yes; Fluid consistency: Thin  Diet effective now                   EDUCATION NEEDS:   Not appropriate for education at this time  Skin:  Skin Assessment: Reviewed RN Assessment  Last BM:  7/15 - type 7  Height:   Ht Readings from Last 1 Encounters:  08/22/22 5' 5.98" (1.676 m)    Weight:   Wt Readings from Last 1 Encounters:  09/07/22 78 kg    Ideal Body Weight:  59.1 kg  BMI:  Body mass index is 27.77 kg/m.  Estimated Nutritional Needs:   Kcal:  1850-2050  Protein:  90-110 grams  Fluid:  >/= 1.8 L   Debra Starcher MS, RDN, LDN, CNSC Registered Dietitian 3 Clinical Nutrition RD Pager and On-Call Pager Number Located in [place]

## 2022-09-07 NOTE — Progress Notes (Signed)
16 Days Post-Op   Subjective/Chief Complaint: CC: Seen with RN, WOCN and Urology.   Patient with some R sided abdominal pain last night that she felt was gas. No abdominal pain, n/v this am. Eating ~1/4 of her trays and some shakes. No n/v. Having ostomy output 2.6L/24 hours.   Objective: Vital signs in last 24 hours: Temp:  [97.5 F (36.4 C)-98.4 F (36.9 C)] 97.8 F (36.6 C) (07/22 0739) Pulse Rate:  [76-109] 76 (07/22 0301) Resp:  [12-18] 16 (07/22 0739) BP: (124-147)/(55-60) 138/58 (07/22 0739) SpO2:  [100 %] 100 % (07/22 0805) Weight:  [78 kg] 78 kg (07/22 0301) Last BM Date : 09/06/22  Intake/Output from previous day: 07/21 0701 - 07/22 0700 In: 410 [IV Piggyback:410] Out: 4080 [Urine:1480; Stool:2600] Intake/Output this shift: No intake/output data recorded.  PE: Gen: Alert, NAD, pleasant Abd: Soft, mild distension, NT. Stoma and mucus fistula viable - ostomy appliance with dark thin liquid in eakins bag.  Midline wound overall clean with some fibrinous tissue at the base with granulation tissue otherwise. L surgical JP drain with serous output with some debris - less purulent today. IR drain with light brown milky/purulent drainage.   Lab Results:  Recent Labs    09/06/22 0103 09/07/22 0234  WBC 16.3* 15.4*  HGB 7.6* 8.0*  HCT 25.9* 28.8*  PLT 1,315* 1,239*   BMET Recent Labs    09/06/22 0103 09/07/22 0234  NA 134* 134*  K 3.6 4.5  CL 113* 112*  CO2 15* 14*  GLUCOSE 126* 134*  BUN 39* 43*  CREATININE 1.18* 1.42*  CALCIUM 8.5* 8.9   PT/INR Recent Labs    09/05/22 0037  LABPROT 15.1  INR 1.2   ABG No results for input(s): "PHART", "HCO3" in the last 72 hours.  Invalid input(s): "PCO2", "PO2"  Studies/Results: CT GUIDED VISCERAL FLUID DRAIN BY [facility] CATH  Result Date: 09/05/2022 INDICATION: Postoperative fluid collection concerning for abscess. EXAM: CT GUIDED DRAINAGE OF RIGHT LOWER QUADRANT ABSCESS TECHNIQUE: Multidetector CT imaging of the  abdomen was performed following the standard protocol without IV contrast. RADIATION DOSE REDUCTION: This exam was performed according to the departmental dose-optimization program which includes automated exposure control, adjustment of the mA and/or kV according to patient size and/or use of iterative reconstruction technique. MEDICATIONS: The patient is currently admitted to the hospital and receiving intravenous antibiotics. The antibiotics were administered within an appropriate time frame prior to the initiation of the procedure. ANESTHESIA/SEDATION: Moderate (conscious) sedation was employed during this procedure. A total of Versed 1 mg and Fentanyl 25 mcg was administered intravenously by the radiology nurse. Total intra-service moderate Sedation Time: 12 minutes. The patient's level of consciousness and vital signs were monitored continuously by radiology nursing throughout the procedure under my direct supervision. COMPLICATIONS: None immediate. TECHNIQUE: Informed written consent was obtained from the patient after a thorough discussion of the procedural risks, benefits and alternatives. All questions were addressed. Maximal Sterile Barrier Technique was utilized including caps, mask, sterile gowns, sterile gloves, sterile drape, hand hygiene and skin antiseptic. A timeout was performed prior to the initiation of the procedure. PROCEDURE: The operative field was prepped with chlorhexidine in a sterile fashion, and a sterile drape was applied covering the operative field. A sterile gown and sterile gloves were used for the procedure. Local anesthesia was provided with 1% Lidocaine. CT imaging was obtained. The fluid and gas collection in the right lower quadrant was successfully identified. A suitable skin entry site was selected. Anesthesia was attained  by infiltration with 1% lidocaine. A small dermatotomy was made. Under intermittent CT guidance an 18 gauge trocar needle was carefully advanced from a  right lower quadrant approach into the fluid and gas collection. A 0.035 wire was then coiled in the collection. The needle was removed. The tract was dilated to 91 [place]. A skater 12 French drain was advanced over the wire and formed. Aspiration yields approximately 20 mL of turbid opaque brownish colored fluid. This was sent for Gram stain and culture. The catheter was then lavaged and connected to JP bulb suction before being secured to the skin with 0 Prolene suture. Follow-up CT imaging demonstrates a well-positioned drainage catheter without evidence of complication. FINDINGS: Well-positioned drainage catheter with resolution of fluid collection. IMPRESSION: Successful 88F drain into the right lower quadrant fluid collection. Aspiration yields turbid opaque brown fluid which was sent for Gram stain and culture. Electronically Signed   By: Malachy Moan M.D.   On: 09/05/2022 13:46    Anti-infectives: Anti-infectives (From admission, onward)    Start     Dose/Rate Route Frequency Ordered Stop   09/06/22 1200  ceFEPIme (MAXIPIME) 2 g in sodium chloride 0.9 % 100 mL IVPB        2 g 200 mL/hr over 30 Minutes Intravenous Every 12 hours 09/06/22 0920     09/04/22 2200  metroNIDAZOLE (FLAGYL) tablet 500 mg        500 mg Oral Every 12 hours 09/04/22 0741     08/30/22 0830  ceFEPIme (MAXIPIME) 2 g in sodium chloride 0.9 % 100 mL IVPB  Status:  Discontinued        2 g 200 mL/hr over 30 Minutes Intravenous Every 8 hours 08/30/22 0826 09/06/22 0920   08/23/22 2100  ceFEPIme (MAXIPIME) 2 g in sodium chloride 0.9 % 100 mL IVPB  Status:  Discontinued        2 g 200 mL/hr over 30 Minutes Intravenous Every 12 hours 08/23/22 1357 08/30/22 0826   08/23/22 0400  micafungin (MYCAMINE) 100 mg in sodium chloride 0.9 % 100 mL IVPB        100 mg 105 mL/hr over 1 Hours Intravenous Daily 08/23/22 0242     08/22/22 1800  metroNIDAZOLE (FLAGYL) IVPB 500 mg  Status:  Discontinued        500 mg 100 mL/hr over 60  Minutes Intravenous Every 12 hours 08/22/22 1711 09/04/22 0741   08/22/22 1800  ceFEPIme (MAXIPIME) 2 g in sodium chloride 0.9 % 100 mL IVPB  Status:  Discontinued        2 g 200 mL/hr over 30 Minutes Intravenous Every 8 hours 08/22/22 1716 08/23/22 1357   08/22/22 1724  metroNIDAZOLE (FLAGYL) 500 MG/100ML IVPB       Note to Pharmacy: [name]: cabinet override      08/22/22 1724 08/23/22 0845   08/19/22 1030  cefTRIAXone (ROCEPHIN) 1 g in sodium chloride 0.9 % 100 mL IVPB  Status:  Discontinued        1 g 200 mL/hr over 30 Minutes Intravenous Every 24 hours 08/19/22 0937 08/22/22 1715       Assessment/Plan: POD 16 s/p exploratory laparotomy, right hemicolectomy, ileostomy, mucus fistula, biopsy of pelvic mass by Dr. 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 Margins negative. 0/43 lymph nodes.  - CEA - 143, CA 125: 40 and CA 19-9: 50 - Has had CT CAP  since admission - Discussed case with attending - given mass is cancerous, the concern is that this was not resectable during her initial operation. If this progresses and causes another obstruction I think her options would be limited surgically. Dr. Mosetta Putt of Oncology has seen and felt her cancer is likely incurable and her overall prognosis is poor. They will plan to see her back in 4-6 weeks to discuss options for chemo. Palliative is following - patient wishes to proceed with full code/full scope   - High ileostomy output. Cont BID fiber BID iron and BID Lomotil. Increase imodium today.  Monitor output, Cr, electrolytes. Avoid sugary drinks.  - WOC for stoma teaching and assistance with pouching. - BID wet to dry to midline - CT 7/14 obtained for drain appearing feculent - this showed intra-abdominal fluid collections. IR consulted for drainage but felt the intra-abdominal fluid collections are small and  look like post-op blood. Repeat CT 7/19 similar. S/p IR drain 7/20 with tubry cloudy fluid.  - Cx pending from IR drain. Cont abx - recommend ID consult for abx recommendations and duration.  - Cont JP drain - no longer feculent  - IR drain and flushes per IR.  - Mobilize, PT - rec for SNF   FEN: Soft diet. Shakes.  VTE: SCDs, Heparin gtt. Okay to change to DOAC ID: Cefepime/flagyl/micafungin Foley: Removed, Monitor. Urology following. S/p R PCN Dispo - As above   Per [facility] New stroke - Seen on MRI 7/10, suspected thromboembolic, defer to primary team/stroke team for further w/u and management.  PE - heparin drip per primary.  HTN HLD GERD COPD R hydro - s/p R PCN CT 7/14 also suggestion of splenic infarct and bilateral pleural effusions   LOS: 20 days    Elmer Sow [facility] 09/07/2022

## 2022-09-08 DIAGNOSIS — K651 Peritoneal abscess: Secondary | ICD-10-CM | POA: Diagnosis not present

## 2022-09-08 DIAGNOSIS — K922 Gastrointestinal hemorrhage, unspecified: Secondary | ICD-10-CM | POA: Diagnosis not present

## 2022-09-08 LAB — BASIC METABOLIC PANEL
Anion gap: 5 (ref 5–15)
BUN: 47 mg/dL — ABNORMAL HIGH (ref 8–23)
CO2: 16 mmol/L — ABNORMAL LOW (ref 22–32)
Calcium: 9.2 mg/dL (ref 8.9–10.3)
Chloride: 115 mmol/L — ABNORMAL HIGH (ref 98–111)
Creatinine, Ser: 1.37 mg/dL — ABNORMAL HIGH (ref 0.44–1.00)
GFR, Estimated: 42 mL/min — ABNORMAL LOW (ref >60)
Glucose, Bld: 107 mg/dL — ABNORMAL HIGH (ref 70–99)
Potassium: 4.1 mmol/L (ref 3.5–5.1)
Sodium: 136 mmol/L (ref 135–145)

## 2022-09-08 LAB — CBC
HCT: 25 % — ABNORMAL LOW (ref 36.0–46.0)
Hemoglobin: 7.4 g/dL — ABNORMAL LOW (ref 12.0–15.0)
MCH: 24.9 pg — ABNORMAL LOW (ref 26.0–34.0)
MCHC: 29.6 g/dL — ABNORMAL LOW (ref 30.0–36.0)
MCV: 84.2 fL (ref 80.0–100.0)
Platelets: 1173 10*3/uL (ref 150–400)
RBC: 2.97 MIL/uL — ABNORMAL LOW (ref 3.87–5.11)
RDW: 26.8 % — ABNORMAL HIGH (ref 11.5–15.5)
WBC: 15.8 10*3/uL — ABNORMAL HIGH (ref 4.0–10.5)
nRBC: 0 % (ref 0.0–0.2)

## 2022-09-08 LAB — AEROBIC/ANAEROBIC CULTURE W GRAM STAIN (SURGICAL/DEEP WOUND)

## 2022-09-08 LAB — MAGNESIUM: Magnesium: 1.7 mg/dL (ref 1.7–2.4)

## 2022-09-08 LAB — PHOSPHORUS: Phosphorus: 3.1 mg/dL (ref 2.5–4.6)

## 2022-09-08 LAB — HEPARIN LEVEL (UNFRACTIONATED): Heparin Unfractionated: 0.38 IU/mL (ref 0.30–0.70)

## 2022-09-08 MED ORDER — APIXABAN 5 MG PO TABS
5.0000 mg | ORAL_TABLET | Freq: Two times a day (BID) | ORAL | Status: DC
  Administered 2022-09-08 – 2022-09-13 (×8): 5 mg via ORAL
  Filled 2022-09-08 (×10): qty 1

## 2022-09-08 NOTE — Progress Notes (Signed)
Addendum: Patient is transitioning from heparin to Eliquis 5 mg BID. Pharmacy will follow peripherally.  Nicole Kindred, PharmD PGY1 Pharmacy Resident 09/08/2022 12:56 PM

## 2022-09-08 NOTE — Progress Notes (Signed)
Calorie Count Note Day 1 Results   48 hour calorie count ordered.  Diet: SOFT  Supplements: Ensure Plus High Protein po TID, each supplement provides 350 kcal and 20 grams of protein.   Breakfast: N/A Lunch: 2 bites of mac n cheese, 2 bites of meatloaf with gravy, 1 cup of collard greens, 100% unsweetened iced tea   Dinner: N/A Supplements: 2.5 Ensure   Total intake: 935 kcal (51% of minimum estimated needs)  54 g protein (60% of minimum estimated needs)  Nutrition Dx: Moderate Malnutrition related to chronic illness as evidenced by mild fat depletion, moderate muscle depletion.   Goal: Patient will meet greater than or equal to 90% of their needs   Intervention:  continue Calorie Count given inability to determine adequacy of nutritional needs. If inadequate, recommend nutrition support (TPN vs EN). Discussed with Dr. Dwain Sarna   Ensure Shiela Mayer po TID, each supplement provides 350 kcal and 20 grams of protein.   Feeding Assistance at meal times   Continue MVI, Vitamin C, Zinc  Leodis Rains, RDN, LDN  Clinical Nutrition

## 2022-09-08 NOTE — Progress Notes (Signed)
Referring Physician(s): Dr Dwain Sarna  Supervising Physician: Marliss Coots  Patient Status:  [facility] - In-pt  Chief Complaint:  POD 17 s/p exploratory laparotomy, right hemicolectomy, ileostomy, mucus fistula, biopsy of pelvic mass by Dr. Bedelia Person 7/6 for Colonic perforation secondary to obstructing pelvic mass    Subjective:  RLQ abscess drain placed in IR 7/20 R PCN placed in IR 7/15  Both intact Drain flushes easily-- OP purulent PCN OP bloody--- Urology following   Allergies: Iodinated contrast media, Aspirin, Ibuprofen, Sulfonamide derivatives, and Penicillins  Medications: Prior to Admission medications   Medication Sig Start Date End Date Taking? Authorizing Provider  cetirizine (ZYRTEC) 10 MG tablet Take 1 tablet (10 mg total) by mouth daily. Patient taking differently: Take 10 mg by mouth daily as needed for allergies or rhinitis. 10/09/19  Yes Hawks, Christy A, FNP  ferrous sulfate 325 (65 FE) MG EC tablet Take 325 mg by mouth daily.   Yes [provider]  fluticasone (FLONASE) 50 MCG/ACT nasal spray Place 2 sprays into both nostrils daily. Patient taking differently: Place 2 sprays into both nostrils at bedtime as needed for allergies or rhinitis. 10/09/19  Yes Hawks, Christy A, FNP  lisinopril (ZESTRIL) 40 MG tablet Take 40 mg by mouth in the morning.   Yes [provider]  lovastatin (MEVACOR) 20 MG tablet Take 20 mg by mouth at bedtime.   Yes [provider]  PROAIR HFA 108 (90 Base) MCG/ACT inhaler Inhale 2 puffs into the lungs every 6 (six) hours as needed for wheezing or shortness of breath.   Yes [provider]  sertraline (ZOLOFT) 100 MG tablet Take 150 mg by mouth in the morning.   Yes [provider]  SYMBICORT 160-4.5 MCG/ACT inhaler Inhale 2 puffs into the lungs in the morning and at bedtime.   Yes [provider]     Vital Signs: BP 118/62 (BP Location: Right Leg)   Pulse 84   Temp 97.8 F (36.6  C) (Oral)   Resp 10   Ht 5' 5.98" (1.676 m)   Wt 171 lb 15.3 oz (78 kg)   SpO2 99%   BMI 27.77 kg/m   Physical Exam Vitals reviewed.  Skin:    General: Skin is warm.     Comments: Skin sites for RLQ drain and R PCN are both C/D/I Drain flushes easily- OP purulent NT no bleeding Culture FEW PSEUDOMONAS AERUGINOSA MODERATE STREPTOCOCCUS ANGINOSIS Report Status PENDING Organism ID, Bacteria PSEUDOMONAS AERUGINOSA   PCN draining well; bloody No growth  Neurological:     Mental Status: She is alert.     Imaging: CT GUIDED VISCERAL FLUID DRAIN BY [facility] CATH  Result Date: 09/05/2022 INDICATION: Postoperative fluid collection concerning for abscess. EXAM: CT GUIDED DRAINAGE OF RIGHT LOWER QUADRANT ABSCESS TECHNIQUE: Multidetector CT imaging of the abdomen was performed following the standard protocol without IV contrast. RADIATION DOSE REDUCTION: This exam was performed according to the departmental dose-optimization program which includes automated exposure control, adjustment of the mA and/or kV according to patient size and/or use of iterative reconstruction technique. MEDICATIONS: The patient is currently admitted to the hospital and receiving intravenous antibiotics. The antibiotics were administered within an appropriate time frame prior to the initiation of the procedure. ANESTHESIA/SEDATION: Moderate (conscious) sedation was employed during this procedure. A total of Versed 1 mg and Fentanyl 25 mcg was administered intravenously by the radiology nurse. Total intra-service moderate Sedation Time: 12 minutes. The patient's level of consciousness and vital signs were monitored  continuously by radiology nursing throughout the procedure under my direct supervision. COMPLICATIONS: None immediate. TECHNIQUE: Informed written consent was obtained from the patient after a thorough discussion of the procedural risks, benefits and alternatives. All questions were addressed. Maximal Sterile  Barrier Technique was utilized including caps, mask, sterile gowns, sterile gloves, sterile drape, hand hygiene and skin antiseptic. A timeout was performed prior to the initiation of the procedure. PROCEDURE: The operative field was prepped with chlorhexidine in a sterile fashion, and a sterile drape was applied covering the operative field. A sterile gown and sterile gloves were used for the procedure. Local anesthesia was provided with 1% Lidocaine. CT imaging was obtained. The fluid and gas collection in the right lower quadrant was successfully identified. A suitable skin entry site was selected. Anesthesia was attained by infiltration with 1% lidocaine. A small dermatotomy was made. Under intermittent CT guidance an 18 gauge trocar needle was carefully advanced from a right lower quadrant approach into the fluid and gas collection. A 0.035 wire was then coiled in the collection. The needle was removed. The tract was dilated to 25 [place]. A skater 12 French drain was advanced over the wire and formed. Aspiration yields approximately 20 mL of turbid opaque brownish colored fluid. This was sent for Gram stain and culture. The catheter was then lavaged and connected to JP bulb suction before being secured to the skin with 0 Prolene suture. Follow-up CT imaging demonstrates a well-positioned drainage catheter without evidence of complication. FINDINGS: Well-positioned drainage catheter with resolution of fluid collection. IMPRESSION: Successful 88F drain into the right lower quadrant fluid collection. Aspiration yields turbid opaque brown fluid which was sent for Gram stain and culture. Electronically Signed   By: Malachy Moan M.D.   On: 09/05/2022 13:46   CT ABDOMEN PELVIS W CONTRAST  Result Date: 09/04/2022 CLINICAL DATA:  Postoperative abdominal pain. Right hemicolectomy and ileostomy 13 days ago. EXAM: CT ABDOMEN AND PELVIS WITH CONTRAST TECHNIQUE: Multidetector CT imaging of the abdomen and pelvis was  performed using the standard protocol following bolus administration of intravenous contrast. RADIATION DOSE REDUCTION: This exam was performed according to the departmental dose-optimization program which includes automated exposure control, adjustment of the mA and/or kV according to patient size and/or use of iterative reconstruction technique. CONTRAST:  75mL OMNIPAQUE IOHEXOL 350 MG/ML SOLN COMPARISON:  CT abdomen and pelvis 08/30/2022 FINDINGS: Lower chest: There are small bilateral pleural effusions and bibasilar atelectasis similar to the prior examination. Hepatobiliary: No focal liver abnormality is seen. No gallstones, gallbladder wall thickening, or biliary dilatation. Pancreas: Unremarkable. No pancreatic ductal dilatation or surrounding inflammatory changes. Spleen: The spleen appears enlarged. In his nearly completely replaced with low-attenuation with some peripheral capsular enhancement. Appearance is similar to the prior study. There is no surrounding inflammatory stranding. Adrenals/Urinary Tract: The bilateral adrenal glands and left kidney are within normal limits. There is a new right-sided percutaneous nephrostomy with distal catheter tip in the right renal pelvis. There is severe right-sided hydronephrosis with some layering hyperdensity in the calices. Hydronephrosis has minimally decreased when compared to the prior study. The right ureter remains dilated to the level of the pelvic inlet, mildly decreased from prior. The bladder appears within normal limits. Stomach/Bowel: Right-sided ostomy is again noted. Oral contrast reaches mid small bowel. Mid and proximal small bowel loops are dilated measuring up to 4 cm and contain air-fluid levels. The tip of an enteric tube is in the gastric antrum. There are nondilated small bowel loops in the right lower  quadrant, although definitive transition point is not visualized. Vascular/Lymphatic: Aortic atherosclerosis. Thrombus in the splenic vein  appears unchanged on image 3/26. No enlarged abdominal or pelvic lymph nodes. Reproductive: Uterus is enlarged containing calcified fibroids. The ovaries are not well delineated. Other: Again seen is a percutaneous drainage catheter entering the anterior left lower quadrant abdomen. The catheter traverses the pelvis with distal tip in the right lower quadrant. There is an ill-defined septated enhancing low-density collection in the pelvis abutting the uterus and sigmoid colon. Small foci of air are seen within the collection similar to the prior study. This area measures proximally 9.5 x 6.3 by 10.8 cm and appears similar to the prior examination. Enhancing fluid collection containing air in the right paracolic gutter measures 5.2 x 2.7 by 3.1 cm and appears relatively stable in size. The percutaneous catheter does traverse this collection. Enhancing fluid collection containing a small amount of air abutting the fibroid uterus anteriorly measures 5.4 by 1.4 by 1.0 cm and has minimally decreased in size in the interval. Enhancing fluid collection in the pelvis abutting the rectosigmoid colon image 3/80 appears stable in size measuring 2.6 x 1.6 cm. Small amount of free fluid in the left lower quadrant and diffuse body wall edema appear unchanged. Musculoskeletal: Osseous structures appear stable. IMPRESSION: 1. Dilated small bowel loops with air-fluid levels worrisome for small bowel obstruction. Oral contrast reaches mid small bowel. 2. New right-sided percutaneous nephrostomy with distal catheter tip in the right renal pelvis. There is severe right-sided hydronephrosis which has minimally decreased from the prior study. Right ureter remains dilated to the level of the pelvic inlet, mildly decreased from the prior study. Layering hyperdensity in the right renal pelvis may represent bladder infection. 3. Stable percutaneous drainage catheters in the left lower quadrant and right lower quadrant. 4. Multiple complex  fluid collections, some of which contain air, are again seen throughout the abdomen and pelvis. The larger collections are stable in size. Smaller collections have minimally decreased in size. 5. Stable small bilateral pleural effusions and bibasilar atelectasis. 6. Stable splenomegaly with near complete replacement of the spleen with low-attenuation and peripheral enhancement. Findings are worrisome for splenic infarct. 7. Stable splenic vein thrombosis. 8. Stable body wall edema. Aortic Atherosclerosis (ICD10-I70.0). Electronically Signed   By: Darliss Cheney M.D.   On: 09/04/2022 15:36    Labs:  CBC: Recent Labs    09/05/22 0037 09/06/22 0103 09/07/22 0234 09/08/22 0656  WBC 20.3* 16.3* 15.4* 15.8*  HGB 8.2* 7.6* 8.0* 7.4*  HCT 27.4* 25.9* 28.8* 25.0*  PLT 1,279* 1,315* 1,239* 1,173*    COAGS: Recent Labs    08/21/22 0259 09/05/22 0037  INR 1.1 1.2    BMP: Recent Labs    09/04/22 0405 09/06/22 0103 09/07/22 0234 09/08/22 0656  NA 133* 134* 134* 136  K 3.6 3.6 4.5 4.1  CL 111 113* 112* 115*  CO2 17* 15* 14* 16*  GLUCOSE 100* 126* 134* 107*  BUN 23 39* 43* 47*  CALCIUM 8.0* 8.5* 8.9 9.2  CREATININE 0.93 1.18* 1.42* 1.37*  GFRNONAA >60 50* 40* 42*    LIVER FUNCTION TESTS: Recent Labs    08/22/22 2052 08/23/22 0922 08/31/22 0150 09/04/22 0405  BILITOT 1.9* 1.4* 0.5 0.1*  AST 9* 14* 17 18  ALT 8 7 12 11   ALKPHOS 44 35* 91 71  PROT 4.6* 4.6* 4.7* 4.6*  ALBUMIN 2.7* 2.4* 1.5* 1.5*   Drain Location: RLQ Size: Fr size: 12 Fr Date of placement: 09/05/22  Currently to: Drain collection device: suction bulb 24 hour output:  Output by Drain (mL) 09/06/22 0701 - 09/06/22 1900 09/06/22 1901 - 09/07/22 0700 09/07/22 0701 - 09/07/22 1900 09/07/22 1901 - 09/08/22 0700 09/08/22 0701 - 09/08/22 1152  Closed System Drain 1 Left LLQ   15 5   Closed System Drain Lateral RUQ   20 1     Interval imaging/drain manipulation:  none  Current examination: Flushes/aspirates  easily.  Insertion site unremarkable. Suture and stat lock in place. Dressed appropriately.  OP purulent  Plan: Continue TID flushes with 5 cc NS. Record output Q shift. Dressing changes QD or PRN if soiled.  Call IR APP or on call IR MD if difficulty flushing or sudden change in drain output.  Repeat imaging/possible drain injection once output < 10 mL/QD (excluding flush material). Consideration for drain removal if output is < 10 mL/QD (excluding flush material), pending discussion with the providing surgical service.  Discharge planning: Please contact IR APP or on call IR MD prior to patient d/c to ensure appropriate follow up plans are in place. Typically patient will follow up with IR clinic 10-14 days post d/c for repeat imaging/possible drain injection. IR scheduler will contact patient with date/time of appointment. Patient will need to flush drain QD with 5 cc NS, record output QD, dressing changes every 2-3 days or earlier if soiled.   IR will continue to follow - please call with questions or concerns.  Assessment and Plan:  RLQ abscess intact--- IR will follow Plans per [facility] R PCN intact-- OP bloody Urology following--- plans per Uro team  Electronically Signed: Robet Leu, PA-C 09/08/2022, 11:52 AM   I spent a total of 15 Minutes at the the patient's bedside AND on the patient's hospital floor or unit, greater than 50% of which was counseling/coordinating care for RLQ abscess drain; R PCN

## 2022-09-08 NOTE — Consult Note (Signed)
[facility]    Date of Admission:  08/18/2022     Reason for Consult: intraabd abscesses    Referring Provider: Willeen Niece      Abx: Cefepime Flagyl  Micafungin stopped        Assessment: Post small bowel obstruction/perforation exlap, complicated by intraabd abscess Ir drain 7/20 - cx in progress but growing strep anginosus and pseudomonas Right hydronephrosis s/p pcn 7/15  Imaging multiple abscesses Abd wound open for 2nd closure  Oncology and gen surgery involved   Plan: Continue cefepime flagyl; stop micafungin Would repeat abd ct end within 1-2 weeks for further abx determination Will follow   I spent more than 80 minute reviewing data/chart, and coordinating care, providing direct face to face time providing counseling/discussing diagnostics/treatment plan with patient and treatment team       ------------------------------------------------ Principal Problem:   GI bleed Active Problems:   Symptomatic anemia   Thrombocytosis   Iron deficiency anemia   Abnormal CT of the abdomen   Colon distention   History of cardioembolic cerebrovascular accident (CVA)   Silent cerebral infarction (HCC)   Bowel perforation (HCC)   Ventilator dependent (HCC)   Pelvic mass   Change in bowel habits   Abnormal CT scan, gastrointestinal tract   Arterial hypotension   Septic shock (HCC)   Fecal occult blood test positive   Uterine leiomyoma   Hydroureteronephrosis   Sepsis with acute hypoxic respiratory failure and septic shock (HCC)   Encephalopathy acute   Perforation bowel (HCC)   Malnutrition of moderate degree   Cerebrovascular accident (CVA) due to embolism of precerebral artery (HCC)   Acute metabolic encephalopathy   Delirium   Adenocarcinoma of small bowel (HCC)    HPI: Debra Sharp is a 72 y.o. 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 Margins negative. 0/43 lymph nodes.  - s/p exlap 7/06 and biopsy mass - Discussed case with attending - given mass is cancerous, the concern is that this was not resectable during her initial operation. If this progresses and causes another obstruction I think her options would be limited surgically. Dr. Mosetta Putt of Oncology has seen and felt her cancer is likely incurable and her overall prognosis is poor. They will plan to see her back in 4-6 weeks to discuss options for chemo. Palliative is following - patient wishes to proceed with full code/full scope - s/p ileostomy  - CT 7/14 obtained for drain appearing feculent - this showed intra-abdominal fluid collections. IR consulted for drainage but felt the intra-abdominal fluid collections are small and look like post-op blood. Repeat CT 7/19 similar. S/p IR drain 7/20 with tubry cloudy fluid.    Patient without complaint On vanc cefepime flagyl Reactive thrombocytosis    Family History  Problem Relation Age of Onset   Colon cancer Neg Hx    Colon polyps Neg Hx    Crohn's disease Neg Hx    Esophageal cancer Neg Hx    Rectal cancer Neg Hx    Stomach cancer Neg Hx    Ulcerative colitis Neg Hx     Social History   Tobacco Use   Smoking status: Former  Types: Cigarettes   Smokeless tobacco: Never  Vaping Use   Vaping status: Never Used  Substance Use Topics   Alcohol use: No   Drug use: No    Allergies  Allergen Reactions   Iodinated Contrast Media Shortness Of Breath and Itching    Resolved with benadryl and solu-mederol. Consider premedication prior to future studies.   Aspirin Other (See Comments)    Caused an asthma attack   Ibuprofen Swelling and Other (See Comments)     Angioedema and Asthma exacerbation   Sulfonamide Derivatives Swelling and Other (See Comments)    Face swelled SEVERELY   Penicillins Rash    Review of Systems: ROS All Other ROS was negative, except mentioned above   Past Medical History:  Diagnosis Date   Anemia    Anxiety    Arthritis    Asthma    Blood transfusion without reported diagnosis    Cataract    removed bilateral   COPD (chronic obstructive pulmonary disease) (HCC)    Depression    GERD (gastroesophageal reflux disease)    Hyperlipidemia    Hypertension        Scheduled Meds:  acetaminophen  650 mg Oral Q6H   arformoterol  15 mcg Nebulization BID   ascorbic acid  500 mg Oral Daily   atorvastatin  20 mg Oral Daily   budesonide (PULMICORT) nebulizer solution  0.5 mg Nebulization BID   Chlorhexidine Gluconate Cloth  6 each Topical Daily   diphenoxylate-atropine  1 tablet Oral BID   feeding supplement  237 mL Oral TID BM   ferrous sulfate  325 mg Oral BID WC   loperamide  2 mg Oral QID   melatonin  3 mg Oral QHS   metroNIDAZOLE  500 mg Oral Q12H   multivitamin with minerals  1 tablet Oral Daily   pantoprazole (PROTONIX) IV  40 mg Intravenous Q12H   polycarbophil  625 mg Oral BID   revefenacin  175 mcg Nebulization Daily   sertraline  150 mg Oral Daily   sodium bicarbonate  650 mg Oral TID   sodium chloride flush  10-40 mL Intracatheter Q12H   sodium chloride flush  5 mL Intracatheter Q8H   tamsulosin  0.4 mg Oral Daily   zinc sulfate  220 mg Oral Daily   Continuous Infusions:  sodium chloride 250 mL (09/04/22 2000)   ceFEPime (MAXIPIME) IV Stopped (09/08/22 0036)   heparin 1,700 Units/hr (09/07/22 2214)   micafungin (MYCAMINE) 100 mg in sodium chloride 0.9 % 100 mL IVPB 100 mg (09/07/22 0853)   promethazine (PHENERGAN) injection (IM or IVPB)     PRN Meds:.albuterol, hydrALAZINE, lip balm, melatonin, morphine injection, naLOXone (NARCAN)  injection, ondansetron **OR** ondansetron (ZOFRAN) IV,  mouth rinse, oxyCODONE, promethazine (PHENERGAN) injection (IM or IVPB), sodium chloride flush   OBJECTIVE: Blood pressure 118/62, pulse 84, temperature 97.8 F (36.6 C), temperature source Oral, resp. rate 10, height 5' 5.98" (1.676 m), weight 78 kg, SpO2 99%.  Physical Exam  General/constitutional: no distress, pleasant HEENT: Normocephalic, PER, Conj Clear, EOMI, Oropharynx clear Neck supple CV: rrr no mrg Lungs: clear to auscultation, normal respiratory effort Abd: Soft, Nontender -- open wound dressing clean/dry; left and right jp drain minimal output Gu: right pcn tube bloody output Ext: no edema Skin: No Rash Neuro: nonfocal MSK: no peripheral joint swelling/tenderness/warmth; back spines nontender   Lab Results Lab Results  Component Value Date   WBC 15.8 (H) 09/08/2022   HGB 7.4 (L) 09/08/2022  HCT 25.0 (L) 09/08/2022   MCV 84.2 09/08/2022   PLT 1,173 (HH) 09/08/2022    Lab Results  Component Value Date   CREATININE 1.37 (H) 09/08/2022   BUN 47 (H) 09/08/2022   NA 136 09/08/2022   K 4.1 09/08/2022   CL 115 (H) 09/08/2022   CO2 16 (L) 09/08/2022    Lab Results  Component Value Date   ALT 11 09/04/2022   AST 18 09/04/2022   ALKPHOS 71 09/04/2022   BILITOT 0.1 (L) 09/04/2022      Microbiology: Recent Results (from the past 240 hour(s))  Body fluid culture w Gram Stain     Status: None   Collection Time: 08/31/22  5:03 PM   Specimen: Urine, Random; Body Fluid  Result Value Ref Range Status   Specimen Description URINE, RANDOM  Final   Special Requests Normal  Final   Gram Stain   Final    WBC PRESENT, PREDOMINANTLY MONONUCLEAR NO ORGANISMS SEEN CYTOSPIN SMEAR    Culture   Final    NO GROWTH 3 DAYS Performed at [facility], [address], [place], [place] [address]    Report Status 09/03/2022 FINAL  Final  Aerobic/Anaerobic Culture w Gram Stain (surgical/deep wound)     Status: None (Preliminary result)   Collection Time: 09/05/22 12:14  PM   Specimen: Abscess  Result Value Ref Range Status   Specimen Description ABSCESS  Final   Special Requests NONE  Final   Gram Stain   Final    ABUNDANT WBC PRESENT, PREDOMINANTLY PMN ABUNDANT GRAM POSITIVE RODS FEW GRAM NEGATIVE RODS RARE GRAM POSITIVE COCCI Performed at [facility], [address], [place], [place] [address]    Culture   Final    FEW PSEUDOMONAS AERUGINOSA MODERATE STREPTOCOCCUS ANGINOSIS    Report Status PENDING  Incomplete     Serology:    Imaging: If present, new imagings (plain films, ct scans, and mri) have been personally visualized and interpreted; radiology reports have been reviewed. Decision making incorporated into the Impression / Recommendations.  7/19 ct abd pelv with contrast 1. Dilated small bowel loops with air-fluid levels worrisome for small bowel obstruction. Oral contrast reaches mid small bowel. 2. New right-sided percutaneous nephrostomy with distal catheter tip in the right renal pelvis. There is severe right-sided hydronephrosis which has minimally decreased from the prior study. Right ureter remains dilated to the level of the pelvic inlet, mildly decreased from the prior study. Layering hyperdensity in the right renal pelvis may represent bladder infection. 3. Stable percutaneous drainage catheters in the left lower quadrant and right lower quadrant. 4. Multiple complex fluid collections, some of which contain air, are again seen throughout the abdomen and pelvis. The larger collections are stable in size. Smaller collections have minimally decreased in size. 5. Stable small bilateral pleural effusions and bibasilar atelectasis. 6. Stable splenomegaly with near complete replacement of the spleen with low-attenuation and peripheral enhancement. Findings are worrisome for splenic infarct. 7. Stable splenic vein thrombosis. 8. Stable body wall edema.  Raymondo Band, MD [facility] [phone] pager    09/08/2022, 10:13 AM

## 2022-09-08 NOTE — Progress Notes (Signed)
17 Days Post-Op   Subjective/Chief Complaint: Ate dinner pretty well, no complaints, doesn't think she was oob yesterday   Objective: Vital signs in last 24 hours: Temp:  [97.4 F (36.3 C)-98.3 F (36.8 C)] 97.8 F (36.6 C) (07/23 0832) Pulse Rate:  [82-98] 84 (07/23 0832) Resp:  [10-18] 10 (07/23 0832) BP: (115-152)/(51-64) 118/62 (07/23 0832) SpO2:  [97 %-99 %] 99 % (07/23 0832) Last BM Date : 09/07/22  Intake/Output from previous day: 07/22 0701 - 07/23 0700 In: 789 [P.O.:480; I.V.:204; IV Piggyback:100] Out: 2431 [Urine:1040; Drains:41; Stool:1350] Intake/Output this shift: No intake/output data recorded.  Gen: Alert, NAD, pleasant Abd: Soft, mild distension, NT. Stoma and mucus fistula viable - ostomy appliance with dark thin liquid in eakins bag.  Midline wound clean L surgical JP drain with serous output with some debris  IR drain more clear  Lab Results:  Recent Labs    09/07/22 0234 09/08/22 0656  WBC 15.4* 15.8*  HGB 8.0* 7.4*  HCT 28.8* 25.0*  PLT 1,239* 1,173*   BMET Recent Labs    09/07/22 0234 09/08/22 0656  NA 134* 136  K 4.5 4.1  CL 112* 115*  CO2 14* 16*  GLUCOSE 134* 107*  BUN 43* 47*  CREATININE 1.42* 1.37*  CALCIUM 8.9 9.2   PT/INR No results for input(s): "LABPROT", "INR" in the last 72 hours. ABG No results for input(s): "PHART", "HCO3" in the last 72 hours.  Invalid input(s): "PCO2", "PO2"  Studies/Results: No results found.  Anti-infectives: Anti-infectives (From admission, onward)    Start     Dose/Rate Route Frequency Ordered Stop   09/06/22 1200  ceFEPIme (MAXIPIME) 2 g in sodium chloride 0.9 % 100 mL IVPB        2 g 200 mL/hr over 30 Minutes Intravenous Every 12 hours 09/06/22 0920     09/04/22 2200  metroNIDAZOLE (FLAGYL) tablet 500 mg        500 mg Oral Every 12 hours 09/04/22 0741     08/30/22 0830  ceFEPIme (MAXIPIME) 2 g in sodium chloride 0.9 % 100 mL IVPB  Status:  Discontinued        2 g 200 mL/hr over 30  Minutes Intravenous Every 8 hours 08/30/22 0826 09/06/22 0920   08/23/22 2100  ceFEPIme (MAXIPIME) 2 g in sodium chloride 0.9 % 100 mL IVPB  Status:  Discontinued        2 g 200 mL/hr over 30 Minutes Intravenous Every 12 hours 08/23/22 1357 08/30/22 0826   08/23/22 0400  micafungin (MYCAMINE) 100 mg in sodium chloride 0.9 % 100 mL IVPB        100 mg 105 mL/hr over 1 Hours Intravenous Daily 08/23/22 0242     08/22/22 1800  metroNIDAZOLE (FLAGYL) IVPB 500 mg  Status:  Discontinued        500 mg 100 mL/hr over 60 Minutes Intravenous Every 12 hours 08/22/22 1711 09/04/22 0741   08/22/22 1800  ceFEPIme (MAXIPIME) 2 g in sodium chloride 0.9 % 100 mL IVPB  Status:  Discontinued        2 g 200 mL/hr over 30 Minutes Intravenous Every 8 hours 08/22/22 1716 08/23/22 1357   08/22/22 1724  metroNIDAZOLE (FLAGYL) 500 MG/100ML IVPB       Note to Pharmacy: [name]: cabinet override      08/22/22 1724 08/23/22 0845   08/19/22 1030  cefTRIAXone (ROCEPHIN) 1 g in sodium chloride 0.9 % 100 mL IVPB  Status:  Discontinued  1 g 200 mL/hr over 30 Minutes Intravenous Every 24 hours 08/19/22 0937 08/22/22 1715       Assessment/Plan: POD 17 s/p exploratory laparotomy, right hemicolectomy, ileostomy, mucus fistula, biopsy of pelvic mass by Dr. 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 Margins negative. 0/43 lymph nodes.  - Discussed case with attending - given mass is cancerous, the concern is that this was not resectable during her initial operation. If this progresses and causes another obstruction I think her options would be limited surgically. Dr. Mosetta Putt of Oncology has seen and felt her cancer is likely incurable and her overall prognosis is poor. They will plan to see her back in 4-6 weeks to discuss options for chemo. Palliative is following - patient wishes to  proceed with full code/full scope - ileostomy 1350, will follow for now as just made changes. Cont BID fiber BID iron and BID Lomotil. Monitor output, Cr, electrolytes. Avoid sugary drinks.  - WOC for stoma teaching and assistance with pouching. - BID wet to dry to midline - CT 7/14 obtained for drain appearing feculent - this showed intra-abdominal fluid collections. IR consulted for drainage but felt the intra-abdominal fluid collections are small and look like post-op blood. Repeat CT 7/19 similar. S/p IR drain 7/20 with tubry cloudy fluid.  - Cx pending from IR drain. Cont abx - recommend ID consult for abx recommendations and duration.  - Cont JP drain - no longer feculent  - IR drain and flushes per IR.  - Mobilize, PT - rec for SNF    FEN: Soft diet. Shakes.  VTE: SCDs, Heparin gtt. Okay to change to DOAC ID: Cefepime/flagyl/micafungin Foley: Removed, Monitor. Urology following. S/p R PCN Dispo - As above   Per [facility] New stroke - Seen on MRI 7/10, suspected thromboembolic, defer to primary team/stroke team for further w/u and management.  PE - heparin drip per primary.  HTN HLD GERD COPD R hydro - s/p R PCN CT 7/14 also suggestion of splenic infarct and bilateral pleural effusions    Debra Sharp 09/08/2022

## 2022-09-08 NOTE — Progress Notes (Signed)
Pt refused all oral medications today stating multiple reasons including she was not ready, maybe later, and it was too many.  Son updated at bedside.

## 2022-09-08 NOTE — Progress Notes (Addendum)
ANTICOAGULATION CONSULT NOTE - Follow-up Note  Pharmacy Consult for Heparin Indication:  pulmonary embolus with recent stroke on MRI on 7/10  Allergies  Allergen Reactions   Iodinated Contrast Media Shortness Of Breath and Itching    Resolved with benadryl and solu-mederol. Consider premedication prior to future studies.   Aspirin Other (See Comments)    Caused an asthma attack   Ibuprofen Swelling and Other (See Comments)    Angioedema and Asthma exacerbation   Sulfonamide Derivatives Swelling and Other (See Comments)    Face swelled SEVERELY   Penicillins Rash    Patient Measurements: Height: 5' 5.98" (167.6 cm) Weight:  (bed not working) IBW/kg (Calculated) : 59.26 Heparin Dosing Weight: 75 kg  Vital Signs: Temp: 97.8 F (36.6 C) (07/23 0832) Temp Source: Oral (07/23 0832) BP: 118/62 (07/23 0832) Pulse Rate: 84 (07/23 0832)  Labs: Recent Labs    09/06/22 0103 09/07/22 0234 09/07/22 1600 09/08/22 0656  HGB 7.6* 8.0*  --  7.4*  HCT 25.9* 28.8*  --  25.0*  PLT 1,315* 1,239*  --  1,173*  HEPARINUNFRC 0.37 0.69 0.27* 0.38  CREATININE 1.18* 1.42*  --  1.37*    Estimated Creatinine Clearance: 40.3 mL/min (A) (by C-G formula based on SCr of 1.37 mg/dL (H)).   Medical History: Past Medical History:  Diagnosis Date   Anemia    Anxiety    Arthritis    Asthma    Blood transfusion without reported diagnosis    Cataract    removed bilateral   COPD (chronic obstructive pulmonary disease) (HCC)    Depression    GERD (gastroesophageal reflux disease)    Hyperlipidemia    Hypertension      Assessment: 72 yo W with a history of HTN, HLD, HF, thrombocytopenia, COPD, asthma found to have small subsegmental acute PE with acute and late acute CVA. No anticoagulation prior to admission. Pharmacy consulted for heparin.    Heparin level 0.38, therapeutic. Level drawn from peripheral line. Hgb low at 7.4, plts stable.  No issues with infusion or s/sx of bleeding per  RN.  Goal of Therapy:  Heparin level 0.3-0.5 units/ml Monitor platelets by anticoagulation protocol: Yes   Plan:   Continue heparin infusion at 1700 units/hr Follow-up HL with AM labs Monitor daily heparin level, CBC Monitor for signs/symptoms of bleeding   Nicole Kindred, PharmD PGY1 Pharmacy Resident 09/08/2022 8:46 AM

## 2022-09-08 NOTE — Progress Notes (Signed)
Speech Language Pathology Treatment: Dysphagia  Patient Details Name: Debra Sharp MRN: [id number] DOB: Aug 26, 1950 Today's Date: 09/08/2022 Time: 0272-5366 SLP Time Calculation (min) (ACUTE ONLY): 9 min  Assessment / Plan / Recommendation Clinical Impression  Pt has been advanced to regular texture solids since SLP last visit. Pt denies any difficulty other than reflux (although says that this is improved compared to how she was PTA). Question accuracy as pt also reports eating a lot of specific foods that per RN and RD, she has not been eating. However, pt did consume regular solids and thin liquids with skilled observation from SLP, with no overt signs of oropharyngeal dysphagia or aspiration noted. Recommend continuing with regular solids and thin liquids as tolerated. No further acute SLP f/u indicated for swallowing.    HPI HPI: 72 yo female admitted 7/2 with generalized weakness, thrombocytosis and anemia. 7/2 MRI scattered punctate ischemic infarcts right parietal, left frontal, right cerebellum. Pt with progression to AKI with septic peritonitis. 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 7/8 extubated. Lt sided weakness noted post extubation with CT demonstrating possible Rt parietal infarct. PMHx: COPD, GERD, anemia, CHF, HLD, HTN      SLP Plan  All goals met      Recommendations for follow up therapy are one component of a multi-disciplinary discharge planning process, led by the attending physician.  Recommendations may be updated based on patient status, additional functional criteria and insurance authorization.    Recommendations  Diet recommendations: Regular;Thin liquid Liquids provided via: Cup;Straw Medication Administration: Whole meds with liquid Supervision: Staff to assist with self feeding Compensations: Slow rate;Small sips/bites Postural Changes and/or Swallow Maneuvers: Seated upright 90  degrees;Upright 30-60 min after meal                  Oral care BID   Frequent or constant Supervision/Assistance Dysphagia, unspecified (R13.10)     All goals met     Mahala Menghini., M.A. CCC-SLP Acute Rehabilitation Services Office [phone]  Secure chat preferred   09/08/2022, 12:53 PM

## 2022-09-08 NOTE — Progress Notes (Signed)
PROGRESS NOTE    Debra Sharp  WUJ:[id number] DOB: [birth year] DOA: 08/18/2022 PCP: Clayborn Heron, MD   Brief Narrative:  This 72 year old female with past medical history of hypertension, hyperlipidemia, systolic CHF, thrombocytopenia, COPD, asthma who initially presented with weakness, abdominal pain. Workup showed anemia concerning for upper GI bleed. CT abdomen/pelvis showed colonic distention, possible obstruction. GI consulted, plan was for EGD but she could not tolerate prep. Also found to have right-sided hydronephrosis, incidental acute stroke on CT/MRI. Repeat CT abdomen/pelvis on 7/6 showed new intraperitoneal free air with presumed perforation of the colon. Surgery consulted and she underwent emergent exploratory laparotomy, right hemicolectomy, ileostomy with fistula, biopsy of pelvic mass. Patient was left intubated and was transferred to ICU for pain management. 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 Palliative care consulted. General surgery following. Currently on tube feeding. Hospital course also remarkable for finding of acute PE now on heparin drip. Now extubated and transferred to [facility] service on 7/12. Plan was for TEE which has been canceled because patient declined. Palliative care, urology ,surgery ,Neurology were following.     Currently titrating tube feeds.  Surgery following. Patient underwent new percutaneous drain in the right lower quadrant.  Assessment & Plan:   Principal Problem:   GI bleed Active Problems:   Symptomatic anemia   Thrombocytosis   Iron deficiency anemia   Abnormal CT of the abdomen   Colon distention   History of cardioembolic cerebrovascular accident (CVA)   Silent cerebral infarction (HCC)   Bowel perforation (HCC)   Ventilator dependent (HCC)   Pelvic mass   Change in bowel habits   Abnormal CT scan, gastrointestinal tract   Arterial hypotension   Septic shock (HCC)   Fecal occult blood test positive   Uterine  leiomyoma   Hydroureteronephrosis   Sepsis with acute hypoxic respiratory failure and septic shock (HCC)   Encephalopathy acute   Perforation bowel (HCC)   Malnutrition of moderate degree   Cerebrovascular accident (CVA) due to embolism of precerebral artery (HCC)   Acute metabolic encephalopathy   Delirium   Adenocarcinoma of small bowel (HCC)  Septic shock secondary to acute peritonitis: Colonic perforation due to obstructing pelvic mass:  Status post exploratory laparotomy with right hemicolectomy, ileostomy creation, mucus fistual formation, biopsy of pelvic mass, and intraoperative of vasculature using indocyanine green.   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   Currently on broad spectrum anti-infectives with micafungin, Flagyl, cefepime.  culture NGTD.   On tube feeds, will continue for now until she's able to maintain adequate PO intake by mouth. RD c/s for tube feeds.  General surgery following, appreciate recs.   IR had been consulted for drainage of intraabdominal fluid collections, but they thought intraabdominal fluid collections are small and appeared c/w post op blood Repeat CT scan being considered by surgery if worsening fever, leukocytosis, etc Leukocytosis persist.  CT 7/13 with small volume fluid within abdomen and pelvis with several collections demonstrating rim enhancement suggestive of abscesses, new, near complete hypoattenuation of splenic parenchyma with subcapsular low density fluid along peripheral margin of spleen, multiple mildly dilated smal bowel loops with numerous air fluid levels (post op ileus), severe R hydroureteronephrosis, new mild left hydronephrosis.  Small bilateral effusions.  Large complex mass occupying majority of low right pelvis.  Patient underwent new percutaneous drain in the RLQ, ID consulted to decide about antibiotics regimen at discharge.   Newly Diagnosed Terminal Ileum Adenocarcinoma with Peritoneal  Metastasis:  Culprit for colonic  perforation.  Initially concern for gynecological malignancy with elevated CA125, CA 19-19 but pathology showed small bowel primary.  Appreciate Dr. Latanya Maudlin recommendations.  Possible chemo after she recovers in 1-2 months.  ? Gyn follow up for debulking surgery of pelvic mass.  Overall prognosis is poor.  Recommending indefinite anticoagulation. Heparin is being switched to DOAC.   Chronic systolic CHF:  Echo on 08/18/2020 showed EF of 40 to 45%, global hypokinesis.  Previous echo on March 2024 showed EF of 30 to 35%.  We recommend follow-up with cardiology as an outpatient. She has bilateral LE edema - follow BNP Trial low dose lasix and follow.   Acute metabolic encephalopathy:  > Resolved. Likely from sepsis, delirium, CVAs.   -repeat CT brain neg for acute findings. She is much alert, following commands.    Acute CVA: MRI done on 7/10 with acute and late acute infarcts in multiple vascular territories consistent with central embolic source.   Appreciate neurology recs -> bilateral anterior/posterior infarcts with pattern more concerning for hypotension in setting of severe anemia and septic shock with hypotension (ddx includes hypercoagulable state with suspected malignancy).  Recommending transition to PO anticoagulation per primary team.  Atorvastatin 20 mg.  EEG done on 7/9 did not show any seizure.  There was plan for TEE but patient has declined. .  Recommended SNF on discharge. She has left-sided weakness. Hardly  able to get up from the bed.  Currently on heparin drip, will change to Eliquis when she does not need any more surgery and is able to tolerate po intake   Acute PE: CT angiogram showed acute PE.  Currently on heparin drip.  No evidence of right heart strain.  Will switch to Eliquis today.   AKI/hyperkalemia/hypophosphatemia:   Currently kidney function are stable. Phosphorus being supplemented.   Hydronephrosis secondary to abdominal mass:  Urology following.  Follow-up CT showed persistent severe right hydroureteronephrosis.  IR did  PCNL placement on 7/15 (pulled overnight 7/15) -urology saw pt 7/16--and still flushes easily. -mild left hydro will need to be followed outpatient.   Acute on chronic anemia of renal disease /acute blood loss anemia secondary to surgery/chronic GI bleed due to small bowel adenocarcinoma:  Monitor hemoglobin.  Transfuse if less than 7.  Currently on PPI. Relatively stable.    Hypertension: Off hypertensives. BP stable.  Monitor blood pressure   Hyperlipidemia: Currently on Lipitor   Hyperglycemia: A1c 5.4, d/c SSI Currently on sliding scale insulin.     Left upper extremity edema:  Negative for DVT  Thrombocytosis: Likely secondary to above. Patient is already on IV heparin.   Goals of care: Multiple comorbidities.  Palliative care were following.  She's currently full code/full scope of care.   DVT prophylaxis: Heparin gtt Code Status: Full code Family Communication: No family at bed side. Disposition Plan:   Status is: Inpatient Remains inpatient appropriate because:  Need for continued inpatient care.    Consultants:  Surgery Urology palliative  Procedures:  Echo EEG LUE duplex  Antimicrobials:  Anti-infectives (From admission, onward)    Start     Dose/Rate Route Frequency Ordered Stop   09/06/22 1200  ceFEPIme (MAXIPIME) 2 g in sodium chloride 0.9 % 100 mL IVPB        2 g 200 mL/hr over 30 Minutes Intravenous Every 12 hours 09/06/22 0920     09/04/22 2200  metroNIDAZOLE (FLAGYL) tablet 500 mg        500 mg Oral Every 12 hours 09/04/22 0741  08/30/22 0830  ceFEPIme (MAXIPIME) 2 g in sodium chloride 0.9 % 100 mL IVPB  Status:  Discontinued        2 g 200 mL/hr over 30 Minutes Intravenous Every 8 hours 08/30/22 0826 09/06/22 0920   08/23/22 2100  ceFEPIme (MAXIPIME) 2 g in sodium chloride 0.9 % 100 mL IVPB  Status:  Discontinued        2 g 200 mL/hr over 30  Minutes Intravenous Every 12 hours 08/23/22 1357 08/30/22 0826   08/23/22 0400  micafungin (MYCAMINE) 100 mg in sodium chloride 0.9 % 100 mL IVPB  Status:  Discontinued        100 mg 105 mL/hr over 1 Hours Intravenous Daily 08/23/22 0242 09/08/22 1029   08/22/22 1800  metroNIDAZOLE (FLAGYL) IVPB 500 mg  Status:  Discontinued        500 mg 100 mL/hr over 60 Minutes Intravenous Every 12 hours 08/22/22 1711 09/04/22 0741   08/22/22 1800  ceFEPIme (MAXIPIME) 2 g in sodium chloride 0.9 % 100 mL IVPB  Status:  Discontinued        2 g 200 mL/hr over 30 Minutes Intravenous Every 8 hours 08/22/22 1716 08/23/22 1357   08/22/22 1724  metroNIDAZOLE (FLAGYL) 500 MG/100ML IVPB       Note to Pharmacy: [name]: cabinet override      08/22/22 1724 08/23/22 0845   08/19/22 1030  cefTRIAXone (ROCEPHIN) 1 g in sodium chloride 0.9 % 100 mL IVPB  Status:  Discontinued        1 g 200 mL/hr over 30 Minutes Intravenous Every 24 hours 08/19/22 0937 08/22/22 1715       Subjective: Patient was seen and examined at bed side, overnight events noted,  Patient reports doing much better.  She reports pain is reasonably controlled. She has colostomy with brownish stool noted.  Patient reports pain is reasonably controlled.  Objective: Vitals:   09/07/22 2306 09/08/22 0321 09/08/22 0718 09/08/22 0832  BP: 130/64 (!) 152/51  118/62  Pulse: 98 95 82 84  Resp: 13 16 16 10   Temp: 97.9 F (36.6 C) 98.2 F (36.8 C)  97.8 F (36.6 C)  TempSrc: Oral Oral  Oral  SpO2: 98% 97%  99%  Weight:      Height:        Intake/Output Summary (Last 24 hours) at 09/08/2022 1153 Last data filed at 09/08/2022 0643 Gross per 24 hour  Intake 609 ml  Output 2431 ml  Net -1822 ml   Filed Weights   09/07/22 0301  Weight: 78 kg    Examination:  General exam: Appears calm and comfortable, very deconditioned, not in any acute distress. Respiratory system: CTA bilaterally. Respiratory effort normal. RR 15 Cardiovascular  system: S1 & S2 heard, RRR. No JVD, murmurs, rubs, gallops or clicks. No pedal edema. Gastrointestinal system: Abdomen is soft, nondistended, mildly tender. Colostomy noted with brown colored stool in bag. Normal bowel sounds heard. Central nervous system: Alert and oriented x 3. No focal neurological deficits. Extremities: Symmetric 5 x 5 power. Skin: No rashes, lesions or ulcers Psychiatry: Judgement and insight appear normal. Mood & affect appropriate.     Data Reviewed: I have personally reviewed following labs and imaging studies  CBC: Recent Labs  Lab 09/04/22 0405 09/05/22 0037 09/06/22 0103 09/07/22 0234 09/08/22 0656  WBC 17.1* 20.3* 16.3* 15.4* 15.8*  HGB 8.5* 8.2* 7.6* 8.0* 7.4*  HCT 27.9* 27.4* 25.9* 28.8* 25.0*  MCV 82.3 81.5 82.7 87.0 84.2  PLT 1,254* 1,279* 1,315* 1,239* 1,173*   Basic Metabolic Panel: Recent Labs  Lab 09/03/22 0729 09/04/22 0405 09/06/22 0103 09/07/22 0234 09/08/22 0656  NA 133* 133* 134* 134* 136  K 3.6 3.6 3.6 4.5 4.1  CL 110 111 113* 112* 115*  CO2 15* 17* 15* 14* 16*  GLUCOSE 110* 100* 126* 134* 107*  BUN 21 23 39* 43* 47*  CREATININE 0.78 0.93 1.18* 1.42* 1.37*  CALCIUM 7.7* 8.0* 8.5* 8.9 9.2  MG 1.8 1.7 1.7  --  1.7  PHOS 4.1 4.2 3.5  --  3.1   GFR: Estimated Creatinine Clearance: 40.3 mL/min (A) (by C-G formula based on SCr of 1.37 mg/dL (H)). Liver Function Tests: Recent Labs  Lab 09/04/22 0405  AST 18  ALT 11  ALKPHOS 71  BILITOT 0.1*  PROT 4.6*  ALBUMIN 1.5*   No results for input(s): "LIPASE", "AMYLASE" in the last 168 hours. No results for input(s): "AMMONIA" in the last 168 hours. Coagulation Profile: Recent Labs  Lab 09/05/22 0037  INR 1.2   Cardiac Enzymes: No results for input(s): "CKTOTAL", "CKMB", "CKMBINDEX", "TROPONINI" in the last 168 hours. BNP (last 3 results) No results for input(s): "PROBNP" in the last 8760 hours. HbA1C: No results for input(s): "HGBA1C" in the last 72 hours. CBG: Recent  Labs  Lab 09/06/22 1636 09/06/22 2014 09/06/22 2356 09/07/22 0425 09/07/22 0741  GLUCAP 94 106* 108* 122* 102*   Lipid Profile: No results for input(s): "CHOL", "HDL", "LDLCALC", "TRIG", "CHOLHDL", "LDLDIRECT" in the last 72 hours. Thyroid Function Tests: No results for input(s): "TSH", "T4TOTAL", "FREET4", "T3FREE", "THYROIDAB" in the last 72 hours. Anemia Panel: No results for input(s): "VITAMINB12", "FOLATE", "FERRITIN", "TIBC", "IRON", "RETICCTPCT" in the last 72 hours.  Sepsis Labs: No results for input(s): "PROCALCITON", "LATICACIDVEN" in the last 168 hours.  Recent Results (from the past 240 hour(s))  Body fluid culture w Gram Stain     Status: None   Collection Time: 08/31/22  5:03 PM   Specimen: Urine, Random; Body Fluid  Result Value Ref Range Status   Specimen Description URINE, RANDOM  Final   Special Requests Normal  Final   Gram Stain   Final    WBC PRESENT, PREDOMINANTLY MONONUCLEAR NO ORGANISMS SEEN CYTOSPIN SMEAR    Culture   Final    NO GROWTH 3 DAYS Performed at [facility], [address] [place], [place] [address]    Report Status 09/03/2022 FINAL  Final  Aerobic/Anaerobic Culture w Gram Stain (surgical/deep wound)     Status: None (Preliminary result)   Collection Time: 09/05/22 12:14 PM   Specimen: Abscess  Result Value Ref Range Status   Specimen Description ABSCESS  Final   Special Requests NONE  Final   Gram Stain   Final    ABUNDANT WBC PRESENT, PREDOMINANTLY PMN ABUNDANT GRAM POSITIVE RODS FEW GRAM NEGATIVE RODS RARE GRAM POSITIVE COCCI    Culture   Final    FEW PSEUDOMONAS AERUGINOSA MODERATE STREPTOCOCCUS ANGINOSIS    Report Status PENDING  Incomplete   Organism ID, Bacteria PSEUDOMONAS AERUGINOSA  Final      Susceptibility   Pseudomonas aeruginosa - MIC*    CEFTAZIDIME 2 SENSITIVE Sensitive     CIPROFLOXACIN <=0.25 SENSITIVE Sensitive     GENTAMICIN <=1 SENSITIVE Sensitive     IMIPENEM 1 SENSITIVE Sensitive      PIP/TAZO <=4 SENSITIVE Sensitive     CEFEPIME Value in next row Sensitive      2 SENSITIVEPerformed at  [facility], [address] [place], [place], [place] [address]    * FEW PSEUDOMONAS AERUGINOSA   Radiology Studies: No results found.  Scheduled Meds:  acetaminophen  650 mg Oral Q6H   arformoterol  15 mcg Nebulization BID   ascorbic acid  500 mg Oral Daily   atorvastatin  20 mg Oral Daily   budesonide (PULMICORT) nebulizer solution  0.5 mg Nebulization BID   Chlorhexidine Gluconate Cloth  6 each Topical Daily   diphenoxylate-atropine  1 tablet Oral BID   feeding supplement  237 mL Oral TID BM   ferrous sulfate  325 mg Oral BID WC   loperamide  2 mg Oral QID   melatonin  3 mg Oral QHS   metroNIDAZOLE  500 mg Oral Q12H   multivitamin with minerals  1 tablet Oral Daily   pantoprazole (PROTONIX) IV  40 mg Intravenous Q12H   polycarbophil  625 mg Oral BID   revefenacin  175 mcg Nebulization Daily   sertraline  150 mg Oral Daily   sodium bicarbonate  650 mg Oral TID   sodium chloride flush  10-40 mL Intracatheter Q12H   sodium chloride flush  5 mL Intracatheter Q8H   tamsulosin  0.4 mg Oral Daily   zinc sulfate  220 mg Oral Daily   Continuous Infusions:  sodium chloride 250 mL (09/04/22 2000)   ceFEPime (MAXIPIME) IV Stopped (09/08/22 0036)   heparin 1,700 Units/hr (09/07/22 2214)   promethazine (PHENERGAN) injection (IM or IVPB)       LOS: 21 days    Time spent: 35 mins    Willeen Niece, MD [facility]   If 7PM-7AM, please contact night-coverage

## 2022-09-09 DIAGNOSIS — K922 Gastrointestinal hemorrhage, unspecified: Secondary | ICD-10-CM | POA: Diagnosis not present

## 2022-09-09 LAB — CBC
HCT: 25.7 % — ABNORMAL LOW (ref 36.0–46.0)
Hemoglobin: 7.5 g/dL — ABNORMAL LOW (ref 12.0–15.0)
MCH: 25.3 pg — ABNORMAL LOW (ref 26.0–34.0)
MCHC: 29.2 g/dL — ABNORMAL LOW (ref 30.0–36.0)
MCV: 86.5 fL (ref 80.0–100.0)
Platelets: 1172 10*3/uL (ref 150–400)
RBC: 2.97 MIL/uL — ABNORMAL LOW (ref 3.87–5.11)
RDW: 26.9 % — ABNORMAL HIGH (ref 11.5–15.5)
WBC: 15.7 10*3/uL — ABNORMAL HIGH (ref 4.0–10.5)
nRBC: 0 % (ref 0.0–0.2)

## 2022-09-09 LAB — BASIC METABOLIC PANEL
Anion gap: 8 (ref 5–15)
BUN: 49 mg/dL — ABNORMAL HIGH (ref 8–23)
CO2: 14 mmol/L — ABNORMAL LOW (ref 22–32)
Calcium: 10 mg/dL (ref 8.9–10.3)
Chloride: 113 mmol/L — ABNORMAL HIGH (ref 98–111)
Creatinine, Ser: 1.49 mg/dL — ABNORMAL HIGH (ref 0.44–1.00)
GFR, Estimated: 38 mL/min — ABNORMAL LOW (ref >60)
Glucose, Bld: 91 mg/dL (ref 70–99)
Potassium: 4.2 mmol/L (ref 3.5–5.1)
Sodium: 135 mmol/L (ref 135–145)

## 2022-09-09 LAB — MAGNESIUM: Magnesium: 1.7 mg/dL (ref 1.7–2.4)

## 2022-09-09 LAB — PHOSPHORUS: Phosphorus: 4.2 mg/dL (ref 2.5–4.6)

## 2022-09-09 MED ORDER — LOPERAMIDE HCL 2 MG PO CAPS
2.0000 mg | ORAL_CAPSULE | Freq: Two times a day (BID) | ORAL | Status: DC
  Administered 2022-09-09 – 2022-09-12 (×4): 2 mg via ORAL
  Filled 2022-09-09 (×7): qty 1

## 2022-09-09 NOTE — Discharge Instructions (Signed)
Information on my medicine - ELIQUIS® (apixaban) ° °This medication education was reviewed with me or my healthcare representative as part of my discharge preparation. ° °Why was Eliquis® prescribed for you? °Eliquis® was prescribed to treat blood clots that may have been found in the veins of your legs (deep vein thrombosis) or in your lungs (pulmonary embolism) and to reduce the risk of them occurring again. ° °What do You need to know about Eliquis® ? °The dose is ONE 5 mg tablet taken TWICE daily.  Eliquis® may be taken with or without food.  ° °Try to take the dose about the same time in the morning and in the evening. If you have difficulty swallowing the tablet whole please discuss with your pharmacist how to take the medication safely. ° °Take Eliquis® exactly as prescribed and DO NOT stop taking Eliquis® without talking to the doctor who prescribed the medication.  Stopping may increase your risk of developing a new blood clot.  Refill your prescription before you run out. ° °After discharge, you should have regular check-up appointments with your healthcare provider that is prescribing your Eliquis®. °   °What do you do if you miss a dose? °If a dose of ELIQUIS® is not taken at the scheduled time, take it as soon as possible on the same day and twice-daily administration should be resumed. The dose should not be doubled to make up for a missed dose. ° °Important Safety Information °A possible side effect of Eliquis® is bleeding. You should call your healthcare provider right away if you experience any of the following: °? Bleeding from an injury or your nose that does not stop. °? Unusual colored urine (red or dark brown) or unusual colored stools (red or black). °? Unusual bruising for unknown reasons. °? A serious fall or if you hit your head (even if there is no bleeding). ° °Some medicines may interact with Eliquis® and might increase your risk of bleeding or clotting while on Eliquis®. To help avoid  this, consult your healthcare provider or pharmacist prior to using any new prescription or non-prescription medications, including herbals, vitamins, non-steroidal anti-inflammatory drugs (NSAIDs) and supplements. ° °This website has more information on Eliquis® (apixaban): http://www.eliquis.com/eliquis/home ° °

## 2022-09-09 NOTE — Progress Notes (Signed)
Pharmacy Antibiotic Note  Debra Sharp is a 72 y.o. female admitted on 08/18/2022 with septic shock/colonic perforation secondary to obstructive pelvis mass, UTI. Pharmacy has been consulted for Cefepime dosing. Day 17 of broad spectrum abx - ID following. Leukocytosis, afebrile. Scr 1.49.  Micro results: 7/20 fluid>> few pseudomonas (panS) & moderate streptococcus anginosis (PanS except erythro) 7/15 Ucx: NgF 7/6 BCx - negativeF 7/6 MRSA PCR - negative  Plan: Continue Cefepime 2g IV Q12H Continue Flagyl 500mg  IV Q12H Monitor renal function & abx length of therapy  Height: 5' 5.98" (167.6 cm) Weight:  (bed does not work) IBW/kg (Calculated) : 59.26  Temp (24hrs), Avg:98 F (36.7 C), Min:97.7 F (36.5 C), Max:98.3 F (36.8 C)  Recent Labs  Lab 09/04/22 0405 09/05/22 0037 09/06/22 0103 09/07/22 0234 09/08/22 0656 09/09/22 0340  WBC 17.1* 20.3* 16.3* 15.4* 15.8* 15.7*  CREATININE 0.93  --  1.18* 1.42* 1.37* 1.49*    Estimated Creatinine Clearance: 37 mL/min (A) (by C-G formula based on SCr of 1.49 mg/dL (H)).    Allergies  Allergen Reactions   Iodinated Contrast Media Shortness Of Breath and Itching    Resolved with benadryl and solu-mederol. Consider premedication prior to future studies.   Aspirin Other (See Comments)    Caused an asthma attack   Ibuprofen Swelling and Other (See Comments)    Angioedema and Asthma exacerbation   Sulfonamide Derivatives Swelling and Other (See Comments)    Face swelled SEVERELY   Penicillins Rash   Thank you for involving pharmacy in the patient's care.   Nicole Kindred, PharmD PGY1 Pharmacy Resident 09/09/2022 10:00 AM

## 2022-09-09 NOTE — Progress Notes (Signed)
Occupational Therapy Treatment Patient Details Name: Debra Sharp MRN: [id number] DOB: November 28, 1950 Today's Date: 09/09/2022   History of present illness 72 yo female admitted 7/2 with generalized weakness, thrombocytosis and anemia. 7/2 MRI scattered punctate ischemic infarcts right parietal, left frontal, right cerebellum. Pt with progression to AKI with septic peritonitis. 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 7/8 extubated. Lt sided weakness noted post extubation with CT demonstrating possible Rt parietal infarct. PMHx: COPD, anemia, CHF, HLD, HTN   OT comments  Pt supine on arrival and benefiting from encouragement to participate in session due to "pain" after prior sessions. Education provided regarding reason for pain and pt agreeable to session better meeting activity tolerance level and OT agreeable to coordinate pain medication request with RN. Challenging activity tolerance, strength and balance with bed mobility, sitting EOB and transfer with Stedy with +2 total A. Pt LUE with no AROM noted and unable to grasp stedy bar. Have requested RN to order sling for use with transfers to optimize joint integrity. Patient will benefit from continued inpatient follow up therapy, <3 hours/day    Recommendations for follow up therapy are one component of a multi-disciplinary discharge planning process, led by the attending physician.  Recommendations may be updated based on patient status, additional functional criteria and insurance authorization.    Assistance Recommended at Discharge Frequent or constant Supervision/Assistance  Patient can return home with the following  Two people to help with walking and/or transfers;Two people to help with bathing/dressing/bathroom;Assistance with cooking/housework;Direct supervision/assist for medications management;Direct supervision/assist for financial management;Assist for  transportation;Help with stairs or ramp for entrance   Equipment Recommendations  Other (comment)    Recommendations for Other Services      Precautions / Restrictions Precautions Precautions: Fall;Other (comment) Precaution Comments: drain, nephrostomy bag, ostomy, picc line Restrictions Weight Bearing Restrictions: No       Mobility Bed Mobility Overal bed mobility: Needs Assistance Bed Mobility: Supine to Sit, Sit to Supine, Rolling Rolling: Total assist   Supine to sit: Total assist, +2 for physical assistance Sit to supine: Total assist, +2 for physical assistance   General bed mobility comments: assist for all aspect of mobility    Transfers Overall transfer level: Needs assistance   Transfers: Sit to/from Stand Sit to Stand: Total assist, +2 physical assistance           General transfer comment: STS x 2 trials in stedy. Able to clear bottom from bed but maintaining a squatted position. PT/OT providing assist at gait belt and pad under hips. Total assist to maintain safe position of LUE due to flaccidity. 5 sec static stand/trial.     Balance Overall balance assessment: Needs assistance Sitting-balance support: Single extremity supported, Feet supported Sitting balance-Leahy Scale: Zero Sitting balance - Comments: max assist to maintain balance EOB   Standing balance support: During functional activity Standing balance-Leahy Scale: Zero Standing balance comment: +2 total assist sit to stand trial                           ADL either performed or assessed with clinical judgement   ADL Overall ADL's : Needs assistance/impaired                     Lower Body Dressing: Total assistance;+2 for safety/equipment;+2 for physical assistance Lower Body Dressing Details (indicate cue type and reason): to don socks Toilet Transfer: Total assistance;+2  for physical assistance;+2 for safety/equipment Toilet Transfer Details (indicate cue type and  reason): with stedy         Functional mobility during ADLs: Total assistance;+2 for physical assistance;+2 for safety/equipment General ADL Comments: Focus session on transfer training 2x    Extremity/Trunk Assessment Upper Extremity Assessment Upper Extremity Assessment: RUE deficits/detail;LUE deficits/detail RUE Deficits / Details: Able to reach up to stedy bar with RUE and sustain grasp during transfer. LUE Deficits / Details: No AROM noted   Lower Extremity Assessment Lower Extremity Assessment: Defer to PT evaluation        Vision       Perception     Praxis      Cognition Arousal/Alertness: Awake/alert Behavior During Therapy: WFL for tasks assessed/performed Overall Cognitive Status: Impaired/Different from baseline Area of Impairment: Attention, Memory, Following commands, Safety/judgement, Awareness, Problem solving                   Current Attention Level: Sustained Memory: Decreased short-term memory Following Commands: Follows one step commands inconsistently, Follows one step commands with increased time Safety/Judgement: Decreased awareness of safety, Decreased awareness of deficits Awareness: Intellectual Problem Solving: Slow processing, Decreased initiation, Difficulty sequencing, Requires verbal cues, Requires tactile cues General Comments: Cooperative with encouragement. Follows one step commands during session once encouraged and agreeable to participate. Poor insight        Exercises      Shoulder Instructions       General Comments HR into 120s. Pt began vomitting while sitting EOB, after 2nd standing trial. due to total A for management of LUE, will be recomme,nding sling for safety and joint integrity of arm during transfer training    Pertinent Vitals/ Pain       Pain Assessment Pain Assessment: Faces Faces Pain Scale: Hurts a little bit Pain Location: generalized with mobility Pain Descriptors / Indicators: Discomfort,  Grimacing Pain Intervention(s): Limited activity within patient's tolerance, Monitored during session  Home Living                                          Prior Functioning/Environment              Frequency  Min 2X/week        Progress Toward Goals  OT Goals(current goals can now be found in the care plan section)  Progress towards OT goals: Progressing toward goals  Acute Rehab OT Goals OT Goal Formulation: With patient Time For Goal Achievement: 09/09/22 Potential to Achieve Goals: Fair ADL Goals Pt Will Perform Grooming: sitting;with min assist Pt Will Perform Upper Body Bathing: with mod assist;sitting Pt Will Transfer to Toilet: stand pivot transfer;with mod assist;with +2 assist;bedside commode Additional ADL Goal #1: Pt will follow all one step commands with increased time. Additional ADL Goal #2: Pt will perform bed mobility with mod A.  Plan Discharge plan remains appropriate;Frequency remains appropriate    Co-evaluation    PT/OT/SLP Co-Evaluation/Treatment: Yes Reason for Co-Treatment: For patient/therapist safety;To address functional/ADL transfers;Complexity of the patient's impairments (multi-system involvement) PT goals addressed during session: Mobility/safety with mobility;Balance;Proper use of DME OT goals addressed during session: ADL's and self-care;Proper use of Adaptive equipment and DME      AM-PAC OT "6 Clicks" Daily Activity     Outcome Measure   Help from another person eating meals?: Total Help from another person taking care of personal grooming?: A  Lot Help from another person toileting, which includes using toliet, bedpan, or urinal?: Total Help from another person bathing (including washing, rinsing, drying)?: Total Help from another person to put on and taking off regular upper body clothing?: A Lot Help from another person to put on and taking off regular lower body clothing?: Total 6 Click Score: 8    End  of Session    OT Visit Diagnosis: Unsteadiness on feet (R26.81);Muscle weakness (generalized) (M62.81);Other symptoms and signs involving cognitive function   Activity Tolerance Patient tolerated treatment well   Patient Left in bed;with call bell/phone within reach;with bed alarm set   Nurse Communication Mobility status        Time: 0920-0950 OT Time Calculation (min): 30 min  Charges: OT General Charges $OT Visit: 1 Visit OT Treatments $Therapeutic Activity: 8-22 mins  Myrla Halsted, OTD, OTR/L [facility] Acute Rehabilitation Office: [phone]   Myrla Halsted 09/09/2022, 2:02 PM

## 2022-09-09 NOTE — Progress Notes (Signed)
Physical Therapy Treatment Patient Details Name: Debra Sharp MRN: [id number] DOB: 18-May-1950 Today's Date: 09/09/2022   History of Present Illness 72 yo female admitted 7/2 with generalized weakness, thrombocytosis and anemia. 7/2 MRI scattered punctate ischemic infarcts right parietal, left frontal, right cerebellum. Pt with progression to AKI with septic peritonitis. 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 7/8 extubated. Lt sided weakness noted post extubation with CT demonstrating possible Rt parietal infarct. PMHx: COPD, anemia, CHF, HLD, HTN    PT Comments  Pt received in bed, initially declining participation due to reports of pain following therapy. With encouragement, pt agreeable. Pt seen in session with OT to address mobility progression OOB. She required +2 total assist bed mobility, and +2 total assist standing trials in stedy. Pt able to clear bottom from bed but maintaining squatted position. Therapists providing assist at gait belt and pad under hips. 5 second static stand/trial. Pt began vomiting after 2nd standing trial requiring return to bed. Total assist required to maintain safe position of LUE during mobility due to flaccidity.      Assistance Recommended at Discharge Frequent or constant Supervision/Assistance  If plan is discharge home, recommend the following:  Can travel by private vehicle    Two people to help with walking and/or transfers;Two people to help with bathing/dressing/bathroom;Assistance with cooking/housework;Assistance with feeding;Direct supervision/assist for medications management;Assist for transportation;Direct supervision/assist for financial management   No  Equipment Recommendations  Wheelchair (measurements PT);Hospital bed;Wheelchair cushion (measurements PT);Other (comment)    Recommendations for Other Services       Precautions / Restrictions  Precautions Precautions: Fall;Other (comment) Precaution Comments: drain, nephrostomy bag, ostomy, picc line     Mobility  Bed Mobility Overal bed mobility: Needs Assistance Bed Mobility: Supine to Sit, Sit to Supine, Rolling Rolling: Total assist   Supine to sit: Total assist, +2 for physical assistance Sit to supine: Total assist, +2 for physical assistance   General bed mobility comments: assist for all aspect of mobility    Transfers Overall transfer level: Needs assistance   Transfers: Sit to/from Stand Sit to Stand: Total assist, +2 physical assistance           General transfer comment: STS x 2 trials in stedy. Able to clear bottom from bed but maintaining a squatted position. PT/OT providing assist at gait belt and pad under hips. Total assist to maintain safe position of LUE due to flaccidity. 5 sec static stand/trial.    Ambulation/Gait               General Gait Details: nonambulatory   Stairs             Wheelchair Mobility     Tilt Bed    Modified Rankin (Stroke Patients Only)       Balance Overall balance assessment: Needs assistance Sitting-balance support: Single extremity supported, Feet supported Sitting balance-Leahy Scale: Zero Sitting balance - Comments: max assist to maintain balance EOB   Standing balance support: During functional activity Standing balance-Leahy Scale: Zero Standing balance comment: +2 total assist sit to stand trial                            Cognition Arousal/Alertness: Awake/alert Behavior During Therapy: WFL for tasks assessed/performed Overall Cognitive Status: Impaired/Different from baseline Area of Impairment: Attention, Memory, Following commands, Safety/judgement, Awareness, Problem solving  Current Attention Level: Sustained Memory: Decreased short-term memory Following Commands: Follows one step commands inconsistently, Follows one step commands with  increased time Safety/Judgement: Decreased awareness of safety, Decreased awareness of deficits Awareness: Intellectual Problem Solving: Slow processing, Decreased initiation, Difficulty sequencing, Requires verbal cues, Requires tactile cues General Comments: Cooperative        Exercises      General Comments General comments (skin integrity, edema, etc.): HR into 120s. Pt began vomitting while sitting EOB, after 2nd standing trial.      Pertinent Vitals/Pain Pain Assessment Pain Assessment: Faces Faces Pain Scale: Hurts a little bit Pain Location: generalized with mobility Pain Descriptors / Indicators: Discomfort, Grimacing Pain Intervention(s): Monitored during session    Home Living                          Prior Function            PT Goals (current goals can now be found in the care plan section) Acute Rehab PT Goals PT Goal Formulation: Patient unable to participate in goal setting Time For Goal Achievement: 09/11/22 Progress towards PT goals: Progressing toward goals    Frequency    Min 2X/week      PT Plan Current plan remains appropriate    Co-evaluation PT/OT/SLP Co-Evaluation/Treatment: Yes Reason for Co-Treatment: For patient/therapist safety;To address functional/ADL transfers;Complexity of the patient's impairments (multi-system involvement) PT goals addressed during session: Mobility/safety with mobility;Balance;Proper use of DME        AM-PAC PT "6 Clicks" Mobility   Outcome Measure  Help needed turning from your back to your side while in a flat bed without using bedrails?: Total Help needed moving from lying on your back to sitting on the side of a flat bed without using bedrails?: Total Help needed moving to and from a bed to a chair (including a wheelchair)?: Total Help needed standing up from a chair using your arms (e.g., wheelchair or bedside chair)?: Total Help needed to walk in hospital room?: Total Help needed  climbing 3-5 steps with a railing? : Total 6 Click Score: 6    End of Session Equipment Utilized During Treatment: Gait belt Activity Tolerance: Patient limited by fatigue;Treatment limited secondary to medical complications (Comment) (N&V) Patient left: in bed;with call bell/phone within reach;with family/visitor present Nurse Communication: Mobility status;Other (comment) (Pt vomitting.) PT Visit Diagnosis: Other abnormalities of gait and mobility (R26.89);Muscle weakness (generalized) (M62.81);Other symptoms and signs involving the nervous system (R29.898);Hemiplegia and hemiparesis Hemiplegia - Right/Left: Left Hemiplegia - dominant/non-dominant: Non-dominant Hemiplegia - caused by: Cerebral infarction     Time: 0865-7846 PT Time Calculation (min) (ACUTE ONLY): 30 min  Charges:    $Therapeutic Activity: 8-22 mins PT General Charges $$ ACUTE PT VISIT: 1 Visit                     Ferd Glassing., PT  Office # [phone]    Ilda Foil 09/09/2022, 11:09 AM

## 2022-09-09 NOTE — Progress Notes (Signed)
   18 Days Post-Op Subjective: NAEON. Pt alert and responsive. Son was at bedside. Both parties updated and questions were answered to their satisfaction.   Objective: Vital signs in last 24 hours: Temp:  [97.7 F (36.5 C)-98.3 F (36.8 C)] 98.1 F (36.7 C) (07/24 0817) Pulse Rate:  [78-98] 88 (07/24 0832) Resp:  [13-17] 14 (07/24 0832) BP: (126-155)/(55-61) 135/58 (07/24 0817) SpO2:  [98 %-100 %] 100 % (07/24 0817) FiO2 (%):  [21 %] 21 % (07/23 2000)  Intake/Output from previous day: 07/23 0701 - 07/24 0700 In: 100 [IV Piggyback:100] Out: 1435 [Urine:1310; Drains:25; Stool:100]  Intake/Output this shift: No intake/output data recorded.  Physical Exam:  General: Alert and oriented CV: No cyanosis Lungs: equal chest rise Gu: Pure wick catheter  Lab Results: Recent Labs    09/07/22 0234 09/08/22 0656 09/09/22 0340  HGB 8.0* 7.4* 7.5*  HCT 28.8* 25.0* 25.7*   BMET Recent Labs    09/08/22 0656 09/09/22 0340  NA 136 135  K 4.1 4.2  CL 115* 113*  CO2 16* 14*  GLUCOSE 107* 91  BUN 47* 49*  CREATININE 1.37* 1.49*  CALCIUM 9.2 10.0     Studies/Results: No results found.  Assessment/Plan: # Severe right hydronephrosis 2/2 abdominal mass # Terminal ileum adenocarcinoma w/ peritoneal metastasis.  #AKI  Scr 1.49. Stable. Trend labs  S/p bowel perf and hemicolectomy.   Repeat renal ultrasound was collected 7/11.  Right side hydronephrosis was unchanged.  No plan for further abd surgery for many months. R. PCNT 55f placed 7/15 to preserve kidney  Still significant hematuria from R. PCNT. Now BID Eliquis in treatment of acute PE. I doubt that she can come off of anticoagulation in her hypercoagulable state, but if there is a safe window, she would benefit from enough of a holiday to allow her bleeding to at least lessen, if not stop.   Urology will continue to follow along peripherally    LOS: 22 days   Elmon Kirschner, NP [facility] Pager: [phone]  09/09/2022, 10:55 AM

## 2022-09-09 NOTE — Progress Notes (Signed)
18 Days Post-Op   Subjective/Chief Complaint: Her son is at bedside.  Patient orientated to self, place and situation. Not orientated to time.  No abdominal pain. Ate lunch without n/v. Didn't eat dinner last night. Having ostomty output - much thicker and down to 100cc/24 hours.   Afebrile. No tachycardia or systolic hypotension. WBC 15.7 from 15.8.   Objective: Vital signs in last 24 hours: Temp:  [97.7 F (36.5 C)-98.3 F (36.8 C)] 98.1 F (36.7 C) (07/24 0817) Pulse Rate:  [78-98] 88 (07/24 0832) Resp:  [13-17] 14 (07/24 0832) BP: (126-155)/(55-61) 135/58 (07/24 0817) SpO2:  [98 %-100 %] 100 % (07/24 0817) FiO2 (%):  [21 %] 21 % (07/23 2000) Last BM Date : 09/08/22  Intake/Output from previous day: 07/23 0701 - 07/24 0700 In: 100 [IV Piggyback:100] Out: 1435 [Urine:1310; Drains:25; Stool:100] Intake/Output this shift: No intake/output data recorded.  Gen: Alert, NAD, pleasant Abd: Soft, mild distension, NT. Stoma and mucus fistula viable - ostomy appliance with thick but soft brown stool in eakins bag.  Midline wound with some fibrinous tissue at the base but otherwise clean. L surgical JP drain with serous output with some debris. IR drain more clear  Lab Results:  Recent Labs    09/08/22 0656 09/09/22 0340  WBC 15.8* 15.7*  HGB 7.4* 7.5*  HCT 25.0* 25.7*  PLT 1,173* 1,172*   BMET Recent Labs    09/08/22 0656 09/09/22 0340  NA 136 135  K 4.1 4.2  CL 115* 113*  CO2 16* 14*  GLUCOSE 107* 91  BUN 47* 49*  CREATININE 1.37* 1.49*  CALCIUM 9.2 10.0   PT/INR No results for input(s): "LABPROT", "INR" in the last 72 hours. ABG No results for input(s): "PHART", "HCO3" in the last 72 hours.  Invalid input(s): "PCO2", "PO2"  Studies/Results: No results found.  Anti-infectives: Anti-infectives (From admission, onward)    Start     Dose/Rate Route Frequency Ordered Stop   09/06/22 1200  ceFEPIme (MAXIPIME) 2 g in sodium chloride 0.9 % 100 mL IVPB        2  g 200 mL/hr over 30 Minutes Intravenous Every 12 hours 09/06/22 0920     09/04/22 2200  metroNIDAZOLE (FLAGYL) tablet 500 mg        500 mg Oral Every 12 hours 09/04/22 0741     08/30/22 0830  ceFEPIme (MAXIPIME) 2 g in sodium chloride 0.9 % 100 mL IVPB  Status:  Discontinued        2 g 200 mL/hr over 30 Minutes Intravenous Every 8 hours 08/30/22 0826 09/06/22 0920   08/23/22 2100  ceFEPIme (MAXIPIME) 2 g in sodium chloride 0.9 % 100 mL IVPB  Status:  Discontinued        2 g 200 mL/hr over 30 Minutes Intravenous Every 12 hours 08/23/22 1357 08/30/22 0826   08/23/22 0400  micafungin (MYCAMINE) 100 mg in sodium chloride 0.9 % 100 mL IVPB  Status:  Discontinued        100 mg 105 mL/hr over 1 Hours Intravenous Daily 08/23/22 0242 09/08/22 1029   08/22/22 1800  metroNIDAZOLE (FLAGYL) IVPB 500 mg  Status:  Discontinued        500 mg 100 mL/hr over 60 Minutes Intravenous Every 12 hours 08/22/22 1711 09/04/22 0741   08/22/22 1800  ceFEPIme (MAXIPIME) 2 g in sodium chloride 0.9 % 100 mL IVPB  Status:  Discontinued        2 g 200 mL/hr over 30 Minutes Intravenous Every  8 hours 08/22/22 1716 08/23/22 1357   08/22/22 1724  metroNIDAZOLE (FLAGYL) 500 MG/100ML IVPB       Note to Pharmacy: [name]: cabinet override      08/22/22 1724 08/23/22 0845   08/19/22 1030  cefTRIAXone (ROCEPHIN) 1 g in sodium chloride 0.9 % 100 mL IVPB  Status:  Discontinued        1 g 200 mL/hr over 30 Minutes Intravenous Every 24 hours 08/19/22 0937 08/22/22 1715       Assessment/Plan: POD 18 s/p exploratory laparotomy, right hemicolectomy, ileostomy, mucus fistula, biopsy of pelvic mass by Dr. 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 Margins negative. 0/43 lymph nodes.  - Discussed case with attending - given mass is cancerous, the concern is that this was not resectable during her  initial operation. If this progresses and causes another obstruction I think her options would be limited surgically. Dr. Mosetta Putt of Oncology has seen and felt her cancer is likely incurable and her overall prognosis is poor. They will plan to see her back in 4-6 weeks to discuss options for chemo. Palliative is following - patient wishes to proceed with full code/full scope - Ileostomy was high output. Output is much thicker today and downtrending to 100cc/24 hours. Will decrease imodium today. Cont BID fiber, BID iron and BID Lomotil otherwise. May need to decrease these more tomorrow. Monitor output, Cr, electrolytes. - WOC for stoma teaching and assistance with pouching. - BID wet to dry to midline - CT 7/14 obtained for drain appearing feculent - this showed intra-abdominal fluid collections. IR consulted for drainage but felt the intra-abdominal fluid collections are small and look like post-op blood. Repeat CT 7/19 similar. S/p IR drain 7/20 with tubry cloudy fluid.  - Cx from IR drain with pseudomonas and strep anginosis. Abx per ID.  - Cont JP drain - no longer feculent  - IR drain and flushes per IR.  - Mobilize, PT - rec for SNF    FEN: Soft diet. Shakes. Undergoing cal count VTE: SCDs, Eliquis  ID: Cefepime/flagyl per ID Foley: Removed, Monitor. Urology following. S/p R PCN Dispo - As above   Per [facility] New stroke - Seen on MRI 7/10, suspected thromboembolic, defer to primary team/stroke team for further w/u and management.  PE HTN HLD GERD COPD R hydro - s/p R PCN CT 7/14 also suggestion of splenic infarct and bilateral pleural effusions    Elmer Sow [facility] 09/09/2022

## 2022-09-09 NOTE — Progress Notes (Signed)
PROGRESS NOTE    Debra Sharp  YQM:[id number] DOB: [birth year] DOA: 08/18/2022 PCP: Clayborn Heron, MD   Brief Narrative:  This 72 year old female with past medical history of hypertension, hyperlipidemia, systolic CHF, thrombocytopenia, COPD, asthma who initially presented with weakness, abdominal pain. Workup showed anemia concerning for upper GI bleed. CT abdomen/pelvis showed colonic distention, possible obstruction. GI consulted, plan was for EGD but she could not tolerate prep. Also found to have right-sided hydronephrosis, incidental acute stroke on CT/MRI. Repeat CT abdomen/pelvis on 7/6 showed new intraperitoneal free air with presumed perforation of the colon. Surgery consulted and she underwent emergent exploratory laparotomy, right hemicolectomy, ileostomy with fistula, biopsy of pelvic mass. Patient was left intubated and was transferred to ICU for pain management. 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 Palliative care consulted. General surgery following. Currently on tube feeding. Hospital course also remarkable for finding of acute PE now on heparin drip. Now extubated and transferred to [facility] service on 7/12. Plan was for TEE which has been canceled because patient declined. Palliative care, urology ,surgery ,Neurology were following.     Currently titrating tube feeds.  Surgery following. Patient underwent new percutaneous drain in the right lower quadrant.  Assessment & Plan:   Principal Problem:   GI bleed Active Problems:   Symptomatic anemia   Thrombocytosis   Iron deficiency anemia   Abnormal CT of the abdomen   Colon distention   History of cardioembolic cerebrovascular accident (CVA)   Silent cerebral infarction (HCC)   Bowel perforation (HCC)   Ventilator dependent (HCC)   Pelvic mass   Change in bowel habits   Abnormal CT scan, gastrointestinal tract   Arterial hypotension   Septic shock (HCC)   Fecal occult blood test positive   Uterine  leiomyoma   Hydroureteronephrosis   Sepsis with acute hypoxic respiratory failure and septic shock (HCC)   Encephalopathy acute   Perforation bowel (HCC)   Malnutrition of moderate degree   Cerebrovascular accident (CVA) due to embolism of precerebral artery (HCC)   Acute metabolic encephalopathy   Delirium   Adenocarcinoma of small bowel (HCC)  Septic shock secondary to acute peritonitis: Colonic perforation due to obstructing pelvic mass:  Status post exploratory laparotomy with right hemicolectomy, ileostomy creation, mucus fistual formation, biopsy of pelvic mass, and intraoperative of vasculature using indocyanine green.   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   Currently on broad spectrum anti-infectives with micafungin, Flagyl, cefepime.  culture NGTD.   On tube feeds, will continue for now until she's able to maintain adequate PO intake by mouth. RD c/s for tube feeds.  General surgery following, appreciate recs.   IR had been consulted for drainage of intraabdominal fluid collections, but they thought intraabdominal fluid collections are small and appeared c/w post op blood Repeat CT scan being considered by surgery if worsening fever, leukocytosis, etc Leukocytosis persist.  CT 7/13 with small volume fluid within abdomen and pelvis with several collections demonstrating rim enhancement suggestive of abscesses, new, near complete hypoattenuation of splenic parenchyma with subcapsular low density fluid along peripheral margin of spleen, multiple mildly dilated smal bowel loops with numerous air fluid levels (post op ileus), severe R hydroureteronephrosis, new mild left hydronephrosis.  Small bilateral effusions.  Large complex mass occupying majority of low right pelvis.  Patient underwent new percutaneous drain in the RLQ, ID consulted to decide about antibiotics regimen at discharge.   Newly Diagnosed Terminal Ileum Adenocarcinoma with Peritoneal  Metastasis:  Culprit for colonic  perforation.  Initially concern for gynecological malignancy with elevated CA125, CA 19-19 but pathology showed small bowel primary.  Appreciate Dr. Latanya Maudlin recommendations.  Possible chemo after she recovers in 1-2 months.  ? Gyn follow up for debulking surgery of pelvic mass.  Overall prognosis is poor.  Recommending indefinite anticoagulation. Heparin is being switched to DOAC.   Chronic systolic CHF:  Echo on 08/18/2020 showed EF of 40 to 45%, global hypokinesis.  Previous echo on March 2024 showed EF of 30 to 35%.  We recommend follow-up with cardiology as an outpatient. She has bilateral LE edema - follow BNP Trial low dose lasix and follow.   Acute metabolic encephalopathy:  > Resolved. Likely from sepsis, delirium, CVAs.   -repeat CT brain neg for acute findings. She is much alert, following commands.    Acute CVA: MRI done on 7/10 with acute and late acute infarcts in multiple vascular territories consistent with central embolic source.   Appreciate neurology recs -> bilateral anterior/posterior infarcts with pattern more concerning for hypotension in setting of severe anemia and septic shock with hypotension (ddx includes hypercoagulable state with suspected malignancy).  Recommending transition to PO anticoagulation per primary team.  Atorvastatin 20 mg.  EEG done on 7/9 did not show any seizure.  There was plan for TEE but patient has declined. .  Recommended SNF on discharge. She has left-sided weakness. Hardly able to get up from the bed.  Currently on heparin drip, will change to Eliquis when she does not need any more surgery and is able to tolerate po intake.   Acute PE: CT angiogram showed acute PE.  Currently on heparin drip.  No evidence of right heart strain.  Switched to Eliquis    AKI/hyperkalemia/hypophosphatemia:   Currently kidney function are stable. Phosphorus being supplemented.   Hydronephrosis secondary to abdominal mass: Urology  following.  Follow-up CT showed persistent severe right hydroureteronephrosis.  IR did  PCNL placement on 7/15 (pulled overnight 7/15) -urology saw pt 7/16--and still flushes easily. -mild left hydro will need to be followed outpatient.   Acute on chronic anemia of renal disease /acute blood loss anemia secondary to surgery/chronic GI bleed due to small bowel adenocarcinoma:  Monitor hemoglobin.  Transfuse if less than 7.  Currently on PPI. Relatively stable.    Hypertension: Off hypertensives. BP stable.  Monitor blood pressure   Hyperlipidemia: Currently on Lipitor   Hyperglycemia: A1c 5.4, d/c SSI Currently on sliding scale insulin.     Left upper extremity edema:  Negative for DVT.  Thrombocytosis: Likely secondary to above. Patient was already on IV heparin, now transitioned to Eliquis.   Goals of care: Multiple comorbidities.  Palliative care were following.  She's currently full code/full scope of care.   DVT prophylaxis: Heparin gtt Code Status: Full code Family Communication: No family at bed side. Disposition Plan:   Status is: Inpatient Remains inpatient appropriate because:  Need for continued inpatient care.    Consultants:  Surgery Urology palliative  Procedures:  Echo EEG LUE duplex  Antimicrobials:  Anti-infectives (From admission, onward)    Start     Dose/Rate Route Frequency Ordered Stop   09/06/22 1200  ceFEPIme (MAXIPIME) 2 g in sodium chloride 0.9 % 100 mL IVPB        2 g 200 mL/hr over 30 Minutes Intravenous Every 12 hours 09/06/22 0920     09/04/22 2200  metroNIDAZOLE (FLAGYL) tablet 500 mg        500 mg Oral Every 12 hours  09/04/22 0741     08/30/22 0830  ceFEPIme (MAXIPIME) 2 g in sodium chloride 0.9 % 100 mL IVPB  Status:  Discontinued        2 g 200 mL/hr over 30 Minutes Intravenous Every 8 hours 08/30/22 0826 09/06/22 0920   08/23/22 2100  ceFEPIme (MAXIPIME) 2 g in sodium chloride 0.9 % 100 mL IVPB  Status:  Discontinued        2  g 200 mL/hr over 30 Minutes Intravenous Every 12 hours 08/23/22 1357 08/30/22 0826   08/23/22 0400  micafungin (MYCAMINE) 100 mg in sodium chloride 0.9 % 100 mL IVPB  Status:  Discontinued        100 mg 105 mL/hr over 1 Hours Intravenous Daily 08/23/22 0242 09/08/22 1029   08/22/22 1800  metroNIDAZOLE (FLAGYL) IVPB 500 mg  Status:  Discontinued        500 mg 100 mL/hr over 60 Minutes Intravenous Every 12 hours 08/22/22 1711 09/04/22 0741   08/22/22 1800  ceFEPIme (MAXIPIME) 2 g in sodium chloride 0.9 % 100 mL IVPB  Status:  Discontinued        2 g 200 mL/hr over 30 Minutes Intravenous Every 8 hours 08/22/22 1716 08/23/22 1357   08/22/22 1724  metroNIDAZOLE (FLAGYL) 500 MG/100ML IVPB       Note to Pharmacy: [name]: cabinet override      08/22/22 1724 08/23/22 0845   08/19/22 1030  cefTRIAXone (ROCEPHIN) 1 g in sodium chloride 0.9 % 100 mL IVPB  Status:  Discontinued        1 g 200 mL/hr over 30 Minutes Intravenous Every 24 hours 08/19/22 0937 08/22/22 1715       Subjective: Patient was seen and examined at bed side, overnight events noted,  Patient reports doing much better.  She reports pain is reasonably controlled. She has colostomy with brownish stool noted.  Patient reports pain is reasonably controlled.  Objective: Vitals:   09/09/22 0325 09/09/22 0817 09/09/22 0832 09/09/22 1200  BP: (!) 155/61 (!) 135/58  (!) 129/45  Pulse: 98 90 88 84  Resp: 16 15 14 20   Temp: 97.7 F (36.5 C) 98.1 F (36.7 C)  97.6 F (36.4 C)  TempSrc: Oral Oral  Oral  SpO2: 98% 100%  98%  Weight:      Height:        Intake/Output Summary (Last 24 hours) at 09/09/2022 1435 Last data filed at 09/09/2022 0400 Gross per 24 hour  Intake 100 ml  Output 1435 ml  Net -1335 ml   Filed Weights   09/07/22 0301  Weight: 78 kg    Examination:  General exam: Appears calm and comfortable, very deconditioned, not in any acute distress. Respiratory system: CTA bilaterally. Respiratory effort  normal. RR 15 Cardiovascular system: S1 & S2 heard, regular rate and rhythm. No murmer. No pedal edema. Gastrointestinal system: Abdomen is soft, non distended, mildly tender. Colostomy noted with brown colored stool in bag. Normal bowel sounds heard. Central nervous system: Alert and oriented x 3. No focal neurological deficits. Extremities: Edema+, No cyanosis, No clubbing. Skin: No rashes, lesions or ulcers Psychiatry: Judgement and insight appear normal. Mood & affect appropriate.     Data Reviewed: I have personally reviewed following labs and imaging studies  CBC: Recent Labs  Lab 09/05/22 0037 09/06/22 0103 09/07/22 0234 09/08/22 0656 09/09/22 0340  WBC 20.3* 16.3* 15.4* 15.8* 15.7*  HGB 8.2* 7.6* 8.0* 7.4* 7.5*  HCT 27.4* 25.9* 28.8* 25.0* 25.7*  MCV 81.5 82.7 87.0 84.2 86.5  PLT 1,279* 1,315* 1,239* 1,173* 1,172*   Basic Metabolic Panel: Recent Labs  Lab 09/03/22 0729 09/04/22 0405 09/06/22 0103 09/07/22 0234 09/08/22 0656 09/09/22 0340  NA 133* 133* 134* 134* 136 135  K 3.6 3.6 3.6 4.5 4.1 4.2  CL 110 111 113* 112* 115* 113*  CO2 15* 17* 15* 14* 16* 14*  GLUCOSE 110* 100* 126* 134* 107* 91  BUN 21 23 39* 43* 47* 49*  CREATININE 0.78 0.93 1.18* 1.42* 1.37* 1.49*  CALCIUM 7.7* 8.0* 8.5* 8.9 9.2 10.0  MG 1.8 1.7 1.7  --  1.7 1.7  PHOS 4.1 4.2 3.5  --  3.1 4.2   GFR: Estimated Creatinine Clearance: 37 mL/min (A) (by C-G formula based on SCr of 1.49 mg/dL (H)). Liver Function Tests: Recent Labs  Lab 09/04/22 0405  AST 18  ALT 11  ALKPHOS 71  BILITOT 0.1*  PROT 4.6*  ALBUMIN 1.5*   No results for input(s): "LIPASE", "AMYLASE" in the last 168 hours. No results for input(s): "AMMONIA" in the last 168 hours. Coagulation Profile: Recent Labs  Lab 09/05/22 0037  INR 1.2   Cardiac Enzymes: No results for input(s): "CKTOTAL", "CKMB", "CKMBINDEX", "TROPONINI" in the last 168 hours. BNP (last 3 results) No results for input(s): "PROBNP" in the last  8760 hours. HbA1C: No results for input(s): "HGBA1C" in the last 72 hours. CBG: Recent Labs  Lab 09/06/22 1636 09/06/22 2014 09/06/22 2356 09/07/22 0425 09/07/22 0741  GLUCAP 94 106* 108* 122* 102*   Lipid Profile: No results for input(s): "CHOL", "HDL", "LDLCALC", "TRIG", "CHOLHDL", "LDLDIRECT" in the last 72 hours. Thyroid Function Tests: No results for input(s): "TSH", "T4TOTAL", "FREET4", "T3FREE", "THYROIDAB" in the last 72 hours. Anemia Panel: No results for input(s): "VITAMINB12", "FOLATE", "FERRITIN", "TIBC", "IRON", "RETICCTPCT" in the last 72 hours.  Sepsis Labs: No results for input(s): "PROCALCITON", "LATICACIDVEN" in the last 168 hours.  Recent Results (from the past 240 hour(s))  Body fluid culture w Gram Stain     Status: None   Collection Time: 08/31/22  5:03 PM   Specimen: Urine, Random; Body Fluid  Result Value Ref Range Status   Specimen Description URINE, RANDOM  Final   Special Requests Normal  Final   Gram Stain   Final    WBC PRESENT, PREDOMINANTLY MONONUCLEAR NO ORGANISMS SEEN CYTOSPIN SMEAR    Culture   Final    NO GROWTH 3 DAYS Performed at [facility], [address] [place], [place] [address]    Report Status 09/03/2022 FINAL  Final  Aerobic/Anaerobic Culture w Gram Stain (surgical/deep wound)     Status: None   Collection Time: 09/05/22 12:14 PM   Specimen: Abscess  Result Value Ref Range Status   Specimen Description ABSCESS  Final   Special Requests NONE  Final   Gram Stain   Final    ABUNDANT WBC PRESENT, PREDOMINANTLY PMN ABUNDANT GRAM POSITIVE RODS FEW GRAM NEGATIVE RODS RARE GRAM POSITIVE COCCI    Culture   Final    FEW PSEUDOMONAS AERUGINOSA MODERATE STREPTOCOCCUS ANGINOSIS ABUNDANT BACTEROIDES OVATUS ABUNDANT BACTEROIDES VULGATUS BETA LACTAMASE POSITIVE Performed at [facility], [address], [place], [place] [address]    Report Status 09/08/2022 FINAL  Final   Organism ID, Bacteria PSEUDOMONAS  AERUGINOSA  Final   Organism ID, Bacteria STREPTOCOCCUS ANGINOSIS  Final      Susceptibility   Pseudomonas aeruginosa - MIC*    CEFTAZIDIME 2 SENSITIVE Sensitive  CIPROFLOXACIN <=0.25 SENSITIVE Sensitive     GENTAMICIN <=1 SENSITIVE Sensitive     IMIPENEM 1 SENSITIVE Sensitive     PIP/TAZO <=4 SENSITIVE Sensitive     CEFEPIME 2 SENSITIVE Sensitive     * FEW PSEUDOMONAS AERUGINOSA   Streptococcus anginosis - MIC*    PENICILLIN <=0.06 SENSITIVE Sensitive     CEFTRIAXONE <=0.12 SENSITIVE Sensitive     ERYTHROMYCIN 4 RESISTANT Resistant     LEVOFLOXACIN 0.5 SENSITIVE Sensitive     VANCOMYCIN 0.5 SENSITIVE Sensitive     * MODERATE STREPTOCOCCUS ANGINOSIS   Radiology Studies: No results found.  Scheduled Meds:  acetaminophen  650 mg Oral Q6H   apixaban  5 mg Oral BID   arformoterol  15 mcg Nebulization BID   ascorbic acid  500 mg Oral Daily   atorvastatin  20 mg Oral Daily   budesonide (PULMICORT) nebulizer solution  0.5 mg Nebulization BID   Chlorhexidine Gluconate Cloth  6 each Topical Daily   diphenoxylate-atropine  1 tablet Oral BID   feeding supplement  237 mL Oral TID BM   ferrous sulfate  325 mg Oral BID WC   loperamide  2 mg Oral BID   melatonin  3 mg Oral QHS   metroNIDAZOLE  500 mg Oral Q12H   multivitamin with minerals  1 tablet Oral Daily   pantoprazole (PROTONIX) IV  40 mg Intravenous Q12H   polycarbophil  625 mg Oral BID   revefenacin  175 mcg Nebulization Daily   sertraline  150 mg Oral Daily   sodium chloride flush  10-40 mL Intracatheter Q12H   sodium chloride flush  5 mL Intracatheter Q8H   tamsulosin  0.4 mg Oral Daily   zinc sulfate  220 mg Oral Daily   Continuous Infusions:  sodium chloride 250 mL (09/04/22 2000)   ceFEPime (MAXIPIME) IV 2 g (09/09/22 1215)   promethazine (PHENERGAN) injection (IM or IVPB)       LOS: 22 days    Time spent: 35 mins    Willeen Niece, MD [facility]   If 7PM-7AM, please contact night-coverage

## 2022-09-09 NOTE — Progress Notes (Signed)
Calorie Count Note Day 2 Results (final day)    48 hour calorie count ordered.  Visited patient at bedside who was soundly sleeping. No family at bedside. RD observed breakfast and [employer] bread in room. Breakfast had barely been eaten. RD was unable to determine if patient had eaten any [employer].  Diet: SOFT  Supplements: Ensure Plus High Protein po TID, each supplement provides 350 kcal and 20 grams of protein.   Breakfast: N/A Lunch: N/A Dinner: N/A Supplements: 2 Ensure   Total intake: 700 kcal (38% of minimum estimated needs)  40 g protein (44% of minimum estimated needs)  Nutrition Dx: Moderate Malnutrition related to chronic illness as evidenced by mild fat depletion, moderate muscle depletion.   Goal: Patient will meet greater than or equal to 90% of their needs   Intervention:  D/c Calorie Count   recommend nutrition support (TPN vs EN). Discussed with Dr. Dwain Sarna   Ensure Shiela Mayer po TID, each supplement provides 350 kcal and 20 grams of protein.   Feeding Assistance at meal times   Continue MVI, Vitamin C, Zinc  Leodis Rains, RDN, LDN  Clinical Nutrition

## 2022-09-10 DIAGNOSIS — K922 Gastrointestinal hemorrhage, unspecified: Secondary | ICD-10-CM | POA: Diagnosis not present

## 2022-09-10 LAB — TYPE AND SCREEN
ABO/RH(D): A POS
Antibody Screen: NEGATIVE
Unit division: 0

## 2022-09-10 LAB — BASIC METABOLIC PANEL
Anion gap: 7 (ref 5–15)
BUN: 51 mg/dL — ABNORMAL HIGH (ref 8–23)
CO2: 14 mmol/L — ABNORMAL LOW (ref 22–32)
Calcium: 9.6 mg/dL (ref 8.9–10.3)
Chloride: 113 mmol/L — ABNORMAL HIGH (ref 98–111)
Creatinine, Ser: 1.62 mg/dL — ABNORMAL HIGH (ref 0.44–1.00)
Glucose, Bld: 99 mg/dL (ref 70–99)
Potassium: 4.1 mmol/L (ref 3.5–5.1)
Sodium: 134 mmol/L — ABNORMAL LOW (ref 135–145)

## 2022-09-10 LAB — BPAM RBC
Blood Product Expiration Date: 202408202359
ISSUE DATE / TIME: 202407251035
Unit Type and Rh: 6200

## 2022-09-10 LAB — CBC
HCT: 22.9 % — ABNORMAL LOW (ref 36.0–46.0)
Hemoglobin: 6.7 g/dL — CL (ref 12.0–15.0)
MCH: 24.8 pg — ABNORMAL LOW (ref 26.0–34.0)
MCHC: 29.3 g/dL — ABNORMAL LOW (ref 30.0–36.0)
MCV: 84.8 fL (ref 80.0–100.0)
RBC: 2.7 MIL/uL — ABNORMAL LOW (ref 3.87–5.11)
WBC: 13.1 10*3/uL — ABNORMAL HIGH (ref 4.0–10.5)
nRBC: 0 % (ref 0.0–0.2)

## 2022-09-10 LAB — PREPARE RBC (CROSSMATCH)

## 2022-09-10 LAB — HEMOGLOBIN AND HEMATOCRIT, BLOOD
HCT: 28.6 % — ABNORMAL LOW (ref 36.0–46.0)
Hemoglobin: 8.5 g/dL — ABNORMAL LOW (ref 12.0–15.0)

## 2022-09-10 MED ORDER — DIPHENHYDRAMINE HCL 50 MG/ML IJ SOLN: 50.0000 mg | Freq: Once | INTRAMUSCULAR | Status: DC

## 2022-09-10 MED ORDER — DIPHENHYDRAMINE HCL 25 MG PO CAPS: 50.0000 mg | ORAL_CAPSULE | Freq: Once | ORAL | Status: DC

## 2022-09-10 MED ORDER — PREDNISONE 20 MG PO TABS
50.0000 mg | ORAL_TABLET | Freq: Four times a day (QID) | ORAL | Status: DC
  Filled 2022-09-10: qty 1

## 2022-09-10 MED ORDER — SODIUM CHLORIDE 0.9% IV SOLUTION: Freq: Once | INTRAVENOUS | Status: AC

## 2022-09-10 NOTE — TOC Progression Note (Signed)
Transition of Care [facility]) - Progression Note    Patient Details  Name: Debra Sharp MRN: [id number] Date of Birth: October 27, 1950  Transition of Care [facility] CM/SW Contact  Lockie Pares, RN Phone Number: 09/10/2022, 9:32 AM  Clinical Narrative:    Day 22, new PE on heparin, converted to eliquis, post op CVA hemiparesis. Still has drains, low H&H. LTAC following when stable. Refusing TEE at this time. Prior to hospital lived alone, has son. Palliative consulted as perforated colon found Ca. Full scope at this time.  Plan:   Continue to monitor for progress, LTAC eligiblity vs SNF.    Expected Discharge Plan: Home/Self Care Barriers to Discharge: Continued Medical Work up  Expected Discharge Plan and Services       Living arrangements for the past 2 months: Single Family Home                                       Social Determinants of Health (SDOH) Interventions SDOH Screenings   Food Insecurity: No Food Insecurity (08/19/2022)  Housing: Low Risk  (08/19/2022)  Transportation Needs: No Transportation Needs (08/19/2022)  Utilities: Not At Risk (08/19/2022)  Tobacco Use: Medium Risk (09/05/2022)    Readmission Risk Interventions     No data to display

## 2022-09-10 NOTE — Progress Notes (Signed)
19 Days Post-Op   Subjective/Chief Complaint: Seen with RN. No abdominal pain, n/v. Tolerating soft diet. Undergoing cal count. Having ostomy output - 575cc/24 hours.   Afebrile. No tachycardia or systolic hypotension. WBC 13.1 from 15.7. Hgb 6.7 this am - PRBC ordered by primary .   Objective: Vital signs in last 24 hours: Temp:  [97.4 F (36.3 C)-98 F (36.7 C)] 97.9 F (36.6 C) (07/25 0822) Pulse Rate:  [76-95] 79 (07/25 0822) Resp:  [14-20] 18 (07/25 0822) BP: (115-135)/(42-49) 115/49 (07/25 0822) SpO2:  [98 %-99 %] 99 % (07/25 0822) FiO2 (%):  [21 %] 21 % (07/24 2057) Last BM Date : 09/09/22  Intake/Output from previous day: 07/24 0701 - 07/25 0700 In: 20  Out: 1980 [Urine:1400; Drains:5; Stool:575] Intake/Output this shift: No intake/output data recorded.  Gen: Alert, NAD, pleasant Abd: Soft, mild distension, NT. Stoma and mucus fistula viable - ostomy appliance with thin brown stool in eakins bag.  Midline wound with some fibrinous tissue at the base but otherwise clean. L surgical JP drain with more purulent/milky output today. IR drain also with thin more purulent/milky output today.   Lab Results:  Recent Labs    09/09/22 0340 09/10/22 0340  WBC 15.7* 13.1*  HGB 7.5* 6.7*  HCT 25.7* 22.9*  PLT 1,172* 1,066*   BMET Recent Labs    09/09/22 0340 09/10/22 0340  NA 135 134*  K 4.2 4.1  CL 113* 113*  CO2 14* 14*  GLUCOSE 91 99  BUN 49* 51*  CREATININE 1.49* 1.62*  CALCIUM 10.0 9.6   PT/INR No results for input(s): "LABPROT", "INR" in the last 72 hours. ABG No results for input(s): "PHART", "HCO3" in the last 72 hours.  Invalid input(s): "PCO2", "PO2"  Studies/Results: No results found.  Anti-infectives: Anti-infectives (From admission, onward)    Start     Dose/Rate Route Frequency Ordered Stop   09/06/22 1200  ceFEPIme (MAXIPIME) 2 g in sodium chloride 0.9 % 100 mL IVPB        2 g 200 mL/hr over 30 Minutes Intravenous Every 12 hours 09/06/22  0920     09/04/22 2200  metroNIDAZOLE (FLAGYL) tablet 500 mg        500 mg Oral Every 12 hours 09/04/22 0741     08/30/22 0830  ceFEPIme (MAXIPIME) 2 g in sodium chloride 0.9 % 100 mL IVPB  Status:  Discontinued        2 g 200 mL/hr over 30 Minutes Intravenous Every 8 hours 08/30/22 0826 09/06/22 0920   08/23/22 2100  ceFEPIme (MAXIPIME) 2 g in sodium chloride 0.9 % 100 mL IVPB  Status:  Discontinued        2 g 200 mL/hr over 30 Minutes Intravenous Every 12 hours 08/23/22 1357 08/30/22 0826   08/23/22 0400  micafungin (MYCAMINE) 100 mg in sodium chloride 0.9 % 100 mL IVPB  Status:  Discontinued        100 mg 105 mL/hr over 1 Hours Intravenous Daily 08/23/22 0242 09/08/22 1029   08/22/22 1800  metroNIDAZOLE (FLAGYL) IVPB 500 mg  Status:  Discontinued        500 mg 100 mL/hr over 60 Minutes Intravenous Every 12 hours 08/22/22 1711 09/04/22 0741   08/22/22 1800  ceFEPIme (MAXIPIME) 2 g in sodium chloride 0.9 % 100 mL IVPB  Status:  Discontinued        2 g 200 mL/hr over 30 Minutes Intravenous Every 8 hours 08/22/22 1716 08/23/22 1357   08/22/22 1724  metroNIDAZOLE (FLAGYL) 500 MG/100ML IVPB       Note to Pharmacy: [name]: cabinet override      08/22/22 1724 08/23/22 0845   08/19/22 1030  cefTRIAXone (ROCEPHIN) 1 g in sodium chloride 0.9 % 100 mL IVPB  Status:  Discontinued        1 g 200 mL/hr over 30 Minutes Intravenous Every 24 hours 08/19/22 0937 08/22/22 1715       Assessment/Plan: POD 19 s/p exploratory laparotomy, right hemicolectomy, ileostomy, mucus fistula, biopsy of pelvic mass by Dr. 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 Margins negative. 0/43 lymph nodes.  - Discussed case with attending - given mass is cancerous, the concern is that this was not resectable during her initial operation. If this progresses and causes another  obstruction I think her options would be limited surgically. Dr. Mosetta Putt of Oncology has seen and felt her cancer is likely incurable and her overall prognosis is poor. They will plan to see her back in 4-6 weeks to discuss options for chemo. Palliative is following - patient wishes to proceed with full code/full scope - Ileostomy was high output. Improved Cont BID fiber, BID iron BID imodium and BID Lomotil otherwise. Monitor output, Cr, electrolytes. - WOC for stoma teaching and assistance with pouching. - BID wet to dry to midline - CT 7/14 obtained for drain appearing feculent - this showed intra-abdominal fluid collections. IR consulted for drainage but felt the intra-abdominal fluid collections are small and look like post-op blood. Repeat CT 7/19 similar. S/p IR drain 7/20 with tubry cloudy fluid.  - Cx from IR drain with pseudomonas and strep anginosis. Abx per ID. Repeat CT planned for 7/26 - Cont JP drain - no longer feculent  - IR drain and flushes per IR.  - Mobilize, PT - rec for SNF    FEN: Soft diet. Shakes. Undergoing cal count VTE: SCDs, Eliquis  ID: Cefepime/flagyl per ID Foley: Removed, Monitor. Urology following. S/p R PCN Dispo - As above   Per [facility] New stroke - Seen on MRI 7/10, suspected thromboembolic, defer to primary team/stroke team for further w/u and management.  Anemia - [facility] has ordered PRBC PE HTN HLD GERD COPD R hydro - s/p R PCN CT 7/14 also suggestion of splenic infarct and bilateral pleural effusions    Elmer Sow [facility] 09/10/2022

## 2022-09-10 NOTE — Progress Notes (Signed)
Nutrition Brief Note  RD attempted to visit with patient who was sleeping. RD did not awaken.   Calorie count d/c'd. Patient is not eating adequately to meet her nutrition needs. Palliative met with patient again today who wants to remain full code.  RD collected leftover meal tickets from yesterday-- out of 2 meals, she ate fruit cocktail and 1/3 of orange sherbet. Due to her poor nutrition, she should consider tube feeds and potential PEG placement, otherwise, poor nutrition could expedite patient's decline.  Day 1 Results  Breakfast: N/A Lunch: 2 bites of mac n cheese, 2 bites of meatloaf with gravy, 1 cup of collard greens, 100% unsweetened iced tea   Dinner: N/A Supplements: 2.5 Ensure    Total intake: 935 kcal (51% of minimum estimated needs)  54 g protein (60% of minimum estimated needs)  Day 2 Results Breakfast: N/A Lunch: N/A Dinner: N/A Supplements: 2 Ensure    Total intake: 700 kcal (38% of minimum estimated needs)  40 g protein (44% of minimum estimated needs)  Leodis Rains, RDN, LDN  Clinical Nutrition

## 2022-09-10 NOTE — Progress Notes (Signed)
PROGRESS NOTE    Debra Sharp  OZH:[id number] DOB: 1950-10-26 DOA: 08/18/2022 PCP: Clayborn Heron, MD   Brief Narrative:  This 72 year old female with past medical history of hypertension, hyperlipidemia, systolic CHF, thrombocytopenia, COPD, asthma who initially presented with weakness, abdominal pain. Workup showed anemia concerning for upper GI bleed. CT abdomen/pelvis showed colonic distention, possible obstruction. GI consulted, plan was for EGD but she could not tolerate prep. Also found to have right-sided hydronephrosis, incidental acute stroke on CT/MRI. Repeat CT abdomen/pelvis on 7/6 showed new intraperitoneal free air with presumed perforation of the colon. Surgery consulted and she underwent emergent exploratory laparotomy, right hemicolectomy, ileostomy with fistula, biopsy of pelvic mass. Patient was left intubated and was transferred to ICU for pain management. 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 Palliative care consulted. General surgery following. Currently on tube feeding. Hospital course also remarkable for finding of acute PE now on heparin drip. Now extubated and transferred to [facility] service on 7/12. Plan was for TEE which has been canceled because patient declined. Palliative care, urology ,surgery ,Neurology were following.  Currently titrating tube feeds.  Surgery following. Patient underwent new percutaneous drain in the right lower quadrant.  Assessment & Plan:   Principal Problem:   GI bleed Active Problems:   Symptomatic anemia   Thrombocytosis   Iron deficiency anemia   Abnormal CT of the abdomen   Colon distention   History of cardioembolic cerebrovascular accident (CVA)   Silent cerebral infarction (HCC)   Bowel perforation (HCC)   Ventilator dependent (HCC)   Pelvic mass   Change in bowel habits   Abnormal CT scan, gastrointestinal tract   Arterial hypotension   Septic shock (HCC)   Fecal occult blood test positive   Uterine  leiomyoma   Hydroureteronephrosis   Sepsis with acute hypoxic respiratory failure and septic shock (HCC)   Encephalopathy acute   Perforation bowel (HCC)   Malnutrition of moderate degree   Cerebrovascular accident (CVA) due to embolism of precerebral artery (HCC)   Acute metabolic encephalopathy   Delirium   Adenocarcinoma of small bowel (HCC)  Septic shock secondary to acute peritonitis: Colonic perforation due to obstructing pelvic mass:  Status post exploratory laparotomy with right hemicolectomy, ileostomy creation, mucus fistual formation, biopsy of pelvic mass, and intraoperative of vasculature using indocyanine green.   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   Currently on broad spectrum anti-infectives with micafungin, Flagyl, cefepime.  culture NGTD.   On tube feeds, will continue for now until she's able to maintain adequate PO intake by mouth. RD c/s for tube feeds.  General surgery following, appreciate recs.   IR had been consulted for drainage of intraabdominal fluid collections, but they thought intraabdominal fluid collections are small and appeared c/w post op blood Repeat CT scan being considered by surgery if worsening fever, leukocytosis, etc Leukocytosis persist.  CT 7/13 with small volume fluid within abdomen and pelvis with several collections demonstrating rim enhancement suggestive of abscesses, new, near complete hypoattenuation of splenic parenchyma with subcapsular low density fluid along peripheral margin of spleen, multiple mildly dilated smal bowel loops with numerous air fluid levels (post op ileus), severe R hydroureteronephrosis, new mild left hydronephrosis.  Small bilateral effusions.  Large complex mass occupying majority of low right pelvis.  Patient underwent new percutaneous drain in the RLQ, ID consulted to decide about antibiotics regimen at discharge. Patient is scheduled to have repeat CT/A/P tomorrow, needs prep for  allergic reaction.   Newly Diagnosed  Terminal Ileum Adenocarcinoma with Peritoneal Metastasis:  Culprit for colonic perforation.  Initially concern for gynecological malignancy with elevated CA125, CA 19-19 but pathology showed small bowel primary.  Appreciate Dr. Latanya Maudlin recommendations.  Possible chemo after she recovers in 1-2 months.  ? Gyn follow up for debulking surgery of pelvic mass.  Overall prognosis is poor.  Recommending indefinite anticoagulation. Heparin is being switched to DOAC. Palliative care consulted, patient wants to have full scope of care.  Chronic systolic CHF:  Echo on 08/18/2020 showed EF of 40 to 45%, global hypokinesis.  Previous echo on March 2024 showed EF of 30 to 35%.  We recommend follow-up with cardiology as an outpatient. She has bilateral LE edema - follow BNP 117 She appears euvolemic.    Acute metabolic encephalopathy:  > Resolved. Likely from sepsis, delirium, CVAs.   -repeat CT brain neg for acute findings. She is much alert, following commands.    Acute CVA: MRI done on 7/10 with acute and late acute infarcts in multiple vascular territories consistent with central embolic source.   Appreciate neurology recs -> bilateral anterior/posterior infarcts with pattern more concerning for hypotension in setting of severe anemia and septic shock with hypotension (ddx includes hypercoagulable state with suspected malignancy).  Recommending transition to PO anticoagulation per primary team.  Atorvastatin 20 mg.  EEG done on 7/9 did not show any seizure.  There was plan for TEE but patient has declined. .  Recommended SNF on discharge. She has left-sided weakness. Hardly able to get up from the bed. Switched to [employer].   Acute PE: CT angiogram showed acute PE.  No evidence of right heart strain.  Continue Eliquis.   AKI / Hyperkalemia/ Hypophosphatemia:   Currently kidney function are stable. Phosphorus being supplemented.   Hydronephrosis secondary to abdominal  mass:  Urology following.  Follow-up CT showed persistent severe right hydroureteronephrosis.  IR did  PCNL placement on 7/15 (pulled overnight 7/15) -urology saw pt 7/16--and still flushes easily. -mild left hydro will need to be followed outpatient.   Acute on chronic anemia of renal disease /Acute blood loss anemia secondary to surgery/chronic GI bleed due to small bowel adenocarcinoma:  Monitor hemoglobin.  Transfuse if less than 7.  Currently on PPI. Relatively stable. Hb. dropped  to 6/7, Transfuse 1 unit PRBC, follow up CBC.   Hypertension: Off hypertensives. BP stable.  Monitor blood pressure   Hyperlipidemia: Currently on Lipitor   Hyperglycemia: A1c 5.4, d/c SSI Currently on sliding scale insulin.     Left upper extremity edema:  Negative for DVT.  Thrombocytosis: Likely secondary to above. Patient was already on IV heparin, now transitioned to Eliquis.   Goals of care: Multiple comorbidities.  Palliative care were following.  She's currently full code/full scope of care.   DVT prophylaxis: Heparin gtt Code Status: Full code Family Communication: No family at bed side. Disposition Plan:   Status is: Inpatient Remains inpatient appropriate because:  Need for continued inpatient care.    Consultants:  Surgery Urology palliative  Procedures:  Echo EEG LUE duplex  Antimicrobials:  Anti-infectives (From admission, onward)    Start     Dose/Rate Route Frequency Ordered Stop   09/06/22 1200  ceFEPIme (MAXIPIME) 2 g in sodium chloride 0.9 % 100 mL IVPB        2 g 200 mL/hr over 30 Minutes Intravenous Every 12 hours 09/06/22 0920     09/04/22 2200  metroNIDAZOLE (FLAGYL) tablet 500 mg  500 mg Oral Every 12 hours 09/04/22 0741     08/30/22 0830  ceFEPIme (MAXIPIME) 2 g in sodium chloride 0.9 % 100 mL IVPB  Status:  Discontinued        2 g 200 mL/hr over 30 Minutes Intravenous Every 8 hours 08/30/22 0826 09/06/22 0920   08/23/22 2100  ceFEPIme (MAXIPIME) 2  g in sodium chloride 0.9 % 100 mL IVPB  Status:  Discontinued        2 g 200 mL/hr over 30 Minutes Intravenous Every 12 hours 08/23/22 1357 08/30/22 0826   08/23/22 0400  micafungin (MYCAMINE) 100 mg in sodium chloride 0.9 % 100 mL IVPB  Status:  Discontinued        100 mg 105 mL/hr over 1 Hours Intravenous Daily 08/23/22 0242 09/08/22 1029   08/22/22 1800  metroNIDAZOLE (FLAGYL) IVPB 500 mg  Status:  Discontinued        500 mg 100 mL/hr over 60 Minutes Intravenous Every 12 hours 08/22/22 1711 09/04/22 0741   08/22/22 1800  ceFEPIme (MAXIPIME) 2 g in sodium chloride 0.9 % 100 mL IVPB  Status:  Discontinued        2 g 200 mL/hr over 30 Minutes Intravenous Every 8 hours 08/22/22 1716 08/23/22 1357   08/22/22 1724  metroNIDAZOLE (FLAGYL) 500 MG/100ML IVPB       Note to Pharmacy: [name]: cabinet override      08/22/22 1724 08/23/22 0845   08/19/22 1030  cefTRIAXone (ROCEPHIN) 1 g in sodium chloride 0.9 % 100 mL IVPB  Status:  Discontinued        1 g 200 mL/hr over 30 Minutes Intravenous Every 24 hours 08/19/22 0937 08/22/22 1715       Subjective: Patient was seen and examined at bed side, overnight events noted,  Patient reports doing much better.  She reports pain is reasonably controlled. She has colostomy with brownish stool noted.  Patient reports pain is reasonably controlled. Her hemoglobin has dropped requiring 1 unit of PRBC today.  Objective: Vitals:   09/10/22 0444 09/10/22 0822 09/10/22 1025 09/10/22 1054  BP: (!) 115/46 (!) 115/49 (!) 107/45 (!) 101/44  Pulse: 76 79 80 84  Resp: 17 18 13 12   Temp: 97.6 F (36.4 C) 97.9 F (36.6 C) 97.9 F (36.6 C) 97.9 F (36.6 C)  TempSrc: Oral Oral Oral Oral  SpO2: 99% 99% 100% 100%  Weight:      Height:        Intake/Output Summary (Last 24 hours) at 09/10/2022 1132 Last data filed at 09/10/2022 9163 Gross per 24 hour  Intake 20 ml  Output 1980 ml  Net -1960 ml   Filed Weights   09/07/22 0301  Weight: 78 kg     Examination:  General exam: Appears comfortable, very deconditioned, not in any acute distress. Respiratory system: CTA bilaterally. Respiratory effort normal. RR 15 Cardiovascular system: S1 & S2 heard, regular rate and rhythm. No murmer. No pedal edema. Gastrointestinal system: Abdomen is soft, nondistended, mildly tender.Colostomy noted with brown colored stool in bag. Normal bowel sounds heard. Central nervous system: Alert and oriented x 3, no focal neurological deficits. Extremities: Edema+, No cyanosis, No clubbing. Skin: No rashes, lesions or ulcers Psychiatry: Judgement and insight appear normal. Mood & affect appropriate.     Data Reviewed: I have personally reviewed following labs and imaging studies  CBC: Recent Labs  Lab 09/06/22 0103 09/07/22 0234 09/08/22 0656 09/09/22 0340 09/10/22 0340  WBC 16.3* 15.4* 15.8*  15.7* 13.1*  HGB 7.6* 8.0* 7.4* 7.5* 6.7*  HCT 25.9* 28.8* 25.0* 25.7* 22.9*  MCV 82.7 87.0 84.2 86.5 84.8  PLT 1,315* 1,239* 1,173* 1,172* 1,066*   Basic Metabolic Panel: Recent Labs  Lab 09/04/22 0405 09/06/22 0103 09/07/22 0234 09/08/22 0656 09/09/22 0340 09/10/22 0340  NA 133* 134* 134* 136 135 134*  K 3.6 3.6 4.5 4.1 4.2 4.1  CL 111 113* 112* 115* 113* 113*  CO2 17* 15* 14* 16* 14* 14*  GLUCOSE 100* 126* 134* 107* 91 99  BUN 23 39* 43* 47* 49* 51*  CREATININE 0.93 1.18* 1.42* 1.37* 1.49* 1.62*  CALCIUM 8.0* 8.5* 8.9 9.2 10.0 9.6  MG 1.7 1.7  --  1.7 1.7  --   PHOS 4.2 3.5  --  3.1 4.2  --    GFR: Estimated Creatinine Clearance: 34.1 mL/min (A) (by C-G formula based on SCr of 1.62 mg/dL (H)). Liver Function Tests: Recent Labs  Lab 09/04/22 0405  AST 18  ALT 11  ALKPHOS 71  BILITOT 0.1*  PROT 4.6*  ALBUMIN 1.5*   No results for input(s): "LIPASE", "AMYLASE" in the last 168 hours. No results for input(s): "AMMONIA" in the last 168 hours. Coagulation Profile: Recent Labs  Lab 09/05/22 0037  INR 1.2   Cardiac  Enzymes: No results for input(s): "CKTOTAL", "CKMB", "CKMBINDEX", "TROPONINI" in the last 168 hours. BNP (last 3 results) No results for input(s): "PROBNP" in the last 8760 hours. HbA1C: No results for input(s): "HGBA1C" in the last 72 hours. CBG: Recent Labs  Lab 09/06/22 1636 09/06/22 2014 09/06/22 2356 09/07/22 0425 09/07/22 0741  GLUCAP 94 106* 108* 122* 102*   Lipid Profile: No results for input(s): "CHOL", "HDL", "LDLCALC", "TRIG", "CHOLHDL", "LDLDIRECT" in the last 72 hours. Thyroid Function Tests: No results for input(s): "TSH", "T4TOTAL", "FREET4", "T3FREE", "THYROIDAB" in the last 72 hours. Anemia Panel: No results for input(s): "VITAMINB12", "FOLATE", "FERRITIN", "TIBC", "IRON", "RETICCTPCT" in the last 72 hours.  Sepsis Labs: No results for input(s): "PROCALCITON", "LATICACIDVEN" in the last 168 hours.  Recent Results (from the past 240 hour(s))  Body fluid culture w Gram Stain     Status: None   Collection Time: 08/31/22  5:03 PM   Specimen: Urine, Random; Body Fluid  Result Value Ref Range Status   Specimen Description URINE, RANDOM  Final   Special Requests Normal  Final   Gram Stain   Final    WBC PRESENT, PREDOMINANTLY MONONUCLEAR NO ORGANISMS SEEN CYTOSPIN SMEAR    Culture   Final    NO GROWTH 3 DAYS Performed at [facility], [address] [place], [place] [address]    Report Status 09/03/2022 FINAL  Final  Aerobic/Anaerobic Culture w Gram Stain (surgical/deep wound)     Status: None   Collection Time: 09/05/22 12:14 PM   Specimen: Abscess  Result Value Ref Range Status   Specimen Description ABSCESS  Final   Special Requests NONE  Final   Gram Stain   Final    ABUNDANT WBC PRESENT, PREDOMINANTLY PMN ABUNDANT GRAM POSITIVE RODS FEW GRAM NEGATIVE RODS RARE GRAM POSITIVE COCCI    Culture   Final    FEW PSEUDOMONAS AERUGINOSA MODERATE STREPTOCOCCUS ANGINOSIS ABUNDANT BACTEROIDES OVATUS ABUNDANT BACTEROIDES VULGATUS BETA LACTAMASE  POSITIVE Performed at [facility], [address] [place], [place] [address]    Report Status 09/08/2022 FINAL  Final   Organism ID, Bacteria PSEUDOMONAS AERUGINOSA  Final   Organism ID, Bacteria STREPTOCOCCUS ANGINOSIS  Final      Susceptibility   Pseudomonas aeruginosa - MIC*    CEFTAZIDIME 2 SENSITIVE Sensitive     CIPROFLOXACIN <=0.25 SENSITIVE Sensitive     GENTAMICIN <=1 SENSITIVE Sensitive     IMIPENEM 1 SENSITIVE Sensitive     PIP/TAZO <=4 SENSITIVE Sensitive     CEFEPIME 2 SENSITIVE Sensitive     * FEW PSEUDOMONAS AERUGINOSA   Streptococcus anginosis - MIC*    PENICILLIN <=0.06 SENSITIVE Sensitive     CEFTRIAXONE <=0.12 SENSITIVE Sensitive     ERYTHROMYCIN 4 RESISTANT Resistant     LEVOFLOXACIN 0.5 SENSITIVE Sensitive     VANCOMYCIN 0.5 SENSITIVE Sensitive     * MODERATE STREPTOCOCCUS ANGINOSIS   Radiology Studies: No results found.  Scheduled Meds:  sodium chloride   Intravenous Once   acetaminophen  650 mg Oral Q6H   apixaban  5 mg Oral BID   arformoterol  15 mcg Nebulization BID   ascorbic acid  500 mg Oral Daily   atorvastatin  20 mg Oral Daily   budesonide (PULMICORT) nebulizer solution  0.5 mg Nebulization BID   Chlorhexidine Gluconate Cloth  6 each Topical Daily   [START ON 09/11/2022] diphenhydrAMINE  50 mg Oral Once   Or   [START ON 09/11/2022] diphenhydrAMINE  50 mg Intravenous Once   diphenoxylate-atropine  1 tablet Oral BID   feeding supplement  237 mL Oral TID BM   ferrous sulfate  325 mg Oral BID WC   loperamide  2 mg Oral BID   melatonin  3 mg Oral QHS   metroNIDAZOLE  500 mg Oral Q12H   multivitamin with minerals  1 tablet Oral Daily   pantoprazole (PROTONIX) IV  40 mg Intravenous Q12H   polycarbophil  625 mg Oral BID   predniSONE  50 mg Oral Q6H   revefenacin  175 mcg Nebulization Daily   sertraline  150 mg Oral Daily   sodium chloride flush  10-40 mL Intracatheter Q12H   sodium chloride flush  5 mL Intracatheter Q8H   tamsulosin   0.4 mg Oral Daily   zinc sulfate  220 mg Oral Daily   Continuous Infusions:  sodium chloride 250 mL (09/04/22 2000)   ceFEPime (MAXIPIME) IV 2 g (09/09/22 2320)   promethazine (PHENERGAN) injection (IM or IVPB)       LOS: 23 days    Time spent: 35 mins    Willeen Niece, MD [facility]   If 7PM-7AM, please contact night-coverage

## 2022-09-10 NOTE — Progress Notes (Signed)
    Medical records reviewed including progress notes, labs, and imaging. Patient is planned for pRBC today given Hb 6.7 and repeat CT scan tomorrow 7/26. Continues to tolerate soft diet.  Goals of care are clear at this time for full code/full scope treatment. Patient and family have been encouraged to reach out with additional palliative needs.  PMT will continue to follow peripherally. Thank you for your referral and allowing PMT to assist in [name]'s care.    Richardson Dopp, [facility] Palliative Medicine Team  Team Phone # [phone]   NO CHARGE

## 2022-09-10 NOTE — Progress Notes (Signed)
Referring Physician(s): DR Dwain Sarna  Supervising Physician: Roanna Banning  Patient Status:  [facility] - In-pt  Chief Complaint:  Exploratory laparotomy, right hemicolectomy, ileostomy, mucus fistula, biopsy of pelvic mass by Dr. Bedelia Person 7/6 for Colonic perforation secondary to obstructing pelvic mass   Subjective:  RLQ abscess drain placed in IR 7/20-- minimal OP R PCN placed in IR 7/15   Both intact Drain flush/asp easily-- OP purulent PCN OP bloody; asp/flushes easily--- Urology following  Allergies: Iodinated contrast media, Aspirin, Ibuprofen, Sulfonamide derivatives, and Penicillins  Medications: Prior to Admission medications   Medication Sig Start Date End Date Taking? Authorizing Provider  cetirizine (ZYRTEC) 10 MG tablet Take 1 tablet (10 mg total) by mouth daily. Patient taking differently: Take 10 mg by mouth daily as needed for allergies or rhinitis. 10/09/19  Yes Hawks, Christy A, FNP  ferrous sulfate 325 (65 FE) MG EC tablet Take 325 mg by mouth daily.   Yes [provider]  fluticasone (FLONASE) 50 MCG/ACT nasal spray Place 2 sprays into both nostrils daily. Patient taking differently: Place 2 sprays into both nostrils at bedtime as needed for allergies or rhinitis. 10/09/19  Yes Hawks, Christy A, FNP  lisinopril (ZESTRIL) 40 MG tablet Take 40 mg by mouth in the morning.   Yes [provider]  lovastatin (MEVACOR) 20 MG tablet Take 20 mg by mouth at bedtime.   Yes [provider]  PROAIR HFA 108 (90 Base) MCG/ACT inhaler Inhale 2 puffs into the lungs every 6 (six) hours as needed for wheezing or shortness of breath.   Yes [provider]  sertraline (ZOLOFT) 100 MG tablet Take 150 mg by mouth in the morning.   Yes [provider]  SYMBICORT 160-4.5 MCG/ACT inhaler Inhale 2 puffs into the lungs in the morning and at bedtime.   Yes [provider]     Vital Signs: BP (!) 115/46 (BP Location: Left Arm)   Pulse 76    Temp 97.6 F (36.4 C) (Oral)   Resp 17   Ht 5' 5.98" (1.676 m)   Wt 171 lb 15.3 oz (78 kg)   SpO2 99%   BMI 27.77 kg/m   Physical Exam Vitals reviewed.  Skin:    General: Skin is warm.     Comments: Sites of Rt PCN and Rt abd drain are c/d/I No bleeding No infection Both drains flush and asp easily  OP of abscess drain minimal; cloudy color 09/08/2022 FINAL  Organism ID, Bacteria PSEUDOMONAS AERUGINOSA Organism ID, Bacteria STREPTOCOCCUS ANGINOSIS  OP PCN bloody  Neurological:     Mental Status: She is alert.     Imaging: No results found.  Labs:  CBC: Recent Labs    09/07/22 0234 09/08/22 0656 09/09/22 0340 09/10/22 0340  WBC 15.4* 15.8* 15.7* 13.1*  HGB 8.0* 7.4* 7.5* 6.7*  HCT 28.8* 25.0* 25.7* 22.9*  PLT 1,239* 1,173* 1,172* 1,066*    COAGS: Recent Labs    08/21/22 0259 09/05/22 0037  INR 1.1 1.2    BMP: Recent Labs    09/07/22 0234 09/08/22 0656 09/09/22 0340 09/10/22 0340  NA 134* 136 135 134*  K 4.5 4.1 4.2 4.1  CL 112* 115* 113* 113*  CO2 14* 16* 14* 14*  GLUCOSE 134* 107* 91 99  BUN 43* 47* 49* 51*  CALCIUM 8.9 9.2 10.0 9.6  CREATININE 1.42* 1.37* 1.49* 1.62*  GFRNONAA 40* 42* 38* 34*    LIVER FUNCTION TESTS: Recent Labs    08/22/22 2052 08/23/22  1610 08/31/22 0150 09/04/22 0405  BILITOT 1.9* 1.4* 0.5 0.1*  AST 9* 14* 17 18  ALT 8 7 12 11   ALKPHOS 44 35* 91 71  PROT 4.6* 4.6* 4.7* 4.6*  ALBUMIN 2.7* 2.4* 1.5* 1.5*   Drain Location: RLQ/ R PCN Size: Fr size: 12 Fr Date of placement: abs drain 09/05/22 ; Rt PCN placed 7/15 Currently to: Drain collection device: suction bulb-drain; gravity PCN 24 hour output:  Output by Drain (mL) 09/08/22 0701 - 09/08/22 1900 09/08/22 1901 - 09/09/22 0700 09/09/22 0701 - 09/09/22 1900 09/09/22 1901 - 09/10/22 0700 09/10/22 0701 - 09/10/22 0733  Closed System Drain 1 Left LLQ 5 5     Closed System Drain Lateral RUQ 10 5  5      Interval imaging/drain manipulation:  none  Current  examination: Flushes/aspirates easily--Both abs drain and PCN Insertion site unremarkable. Suture and stat lock in place. Dressed appropriately.  OP drain is minimal and cloudy color OP PCN bloody  Plan: Continue TID flushes with 5 cc NS. Record output Q shift. Dressing changes QD or PRN if soiled.  Call IR APP or on call IR MD if difficulty flushing or sudden change in drain output.  Repeat imaging/possible drain injection once output < 10 mL/QD (excluding flush material). Consideration for drain removal if output is < 10 mL/QD (excluding flush material), pending discussion with the providing surgical service.  Discharge planning: Please contact IR APP or on call IR MD prior to patient d/c to ensure appropriate follow up plans are in place. Typically patient will follow up with IR clinic 10-14 days post d/c for repeat imaging/possible drain injection. IR scheduler will contact patient with date/time of appointment. Patient will need to flush drain QD with 5 cc NS, record output QD, dressing changes every 2-3 days or earlier if soiled.   R PCN followed by Urology--- plans per Uro team  IR will continue to follow - please call with questions or concerns.  Assessment and Plan:  Plan for CT abd/pelvis 7/26 Pt with new Reaction to Contrast--- documented SOB with 7/19 CT She will be pre medicated 13 hrs for 7/26 CT I have ordered pre meds and CT  Electronically Signed: Robet Leu, PA-C 09/10/2022, 7:33 AM   I spent a total of 15 Minutes at the the patient's bedside AND on the patient's hospital floor or unit, greater than 50% of which was counseling/coordinating care for Rt abd abscess drain and Rt PCN

## 2022-09-10 NOTE — Progress Notes (Signed)
[facility] Si Raider MD informed of critical HGB and platelets HGB 6.7,platelet 1.066 orders to transfuse 1 unit PRBC type and screen

## 2022-09-10 NOTE — Plan of Care (Signed)

## 2022-09-11 ENCOUNTER — Inpatient Hospital Stay (HOSPITAL_COMMUNITY): Payer: Medicare Other

## 2022-09-11 ENCOUNTER — Telehealth: Payer: Self-pay | Admitting: Hematology

## 2022-09-11 DIAGNOSIS — I6310 Cerebral infarction due to embolism of unspecified precerebral artery: Secondary | ICD-10-CM | POA: Diagnosis not present

## 2022-09-11 DIAGNOSIS — K922 Gastrointestinal hemorrhage, unspecified: Secondary | ICD-10-CM | POA: Diagnosis not present

## 2022-09-11 LAB — GLUCOSE, CAPILLARY: Glucose-Capillary: 74 mg/dL (ref 70–99)

## 2022-09-11 MED ORDER — DIPHENHYDRAMINE HCL 50 MG/ML IJ SOLN
50.0000 mg | Freq: Once | INTRAMUSCULAR | Status: AC
  Administered 2022-09-11: 50 mg via INTRAVENOUS

## 2022-09-11 MED ORDER — METHYLPREDNISOLONE SODIUM SUCC 40 MG IJ SOLR
40.0000 mg | Freq: Once | INTRAMUSCULAR | Status: AC
  Administered 2022-09-11: 40 mg via INTRAVENOUS
  Filled 2022-09-11: qty 1

## 2022-09-11 MED ORDER — IOHEXOL 350 MG/ML SOLN
60.0000 mL | Freq: Once | INTRAVENOUS | Status: AC | PRN
  Administered 2022-09-11: 60 mL via INTRAVENOUS

## 2022-09-11 MED ORDER — DIPHENHYDRAMINE HCL 25 MG PO CAPS: 50.0000 mg | ORAL_CAPSULE | Freq: Once | ORAL | Status: DC

## 2022-09-11 MED ORDER — PREDNISONE 20 MG PO TABS: 50.0000 mg | ORAL_TABLET | Freq: Four times a day (QID) | ORAL | Status: DC

## 2022-09-11 MED ORDER — KCL IN DEXTROSE-NACL 20-5-0.45 MEQ/L-%-% IV SOLN
INTRAVENOUS | Status: DC
  Filled 2022-09-11 (×2): qty 1000

## 2022-09-11 MED ORDER — DIPHENHYDRAMINE HCL 50 MG/ML IJ SOLN
50.0000 mg | Freq: Once | INTRAMUSCULAR | Status: DC
  Filled 2022-09-11: qty 1

## 2022-09-11 MED ORDER — LORAZEPAM 2 MG/ML IJ SOLN
1.0000 mg | Freq: Once | INTRAMUSCULAR | Status: AC | PRN
  Administered 2022-09-13: 1 mg via INTRAVENOUS
  Filled 2022-09-11: qty 1

## 2022-09-11 NOTE — Progress Notes (Signed)
Neurology Progress Note   S:// Code stroke activated on the floor for left-sided flaccid weakness, left-sided neglect. Reviewing the chart, patient had left upper extremity flaccid paralysis since the strokes were discovered-please see neurological consultation and progress notes from the prior weeks. Taken for stat CT head.  CT angio ordered in light of cortical symptoms.   O:// Current vital signs: BP (!) 157/56 (BP Location: Left Arm)   Pulse 88   Temp 97.9 F (36.6 C) (Axillary)   Resp 14   Ht 5' 5.98" (1.676 m)   Wt 78 kg   SpO2 100%   BMI 27.77 kg/m  Vital signs in last 24 hours: Temp:  [97.6 F (36.4 C)-98.7 F (37.1 C)] 97.9 F (36.6 C) (07/26 1113) Pulse Rate:  [80-97] 88 (07/26 1113) Resp:  [10-21] 14 (07/26 1113) BP: (108-157)/(51-62) 157/56 (07/26 1113) SpO2:  [100 %] 100 % (07/26 1113) FiO2 (%):  [21 %] 21 % (07/25 1339) General: Awake alert in no distress HEENT: Normocephalic atraumatic Lungs: Clear Cardiovascular: Regular rhythm Neurological exam She is awake alert oriented to self, fact that she in the hospital and month. Speech is mildly dysarthric No evidence of aphasia Slightly diminished attention concentration Cranial nerve examination: Pupils equal round react light, extraocular movement examination reveals intact movement to the right but incomplete movement to the left with inability to digging the eyes all the way looking to the left, visual fields appear intact, face appears symmetric. Motor examination with flaccid left upper extremity.  Only able to wiggle toes on the left and right lower extremity-similar to documented exam from Dr. Roda Shutters on 08/28/2022. Sensation diminished on the left.  Also extinguishes domicile/stimulation on the left. Coordination examination with no gross dysmetria. NIH stroke scale 1a Level of Conscious.: 0 1b LOC Questions: 0 1c LOC Commands: 0 2 Best Gaze: 1 3 Visual: 0 4 Facial Palsy:0  5a Motor Arm - left:4  5b  Motor Arm - Right:0  6a Motor Leg - Left: 3 6b Motor Leg - Right: 3 7 Limb Ataxia: 0 8 Sensory: 2 9 Best Language: 0 10 Dysarthria: 1 11 Extinct. and Inatten.: 1 TOTAL: 15   Medications  Current Facility-Administered Medications:    0.9 %  sodium chloride infusion, 250 mL, Intravenous, Continuous, Kamat, Sunil G, MD, Last Rate: 20 mL/hr at 09/04/22 2000, 250 mL at 09/04/22 2000   acetaminophen (TYLENOL) tablet 650 mg, 650 mg, Oral, Q6H, Leander Rams, RPH, 650 mg at 09/11/22 3664   albuterol (PROVENTIL) (2.5 MG/3ML) 0.083% nebulizer solution 2.5 mg, 2.5 mg, Inhalation, Q4H PRN, Coralyn Helling, MD   apixaban (ELIQUIS) tablet 5 mg, 5 mg, Oral, BID, Jenita Seashore, RPH, 5 mg at 09/10/22 0854   arformoterol (BROVANA) nebulizer solution 15 mcg, 15 mcg, Nebulization, BID, Craige Cotta, Vineet, MD, 15 mcg at 09/11/22 4034   ascorbic acid (VITAMIN C) tablet 500 mg, 500 mg, Oral, Daily, Khatri, Pardeep, MD, 500 mg at 09/10/22 0854   atorvastatin (LIPITOR) tablet 20 mg, 20 mg, Oral, Daily, Leander Rams, RPH, 20 mg at 09/07/22 1112   budesonide (PULMICORT) nebulizer solution 0.5 mg, 0.5 mg, Nebulization, BID, Sood, Vineet, MD, 0.5 mg at 09/11/22 0735   ceFEPIme (MAXIPIME) 2 g in sodium chloride 0.9 % 100 mL IVPB, 2 g, Intravenous, Q12H, Pham, Minh Q, RPH-CPP, Last Rate: 200 mL/hr at 09/11/22 0030, 2 g at 09/11/22 0030   Chlorhexidine Gluconate Cloth 2 % PADS 6 each, 6 each, Topical, Daily, Burnadette Pop, MD, 6 each at 09/11/22  3557   [START ON 09/12/2022] diphenhydrAMINE (BENADRYL) capsule 50 mg, 50 mg, Oral, Once, Han, Aimee H, PA-C   diphenhydrAMINE (BENADRYL) injection 50 mg, 50 mg, Intravenous, Once, Tyrone Nine, MD   diphenoxylate-atropine (LOMOTIL) 2.5-0.025 MG per tablet 1 tablet, 1 tablet, Oral, BID, Meuth, Brooke A, PA-C, 1 tablet at 09/10/22 0854   feeding supplement (ENSURE ENLIVE / ENSURE PLUS) liquid 237 mL, 237 mL, Oral, TID BM, Idelle Leech, Pardeep, MD, 237 mL at 09/09/22 0949   ferrous  sulfate tablet 325 mg, 325 mg, Oral, BID WC, Maczis, Elmer Sow, PA-C, 325 mg at 09/10/22 3220   hydrALAZINE (APRESOLINE) injection 10-40 mg, 10-40 mg, Intravenous, Q4H PRN, Karie Fetch P, DO   lip balm (CARMEX) ointment, , Topical, PRN, Steffanie Dunn, DO   loperamide (IMODIUM) capsule 2 mg, 2 mg, Oral, BID, Maczis, Elmer Sow, PA-C, 2 mg at 09/09/22 2325   melatonin tablet 10 mg, 10 mg, Oral, QHS PRN, Carollee Herter, DO, 10 mg at 09/04/22 2257   melatonin tablet 3 mg, 3 mg, Oral, QHS, Adhikari, Amrit, MD, 3 mg at 09/09/22 2325   metroNIDAZOLE (FLAGYL) tablet 500 mg, 500 mg, Oral, Q12H, Leander Rams, RPH, 500 mg at 09/10/22 2542   morphine (PF) 2 MG/ML injection 2-4 mg, 2-4 mg, Intravenous, Q2H PRN, Diamantina Monks, MD, 2 mg at 09/08/22 2022   multivitamin with minerals tablet 1 tablet, 1 tablet, Oral, Daily, Leander Rams, RPH, 1 tablet at 09/08/22 1234   naloxone [facility]) injection 0.4 mg, 0.4 mg, Intravenous, PRN, Cheri Fowler, MD, 0.4 mg at 08/24/22 1836   ondansetron (ZOFRAN) tablet 4 mg, 4 mg, Oral, Q6H PRN **OR** ondansetron (ZOFRAN) injection 4 mg, 4 mg, Intravenous, Q6H PRN, Mikey College T, MD, 4 mg at 09/10/22 1606   Oral care mouth rinse, 15 mL, Mouth Rinse, PRN, Osvaldo Shipper, MD   oxyCODONE (Oxy IR/ROXICODONE) immediate release tablet 5-10 mg, 5-10 mg, Oral, Q4H PRN, Leander Rams, RPH, 5 mg at 09/09/22 0323   pantoprazole (PROTONIX) injection 40 mg, 40 mg, Intravenous, Q12H, Mikey College T, MD, 40 mg at 09/11/22 0950   polycarbophil (FIBERCON) tablet 625 mg, 625 mg, Oral, BID, Willeen Niece, MD, 625 mg at 09/10/22 0854   predniSONE (DELTASONE) tablet 50 mg, 50 mg, Oral, Q6H, Han, Aimee H, PA-C   promethazine (PHENERGAN) 12.5 mg in sodium chloride 0.9 % 50 mL IVPB, 12.5 mg, Intravenous, Q6H PRN, Howerter, Justin B, DO   revefenacin (YUPELRI) nebulizer solution 175 mcg, 175 mcg, Nebulization, Daily, Sood, Vineet, MD, 175 mcg at 09/11/22 0735   sertraline (ZOLOFT) tablet 150 mg, 150 mg,  Oral, Daily, Khatri, Pardeep, MD, 150 mg at 09/07/22 1111   sodium chloride flush (NS) 0.9 % injection 10-40 mL, 10-40 mL, Intracatheter, Q12H, Adhikari, Amrit, MD, 10 mL at 09/11/22 0952   sodium chloride flush (NS) 0.9 % injection 10-40 mL, 10-40 mL, Intracatheter, PRN, Adhikari, Amrit, MD   sodium chloride flush (NS) 0.9 % injection 5 mL, 5 mL, Intracatheter, Q8H, Sterling Big, MD, 5 mL at 09/11/22 0748   tamsulosin (FLOMAX) capsule 0.4 mg, 0.4 mg, Oral, Daily, Adhikari, Amrit, MD, 0.4 mg at 09/10/22 0854   zinc sulfate capsule 220 mg, 220 mg, Oral, Daily, Khatri, Pardeep, MD, 220 mg at 09/09/22 1221  Labs CBC    Component Value Date/Time   WBC 13.1 (H) 09/10/2022 0340   RBC 2.70 (L) 09/10/2022 0340   HGB 8.5 (L) 09/10/2022 1605   HCT 28.6 (L) 09/10/2022 1605  PLT 1,066 (HH) 09/10/2022 0340   MCV 84.8 09/10/2022 0340   MCH 24.8 (L) 09/10/2022 0340   MCHC 29.3 (L) 09/10/2022 0340   RDW 27.3 (H) 09/10/2022 0340   LYMPHSABS 0.7 04/24/2022 1011   MONOABS 0.4 04/24/2022 1011   EOSABS 0.0 04/24/2022 1011   BASOSABS 0.3 (H) 04/24/2022 1011    CMP     Component Value Date/Time   NA 134 (L) 09/10/2022 0340   K 4.1 09/10/2022 0340   CL 113 (H) 09/10/2022 0340   CO2 14 (L) 09/10/2022 0340   GLUCOSE 99 09/10/2022 0340   BUN 51 (H) 09/10/2022 0340   CREATININE 1.62 (H) 09/10/2022 0340   CALCIUM 9.6 09/10/2022 0340   PROT 4.6 (L) 09/04/2022 0405   ALBUMIN 1.5 (L) 09/04/2022 0405   AST 18 09/04/2022 0405   ALT 11 09/04/2022 0405   ALKPHOS 71 09/04/2022 0405   BILITOT 0.1 (L) 09/04/2022 0405   GFRNONAA 34 (L) 09/10/2022 0340   GFRAA >60 05/11/2015 0231    Lipid Panel     Component Value Date/Time   CHOL 97 08/26/2022 1659   TRIG 122 08/26/2022 1659   HDL 15 (L) 08/26/2022 1659   CHOLHDL 6.5 08/26/2022 1659   VLDL 24 08/26/2022 1659   LDLCALC 58 08/26/2022 1659    Lab Results  Component Value Date   HGBA1C 5.4 08/27/2022     Imaging I have reviewed images in  epic and the results pertinent to this consultation are: CT head-eUnresolved cytotoxic edema in the right MCA territory since the MRI on 08/26/2022.  Other infarcts at that time appear less apparent.  No new cortically based infarct or acute intracranial abnormality identified at this time. CT angio head and neck-no emergent LVO  Assessment: 72 year old woman hypertension hyperlipidemia HFrEF, thrombocytopenia, COPD asthma depression presenting initially with generalized weakness abdominal pain and anemia concerning for upper GI bleed and IDA.  Workup concerning for colon perforation.  Emergent ex lap, right hemicolectomy, ileostomy with fistula and biopsy of pelvic mass done earlier this month.  Patient had left-sided weakness postop and had bilateral anterior posterior circulation infarct worse in the right hemisphere than left concerning for hypoperfusion in the setting of severe anemia and septic shock with hypotension.  Also possible hypercoagulable state.  Prior to that has been incidental punctate infarct in the right parietal lobe right frontal semiovale and cerebellum.  Stroke workup has been completed.  She is currently anticoagulated. Stroke code was activated for left-sided weakness but based on my chart review her weakness seems to be old.  Patient is unable to tell me if she was ever able to use her left arm.  I also do not have good documentation of exams after neurology signed off on the patient to be able to be certain.  Impression: Evaluate for recrudescence of stroke symptoms in the setting of acute illness versus new stroke.  Recommendations: MRI brain without contrast-to make sure there is no expansion of the stroke. Given the recent strokes and poor baseline, she is not a candidate for any emergent interventions. Continue medical management per primary team. Okay to continue to use her blood thinners Will follow imaging results with you Plan discussed with Dr. Jarvis Newcomer  -- Milon Dikes, MD Neurologist [facility] Pager: [phone]   CRITICAL CARE ATTESTATION Performed by: Milon Dikes, MD Total critical care time: 50 minutes Critical care time was exclusive of separately billable procedures and treating other patients and/or supervising APPs/Residents/Students Critical care was necessary to  treat or prevent imminent or life-threatening deterioration. This patient is critically ill and at significant risk for neurological worsening and/or death and care requires constant monitoring. Critical care was time spent personally by me on the following activities: development of treatment plan with patient and/or surrogate as well as nursing, discussions with consultants, evaluation of patient's response to treatment, examination of patient, obtaining history from patient or surrogate, ordering and performing treatments and interventions, ordering and review of laboratory studies, ordering and review of radiographic studies, pulse oximetry, re-evaluation of patient's condition, participation in multidisciplinary rounds and medical decision making of high complexity in the care of this patient.

## 2022-09-11 NOTE — Telephone Encounter (Signed)
Spoke to patients son and confirmed upcoming appointment, advised we are aware patient is currently in hospital and if she is in there longer and appointment needs to be moved we will move it and let him know, so he is aware of current appointment arrival time and location, he asked for now communication of appointments go through him while his mother is in hospital

## 2022-09-11 NOTE — Progress Notes (Signed)
Referring Physician(s): Axel Filler   Supervising Physician: Gilmer Mor  Patient Status:  [facility] - In-pt  Chief Complaint:  Exploratory laparotomy, right hemicolectomy, ileostomy, mucus fistula, biopsy of pelvic mass by Dr. Bedelia Person 7/6 for Colonic perforation secondary to obstructing pelvic mass  - CT finding of right hydronephrosis s/p R PCN placement by Dr. Archer Asa on 7/15 - CT finding of persistent intraabdominal fluid collection, s/p RLQ drain placement by Dr. Archer Asa on 7/20  Patient has surgical drain and ostomy bag   Subjective:  Patient laying in bed, sleeping, NAD.  States that she is doing fine, no complaints at the moment.  RN states that she had large volume of green vomit this morning.  Asked patient why she declined allergy prep last night, she states that she does not know why.  Encouraged patient to allow nursing staff to prepare her for IV contrast so that she can get CT and possible lose some of her drains. She verbalized understanding.   Allergies: Iodinated contrast media, Aspirin, Ibuprofen, Sulfonamide derivatives, and Penicillins  Medications: Prior to Admission medications   Medication Sig Start Date End Date Taking? Authorizing Provider  cetirizine (ZYRTEC) 10 MG tablet Take 1 tablet (10 mg total) by mouth daily. Patient taking differently: Take 10 mg by mouth daily as needed for allergies or rhinitis. 10/09/19  Yes Hawks, Christy A, FNP  ferrous sulfate 325 (65 FE) MG EC tablet Take 325 mg by mouth daily.   Yes [provider]  fluticasone (FLONASE) 50 MCG/ACT nasal spray Place 2 sprays into both nostrils daily. Patient taking differently: Place 2 sprays into both nostrils at bedtime as needed for allergies or rhinitis. 10/09/19  Yes Hawks, Christy A, FNP  lisinopril (ZESTRIL) 40 MG tablet Take 40 mg by mouth in the morning.   Yes [provider]  lovastatin (MEVACOR) 20 MG tablet Take 20 mg by mouth at bedtime.   Yes  [provider]  PROAIR HFA 108 (90 Base) MCG/ACT inhaler Inhale 2 puffs into the lungs every 6 (six) hours as needed for wheezing or shortness of breath.   Yes [provider]  sertraline (ZOLOFT) 100 MG tablet Take 150 mg by mouth in the morning.   Yes [provider]  SYMBICORT 160-4.5 MCG/ACT inhaler Inhale 2 puffs into the lungs in the morning and at bedtime.   Yes [provider]     Vital Signs: BP 134/62 (BP Location: Left Arm)   Pulse 90   Temp 98.1 F (36.7 C) (Oral)   Resp 12   Ht 5' 5.98" (1.676 m)   Wt 171 lb 15.3 oz (78 kg)   SpO2 100%   BMI 27.77 kg/m   Physical Exam Vitals reviewed.  Constitutional:      General: She is not in acute distress.    Comments: Sleeping   HENT:     Head: Normocephalic.  Pulmonary:     Effort: Pulmonary effort is normal.  Abdominal:     General: Abdomen is flat.     Palpations: Abdomen is soft.     Comments: + R PCN - brown fluid in the gravity bag + RLQ drain - tan, purulent fluid in the suction bulb, ~ 10 mL or less   + ostomy  + surgical drain   Skin:    General: Skin is warm and dry.  Psychiatric:        Mood and Affect: Mood normal.        Behavior: Behavior normal.  Imaging: No results found.  Labs:  CBC: Recent Labs    09/07/22 0234 09/08/22 0656 09/09/22 0340 09/10/22 0340 09/10/22 1605  WBC 15.4* 15.8* 15.7* 13.1*  --   HGB 8.0* 7.4* 7.5* 6.7* 8.5*  HCT 28.8* 25.0* 25.7* 22.9* 28.6*  PLT 1,239* 1,173* 1,172* 1,066*  --     COAGS: Recent Labs    08/21/22 0259 09/05/22 0037  INR 1.1 1.2    BMP: Recent Labs    09/07/22 0234 09/08/22 0656 09/09/22 0340 09/10/22 0340  NA 134* 136 135 134*  K 4.5 4.1 4.2 4.1  CL 112* 115* 113* 113*  CO2 14* 16* 14* 14*  GLUCOSE 134* 107* 91 99  BUN 43* 47* 49* 51*  CALCIUM 8.9 9.2 10.0 9.6  CREATININE 1.42* 1.37* 1.49* 1.62*  GFRNONAA 40* 42* 38* 34*    LIVER FUNCTION TESTS: Recent Labs    08/22/22 2052  08/23/22 0922 08/31/22 0150 09/04/22 0405  BILITOT 1.9* 1.4* 0.5 0.1*  AST 9* 14* 17 18  ALT 8 7 12 11   ALKPHOS 44 35* 91 71  PROT 4.6* 4.6* 4.7* 4.6*  ALBUMIN 2.7* 2.4* 1.5* 1.5*    Assessment and Plan:  72 y.o. female with Colonic perforation and R hydronephrosis secondary to obstructing pelvic mass, s/p exploratory laparotomy, right hemicolectomy, ileostomy, mucus fistula, biopsy of pelvic mass by Dr. Bedelia Person 7/6, s/p R PCN placement on 7/15 and RLQ drain placement on 7/20 both performed by Dr. Archer Asa.   Drain output has been minimal for several days therefore f/u CT A/P with IV contrast was ordered, patient is allergic to contrast and 13 hr prep was ordered but patient refused to take prednisone and benadryl - patient does not know why she refused the allergy prep.   Output from the RLQ drain suddenly increased yesterday, from 5 mL on 7/24-25 to 148 L 7/25-26 - Discussed with RN this morning, she states that she got report from overnight RN that the output from the RLQ drain is only 5 mL.   No labs today, CBC on 7/25 showed trending down WBC and drop in hgb to 6.7, she received PRBC yesterday.   DRAIN EXAM TODAY  - output is tan colored, purulent this morning. Around 5 mL of fluid in the suction bulb.  - F/A well.  - R PCN with dark brown fluid, output stable but RF has been declining.   PLAN - Will attempt 13 hr prep tonight, CT A/P with IV to be obtained at Saturday 10 am.  - if patient continued to decline prep, will need to discuss with IR radiologist and may perform CT w/o.    Electronically Signed: Willette Brace, PA-C 09/11/2022, 10:37 AM   I spent a total of 25 Minutes at the the patient's bedside AND on the patient's hospital floor or unit, greater than 50% of which was counseling/coordinating care for RLQ drain f/u.   This chart was dictated using voice recognition software.  Despite best efforts to proofread,  errors can occur which can change the documentation  meaning.

## 2022-09-11 NOTE — Progress Notes (Signed)
PT Cancellation Note  Patient Details Name: Debra Sharp MRN: [id number] DOB: 27-Dec-1950   Cancelled Treatment:    Reason Eval/Treat Not Completed: Patient not medically ready.  Pt lethargic post code stroke with benadryl given for contrast MRI.  Will see as able this w/e or make priority as appropriate Monday. 09/11/2022  Jacinto Halim., PT Acute Rehabilitation Services [phone]  (office)    [name] 09/11/2022, 2:11 PM

## 2022-09-11 NOTE — Code Documentation (Signed)
Stroke Response Nurse Documentation Code Documentation  Debra Sharp is a 72 y.o. female admitted to [facility]  on 08/18/2022 for Acute on chronic iron deficiency, blood loss anemia with past medical hx of anemia, COPD, HTN, HLD. On Eliquis (apixaban) daily. Code stroke was activated by 4E RN and RRT .   Patient on 4E unit where she was LKW at Unclear time and now complaining of left sided weakness. Report from provider is that patient was moving all four extremities this morning. This afternoon, they noted that she had altered mental status and not moving her left side. Code Stroke called. After review of chart, patient arrived with left sided weakness, decreased sensation, and right gaze preference.  Stroke team at the bedside after patient activation. Patient to CT with team. NIHSS 17, see documentation for details and code stroke times. Patient with decreased LOC, right gaze preference , left facial droop, bilateral, left arm weakness, bilateral leg weakness, left decreased sensation, and Expressive aphasia  on exam. The following imaging was completed:  CT Head and CTA. Patient is not a candidate for IV Thrombolytic due to outside window. Patient is not a candidate for IR due to no LVO on imaging per provider.   Care/Plan: Q4 NIHSS/VS.   Bedside handoff with RN 4E.    Lucila Maine  Stroke Response RN

## 2022-09-11 NOTE — Significant Event (Signed)
Rapid Response Event Note   Reason for Call :  Change in mental status, concern for stroke  Initial Focused Assessment:  Patient is drowsy.  She is able to answer questions and follow commands.  Her left arm and leg are flaccid.  She is neglecting her left side.  She has decreased sensory on her left leg.  She has a known CVA from earlier in this admission.  Per staff she has been moving her left side with some weakness.  Last known well is unclear.     BP 154/56  HR 88  RR 16  O2 sat 100% on RA Temp 97.7 oral CBG  74  Dr Jarvis Newcomer came to bedside  Interventions:  Code Stroke activation for possible LVO, worsening left weakness and neglect  CT/CTA head neck CTA abdomen pelvis  Plan of Care:  NIHSS and VS q 4 hours x 3   Event Summary:   MD Notified: Dr Jarvis Newcomer, Dr Wilford Corner Call Time: 1151 Arrival Time: 1154 End Time: 1320  Marcellina Millin, RN

## 2022-09-11 NOTE — Progress Notes (Signed)
[facility] PROGRESS NOTE  Debra Sharp (DOB: 02/08/1951) ELF:[id number] PCP: Clayborn Heron, MD  Brief Narrative: Debra Sharp is a 72 y.o. female 72 year old female with past medical history of hypertension, hyperlipidemia, systolic CHF, thrombocytopenia, COPD, asthma who initially presented with weakness, abdominal pain. Workup showed anemia concerning for upper GI bleed. CT abdomen/pelvis showed colonic distention, possible obstruction. GI consulted, plan was for EGD but she could not tolerate prep. Also found to have right-sided hydronephrosis, incidental acute stroke on CT/MRI. Repeat CT abdomen/pelvis on 7/6 showed new intraperitoneal free air with presumed perforation of the colon. Surgery consulted and she underwent emergent exploratory laparotomy, right hemicolectomy, ileostomy with fistula, biopsy of pelvic mass. Patient was left intubated and was transferred to ICU for pain management. 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 Palliative care consulted. General surgery following. Currently on tube feeding. Hospital course also remarkable for finding of acute PE now on heparin drip. Now extubated and transferred to [facility] service on 7/12. Plan was for TEE which has been canceled because patient declined. Palliative care, urology ,surgery ,Neurology were following. Currently titrating tube feeds. Surgery following. Patient underwent new percutaneous drain in the right lower quadrant.    Subjective: Pain in abd this AM, not different from prior. Had green emesis this morning as well. Has not eaten anything.   Objective: BP (!) 169/68 (BP Location: Left Arm)   Pulse (!) 102   Temp 97.6 F (36.4 C) (Oral)   Resp 18   Ht 5' 5.98" (1.676 m)   Wt 78 kg   SpO2 99%   BMI 27.77 kg/m   Gen: No acute distress, alert and interactive Pulm: Clear, nonlabored CV: RRR, no MRG, trace edema GI: Soft, NT, mildly distended, midline incision without exudate or erythema, ostomy  site appears pink and healthy, JP drain with milky output.  Neuro: Alert and interactive, follows commands. Has left arm weakness that became more complete this afternoon. Still has reflexes. LLE recoils to babinski (negative) bilaterally. May be neglecting left side.  Ext: Warm, dry Skin: No new rashes, lesions or ulcers on visualized skin   Assessment & Plan: Septic shock secondary to acute peritonitis: Colonic perforation due to obstructing pelvic mass:  Status post exploratory laparotomy with right hemicolectomy, ileostomy creation, mucus fistual formation, biopsy of pelvic mass, and intraoperative of vasculature using indocyanine green.   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   Currently on broad spectrum anti-infectives with micafungin, Flagyl, cefepime.  culture NGTD.   On tube feeds, will continue for now until she's able to maintain adequate PO intake by mouth. RD c/s for tube feeds.  General surgery following, appreciate recs.   IR had been consulted for drainage of intraabdominal fluid collections, but they thought intraabdominal fluid collections are small and appeared c/w post op blood Repeat CT scan being considered by surgery if worsening fever, leukocytosis, etc Leukocytosis persist.  CT 7/13 with small volume fluid within abdomen and pelvis with several collections demonstrating rim enhancement suggestive of abscesses, new, near complete hypoattenuation of splenic parenchyma with subcapsular low density fluid along peripheral margin of spleen, multiple mildly dilated smal bowel loops with numerous air fluid levels (post op ileus), severe R hydroureteronephrosis, new mild left hydronephrosis.  Small bilateral effusions.  Large complex mass occupying majority of low right pelvis.  Patient underwent new percutaneous drain in the RLQ, ID consulted to decide about antibiotics regimen at discharge. - CT abd/pelvis repeated today, given contrast since she was  given it  with head anyway. Results pending.   Newly Diagnosed Terminal Ileum Adenocarcinoma with Peritoneal Metastasis:  Culprit for colonic perforation.  Initially concern for gynecological malignancy with elevated CA125, CA 19-19 but pathology showed small bowel primary.  Appreciate Dr. Latanya Maudlin recommendations.  Possible chemo after she recovers in 1-2 months.  ? Gyn follow up for debulking surgery of pelvic mass.  Overall prognosis is poor.  Recommending indefinite anticoagulation. Heparin is being switched to DOAC. Palliative care consulted, patient wants to have full scope of care.   Chronic systolic CHF:  Echo on 08/18/2020 showed EF of 40 to 45%, global hypokinesis.  Previous echo on March 2024 showed EF of 30 to 35%.  We recommend follow-up with cardiology as an outpatient. She has bilateral LE edema - follow BNP 117 She appears euvolemic.    Acute metabolic encephalopathy:  > Resolved. Likely from sepsis, delirium, CVAs.   -repeat CT brain neg for acute findings. She is much alert, following commands.    Acute CVA: MRI done on 7/10 with acute and late acute infarcts in multiple vascular territories consistent with central embolic source.    - Considerations of etiology were embolic (not ruled out as pt declined TEE, does have hypercoagulability due to malignancy) and hypoperfusion in setting of severe anemia and septic shock with hypotension at that time.  - CTA 7/26 as part of code stroke does show new severe right P2 PCA stenosis. Not interventional candidate.  - Continue atorvastatin 20 mg.  - Continue eliquis (ok'ed per neurology).  - Left UE weakness by report a bit worse today, code stroke activated, d/w Dr. Wilford Corner.     Acute PE: CT angiogram showed acute PE.  No evidence of right heart strain.   - Continue eliquis.   AKI / Hyperkalemia/ Hypophosphatemia: Currently kidney function are stable. Phosphorus being supplemented.   Hydronephrosis secondary to abdominal mass:  Follow-up CT showed persistent severe right hydroureteronephrosis.  IR did  PCNL placement on 7/15 (pulled overnight 7/15) - Urology saw pt 7/16--and still flushes easily. - Mild left hydro will need to be followed outpatient. Will see on today's CT as well.   Acute on chronic anemia of renal disease /Acute blood loss anemia secondary to surgery/chronic GI bleed due to small bowel adenocarcinoma:  - Received 6u total RBCs this admission, last 7/25.  - Continue PPI, monitor CBC again in AM.   Hypertension: Stable   Hyperlipidemia:  - Continue statin   Hyperglycemia: A1c 5.4% - Continue SSI.   Left upper extremity edema: Negative venous U/S.  - Elevate extremity.    Thrombocytosis: Reactive.   Goals of care: Multiple comorbidities.  Palliative care were following.   - Remains full code/full scope of care.   Tyrone Nine, MD [facility] [email or web address] 09/11/2022, 1:55 PM

## 2022-09-11 NOTE — Progress Notes (Signed)
20 Days Post-Op   Subjective/Chief Complaint: No abdominal pain, n/v. Reports she did not eat anything in the last 24 hours. No ostomy output recorded in the last 24 hours - discussed with RN - confirmed no I/O recorded from yesterday but patient has obvious output in ostomy bag and in report from overnight the RN was told the output was more formed. Asked RN to ensure we have strict I/O moving forward.   Afebrile. No tachycardia or systolic hypotension. Hgb 8.5 today after PRBC but otherwise no labs done today.   Objective: Vital signs in last 24 hours: Temp:  [97.6 F (36.4 C)-98.7 F (37.1 C)] 98.1 F (36.7 C) (07/26 0734) Pulse Rate:  [77-97] 90 (07/26 0734) Resp:  [10-21] 12 (07/26 0734) BP: (101-134)/(44-62) 134/62 (07/26 0734) SpO2:  [99 %-100 %] 100 % (07/26 0735) FiO2 (%):  [21 %] 21 % (07/25 1339) Last BM Date : 09/10/22  Intake/Output from previous day: 07/25 0701 - 07/26 0700 In: 707 [P.O.:360; Blood:342] Out: 583 [Urine:425; Drains:158] Intake/Output this shift: No intake/output data recorded.  Gen: Alert, NAD, pleasant Abd: Soft, mild distension, NT. Stoma and mucus fistula viable - ostomy appliance with liquid brown stool in eakins bag.  Midline wound with some fibrinous tissue at the base but otherwise clean. L surgical JP drain stable with purulent/milky output today. IR drain output scant and difficult to characterize.   Lab Results:  Recent Labs    09/09/22 0340 09/10/22 0340 09/10/22 1605  WBC 15.7* 13.1*  --   HGB 7.5* 6.7* 8.5*  HCT 25.7* 22.9* 28.6*  PLT 1,172* 1,066*  --    BMET Recent Labs    09/09/22 0340 09/10/22 0340  NA 135 134*  K 4.2 4.1  CL 113* 113*  CO2 14* 14*  GLUCOSE 91 99  BUN 49* 51*  CREATININE 1.49* 1.62*  CALCIUM 10.0 9.6   PT/INR No results for input(s): "LABPROT", "INR" in the last 72 hours. ABG No results for input(s): "PHART", "HCO3" in the last 72 hours.  Invalid input(s): "PCO2", "PO2"  Studies/Results: No  results found.  Anti-infectives: Anti-infectives (From admission, onward)    Start     Dose/Rate Route Frequency Ordered Stop   09/06/22 1200  ceFEPIme (MAXIPIME) 2 g in sodium chloride 0.9 % 100 mL IVPB        2 g 200 mL/hr over 30 Minutes Intravenous Every 12 hours 09/06/22 0920     09/04/22 2200  metroNIDAZOLE (FLAGYL) tablet 500 mg        500 mg Oral Every 12 hours 09/04/22 0741     08/30/22 0830  ceFEPIme (MAXIPIME) 2 g in sodium chloride 0.9 % 100 mL IVPB  Status:  Discontinued        2 g 200 mL/hr over 30 Minutes Intravenous Every 8 hours 08/30/22 0826 09/06/22 0920   08/23/22 2100  ceFEPIme (MAXIPIME) 2 g in sodium chloride 0.9 % 100 mL IVPB  Status:  Discontinued        2 g 200 mL/hr over 30 Minutes Intravenous Every 12 hours 08/23/22 1357 08/30/22 0826   08/23/22 0400  micafungin (MYCAMINE) 100 mg in sodium chloride 0.9 % 100 mL IVPB  Status:  Discontinued        100 mg 105 mL/hr over 1 Hours Intravenous Daily 08/23/22 0242 09/08/22 1029   08/22/22 1800  metroNIDAZOLE (FLAGYL) IVPB 500 mg  Status:  Discontinued        500 mg 100 mL/hr over 60 Minutes Intravenous  Every 12 hours 08/22/22 1711 09/04/22 0741   08/22/22 1800  ceFEPIme (MAXIPIME) 2 g in sodium chloride 0.9 % 100 mL IVPB  Status:  Discontinued        2 g 200 mL/hr over 30 Minutes Intravenous Every 8 hours 08/22/22 1716 08/23/22 1357   08/22/22 1724  metroNIDAZOLE (FLAGYL) 500 MG/100ML IVPB       Note to Pharmacy: [name]: cabinet override      08/22/22 1724 08/23/22 0845   08/19/22 1030  cefTRIAXone (ROCEPHIN) 1 g in sodium chloride 0.9 % 100 mL IVPB  Status:  Discontinued        1 g 200 mL/hr over 30 Minutes Intravenous Every 24 hours 08/19/22 0937 08/22/22 1715       Assessment/Plan: POD 20 s/p exploratory laparotomy, right hemicolectomy, ileostomy, mucus fistula, biopsy of pelvic mass by Dr. 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 Margins negative. 0/43 lymph nodes.  - Discussed case with attending - given mass is cancerous, the concern is that this was not resectable during her initial operation. If this progresses and causes another obstruction I think her options would be limited surgically. Dr. Mosetta Putt of Oncology has seen and felt her cancer is likely incurable and her overall prognosis is poor. They will plan to see her back in 4-6 weeks to discuss options for chemo. Palliative is following - patient wishes to proceed with full code/full scope - Ileostomy was high output. Cont strict I/O. Cont BID fiber, BID iron BID imodium and BID Lomotil. Monitor output, Cr, electrolytes. - WOC for stoma teaching and assistance with pouching. - BID wet to dry to midline - CT 7/14 obtained for drain appearing feculent - this showed intra-abdominal fluid collections. IR consulted for drainage but felt the intra-abdominal fluid collections are small and look like post-op blood. Repeat CT 7/19 similar. S/p IR drain 7/20 with tubry cloudy fluid.  - Cx from IR drain with pseudomonas and strep anginosis. Abx per ID. Repeat CT planned for today by IR/ID. We will follow up on this.  - Cont JP drain - no longer feculent  - IR drain and flushes per IR.  - Mobilize, PT - rec for SNF    FEN: Soft diet. Shakes. Noted patient is not meeting nutritional needs after cal count has been completed. Patient is ~3 weeks out from surgery above. Would not recommend any surgically placed feeding access at this time. Can start with cortrak and TF's while here. Would favor nocturnal TF's to supplement and encourage her to continue to eat during the daytime. As she gets closer to d/c we can reassess how her oral intake is VTE: SCDs, Eliquis  ID: Cefepime/flagyl per ID Foley: Removed, Monitor. Urology following. S/p R PCN Dispo - As above   Per [facility] New stroke - Seen on MRI 7/10, suspected  thromboembolic, defer to primary team/stroke team for further w/u and management.  Anemia - [facility] has ordered PRBC PE HTN HLD GERD COPD R hydro - s/p R PCN CT 7/14 also suggestion of splenic infarct and bilateral pleural effusions    [facility] 09/11/2022

## 2022-09-11 NOTE — Plan of Care (Signed)
Notified by neuroradiologist that she has a new severe stenosis in the right P2 PCA which is new from the prior CT angiogram. At this time, not a candidate for emergent interventions due to recent stroke.  -- Milon Dikes, MD Neurologist [facility] Pager: [phone]

## 2022-09-11 NOTE — Progress Notes (Signed)
Contacted by MRI staff for ordered Brain MRI, since the patient is confused and agitated, physician was contacted for IV med to facilitate MRI. Orders given.

## 2022-09-12 DIAGNOSIS — G8102 Flaccid hemiplegia affecting left dominant side: Secondary | ICD-10-CM

## 2022-09-12 DIAGNOSIS — K922 Gastrointestinal hemorrhage, unspecified: Secondary | ICD-10-CM | POA: Diagnosis not present

## 2022-09-12 LAB — BASIC METABOLIC PANEL
Anion gap: 7 (ref 5–15)
BUN: 55 mg/dL — ABNORMAL HIGH (ref 8–23)
CO2: 14 mmol/L — ABNORMAL LOW (ref 22–32)
Calcium: 10.3 mg/dL (ref 8.9–10.3)
Chloride: 119 mmol/L — ABNORMAL HIGH (ref 98–111)
Creatinine, Ser: 1.87 mg/dL — ABNORMAL HIGH (ref 0.44–1.00)
GFR, Estimated: 29 mL/min — ABNORMAL LOW (ref >60)
Glucose, Bld: 109 mg/dL — ABNORMAL HIGH (ref 70–99)
Potassium: 3.9 mmol/L (ref 3.5–5.1)
Sodium: 140 mmol/L (ref 135–145)

## 2022-09-12 LAB — GLUCOSE, CAPILLARY
Glucose-Capillary: 104 mg/dL — ABNORMAL HIGH (ref 70–99)
Glucose-Capillary: 94 mg/dL (ref 70–99)
Glucose-Capillary: 102 mg/dL — ABNORMAL HIGH (ref 70–99)

## 2022-09-12 LAB — HEMOGLOBIN AND HEMATOCRIT, BLOOD
HCT: 26.9 % — ABNORMAL LOW (ref 36.0–46.0)
Hemoglobin: 7.9 g/dL — ABNORMAL LOW (ref 12.0–15.0)

## 2022-09-12 MED ORDER — INSULIN ASPART 100 UNIT/ML IJ SOLN: 0.0000 [IU] | Freq: Every day | INTRAMUSCULAR | Status: DC

## 2022-09-12 MED ORDER — INSULIN ASPART 100 UNIT/ML IJ SOLN: 0.0000 [IU] | Freq: Three times a day (TID) | INTRAMUSCULAR | Status: DC

## 2022-09-12 MED ORDER — SODIUM BICARBONATE 8.4 % IV SOLN
INTRAVENOUS | Status: AC
  Filled 2022-09-12 (×2): qty 1000

## 2022-09-12 MED ORDER — HALOPERIDOL LACTATE 5 MG/ML IJ SOLN
5.0000 mg | Freq: Four times a day (QID) | INTRAMUSCULAR | Status: DC | PRN
  Administered 2022-09-12: 5 mg via INTRAVENOUS
  Filled 2022-09-12: qty 1

## 2022-09-12 NOTE — Progress Notes (Addendum)
STROKE TEAM PROGRESS NOTE   BRIEF HPI [name] is a 72 y.o. female with history of HTN, HLD, HFrEF, thrombocytopenia, COPD, asthma, and depression presenting initially with generalized weakness, abdominal pain, and anemia concerning for UGIB and IDA with complications during admission including colon perforation, emergent ex lap, right hemicolectomy, ileostomy with fistula, biopsy of pelvic mass 08/22/2022, incidental punctate infarcts at the right parietal lobe, left frontal centrum semiovale, and right cerebellum due to small vessel disease versus embolic from malignancy 7/4, and new onset of left-sided weakness following extubation 08/24/2022.  MRI revealed acute and late acute infarcts in multiple vascular territories concerning for hypoperfusion in the setting of severe anemia and septic shock with hypotension.  Stroke team evaluated patient and signed off with recommendations of Plavix per GI team.  SIGNIFICANT HOSPITAL EVENTS 7/2: Admission to hospital 7/4: Incidental punctate infarcts right parietal, left frontal centrum semiovale, and right cerebellar 7/6: Emergent ex lap due to colon perforation, right hemicolectomy, ileostomy with fistula, biopsy of pelvic mass.  Severe anemia, septic shock. 7/8: Extubation with new onset of left-sided weakness.   7/10: MRI with acute and late acute infarcts in multiple vascular territories concerning for hypoperfusion injury. 7/12: IV heparin initiated for acute PE treatment, neurology signed off  7/26: Code stroke activated for left-sided flaccid weakness, left-sided neglect.  CT angio obtained revealing a new severe stenosis in the right P2 PCA.  Patient is not a candidate for interventions due to recent stroke.  INTERIM HISTORY/SUBJECTIVE Son is at bedside on examination this morning, neurologic exam is stable.  Patient appears to have some improvement in left lower extremity weakness on examination this morning. VSS.  Patient's son does express  concern with decreased oral intake as patient was transition to n.p.o. recently. MRI ordered but is pending patient appears to having proved mentation but left upper extremity remains paralyzed OBJECTIVE  CBC    Component Value Date/Time   WBC 12.3 (H) 09/12/2022 0455   RBC 2.81 (L) 09/12/2022 0455   HGB 7.1 (L) 09/12/2022 0455   HCT 24.2 (L) 09/12/2022 0455   PLT 978 (HH) 09/12/2022 0455   MCV 86.1 09/12/2022 0455   MCH 25.3 (L) 09/12/2022 0455   MCHC 29.3 (L) 09/12/2022 0455   RDW 26.3 (H) 09/12/2022 0455   LYMPHSABS 0.7 04/24/2022 1011   MONOABS 0.4 04/24/2022 1011   EOSABS 0.0 04/24/2022 1011   BASOSABS 0.3 (H) 04/24/2022 1011   BMET    Component Value Date/Time   NA 134 (L) 09/12/2022 0455   K 5.7 (H) 09/12/2022 0455   CL 115 (H) 09/12/2022 0455   CO2 11 (L) 09/12/2022 0455   GLUCOSE 450 (H) 09/12/2022 0455   BUN 52 (H) 09/12/2022 0455   CREATININE 1.78 (H) 09/12/2022 0455   CALCIUM 9.3 09/12/2022 0455   GFRNONAA 30 (L) 09/12/2022 0455   IMAGING past 24 hours CT ABDOMEN PELVIS W CONTRAST  Result Date: 09/11/2022 CLINICAL DATA:  History of colonic perforation secondary to obstructive pelvic mass. Evaluate for abdominal abscess. EXAM: CT ABDOMEN AND PELVIS WITH CONTRAST TECHNIQUE: Multidetector CT imaging of the abdomen and pelvis was performed using the standard protocol following bolus administration of intravenous contrast. RADIATION DOSE REDUCTION: This exam was performed according to the departmental dose-optimization program which includes automated exposure control, adjustment of the mA and/or kV according to patient size and/or use of iterative reconstruction technique. CONTRAST:  60mL OMNIPAQUE IOHEXOL 350 MG/ML SOLN COMPARISON:  CT abdomen pelvis 09/04/2022 FINDINGS: Lower chest: Small bilateral pleural  effusions with compressive atelectasis at the lung bases. Hepatobiliary: Stable appearance of the liver and gallbladder. No significant gallbladder distension. Again  noted is a small low-density structure in the right hepatic lobe on image 39/3 that is too small to definitively characterize. No significant biliary dilatation. Pancreas: Stable appearance of the pancreas without significant inflammatory changes. Spleen: Again noted is marked low-density throughout the spleen with residual areas of contrast enhancement. Findings are suggestive for a large splenic infarct. Trace fluid around the spleen. History of splenic vein thrombus but this area is poorly characterized on this examination. Adrenals/Urinary Tract: Normal adrenal glands. Right percutaneous nephrostomy tube is well positioned in the renal pelvis. Persistent dilatation of the right renal collecting system but this has decreased since the previous CT. Mild dilatation of the right ureter. Small low-density in the posterolateral aspect of the right kidney is again noted. Stable appearance of the left kidney with mild dilatation of the left renal pelvis. No significant dilatation of left ureter. Stomach/Bowel: Again noted is a large heterogeneous mass in the central and right side of the pelvis which is abutting or involving the sigmoid colon. Colon is decompressed with a mucous fistula. History of right hemicolectomy. Again noted is an ileostomy. Decreased bowel distension compared to the most recent comparison examination. No evidence for bowel obstruction. Normal appearance of the stomach. Vascular/Lymphatic: Atherosclerotic calcifications in the abdominal aorta without aneurysm. No significant lymph node enlargement in the abdomen or pelvis. Reproductive: Again noted is an enlarged uterus with a large calcified fibroid. Difficult to evaluate the adnexal structures but no significant change from the previous examination. Other: Again noted is a large heterogeneous low-density mass involving the central and right side of the pelvis. This structure roughly measures 10.9 x 6.5 x 10.6 cm. Again noted is a small amount of  gas within this structure. This structure abuts the sigmoid colon and the posterior right side of the uterus. There is a surgical drain that extends between the mass and the uterus. The drain extends up into the right paracolic gutter region. Again noted is a small rim enhancing fluid collection around the superior aspect of the uterus and near the drain entrance site in the anterior pelvis. This small fluid collection has a maximum thickness of 1.4 cm and has minimally changed from the prior examination. Again noted is a small rim enhancing fluid collection in the posterior pelvis adjacent to the rectum. This pelvic fluid collection measures 1.9 x 1.8 cm image 75/3 and previously measured 2.6 x 1.7 cm. A pigtail drain has been placed in the right lateral abdominal fluid collection. This right lateral abdominal fluid collection is markedly smaller in size. There is a small residual collection along the superior aspect of the drain that measures roughly 1.5 cm on image 47/3. Small fluid collection in the right lower quadrant on image 65/3 is decreased in size. Trace fluid in the left lower quadrant has minimally changed. Trace free fluid or perirectal edema in the pelvis. Musculoskeletal: Grade 2 anterolisthesis of L4 on L5 with disc space narrowing. No suspicious osseous lesion. Diffuse subcutaneous edema. IMPRESSION: 1. Decreased bowel distension compared to the exam on 09/04/2022. 2. Right lateral abdominal fluid collection is decompressed since placement of the percutaneous drain. Small amount of residual fluid around the drain. 3. Residual small fluid collections in the lower abdomen and pelvis as described. A complex collection around the uterus and surgical drain is minimally changed. No new fluid or abscess collections in the abdomen or pelvis.  4. Again noted is a complex mass/fluid collection in the pelvis. This pelvic mass has minimally changed. 5. Bilateral pleural effusions with compressive atelectasis at  the lung bases. 6. Decreased distension of the right renal collecting system. Right nephrostomy tube appears to be well positioned. 7. Stable appearance of the splenic infarct. 8. Fibroid uterus. Electronically Signed   By: Richarda Overlie M.D.   On: 09/11/2022 16:22   CT ANGIO HEAD NECK W WO CM (CODE STROKE)  Result Date: 09/11/2022 CLINICAL DATA:  Provided history: Neuro deficit, acute, stroke suspected. EXAM: CT ANGIOGRAPHY HEAD AND NECK WITH AND WITHOUT CONTRAST TECHNIQUE: Multidetector CT imaging of the head and neck was performed using the standard protocol during bolus administration of intravenous contrast. Multiplanar CT image reconstructions and MIPs were obtained to evaluate the vascular anatomy. Carotid stenosis measurements (when applicable) are obtained utilizing NASCET criteria, using the distal internal carotid diameter as the denominator. RADIATION DOSE REDUCTION: This exam was performed according to the departmental dose-optimization program which includes automated exposure control, adjustment of the mA and/or kV according to patient size and/or use of iterative reconstruction technique. CONTRAST:  60mL OMNIPAQUE IOHEXOL 350 MG/ML SOLN COMPARISON:  Noncontrast head CT 09/11/2022. CT angiogram head/neck 08/26/2022. FINDINGS: CTA NECK FINDINGS Aortic arch: Common origin of the innominate and left common carotid arteries. Atherosclerotic plaque within the visualized thoracic aorta and within the proximal major branch vessels of the neck. 70% atherosclerotic stenosis within the proximal right subclavian artery, unchanged from the recent prior CTA of 08/26/2022. No hemodynamically significant stenosis within the proximal left subclavian artery. Right carotid system: CCA and ICA patent within the neck. Atherosclerotic plaque about the carotid bifurcation and within the proximal ICA without hemodynamically significant stenosis (50% or greater). Left carotid system: CCA and ICA patent within the neck.  Atherosclerotic plaque about the carotid bifurcation and within the proximal ICA without hemodynamically significant stenosis (50% or greater). Mild atherosclerotic plaque also present at the CCA origin and within the mid CCA. Vertebral arteries: The right vertebral artery is non dominant and developmentally diminutive, but patent within the neck. Throughout the neck. The left vertebral artery is patent within the neck without stenosis. Mild nonstenotic atherosclerotic plaque at the left vertebral artery origin. Skeleton: Cervical spondylosis. No acute fracture or aggressive osseous lesion. Other neck: Subcentimeter nodule within the left thyroid lobe not meeting consensus criteria for ultrasound follow-up based on size. No follow-up imaging recommended. Reference: J Am Coll Radiol. 2015 Feb;12(2): 143-50. Upper chest: Partially imaged bilateral pleural effusions. Review of the MIP images confirms the above findings CTA HEAD FINDINGS Anterior circulation: The intracranial internal carotid arteries are patent. Atherosclerotic plaque within both vessels with no more than mild stenosis. The M1 middle cerebral arteries are patent. Atherosclerotic irregularity of the M2 and more distal MCA vessels bilaterally. No M2 proximal branch occlusion or high-grade proximal stenosis. The anterior cerebral arteries are patent. Hypoplastic right A1 segment. No intracranial aneurysm is identified. Posterior circulation: The intracranial vertebral arteries are patent. The right vertebral artery remains developmentally diminutive intracranially. The basilar artery is patent. The posterior cerebral arteries are patent proximally. Atherosclerotic irregularity of both vessels. Most notably, there are sites of up to severe stenosis is in the right PCA P2 segment, new from the prior CTA of 08/26/2022. Venous sinuses: Within the limitations of contrast timing, no convincing thrombus. Anatomic variants: Scribe as described. Review of the MIP  images confirms the above findings No emergent large vessel occlusion is identified. This finding and the presence of  an apparent severe stenosis within the right PCA P2 segment communicated to Dr. Wilford Corner at 1:21 pm on 09/11/2022 by text page via the [facility] messaging system. IMPRESSION: CTA neck: 1. The common carotid and internal carotid arteries are patent within the neck without hemodynamically significant stenosis. Atherosclerotic plaque bilaterally, as described. 2. The right vertebral artery is non-dominant and developmentally diminutive, but patent throughout the neck. 3. The left vertebral artery is patent within the neck without stenosis. Mild non-stenotic plaque at the origin of this vessel. 4. 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 5. Aortic Atherosclerosis (ICD10-I70.0). CTA head: 1. No intracranial large vessel occlusion is identified. 2. Intracranial atherosclerotic disease as described. Most notably, there are apparent sites of up to severe stenosis within the right PCA P2 segment, new from the prior CTA of 08/26/2022. Electronically Signed   By: Jackey Loge D.O.   On: 09/11/2022 13:24   CT HEAD CODE STROKE WO CONTRAST`  Result Date: 09/11/2022 CLINICAL DATA:  Code stroke. 72 year old female with weakness. 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 EXAM: CT HEAD WITHOUT CONTRAST TECHNIQUE: Contiguous axial images were obtained from the base of the skull through the vertex without intravenous contrast. RADIATION DOSE REDUCTION: This exam was performed according to the departmental dose-optimization program which includes automated exposure control, adjustment of the mA and/or kV according to patient size and/or use of iterative reconstruction technique. COMPARISON:  Head CT 09/01/2022 and brain MRI 08/26/2022. FINDINGS: Brain: Unresolved cytotoxic edema in the right frontal and parietal lobes but appears  similar to abnormal diffusion on 08/26/2022 (series 2, image 26). Hypodensity in the left cerebellum is less apparent. No hemorrhagic transformation. No midline shift, mass effect, or evidence of intracranial mass lesion. No ventriculomegaly. No new areas of cortically based infarct identified. Vascular: No suspicious intracranial vascular hyperdensity. Calcified atherosclerosis at the skull base. Skull: No acute osseous abnormality identified. Sinuses/Orbits: Stable paranasal sinus mucoperiosteal thickening. Tympanic cavities and mastoids remain well aerated. Other: No acute orbit or scalp soft tissue finding. ASPECTS [facility] Stroke Program Early CT Score) Total score (0-10 with 10 being normal): 10; nonacute cytotoxic edema suspected in the right MCA territory. IMPRESSION: 1. Unresolved cytotoxic edema in the Right MCA territory since the MRI on 08/26/2022. Other infarcts at that time less apparent by CT. 2. No new cortically based infarct or acute intracranial hemorrhage identified. 3. These results were communicated to Dr. Wilford Corner at 12:40 pm on 09/11/2022 by text page via the [facility] messaging system. Electronically Signed   By: Odessa Fleming M.D.   On: 09/11/2022 12:41    Vitals:   09/12/22 0527 09/12/22 0702 09/12/22 0754 09/12/22 0818  BP: (!) 148/67   (!) 140/61  Pulse: 76   76  Resp: 20   13  Temp: 97.6 F (36.4 C)   97.6 F (36.4 C)  TempSrc: Oral   Oral  SpO2: 94%  100% 100%  Weight:  78 kg    Height:       PHYSICAL EXAM General: Drowsy Caucasian female, well-nourished, well-developed, in no acute distress Psych:  Mood and affect appropriate for situation CV: Regular rate and rhythm on monitor Respiratory:  Regular, unlabored respirations on room air GI: Dressing present on abdomen, drain intact.  NEURO:  Mental Status: Patient wakes to voice and greets examiner at the bedside.  She is able to state her name.  She incorrectly states that her age is 12.  With further  orientation questions,  patient states "I do not know".  Patient is able to identify her son as he walks in the room.  Decreased attention, limited patient cooperation throughout exam. Speech/Language: speech is without dysarthria or aphasia.  Patient does not participate in naming or repeating phrases.  Cranial Nerves:  II: PERRL.  Briefly fixates on examiner. III, IV, VI: Incomplete eye movement leftward. V: Sensation is intact to light touch and symmetrical to face.  VII: Face is symmetrical resting and with grimace VIII: Hearing intact to voice. IX, X: Phonation is normal.  XI: Grossly midline XII: tongue is midline without fasciculations. Motor: Flaccid left upper extremity, right upper extremity moves without gravity.  Bilateral lower extremities withdrawal to noxious stimuli with minimal left-sided weakness.  Patient will wiggle toes to command. Tone: is normal and bulk is normal Sensation- Intact to light touch bilaterally.  Patient states "ouch" with light touch throughout Coordination: Patient does not perform Gait- deferred for patient's safety  ASSESSMENT/PLAN  Recrudescence of previous stroke symptoms versus new stroke Etiology: Pending further evaluation (MRI brain pending), patient with multiple stroke risk factors including hypercoagulable state, recent PE, malignancy, multiple recent strokes CT head 7/26: Unresolved cytotoxic edema in the right MCA territory since the MRI on 08/26/2022.  Other infarcts at that time less apparent by CT.  No new cortically based infarct or acute intracranial hemorrhage identified. CTA head & neck 7/26: No intracranial LVO.  Intracranial atherosclerotic disease.  Most notably, there are apparent sites of up to severe stenosis within the right PCA P2 segment, new from the prior CTA of 08/26/2022. MRI pending 2D Echo 7/3: LVEF 40-45% Patient declined TEE per previous recommendations LDL 58 HgbA1c 5.4 VTE prophylaxis -patient is on Eliquis No antithrombotic prior to  admission, now on Eliquis (apixaban) daily  Therapy recommendations:  SNF Disposition: Pending  Hx of Stroke/TIA Stroke 08/26/22: b/l anterior and posterior infarcts, pattern more concerning for hypoperfusion in the setting of severe anemia and septic shock with hypotension.  DDx including hypercoagulable state with suspected malignancy Stroke 08/20/22 - Incidental punctate infarcts at right parietal lobe, left frontal centrum semi ovale, and right cerebellum. Etiology likely small vessel disease versus embolic from malignancy CT Head 08/24/22: Questionable area of loss of gray-white differentiation in the high right parietal lobe concerning for area of acute infarction. Atrophy, chronic small vessel disease.  CTA head & neck 08/26/22: No emergent arterial finding. 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 Short segment of right V2 hypoenhancement without discrete underlying stenosis or dissection flap. No flow limiting stenosis in the anterior circulation.  MRI 08/26/22: Acute and late acute infarcts in multiple vascular territories, consistent with a central embolic source. No acute hemorrhage or significant mass effect.  Carotid Doppler 08/21/22: Right subclavian artery was stenotic.   Stroke 08/26/22: Patient with left hemiplegia, flaccid LUE, left-sided sensory deficit  Acute PE 08/28/22 Previously on Heparin gtt On Eliquis since 09/09/22  Essential hypertension Septic shock during hospitalization due to colonic perforation Home meds:  lasix, lisinopril Unstable Avoid hypotension Required levophed during admission BP goal normotensive  Hyperlipidemia Home meds:  lovastatin LDL 94, goal < 70 Patient placed on atorvastatin 20 mg inpatient Continue statin at discharge  Dysphagia Patient has post-stroke dysphagia, SLP consulted    Diet   DIET SOFT Room service appropriate? Yes; Fluid consistency: Thin   Advance diet as tolerated  Other Stroke Risk  Factors Advanced Age >/= 31  Congestive heart failure  Other Active Problems Chronic HFrEF  Adenocarcinoma Small bowel primary Palliative on board Hydronephrosis 2/2 abdominal mass Urology following Acute on chronic anemia of renal disease, ABLA S/p 6 units of RBCs since admission, last 7/25 Hgb 7.9  Hospital day # 25  Lanae Boast, AGACNP-BC [facility] Pager: [phone]  STROKE MD NOTE :  I have personally obtained history,examined this patient, reviewed notes, independently viewed imaging studies, participated in medical decision making and plan of care.ROS completed by me personally and pertinent positives fully documented  I have made any additions or clarifications directly to the above note. Agree with note above.  Patient with recent stroke with left hemiparesis who had sudden onset of neurological worsening yesterday and CT scan did not show an acute abnormality.  MRI scan is pending.  This may be recrudescence of recent deficit versus new stroke.  Long discussion with patient and son at the bedside and answered questions.   Greater than 50% time during this 35-minute visit was spent in counseling and coordination of care about her recent stroke as well as neurological worsening and discussion about evaluation plan and answering questions  Delia Heady, MD Medical Director [facility] Pager: [phone] 09/12/2022 4:19 PM   To contact Stroke Continuity provider, please refer to [email or web address]. After hours, contact General Neurology

## 2022-09-12 NOTE — Progress Notes (Signed)
[facility] PROGRESS NOTE  Debra Sharp (DOB: 10-25-1950) FUX:[id number] PCP: Clayborn Heron, MD  Brief Narrative: Debra Sharp is a 72 y.o. female 72 year old female with past medical history of hypertension, hyperlipidemia, systolic CHF, thrombocytopenia, COPD, asthma who initially presented with weakness, abdominal pain. Workup showed anemia concerning for upper GI bleed. CT abdomen/pelvis showed colonic distention, possible obstruction. GI consulted, plan was for EGD but she could not tolerate prep. Also found to have right-sided hydronephrosis, incidental acute stroke on CT/MRI. Repeat CT abdomen/pelvis on 7/6 showed new intraperitoneal free air with presumed perforation of the colon. Surgery consulted and she underwent emergent exploratory laparotomy, right hemicolectomy, ileostomy with fistula, biopsy of pelvic mass. Patient was left intubated and was transferred to ICU for pain management. 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 Palliative care consulted. General surgery following. Currently on tube feeding. Hospital course also remarkable for finding of acute PE now on heparin drip. Now extubated and transferred to [facility] service on 7/12. Plan was for TEE which has been canceled because patient declined. Palliative care, urology ,surgery ,Neurology were following. Currently titrating tube feeds. Surgery following. Patient underwent new percutaneous drain in the right lower quadrant.    Subjective: No changes, she keeps repeating numbers (her brother's phone number). No one at bedside this AM, returned to discuss with son this PM. He says she's been this way since overnight into Thursday when code stroke was called.  Objective: BP (!) 151/60 (BP Location: Left Arm)   Pulse 83   Temp 97.7 F (36.5 C) (Oral)   Resp 20   Ht 5' 5.98" (1.676 m)   Wt 78 kg   SpO2 100%   BMI 27.77 kg/m   Gen: Ill-appearing elderly female Pulm: Clear, nonlabored  CV: RRR, no MRG, no  significant pitting edema GI: Soft, NT, ND, +BS  Neuro: Alert, follows commands, answers some questions with unclear accuracy. LUE > LLE hemiparesis. Ext: Warm, no deformities. Note pt reports tenderness to palpation of any area of the body without palpable or visible abnormalities.  Skin: No new rashes, lesions or ulcers on visualized skin    Assessment & Plan: Septic shock secondary to acute peritonitis: Colonic perforation due to obstructing pelvic mass:  Status post exploratory laparotomy with right hemicolectomy, ileostomy creation, mucus fistual formation, biopsy of pelvic mass, and intraoperative of vasculature using indocyanine green.   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   Currently on broad spectrum anti-infectives with micafungin, Flagyl, cefepime.  culture NGTD.   On tube feeds, will continue for now until she's able to maintain adequate PO intake by mouth. RD c/s for tube feeds.  General surgery following, appreciate recs.   IR had been consulted for drainage of intraabdominal fluid collections, but they thought intraabdominal fluid collections are small and appeared c/w post op blood Repeat CT scan being considered by surgery if worsening fever, leukocytosis, etc Leukocytosis persist.  CT 7/13 with small volume fluid within abdomen and pelvis with several collections demonstrating rim enhancement suggestive of abscesses, new, near complete hypoattenuation of splenic parenchyma with subcapsular low density fluid along peripheral margin of spleen, multiple mildly dilated smal bowel loops with numerous air fluid levels (post op ileus), severe R hydroureteronephrosis, new mild left hydronephrosis.  Small bilateral effusions.  Large complex mass occupying majority of low right pelvis.  Patient underwent new percutaneous drain in the RLQ, ID consulted to decide about antibiotics regimen at discharge. - CT abd/pelvis repeated today, given contrast since she was  given it with head anyway. Results pending.   Newly Diagnosed Terminal Ileum Adenocarcinoma with Peritoneal Metastasis:  Culprit for colonic perforation.  Initially concern for gynecological malignancy with elevated CA125, CA 19-19 but pathology showed small bowel primary.  Appreciate Dr. Latanya Maudlin recommendations.  Possible chemo after she recovers in 1-2 months.  ? Gyn follow up for debulking surgery of pelvic mass.  Overall prognosis is poor.  Recommending indefinite anticoagulation. Heparin is being switched to DOAC. Palliative care consulted, patient wants to have full scope of care.   Chronic systolic CHF:  Echo on 08/18/2020 showed EF of 40 to 45%, global hypokinesis.  Previous echo on March 2024 showed EF of 30 to 35%.  We recommend follow-up with cardiology as an outpatient. She has bilateral LE edema - follow BNP 117 She appears euvolemic.    Acute metabolic encephalopathy:  > Resolved. Likely from sepsis, delirium, CVAs.   -repeat CT brain neg for acute findings. She is much alert, following commands.    Acute CVA: Imaging on admission with incidental punctate infarcts in R parietal, L frontal, R cerebellar. MRI done on 7/10 for left hemiparesis once extubated with acute and late acute infarcts in multiple vascular territories consistent with central embolic source. MRI brain pending 7/27, though CTA head/neck showed new P2 stenosis. - Considerations of etiology were embolic (not ruled out as pt declined TEE, does have hypercoagulability due to malignancy) and hypoperfusion in setting of severe anemia and septic shock with hypotension at that time.  - CTA 7/26 as part of code stroke does show new severe right P2 PCA stenosis. Not interventional candidate.  - Continue atorvastatin 20 mg.  - Continue eliquis (ok'ed per neurology).  - MRI brain pending.   Hyperkalemia, hyperglycemia: Spurious labs this AM due to being drawn near IVF. Confirmed to be erroneous. Can stop CBG checked.    Acute PE, splenic infarct: CT angiogram showed acute PE.  No evidence of right heart strain.   - Continue eliquis.   AKI / Hyperkalemia/ Hypophosphatemia: Currently kidney function starting to worsen. Started IVF, will have cortrak started, start nutrition. Phosphorus being supplemented.   Hydronephrosis secondary to abdominal mass: Follow-up CT showed persistent severe right hydroureteronephrosis.  IR did  PCNL placement on 7/15 (pulled overnight 7/15) - Urology saw pt 7/16--and still flushes easily. Some blood in output though hgb stable.   - Mild left hydro will need to be followed outpatient.    Acute on chronic anemia of renal disease /Acute blood loss anemia secondary to surgery/chronic GI bleed due to small bowel adenocarcinoma:  - Received 6u total RBCs this admission, last 7/25.  - Continue PPI, monitor CBC again in AM.   Hypertension: Stable   Hyperlipidemia:  - Continue statin   Hyperglycemia: A1c 5.4%. No need for CBG checks.   Left upper extremity edema: Negative venous U/S, suspect due to hemiplegia.  - Elevate extremity.    Thrombocytosis: Reactive.   Goals of care: Multiple comorbidities.  Palliative care were following.   - Remains full code/full scope of care.   Tyrone Nine, MD [facility] [email or web address] 09/12/2022, 4:46 PM

## 2022-09-12 NOTE — Progress Notes (Signed)
Pt upon assessment this morning is AAOx2, she has yelled numbers all day and screaming "ow", I medicated her appropriately. MRI is still waiting to be done I called 2x today requesting it to be done "she's on the list". MRI still is not done, Neuro and attending were notified of this. Mentation/irritation seems to have worsened throughout the day. Pt refused to take most of her medications today, this evening after giving her the most important ones she threw them up.   Balinda Quails, RN 09/12/2022 6:52 PM

## 2022-09-12 NOTE — Plan of Care (Signed)
  Problem: Health Behavior/Discharge Planning: Goal: Ability to manage health-related needs will improve Outcome: Not Progressing   Problem: Clinical Measurements: Goal: Ability to maintain clinical measurements within normal limits will improve Outcome: Not Progressing Goal: Will remain free from infection Outcome: Not Progressing Goal: Diagnostic test results will improve Outcome: Not Progressing

## 2022-09-13 ENCOUNTER — Inpatient Hospital Stay (HOSPITAL_COMMUNITY): Payer: Medicare Other

## 2022-09-13 DIAGNOSIS — I6300 Cerebral infarction due to thrombosis of unspecified precerebral artery: Secondary | ICD-10-CM

## 2022-09-13 DIAGNOSIS — K922 Gastrointestinal hemorrhage, unspecified: Secondary | ICD-10-CM | POA: Diagnosis not present

## 2022-09-13 LAB — COMPREHENSIVE METABOLIC PANEL WITH GFR
ALT: 13 U/L (ref 0–44)
AST: 14 U/L — ABNORMAL LOW (ref 15–41)
Albumin: 1.8 g/dL — ABNORMAL LOW (ref 3.5–5.0)
Alkaline Phosphatase: 57 U/L (ref 38–126)
Anion gap: 8 (ref 5–15)
BUN: 56 mg/dL — ABNORMAL HIGH (ref 8–23)
CO2: 15 mmol/L — ABNORMAL LOW (ref 22–32)
Calcium: 10.2 mg/dL (ref 8.9–10.3)
Chloride: 115 mmol/L — ABNORMAL HIGH (ref 98–111)
Creatinine, Ser: 1.95 mg/dL — ABNORMAL HIGH (ref 0.44–1.00)
GFR, Estimated: 27 mL/min — ABNORMAL LOW (ref >60)
Glucose, Bld: 119 mg/dL — ABNORMAL HIGH (ref 70–99)
Potassium: 3.4 mmol/L — ABNORMAL LOW (ref 3.5–5.1)
Sodium: 138 mmol/L (ref 135–145)
Total Bilirubin: 0.6 mg/dL (ref 0.3–1.2)
Total Protein: 5.1 g/dL — ABNORMAL LOW (ref 6.5–8.1)

## 2022-09-13 LAB — URINALYSIS, ROUTINE W REFLEX MICROSCOPIC
Bilirubin Urine: NEGATIVE
Glucose, UA: NEGATIVE mg/dL
Ketones, ur: NEGATIVE mg/dL
Nitrite: NEGATIVE
Protein, ur: 100 mg/dL — AB
Specific Gravity, Urine: 1.02 (ref 1.005–1.030)
WBC, UA: >50 WBC/hpf (ref 0–5)
pH: 5 (ref 5.0–8.0)

## 2022-09-13 LAB — CBC
HCT: 26.3 % — ABNORMAL LOW (ref 36.0–46.0)
Hemoglobin: 7.8 g/dL — ABNORMAL LOW (ref 12.0–15.0)
MCH: 25.1 pg — ABNORMAL LOW (ref 26.0–34.0)
MCHC: 29.7 g/dL — ABNORMAL LOW (ref 30.0–36.0)
MCV: 84.6 fL (ref 80.0–100.0)
Platelets: 973 10*3/uL (ref 150–400)
RBC: 3.11 MIL/uL — ABNORMAL LOW (ref 3.87–5.11)
RDW: 26.2 % — ABNORMAL HIGH (ref 11.5–15.5)
WBC: 14.7 10*3/uL — ABNORMAL HIGH (ref 4.0–10.5)
nRBC: 0 % (ref 0.0–0.2)

## 2022-09-13 MED ORDER — SODIUM CHLORIDE 0.9 % IV SOLN
1.0000 g | Freq: Two times a day (BID) | INTRAVENOUS | Status: DC
  Administered 2022-09-13 – 2022-09-16 (×7): 1 g via INTRAVENOUS
  Filled 2022-09-13 (×7): qty 20

## 2022-09-13 NOTE — Progress Notes (Signed)
[facility] PROGRESS NOTE  Debra Sharp (DOB: 05-26-1950) [id number] PCP: Clayborn Heron, MD  Brief Narrative: Debra Sharp is a 72 y.o. female 72 year old female with past medical history of hypertension, hyperlipidemia, systolic CHF, thrombocytopenia, COPD, asthma who initially presented with weakness, abdominal pain. Workup showed anemia concerning for upper GI bleed. CT abdomen/pelvis showed colonic distention, possible obstruction. GI consulted, plan was for EGD but she could not tolerate prep. Also found to have right-sided hydronephrosis, incidental acute stroke on CT/MRI. Repeat CT abdomen/pelvis on 7/6 showed new intraperitoneal free air with presumed perforation of the colon. Surgery consulted and she underwent emergent exploratory laparotomy, right hemicolectomy, ileostomy with fistula, biopsy of pelvic mass. Patient was left intubated and was transferred to ICU for pain management. 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 Palliative care consulted. General surgery following. Currently on tube feeding. Hospital course also remarkable for finding of acute PE now on heparin drip. Now extubated and transferred to [facility] service on 7/12. Plan was for TEE which has been canceled because patient declined. Palliative care, urology ,surgery ,Neurology were following. Currently titrating tube feeds. Surgery following. Patient underwent new percutaneous drain in the right lower quadrant.    Subjective: Patient still repeating phone numbers, but will answer directed questions. Not following commands. Denies pain or shortness of breath.  Objective: BP (!) 147/62 (BP Location: Left Arm)   Pulse 74   Temp 98.2 F (36.8 C) (Axillary)   Resp 15   Ht 5' 5.98" (1.676 m)   Wt 78 kg   SpO2 100%   BMI 27.77 kg/m   Gen: No distress, laying on right side will mostly just call out numbers but will answer questions Pulm: Clear, diminished at bases, but no wheezes or crackles.    CV: RRR, no MRG and no edema GI: Soft, NT, ND, +BS. Midline wound dressing c/d/I. R PCN with [place] urine in bag, no clots or pus. Milky output from JP drains. Neuro: Alert, not cooperative, Left arm > leg hemiparesis is stable, no new weakness on limited exam. Ext: Warm, no deformities, dry Skin: No new rashes, lesions or ulcers on visualized skin   Assessment & Plan: Septic shock secondary to acute peritonitis: Colonic perforation due to obstructing pelvic mass:  Status post exploratory laparotomy with right hemicolectomy, ileostomy creation, mucus fistual formation, biopsy of pelvic mass, and intraoperative of vasculature using indocyanine green.   - Continuing cefepime, flagyl. Completed course of micafungin. - Note concern for cefepime neurotoxicity, will DC and replace pending ID recommendations. Update: Considering change cefepime/flagyl to pip/tazo. Note low risk intolerance to PCN in allergy list, defer to ID.  - No longer taking adequate po, so will plan to insert cortrak if pt will tolerate. D/w son at bedside. Then RD consult. - Following general surgery recommendations and IR recommendations for drains.  - Repeat CT 7/26 (see report, findings overall showing decreased bowel distention, decompressed/decreased/stable fluid around drains, unchanged complex pelvic mass, decreased R hydronephrosis, stable splenic infarct, bilateral pleural effusions).    Newly Diagnosed Terminal Ileum Adenocarcinoma with Peritoneal Metastasis: Culprit for colonic perforation.  Initially concern for gynecological malignancy with elevated CA125, CA 19-19 but pathology showed small bowel primary.  - Appreciate Dr. Latanya Maudlin recommendations: Possible chemo after she recovers in 1-2 months. Consider gyn follow up for debulking surgery of pelvic mass.   - Overall prognosis is poor, palliative consulted, pt desired full scope of care.   Chronic systolic CHF: LVEF 30 to 35%.  - Appears without overload and  is not  taking po, started judicious IVF and will continue monitoring volume status closely. Note pleural effusions on CT, though exam is reassuring, no hypoxia or dyspnea at this time. - Monitor I/O, weights.    Acute metabolic encephalopathy:  Beginning to worsen again since 7/26.  - Neurology following, MRI pending - Support renal function with IVF and work up as below - Continue treating infection as above - No meningismus at this time.  - In the absence of other definite etiology, I'm reaching out to ID to ask if we can DC cefepime and monitor. (Putative sequence could be that the patient had code stroke > contrast with CTA head and neck > mild AKI decreasing cefepime clearance > encephalopathy).   Acute CVA: Imaging on admission with incidental punctate infarcts in R parietal, L frontal, R cerebellar. MRI done on 7/10 for left hemiparesis once extubated with acute and late acute infarcts in multiple vascular territories consistent with central embolic source. MRI brain pending 7/27, though CTA head/neck showed new P2 stenosis. - Considerations of etiology were embolic (not ruled out as pt declined TEE, does have hypercoagulability due to malignancy) and hypoperfusion in setting of severe anemia and septic shock with hypotension at that time.  - CTA 7/26 as part of code stroke does show new severe right P2 PCA stenosis. Not interventional candidate.  - Continue atorvastatin 20 mg.  - Continue eliquis (ok'ed per neurology).  - MRI brain still pending.   Acute PE, splenic infarct: CT angiogram showed acute PE.  No evidence of right heart strain.   - Continue eliquis.   AKI: Currently kidney function starting to worsen. Started IVF, will have cortrak started, start nutrition. Phosphorus being supplemented. - Renal U/S, repeat UA w/micro. - Got contrast a couple days ago, will avoid further nephrotoxins as able.  - With NAGMA, started bicarb gtt, will continue monitoring.    Hydronephrosis  secondary to abdominal mass: Follow-up CT showed persistent severe right hydroureteronephrosis.  IR did PCN placement on 7/15 (pulled overnight 7/15) - Urology saw pt 7/16--and still flushes easily. Some blood in output though hgb stable. Cr rising, checking U/S as above, though collecting systems both abnormal on recent CT. Still with gross hematuria from R PCN, d/w urology, Dr. Ronne Binning who will evaluate. Recommends flushing tube w/100cc NS, then send aspirate for urine culture (messaged RN and ordered).  - Mild left hydro will need to be followed outpatient.    Acute on chronic anemia of renal disease /Acute blood loss anemia secondary to surgery/chronic GI bleed due to small bowel adenocarcinoma:  - Received 6u total RBCs this admission, last 7/25.  - Continue PPI, monitor CBC again in AM.   Hypertension: Stable   Hyperlipidemia:  - Continue statin   Hyperglycemia: A1c 5.4%. No need for CBG checks.   Left upper extremity edema: Negative venous U/S, suspect due to hemiplegia.  - Elevate extremity.    Thrombocytosis: Reactive.   Goals of care: Multiple comorbidities.  Palliative care were following.   - Remains full code/full scope of care.   Tyrone Nine, MD [facility] [email or web address] 09/13/2022, 10:43 AM

## 2022-09-13 NOTE — Progress Notes (Signed)
STROKE TEAM PROGRESS NOTE   BRIEF HPI Debra Sharp is a 72 y.o. female with history of HTN, HLD, HFrEF, thrombocytopenia, COPD, asthma, and depression presenting initially with generalized weakness, abdominal pain, and anemia concerning for UGIB and IDA with complications during admission including colon perforation, emergent ex lap, right hemicolectomy, ileostomy with fistula, biopsy of pelvic mass 08/22/2022, incidental punctate infarcts at the right parietal lobe, left frontal centrum semiovale, and right cerebellum due to small vessel disease versus embolic from malignancy 7/4, and new onset of left-sided weakness following extubation 08/24/2022.  MRI revealed acute and late acute infarcts in multiple vascular territories concerning for hypoperfusion in the setting of severe anemia and septic shock with hypotension.  Stroke team evaluated patient and signed off with recommendations of Plavix per GI team.  SIGNIFICANT HOSPITAL EVENTS 7/2: Admission to hospital 7/4: Incidental punctate infarcts right parietal, left frontal centrum semiovale, and right cerebellar 7/6: Emergent ex lap due to colon perforation, right hemicolectomy, ileostomy with fistula, biopsy of pelvic mass.  Severe anemia, septic shock. 7/8: Extubation with new onset of left-sided weakness.   7/10: MRI with acute and late acute infarcts in multiple vascular territories concerning for hypoperfusion injury. 7/12: IV heparin initiated for acute PE treatment, neurology signed off  7/26: Code stroke activated for left-sided flaccid weakness, left-sided neglect.  CT angio obtained revealing a new severe stenosis in the right P2 PCA.  Patient is not a candidate for interventions due to recent stroke.  INTERIM HISTORY/SUBJECTIVE No family at the bedside today., neurologic exam is stable.  Patient remains disoriented and confused and keeps on repeating her brother's phone number multiple times.  She continues to have left hemiparesis arm  much greater than leg.  She also has asterixis MRI read pending.  Pharmacy recommends changing antibiotics. OBJECTIVE  CBC    Component Value Date/Time   WBC 14.7 (H) 09/13/2022 0327   RBC 3.11 (L) 09/13/2022 0327   HGB 7.8 (L) 09/13/2022 0327   HCT 26.3 (L) 09/13/2022 0327   PLT 973 (HH) 09/13/2022 0327   MCV 84.6 09/13/2022 0327   MCH 25.1 (L) 09/13/2022 0327   MCHC 29.7 (L) 09/13/2022 0327   RDW 26.2 (H) 09/13/2022 0327   LYMPHSABS 0.7 04/24/2022 1011   MONOABS 0.4 04/24/2022 1011   EOSABS 0.0 04/24/2022 1011   BASOSABS 0.3 (H) 04/24/2022 1011   BMET    Component Value Date/Time   NA 138 09/13/2022 0327   K 3.4 (L) 09/13/2022 0327   CL 115 (H) 09/13/2022 0327   CO2 15 (L) 09/13/2022 0327   GLUCOSE 119 (H) 09/13/2022 0327   BUN 56 (H) 09/13/2022 0327   CREATININE 1.95 (H) 09/13/2022 0327   CALCIUM 10.2 09/13/2022 0327   GFRNONAA 27 (L) 09/13/2022 0327   IMAGING past 24 hours US RENAL  Result Date: 09/13/2022 CLINICAL DATA:  [id number] AKI (acute kidney injury) [facility]) [id number] Obstructive uropathy [id number] EXAM: RENAL / URINARY TRACT ULTRASOUND COMPLETE COMPARISON:  August 30, 2022, September 11, 2022 FINDINGS: Right Kidney: Renal measurements: 10.0 x 4.8 x 4.2 cm = volume: 103 mL. Echogenicity is mildly increased. No mass or hydronephrosis visualized. Resolution of previously visualized hydronephrosis from prior July 14 ultrasound. Left Kidney: Renal measurements: 11.5 x 6.4 x 5.4 cm = volume: 208 mL. Echogenicity within normal limits. No mass or hydronephrosis visualized. Bladder: Bladder is completely decompressed. Other: Small volume free fluid. IMPRESSION: 1. No hydronephrosis. 2. Mildly increased echogenicity of the RIGHT kidney which can be seen in the  setting of medical renal disease. 3. Small volume free fluid. Electronically Signed   By: Meda Klinefelter M.D.   On: 09/13/2022 10:24    Vitals:   09/12/22 2010 09/12/22 2358 09/13/22 0758 09/13/22 0834  BP: (!) 146/54 (!)  142/50  (!) 147/62  Pulse: 77 87  74  Resp: 20 15  15   Temp: 97.8 F (36.6 C) 98.4 F (36.9 C)  98.2 F (36.8 C)  TempSrc: Oral Oral  Axillary  SpO2: 100% 100% 100% 100%  Weight:      Height:       PHYSICAL EXAM General: Elderly Caucasian female, well-nourished, well-developed, in no acute distress Psych:  Mood and affect appropriate for situation CV: Regular rate and rhythm on monitor Respiratory:  Regular, unlabored respirations on room air GI: Dressing present on abdomen, drain intact.  NEURO:  Mental Status: Patient wakes to voice and greets examiner at the bedside.  She is able to state her name.  She is disoriented.  She keeps on repeating a phone number multiple times..  Decreased attention, limited patient cooperation throughout exam. Speech/Language: speech is without dysarthria or aphasia.  Patient does not participate in naming or repeating phrases.  Cranial Nerves:  II: PERRL.  Briefly fixates on examiner. III, IV, VI: Incomplete eye movement leftward. V: Sensation is intact to light touch and symmetrical to face.  VII: Face is symmetrical resting and with grimace VIII: Hearing intact to voice. IX, X: Phonation is normal.  XI: Grossly midline XII: tongue is midline without fasciculations. Motor: Flaccid left upper extremity, right upper extremity moves without gravity.  Bilateral lower extremities withdrawal to noxious stimuli with minimal left-sided weakness.  Patient will wiggle toes to command.Marland Kitchenasterixis present in the right upper extremity Tone: is normal and bulk is normal Sensation- Intact to light touch bilaterally.  Patient states "ouch" with light touch throughout Coordination: Patient does not perform Gait- deferred for patient's safety  ASSESSMENT/PLAN  Recrudescence of previous stroke symptoms versus new stroke Etiology: Pending further evaluation (MRI brain pending), patient with multiple stroke risk factors including hypercoagulable state, recent PE,  malignancy, multiple recent strokes CT head 7/26: Unresolved cytotoxic edema in the right MCA territory since the MRI on 08/26/2022.  Other infarcts at that time less apparent by CT.  No new cortically based infarct or acute intracranial hemorrhage identified. CTA head & neck 7/26: No intracranial LVO.  Intracranial atherosclerotic disease.  Most notably, there are apparent sites of up to severe stenosis within the right PCA P2 segment, new from the prior CTA of 08/26/2022. MRI pending 2D Echo 7/3: LVEF 40-45% Patient declined TEE per previous recommendations LDL 58 HgbA1c 5.4 VTE prophylaxis -patient is on Eliquis No antithrombotic prior to admission, now on Eliquis (apixaban) daily  Therapy recommendations:  SNF Disposition: Pending  Hx of Stroke/TIA Stroke 08/26/22: b/l anterior and posterior infarcts, pattern more concerning for hypoperfusion in the setting of severe anemia and septic shock with hypotension.  DDx including hypercoagulable state with suspected malignancy Stroke 08/20/22 - Incidental punctate infarcts at right parietal lobe, left frontal centrum semi ovale, and right cerebellum. Etiology likely small vessel disease versus embolic from malignancy CT Head 08/24/22: Questionable area of loss of gray-white differentiation in the high right parietal lobe concerning for area of acute infarction. Atrophy, chronic small vessel disease.  CTA head & neck 08/26/22: No emergent arterial finding. 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 Short segment of right V2 hypoenhancement without discrete underlying stenosis or dissection flap. No  flow limiting stenosis in the anterior circulation.  MRI 08/26/22: Acute and late acute infarcts in multiple vascular territories, consistent with a central embolic source. No acute hemorrhage or significant mass effect.  Carotid Doppler 08/21/22: Right subclavian artery was stenotic.   Stroke 08/26/22: Patient with left hemiplegia, flaccid  LUE, left-sided sensory deficit  Acute PE 08/28/22 Previously on Heparin gtt On Eliquis since 09/09/22  Essential hypertension Septic shock during hospitalization due to colonic perforation Home meds:  lasix, lisinopril Unstable Avoid hypotension Required levophed during admission BP goal normotensive  Hyperlipidemia Home meds:  lovastatin LDL 94, goal < 70 Patient placed on atorvastatin 20 mg inpatient Continue statin at discharge  Dysphagia Patient has post-stroke dysphagia, SLP consulted    Diet   DIET SOFT Room service appropriate? Yes; Fluid consistency: Thin   Advance diet as tolerated  Other Stroke Risk Factors Advanced Age >/= 40  Congestive heart failure  Other Active Problems Chronic HFrEF  Adenocarcinoma Small bowel primary Palliative on board Hydronephrosis 2/2 abdominal mass Urology following Acute on chronic anemia of renal disease, ABLA S/p 6 units of RBCs since admission, last 7/25 Hgb 7.9  Hospital day # 26   Patient with recent stroke with left hemiparesis who had sudden onset of neurological worsening yesterday and CT scan did not show an acute abnormality.  MRI scan is pending.  This may be recrudescence of recent deficit versus new stroke.  Her neurological exam also suggest some encephalopathy which may be multifactorial due to combination of infection and antibiotics..  Recommend changing antibiotics to piptazo and stop cefepime and Flagyl as per pharmacy recommendations.Marland Kitchen  o greater than 50% time during this 35-minute visit was spent in counseling and coordination of care about her recent stroke as well as neurological worsening and discussion about evaluation plan and answering questions.  Delia Heady, MD Medical Director [facility] Pager: [phone] 09/13/2022 11:41 AM   To contact Stroke Continuity provider, please refer to [email or web address]. After hours, contact General Neurology

## 2022-09-13 NOTE — Progress Notes (Signed)
Pharmacy Antibiotic Note  Debra Sharp is a 72 y.o. female admitted on 08/18/2022 with  pseudomonal intraabdominal polymicrobial abscess . Dr Jarvis Newcomer and team is concerned about patient's confusion for no obvious organic cause and would like to switch from cefepime/flagyl to alternative. Pharmacy has been consulted for meropenem dosing.   Patient remains afebrile. WBC is 14.7, increased from 12.3 yesterday (7/27). Meropenem dosing based on CrCl of 28.3 mL/min.  Plan: Meropenam IV 1,000 mg q12h  Monitor renal function, CBC, and signs/symptoms of an infection  Height: 5' 5.98" (167.6 cm) Weight: 78 kg (171 lb 15.3 oz) IBW/kg (Calculated) : 59.26  Temp (24hrs), Avg:98 F (36.7 C), Min:97.7 F (36.5 C), Max:98.4 F (36.9 C)  Recent Labs  Lab 09/08/22 0656 09/09/22 0340 09/10/22 0340 09/12/22 0455 09/12/22 1244 09/13/22 0327  WBC 15.8* 15.7* 13.1* 12.3*  --  14.7*  CREATININE 1.37* 1.49* 1.62* 1.78* 1.87* 1.95*    Estimated Creatinine Clearance: 28.3 mL/min (A) (by C-G formula based on SCr of 1.95 mg/dL (H)).    Allergies  Allergen Reactions   Iodinated Contrast Media Shortness Of Breath and Itching    Resolved with benadryl and solu-mederol. Consider premedication prior to future studies.   Aspirin Other (See Comments)    Caused an asthma attack   Ibuprofen Swelling and Other (See Comments)    Angioedema and Asthma exacerbation   Sulfonamide Derivatives Swelling and Other (See Comments)    Face swelled SEVERELY   Penicillins Rash    Antimicrobials this admission: Cefepime 7/6 > 7/28 Flagyl 7/6 > 7/28 Meropenem 7/28 >>  Microbiology results: 7/20 fluid>> few pseudomonas (panS) & moderate streptococcus anginosis (PanS except erythro) 7/15 Ucx: NgF 7/6 BCx - negativeF 7/6 MRSA PCR - negative  Thank you for allowing pharmacy to be a part of this patient's care.  Roslyn Smiling, PharmD PGY1 Pharmacy Resident 09/13/2022 11:29 AM

## 2022-09-13 NOTE — Progress Notes (Addendum)
Pt appeared confused ( oriented x1), refused taking PO meds. MD notified.   Lawson Radar, RN

## 2022-09-13 NOTE — Plan of Care (Signed)
  Problem: Health Behavior/Discharge Planning: Goal: Ability to manage health-related needs will improve Outcome: Not Progressing   Problem: Clinical Measurements: Goal: Ability to maintain clinical measurements within normal limits will improve Outcome: Not Progressing Goal: Will remain free from infection Outcome: Not Progressing Goal: Diagnostic test results will improve Outcome: Not Progressing   Problem: Activity: Goal: Risk for activity intolerance will decrease Outcome: Not Progressing   Problem: Nutrition: Goal: Adequate nutrition will be maintained Outcome: Not Progressing

## 2022-09-13 NOTE — Progress Notes (Addendum)
Id brief note   Dr Jarvis Newcomer of primary team is concern patient is getting confused for no obvious organic cause, outside of cefepime/flagyl    Pseudomonas is susceptible to piptazo mic 4.     -stop cefepime/flagyl -start piptazo   --------- Pharmacy team report chart hx pcn allergy  -we can try meropenem and reassess full allergy hx to see if piptazo reasonable for use

## 2022-09-13 NOTE — Progress Notes (Signed)
Spoke to MRI tech for pending MRI. MRI tech stated that he would call primary RN a head of time if pt going to  get MRI today.   Lawson Radar, RN

## 2022-09-14 ENCOUNTER — Inpatient Hospital Stay (HOSPITAL_COMMUNITY): Payer: Medicare Other

## 2022-09-14 ENCOUNTER — Ambulatory Visit: Payer: Medicare Other | Admitting: Hematology

## 2022-09-14 ENCOUNTER — Other Ambulatory Visit: Payer: Medicare Other

## 2022-09-14 DIAGNOSIS — K651 Peritoneal abscess: Secondary | ICD-10-CM | POA: Diagnosis not present

## 2022-09-14 DIAGNOSIS — K922 Gastrointestinal hemorrhage, unspecified: Secondary | ICD-10-CM | POA: Diagnosis not present

## 2022-09-14 LAB — GLUCOSE, CAPILLARY
Glucose-Capillary: 111 mg/dL — ABNORMAL HIGH (ref 70–99)
Glucose-Capillary: 143 mg/dL — ABNORMAL HIGH (ref 70–99)

## 2022-09-14 LAB — PHOSPHORUS: Phosphorus: 3.8 mg/dL (ref 2.5–4.6)

## 2022-09-14 LAB — MAGNESIUM: Magnesium: 1.9 mg/dL (ref 1.7–2.4)

## 2022-09-14 MED ORDER — LOPERAMIDE HCL 2 MG PO CAPS: 2.0000 mg | ORAL_CAPSULE | Freq: Three times a day (TID) | ORAL | Status: DC

## 2022-09-14 MED ORDER — VITAMIN C 500 MG PO TABS
500.0000 mg | ORAL_TABLET | Freq: Every day | ORAL | Status: DC
  Administered 2022-09-15 – 2022-09-16 (×2): 500 mg
  Filled 2022-09-14 (×2): qty 1

## 2022-09-14 MED ORDER — DIPHENOXYLATE-ATROPINE 2.5-0.025 MG PO TABS
1.0000 | ORAL_TABLET | Freq: Two times a day (BID) | ORAL | Status: DC
  Administered 2022-09-14 – 2022-09-16 (×4): 1
  Filled 2022-09-14 (×4): qty 1

## 2022-09-14 MED ORDER — PROSOURCE TF20 ENFIT COMPATIBL EN LIQD
60.0000 mL | Freq: Every day | ENTERAL | Status: DC
  Administered 2022-09-14 – 2022-09-16 (×3): 60 mL
  Filled 2022-09-14 (×3): qty 60

## 2022-09-14 MED ORDER — ACETAMINOPHEN 160 MG/5ML PO SOLN
650.0000 mg | Freq: Four times a day (QID) | ORAL | Status: DC
  Administered 2022-09-14 – 2022-09-16 (×9): 650 mg
  Filled 2022-09-14 (×9): qty 20.3

## 2022-09-14 MED ORDER — FERROUS SULFATE 300 (60 FE) MG/5ML PO SOLN
300.0000 mg | Freq: Two times a day (BID) | ORAL | Status: DC
  Administered 2022-09-14 – 2022-09-16 (×5): 300 mg
  Filled 2022-09-14 (×6): qty 5

## 2022-09-14 MED ORDER — ONDANSETRON HCL 4 MG PO TABS: 4.0000 mg | ORAL_TABLET | Freq: Four times a day (QID) | ORAL | Status: DC | PRN

## 2022-09-14 MED ORDER — LOPERAMIDE HCL 2 MG PO CAPS
2.0000 mg | ORAL_CAPSULE | Freq: Two times a day (BID) | ORAL | Status: DC
  Administered 2022-09-14: 2 mg via ORAL
  Filled 2022-09-14 (×5): qty 1

## 2022-09-14 MED ORDER — ATORVASTATIN CALCIUM 10 MG PO TABS
20.0000 mg | ORAL_TABLET | Freq: Every day | ORAL | Status: DC
  Administered 2022-09-14 – 2022-09-16 (×3): 20 mg
  Filled 2022-09-14 (×3): qty 2

## 2022-09-14 MED ORDER — SERTRALINE HCL 50 MG PO TABS
150.0000 mg | ORAL_TABLET | Freq: Every day | ORAL | Status: DC
  Administered 2022-09-14 – 2022-09-16 (×3): 150 mg
  Filled 2022-09-14 (×3): qty 1

## 2022-09-14 MED ORDER — ENSURE ENLIVE PO LIQD
237.0000 mL | Freq: Three times a day (TID) | ORAL | Status: DC
  Administered 2022-09-14 – 2022-09-16 (×6): 237 mL

## 2022-09-14 MED ORDER — ADULT MULTIVITAMIN W/MINERALS CH
1.0000 | ORAL_TABLET | Freq: Every day | ORAL | Status: DC
  Administered 2022-09-14 – 2022-09-16 (×3): 1
  Filled 2022-09-14 (×3): qty 1

## 2022-09-14 MED ORDER — MELATONIN 5 MG PO TABS: 10.0000 mg | ORAL_TABLET | Freq: Every evening | ORAL | Status: DC | PRN

## 2022-09-14 MED ORDER — THIAMINE MONONITRATE 100 MG PO TABS
100.0000 mg | ORAL_TABLET | Freq: Every day | ORAL | Status: DC
  Administered 2022-09-14 – 2022-09-16 (×3): 100 mg
  Filled 2022-09-14 (×3): qty 1

## 2022-09-14 MED ORDER — GLUCERNA 1.5 CAL PO LIQD
1000.0000 mL | ORAL | Status: DC
  Filled 2022-09-14: qty 1000

## 2022-09-14 MED ORDER — ZINC SULFATE 220 (50 ZN) MG PO CAPS
220.0000 mg | ORAL_CAPSULE | Freq: Every day | ORAL | Status: DC
  Administered 2022-09-15 – 2022-09-16 (×2): 220 mg
  Filled 2022-09-14 (×2): qty 1

## 2022-09-14 MED ORDER — CALCIUM POLYCARBOPHIL 625 MG PO TABS
625.0000 mg | ORAL_TABLET | Freq: Two times a day (BID) | ORAL | Status: DC
  Administered 2022-09-15 – 2022-09-16 (×4): 625 mg
  Filled 2022-09-14 (×7): qty 1

## 2022-09-14 MED ORDER — APIXABAN 5 MG PO TABS
5.0000 mg | ORAL_TABLET | Freq: Two times a day (BID) | ORAL | Status: DC
  Administered 2022-09-14 – 2022-09-16 (×4): 5 mg
  Filled 2022-09-14 (×4): qty 1

## 2022-09-14 MED ORDER — LOPERAMIDE HCL 1 MG/7.5ML PO SUSP
2.0000 mg | Freq: Two times a day (BID) | ORAL | Status: DC
  Administered 2022-09-14 – 2022-09-16 (×5): 2 mg
  Filled 2022-09-14 (×6): qty 15

## 2022-09-14 MED ORDER — SODIUM CHLORIDE 0.45 % IV SOLN
INTRAVENOUS | Status: DC
  Filled 2022-09-14 (×4): qty 75

## 2022-09-14 MED ORDER — OXYCODONE HCL 5 MG PO TABS: 5.0000 mg | ORAL_TABLET | ORAL | Status: DC | PRN

## 2022-09-14 MED ORDER — ONDANSETRON HCL 4 MG/2ML IJ SOLN: 4.0000 mg | Freq: Four times a day (QID) | INTRAMUSCULAR | Status: DC | PRN

## 2022-09-14 MED ORDER — JEVITY 1.5 CAL/FIBER PO LIQD
1000.0000 mL | ORAL | Status: DC
  Administered 2022-09-14 – 2022-09-15 (×2): 1000 mL
  Filled 2022-09-14 (×4): qty 1000

## 2022-09-14 NOTE — Progress Notes (Addendum)
Brief Nutrition Note  Consult received for enteral/tube feeding initiation and management.  Adult Enteral Nutrition Protocol initiated. Full assessment to follow.  RD briefly visited patient who's son was at bedside. RD explained EN to patient's son who reports being familiar with his mother tube feeding from a previous encounter. Patient was in bed repeating numbers.   Her mental status has been declining.   Initiate TF's  Jevity 1.5 @55  (1320 ml/day)  Prosource TF20 daily  Start at 20 ml/hr and advance by 10 ml q8 hrs until goal rate is reached Provides 2060 kcal, 104 g protein, 1003 ml fluid   Monitor electrolytes (K+, mag, phos) for signs of refeeding- MD to replete when necessary   MD to add FWF if desired   NG tube in place with tip located in distal stomach per xray imaging.   Admitting Dx: GI bleed [K92.2]  Body mass index is 27.77 kg/m. Pt meets criteria for overweight based on current BMI.  Labs:  Recent Labs  Lab 09/08/22 0656 09/09/22 0340 09/10/22 0340 09/12/22 1244 09/13/22 0327 09/14/22 0300  NA 136 135   < > 140 138 143  K 4.1 4.2   < > 3.9 3.4* 3.7  CL 115* 113*   < > 119* 115* 116*  CO2 16* 14*   < > 14* 15* 16*  BUN 47* 49*   < > 55* 56* 58*  CREATININE 1.37* 1.49*   < > 1.87* 1.95* 1.98*  CALCIUM 9.2 10.0   < > 10.3 10.2 10.0  MG 1.7 1.7  --   --   --   --   PHOS 3.1 4.2  --   --   --   --   GLUCOSE 107* 91   < > 109* 119* 86   < > = values in this interval not displayed.     Leodis Rains, RDN, LDN  Clinical Nutrition

## 2022-09-14 NOTE — Progress Notes (Signed)
[facility] PROGRESS NOTE  Debra Sharp (DOB: 1950-08-26) BMW:[id number] PCP: Clayborn Heron, MD  Brief Narrative: Debra Sharp is a 72 y.o. female 72 year old female with past medical history of hypertension, hyperlipidemia, systolic CHF, thrombocytopenia, COPD, asthma who initially presented with weakness, abdominal pain. Workup showed anemia concerning for upper GI bleed. CT abdomen/pelvis showed colonic distention, possible obstruction. GI consulted, plan was for EGD but she could not tolerate prep. Also found to have right-sided hydronephrosis, incidental acute stroke on CT/MRI. Repeat CT abdomen/pelvis on 7/6 showed new intraperitoneal free air with presumed perforation of the colon. Surgery consulted and she underwent emergent exploratory laparotomy, right hemicolectomy, ileostomy with fistula, biopsy of pelvic mass. Patient was left intubated and was transferred to ICU for pain management. 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 Palliative care consulted. General surgery following. Currently on tube feeding. Hospital course also remarkable for finding of acute PE now on heparin drip. Now extubated and transferred to [facility] service on 7/12. Plan was for TEE which has been canceled because patient declined. Palliative care, urology ,surgery ,Neurology were following. Currently titrating tube feeds. Surgery following. Patient underwent new percutaneous drain in the right lower quadrant.    Subjective: Repeating phone numbers stable from prior days, confused, answers some simple questions. Son Debra Sharp updated telephonically today.  Objective: BP (!) 154/70 (BP Location: Left Arm)   Pulse 71   Temp 97.8 F (36.6 C) (Axillary)   Resp 20   Ht 5' 5.98" (1.676 m)   Wt 78 kg   SpO2 100%   BMI 27.77 kg/m   Gen: No distress, laying on right side will mostly just call out numbers but will answer questions Pulm: Clear, diminished at bases, but no wheezes or crackles.   CV:  RRR, no MRG and no edema GI: Soft, NT, ND, +BS. Midline wound dressing c/d/I. R PCN with auburn urine in bag, no clots or pus. Milky output from JP drains. Neuro: Alert, not cooperative, Left arm > leg hemiparesis is stable, no new weakness on limited exam. Ext: Warm, no deformities, dry Skin: No new rashes, lesions or ulcers on visualized skin   Assessment & Plan: Septic shock secondary to acute peritonitis: Colonic perforation due to obstructing pelvic mass:  Status post exploratory laparotomy with right hemicolectomy, ileostomy creation, mucus fistual formation, biopsy of pelvic mass, and intraoperative of vasculature using indocyanine green.   - cefepime/flagyl converted to penem by ID on 7/28 due to concern cefepime effects on cognition, kidney function. Completed course of micafungin. - No longer taking adequate po, so have placed cortrak, will consult RD - Following general surgery recommendations and IR recommendations for drains.  - Repeat CT 7/26 (see report, findings overall showing decreased bowel distention, decompressed/decreased/stable fluid around drains, unchanged complex pelvic mass, decreased R hydronephrosis, stable splenic infarct, bilateral pleural effusions).    Newly Diagnosed Terminal Ileum Adenocarcinoma with Peritoneal Metastasis: Culprit for colonic perforation.  Initially concern for gynecological malignancy with elevated CA125, CA 19-19 but pathology showed small bowel primary.  - Appreciate Dr. Latanya Maudlin recommendations: Possible chemo after she recovers in 1-2 months. Consider gyn follow up for debulking surgery of pelvic mass.   - Overall prognosis is poor, palliative consulted, pt desired full scope of care.   Chronic systolic CHF: LVEF 30 to 35%.  - appears euvolemic - Monitor I/O, weights.    Acute metabolic encephalopathy:  Beginning to worsen again since 7/26.  - Neurology following, MRI with new acute/subactue infarct in the posterior corona radiata -  Support renal function with IVF and work up as below - Continue treating infection as above - No meningismus at this time.  - abx change as above   Acute CVA: Imaging on admission with incidental punctate infarcts in R parietal, L frontal, R cerebellar. MRI done on 7/10 for left hemiparesis once extubated with acute and late acute infarcts in multiple vascular territories consistent with central embolic source. MRI brain pending 7/27, though CTA head/neck showed new P2 stenosis. Repeat MRI on 7/28 shows punctate infarct posterior corona radiata - Considerations of etiology were embolic (not ruled out as pt declined TEE, does have hypercoagulability due to malignancy) and hypoperfusion in setting of severe anemia and septic shock with hypotension at that time.  - CTA 7/26 as part of code stroke does show new severe right P2 PCA stenosis. Not interventional candidate.  - Continue atorvastatin 20 mg.  - Continue eliquis (ok'ed per neurology).  - neurology following   Acute PE, splenic infarct: CT angiogram showed acute PE.  No evidence of right heart strain.   - Continue eliquis.   AKI: Currently kidney function starting to worsen. Renal u/s no obstruction.  - start bicarb gtt - will need nephrology consult if kidney function continues to worsen    Hydronephrosis secondary to abdominal mass: Follow-up CT showed persistent severe right hydroureteronephrosis.  IR did PCN placement on 7/15 (pulled overnight 7/15) - Urology saw pt 7/16--and still flushes easily. Some blood in output though hgb stable. Cr rising, checked U/S as above, no hydro,  - will need to touch base w/ urology prior to discharge for d/c f/u planning   Acute on chronic anemia of renal disease /Acute blood loss anemia secondary to surgery/chronic GI bleed due to small bowel adenocarcinoma:  - Received 6u total RBCs this admission, last 7/25.  - Continue PPI, monitor CBC again in AM.   Hypertension: Stable   Hyperlipidemia:   - Continue statin   Hyperglycemia: A1c 5.4%. No need for CBG checks.   Left upper extremity edema: Negative venous U/S, suspect due to hemiplegia.  - Elevate extremity.    Thrombocytosis: Reactive.   Goals of care: Multiple comorbidities.  Palliative care was following.   - Today I shared with son that prognosis is very poor and there is a very low chance of making a meaningful recovery. I shared that a transition to comfort care would be a reasonable approach. He said he is not yet ready to "throw in the towel" and wishes to continue our current full code / full scope  of care   Silvano Bilis, MD [facility] [email or web address] 09/14/2022, 10:26 AM

## 2022-09-14 NOTE — Progress Notes (Signed)
[facility]  Date of Admission:  08/18/2022      Lines: 7/16-c rue picc  Right pcn 7/16-c Right and left jp drain  Colostomy  Abx: 7/28-c meropenem  7/06-28 Cefepime 7/06-28 Flagyl   Micafungin stopped                                                       Assessment: 72 yo female recently dx'ed small bowel cancer, s/p small bowel obstruction/perforation exlap, complicated by intraabd abscesses  IR drain 7/20 x2 - cx in progress but growing strep anginosus and pseudomonas Right hydronephrosis s/p pcn 7/15   Imaging multiple abscesses Abd wound open for 2nd closure   Oncology and gen surgery involved  Of note, she had stroke recently early course of this admission noted on brain mri 7/10 (acute/late infarcts multiple vascular territories concerning for hypoperfusion injury); also complicated by acute pe needing anticoagulation this admission  ------------ 7/29 assessment Patient developed confusion around 7/26 and no obvious cause. Switched cefepime to meropenem on 7/28. Has pcn allergy distant past and only know that it is hives  Tolerating meropenem Improving mentation but still confused  Repeat abd ct 7/26 no change much in fluid collection  Neurology following for confusion/stroke like sx; repeat brain mri 7/28 no obvious new lesions   Plan: Continue meropenem Awaiting disposition Plan 4 weeks from 7/29 for abx F/u ir for drain management Discussed with primary team   Principal Problem:   GI bleed Active Problems:   Symptomatic anemia   Thrombocytosis   Iron deficiency anemia   Abnormal CT of the abdomen   Colon distention   History of cardioembolic cerebrovascular accident (CVA)   Silent cerebral infarction (HCC)   Bowel perforation (HCC)   Ventilator dependent (HCC)   Pelvic mass   Change in bowel habits   Abnormal CT scan, gastrointestinal tract   Arterial hypotension   Septic shock (HCC)   Fecal occult  blood test positive   Uterine leiomyoma   Hydroureteronephrosis   Sepsis with acute hypoxic respiratory failure and septic shock (HCC)   Encephalopathy acute   Perforation bowel (HCC)   Malnutrition of moderate degree   Cerebrovascular accident (CVA) due to embolism of precerebral artery (HCC)   Acute metabolic encephalopathy   Delirium   Adenocarcinoma of small bowel (HCC)   Cerebrovascular accident (CVA) due to thrombosis of precerebral artery (HCC)   Allergies  Allergen Reactions   Iodinated Contrast Media Shortness Of Breath and Itching    Resolved with benadryl and solu-mederol. Consider premedication prior to future studies.   Aspirin Other (See Comments)    Caused an asthma attack   Ibuprofen Swelling and Other (See Comments)    Angioedema and Asthma exacerbation   Sulfonamide Derivatives Swelling and Other (See Comments)    Face swelled SEVERELY   Penicillins Rash    Scheduled Meds:  acetaminophen (TYLENOL) oral liquid 160 mg/5 mL  650 mg Per Tube Q6H   apixaban  5 mg Per Tube BID   arformoterol  15 mcg Nebulization BID   [START ON 09/15/2022] ascorbic acid  500 mg Per Tube Daily   atorvastatin  20 mg Per Tube Daily   budesonide (PULMICORT) nebulizer solution  0.5 mg Nebulization BID   Chlorhexidine Gluconate Cloth  6 each Topical Daily   diphenoxylate-atropine  1 tablet Per Tube BID   feeding supplement  237 mL Per Tube TID BM   feeding supplement (PROSource TF20)  60 mL Per Tube Daily   ferrous sulfate  300 mg Per Tube BID   loperamide  2 mg Oral BID   loperamide HCl  2 mg Per Tube BID   melatonin  3 mg Oral QHS   multivitamin with minerals  1 tablet Per Tube Daily   pantoprazole (PROTONIX) IV  40 mg Intravenous Q12H   polycarbophil  625 mg Per Tube BID   revefenacin  175 mcg Nebulization Daily   sertraline  150 mg Per Tube Daily   sodium chloride flush  10-40 mL Intracatheter Q12H   sodium chloride flush  5 mL Intracatheter Q8H   thiamine  100 mg Per Tube  Daily   [START ON 09/15/2022] zinc sulfate  220 mg Per Tube Daily   Continuous Infusions:  sodium chloride 250 mL (09/04/22 2000)   feeding supplement (JEVITY 1.5 CAL/FIBER) 1,000 mL (09/14/22 1525)   meropenem (MERREM) IV Stopped (09/14/22 1108)   promethazine (PHENERGAN) injection (IM or IVPB)     sodium bicarbonate 75 mEq in sodium chloride 0.45 % 1,075 mL infusion 75 mL/hr at 09/14/22 1600   PRN Meds:.albuterol, haloperidol lactate, hydrALAZINE, lip balm, melatonin, morphine injection, naLOXone (NARCAN)  injection, ondansetron **OR** ondansetron (ZOFRAN) IV, mouth rinse, oxyCODONE, promethazine (PHENERGAN) injection (IM or IVPB), sodium chloride flush   SUBJECTIVE: Confused on cefepime switched to meropnem. Improved per her son but still confused 7/26 repeat abd pelv ct not much change in fluid collection Afebrile Colostomy functioning   Review of Systems: ROS All other ROS was negative, except mentioned above     OBJECTIVE: Vitals:   09/13/22 2314 09/14/22 0353 09/14/22 0925 09/14/22 1433  BP: (!) 147/55 (!) 152/68 (!) 154/70 (!) 147/59  Pulse: 79 70 71 74  Resp: 20 15 20 18   Temp: 98.1 F (36.7 C) 98 F (36.7 C) 97.8 F (36.6 C) 97.6 F (36.4 C)  TempSrc: Oral Axillary Axillary Oral  SpO2: 100% 94% 100% 99%  Weight:      Height:       Body mass index is 27.77 kg/m.  Physical Exam General/constitutional: no distress, eyes closed, mumbling same words over and over again, somewhat cooperative letting me examine her HEENT: Normocephalic Neck supple CV: rrr no mrg Lungs: clear to auscultation, normal respiratory effort Abd: Soft; jp drain with thick yellow fluid; dressing clean/dry Ext: trace edema bilateral upper and lower extremities Skin: No Rash Neuro: confused; generalized weakness; limited exam MSK: no peripheral joint swelling/tenderness/warmth  Central line presence: rue picc site no erythema or purulence   Lab Results Lab Results  Component  Value Date   WBC 13.8 (H) 09/14/2022   HGB 8.0 (L) 09/14/2022   HCT 26.9 (L) 09/14/2022   MCV 86.5 09/14/2022   PLT 951 (HH) 09/14/2022    Lab Results  Component Value Date   CREATININE 1.98 (H) 09/14/2022   BUN 58 (H) 09/14/2022   NA 143 09/14/2022   K 3.7 09/14/2022   CL 116 (H) 09/14/2022   CO2 16 (L) 09/14/2022    Lab Results  Component Value Date   ALT 13 09/14/2022   AST 13 (L) 09/14/2022   ALKPHOS 59 09/14/2022   BILITOT 0.5 09/14/2022      Microbiology: Recent Results (from the past 240 hour(s))  Aerobic/Anaerobic Culture w Gram Stain (surgical/deep wound)  Status: None   Collection Time: 09/05/22 12:14 PM   Specimen: Abscess  Result Value Ref Range Status   Specimen Description ABSCESS  Final   Special Requests NONE  Final   Gram Stain   Final    ABUNDANT WBC PRESENT, PREDOMINANTLY PMN ABUNDANT GRAM POSITIVE RODS FEW GRAM NEGATIVE RODS RARE GRAM POSITIVE COCCI    Culture   Final    FEW PSEUDOMONAS AERUGINOSA MODERATE STREPTOCOCCUS ANGINOSIS ABUNDANT BACTEROIDES OVATUS ABUNDANT BACTEROIDES VULGATUS BETA LACTAMASE POSITIVE Performed at [facility], [address] [place], [place] [address]    Report Status 09/08/2022 FINAL  Final   Organism ID, Bacteria PSEUDOMONAS AERUGINOSA  Final   Organism ID, Bacteria STREPTOCOCCUS ANGINOSIS  Final      Susceptibility   Pseudomonas aeruginosa - MIC*    CEFTAZIDIME 2 SENSITIVE Sensitive     CIPROFLOXACIN <=0.25 SENSITIVE Sensitive     GENTAMICIN <=1 SENSITIVE Sensitive     IMIPENEM 1 SENSITIVE Sensitive     PIP/TAZO <=4 SENSITIVE Sensitive     CEFEPIME 2 SENSITIVE Sensitive     * FEW PSEUDOMONAS AERUGINOSA   Streptococcus anginosis - MIC*    PENICILLIN <=0.06 SENSITIVE Sensitive     CEFTRIAXONE <=0.12 SENSITIVE Sensitive     ERYTHROMYCIN 4 RESISTANT Resistant     LEVOFLOXACIN 0.5 SENSITIVE Sensitive     VANCOMYCIN 0.5 SENSITIVE Sensitive     * MODERATE STREPTOCOCCUS ANGINOSIS  Urine Culture (for  pregnant, neutropenic or urologic patients or patients with an indwelling urinary catheter)     Status: None   Collection Time: 09/13/22 11:06 AM   Specimen: Urine, Catheterized  Result Value Ref Range Status   Specimen Description URINE, CATHETERIZED  Final   Special Requests NONE  Final   Culture   Final    NO GROWTH Performed at [facility], [address], [place], [place] [address]    Report Status 09/14/2022 FINAL  Final     Serology:   Imaging: If present, new imagings (plain films, ct scans, and mri) have been personally visualized and interpreted; radiology reports have been reviewed. Decision making incorporated into the Impression / Recommendations.  7/28 mri brain 1. Punctate focus of restricted diffusion in the posterior corona radiata white matter compatible with a small acute/subacute nonhemorrhagic infarct. 2. Extensive T2 and FLAIR hyperintensities associated with the infarcts that were acute July 10th. 3. Mild periventricular and scattered subcortical T2 hyperintensities otherwise are mildly advanced for age. This likely reflects the sequela of chronic microvascular ischemia. 4. Chronic left sphenoid sinus disease.      7/26 abd pelv ct FINDINGS: Lower chest: Small bilateral pleural effusions with compressive atelectasis at the lung bases.   Hepatobiliary: Stable appearance of the liver and gallbladder. No significant gallbladder distension. Again noted is a small low-density structure in the right hepatic lobe on image 39/3 that is too small to definitively characterize. No significant biliary dilatation.   Pancreas: Stable appearance of the pancreas without significant inflammatory changes.   Spleen: Again noted is marked low-density throughout the spleen with residual areas of contrast enhancement. Findings are suggestive for a large splenic infarct. Trace fluid around the spleen. History of splenic vein thrombus but this area is poorly  characterized on this examination.   Adrenals/Urinary Tract: Normal adrenal glands. Right percutaneous nephrostomy tube is well positioned in the renal pelvis. Persistent dilatation of the right renal collecting system but this has decreased since the previous CT. Mild dilatation of the right ureter. Small  low-density in the posterolateral aspect of the right kidney is again noted. Stable appearance of the left kidney with mild dilatation of the left renal pelvis. No significant dilatation of left ureter.   Stomach/Bowel: Again noted is a large heterogeneous mass in the central and right side of the pelvis which is abutting or involving the sigmoid colon. Colon is decompressed with a mucous fistula. History of right hemicolectomy. Again noted is an ileostomy. Decreased bowel distension compared to the most recent comparison examination. No evidence for bowel obstruction. Normal appearance of the stomach.   Vascular/Lymphatic: Atherosclerotic calcifications in the abdominal aorta without aneurysm. No significant lymph node enlargement in the abdomen or pelvis.   Reproductive: Again noted is an enlarged uterus with a large calcified fibroid. Difficult to evaluate the adnexal structures but no significant change from the previous examination.   Other: Again noted is a large heterogeneous low-density mass involving the central and right side of the pelvis. This structure roughly measures 10.9 x 6.5 x 10.6 cm. Again noted is a small amount of gas within this structure. This structure abuts the sigmoid colon and the posterior right side of the uterus. There is a surgical drain that extends between the mass and the uterus. The drain extends up into the right paracolic gutter region. Again noted is a small rim enhancing fluid collection around the superior aspect of the uterus and near the drain entrance site in the anterior pelvis. This small fluid collection has a maximum thickness of  1.4 cm and has minimally changed from the prior examination. Again noted is a small rim enhancing fluid collection in the posterior pelvis adjacent to the rectum. This pelvic fluid collection measures 1.9 x 1.8 cm image 75/3 and previously measured 2.6 x 1.7 cm. A pigtail drain has been placed in the right lateral abdominal fluid collection. This right lateral abdominal fluid collection is markedly smaller in size. There is a small residual collection along the superior aspect of the drain that measures roughly 1.5 cm on image 47/3. Small fluid collection in the right lower quadrant on image 65/3 is decreased in size. Trace fluid in the left lower quadrant has minimally changed. Trace free fluid or perirectal edema in the pelvis.   Musculoskeletal: Grade 2 anterolisthesis of L4 on L5 with disc space narrowing. No suspicious osseous lesion. Diffuse subcutaneous edema.   IMPRESSION: 1. Decreased bowel distension compared to the exam on 09/04/2022. 2. Right lateral abdominal fluid collection is decompressed since placement of the percutaneous drain. Small amount of residual fluid around the drain. 3. Residual small fluid collections in the lower abdomen and pelvis as described. A complex collection around the uterus and surgical drain is minimally changed. No new fluid or abscess collections in the abdomen or pelvis. 4. Again noted is a complex mass/fluid collection in the pelvis. This pelvic mass has minimally changed. 5. Bilateral pleural effusions with compressive atelectasis at the lung bases. 6. Decreased distension of the right renal collecting system. Right nephrostomy tube appears to be well positioned. 7. Stable appearance of the splenic infarct. 8. Fibroid uterus.  Raymondo Band, MD [facility] [phone] pager    09/14/2022, 5:10 PM

## 2022-09-14 NOTE — Progress Notes (Signed)
Physical Therapy Treatment Patient Details Name: Debra Sharp MRN: [id number] DOB: 10/20/1950 Today's Date: 09/14/2022   History of Present Illness 72 yo female admitted 7/2 with generalized weakness, thrombocytosis and anemia. 7/2 MRI scattered punctate ischemic infarcts right parietal, left frontal, right cerebellum. Pt with progression to AKI with septic peritonitis. 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 7/8 extubated. Lt sided weakness noted post extubation with CT demonstrating possible Rt parietal infarct. PMHx: COPD, anemia, CHF, HLD, HTN    PT Comments  Pt received in supine and agreeable to session. Pt with noticeable change in cognition this session demonstrated by repetition of numbers and decreased initiation. However, pt is still able to respond to questions and cues appropriately majority of the time. Pt continues to require total A +2 for bed mobility and up to max A to maintain sitting balance on EOB due to weakness and pain. Pt reporting pain with assist to maintain balance, however is unable to state where. Pt is able to briefly perform an anterior lean and maintain sitting balance with min guard, however demonstrates a L lateral lean requiring assist to correct. When asked if pt can correct lateral lean, pt states "I dont think so". Pt continues to benefit from PT services to progress toward functional mobility goals.    If plan is discharge home, recommend the following: Two people to help with walking and/or transfers;Two people to help with bathing/dressing/bathroom;Assistance with cooking/housework;Assistance with feeding;Direct supervision/assist for medications management;Assist for transportation;Direct supervision/assist for financial management   Can travel by private vehicle     No  Equipment Recommendations  Wheelchair (measurements PT);Hospital bed;Wheelchair cushion (measurements PT);Other  (comment)    Recommendations for Other Services       Precautions / Restrictions Precautions Precautions: Fall;Other (comment) Precaution Comments: drain, nephrostomy bag, ostomy, picc line, cortrak Restrictions Weight Bearing Restrictions: No     Mobility  Bed Mobility Overal bed mobility: Needs Assistance Bed Mobility: Supine to Sit, Sit to Supine, Rolling Rolling: Total assist   Supine to sit: Total assist, +2 for physical assistance Sit to supine: Total assist, +2 for physical assistance   General bed mobility comments: assist for all aspect of mobility    Transfers                   General transfer comment: unable        Balance Overall balance assessment: Needs assistance Sitting-balance support: Single extremity supported, Feet supported, No upper extremity supported Sitting balance-Leahy Scale: Poor Sitting balance - Comments: mod-max A to maintain sitting balance with pt able to briefly maintain with min guard and cues. Pt recognizing she is leaning to the L, however states she is unable to correct it. Postural control: Left lateral lean                                  Cognition Arousal/Alertness: Awake/alert Behavior During Therapy: Flat affect Overall Cognitive Status: Impaired/Different from baseline                                 General Comments: Pt repeating numbers throughout session and stops to respond to cues, questions, and pain intermittently. Pt able to answer questions with increased cues, but demonstrates decreased initiation of movement.        Exercises  General Comments        Pertinent Vitals/Pain Pain Assessment Pain Assessment: Faces Faces Pain Scale: Hurts little more Pain Location: generalized with mobility Pain Descriptors / Indicators: Discomfort, Grimacing Pain Intervention(s): Limited activity within patient's tolerance, Monitored during session, Repositioned     PT Goals  (current goals can now be found in the care plan section) Acute Rehab PT Goals PT Goal Formulation: Patient unable to participate in goal setting Time For Goal Achievement: 09/11/22 Potential to Achieve Goals: Fair Progress towards PT goals: Not progressing toward goals - comment (Limited by cognition and fatigue)    Frequency    Min 2X/week      PT Plan Current plan remains appropriate       AM-PAC PT "6 Clicks" Mobility   Outcome Measure  Help needed turning from your back to your side while in a flat bed without using bedrails?: Total Help needed moving from lying on your back to sitting on the side of a flat bed without using bedrails?: Total Help needed moving to and from a bed to a chair (including a wheelchair)?: Total Help needed standing up from a chair using your arms (e.g., wheelchair or bedside chair)?: Total Help needed to walk in hospital room?: Total Help needed climbing 3-5 steps with a railing? : Total 6 Click Score: 6    End of Session   Activity Tolerance: Patient limited by fatigue Patient left: in bed;with call bell/phone within reach;with family/visitor present Nurse Communication: Mobility status PT Visit Diagnosis: Other abnormalities of gait and mobility (R26.89);Muscle weakness (generalized) (M62.81);Other symptoms and signs involving the nervous system (R29.898);Hemiplegia and hemiparesis     Time: 1610-9604 PT Time Calculation (min) (ACUTE ONLY): 21 min  Charges:    $Therapeutic Activity: 8-22 mins PT General Charges $$ ACUTE PT VISIT: 1 Visit                     Johny Shock, PTA Acute Rehabilitation Services Secure Chat Preferred  [phone]    Johny Shock 09/14/2022, 1:46 PM

## 2022-09-14 NOTE — Procedures (Signed)
Cortrak  Person Inserting Tube:  Kendell Bane C, RD Tube Type:  Cortrak - 43 inches Tube Size:  10 Tube Location:  Left nare Secured by: Bridle Technique Used to Measure Tube Placement:  Marking at nare/corner of mouth Cortrak Secured At:  66 cm Procedure Comments:  Cortrak Tube Team Note:  Consult received to place a Cortrak feeding tube.   X-ray is required, abdominal x-ray has been ordered by the Cortrak team. Please confirm tube placement before using the Cortrak tube.   If the tube becomes dislodged please keep the tube and contact the Cortrak team at [email or web address] for replacement.  If after hours and replacement cannot be delayed, place a NG tube and confirm placement with an abdominal x-ray.    Greig Castilla, RD, LDN Clinical Dietitian RD pager # available in AMION  After hours/weekend pager # available in [facility]

## 2022-09-14 NOTE — Progress Notes (Deleted)
Pharmacy Antibiotic Note  Debra Sharp is a 72 y.o. female admitted on 08/18/2022 with  pseudomonal intraabdominal polymicrobial abscess . Dr Jarvis Newcomer and team is concerned about patient's confusion for no obvious organic cause and would like to switch from cefepime/flagyl to alternative. Pharmacy was consulted on 7/28 for meropenem dosing.   Patient remains afebrile. WBC down to 13.3. Scr has increased to 1.98,  Meropenem dosing based on CrCl of 27.9 mL/min.  Plan: Change Meropenem to 2g IV q24h  Monitor renal function, CBC, and signs/symptoms of an infection  Height: 5' 5.98" (167.6 cm) Weight:  (unable to weigh pt due to bed malfunction) IBW/kg (Calculated) : 59.26  Temp (24hrs), Avg:97.9 F (36.6 C), Min:97.3 F (36.3 C), Max:98.3 F (36.8 C)  Recent Labs  Lab 09/09/22 0340 09/10/22 0340 09/12/22 0455 09/12/22 1244 09/13/22 0327 09/14/22 0300  WBC 15.7* 13.1* 12.3*  --  14.7* 13.8*  CREATININE 1.49* 1.62* 1.78* 1.87* 1.95* 1.98*    Estimated Creatinine Clearance: 27.9 mL/min (A) (by C-G formula based on SCr of 1.98 mg/dL (H)).    Allergies  Allergen Reactions   Iodinated Contrast Media Shortness Of Breath and Itching    Resolved with benadryl and solu-mederol. Consider premedication prior to future studies.   Aspirin Other (See Comments)    Caused an asthma attack   Ibuprofen Swelling and Other (See Comments)    Angioedema and Asthma exacerbation   Sulfonamide Derivatives Swelling and Other (See Comments)    Face swelled SEVERELY   Penicillins Rash    Antimicrobials this admission: Cefepime 7/6 > 7/28 Flagyl 7/6 > 7/28 Meropenem 7/28 >>  Microbiology results: 7/28 Urine Cx:  negative  7/20 fluid>> few pseudomonas (panS) & moderate streptococcus anginosis (PanS except erythro) 7/15 Ucx: NgF 7/6 BCx - negativeF 7/6 MRSA PCR - negative   Thank you for allowing pharmacy to be a part of this patient's care.  Noah Delaine, RPh Clinical Pharmacist 09/14/2022 12:15  PM

## 2022-09-14 NOTE — Consult Note (Signed)
WOC Nurse ostomy follow-up consult note; ileostomy and mucus fistula performed 08/22/2022  Stoma type/location:  RMQ ileostomy, RLQ mucus fistula  Stomal assessment/size:  1 3/4" ileostomy, red moist, slightly oval; mucus fistula 1 3/8" round, red moist; both above skin level but located in close proximity with narrow intact bridge of skin in between, making it difficult to apply 2 separate pouches.    Peristomal assessment: intact peristomal skin; patient with a few dark colored skin tags near superior portion of stoma, bleed slightly when cleansed. Output: Mod amt dark brown liquid stool in bedside drainage bag. Ostomy pouching: Medium Kathrine Cords  [place] [phone]) pouch placed to cover both ileostomy and fistula; pattern left in room. Applied piece of barrier in between stomas over exposed skin. Instructions provided for bedside nurses to perform pouch change PRN if leaking. Pt did not watch the procedure or ask questions and does not appear aware of ostomy pouch. No family present.  Pt will need total assistance with pouch application and emptying after discharge. Disposition unknown at this time. 2 sets of supplies for staff nurse's use are at the bedisde. Enrolled patient in [place] Secure Start DC program: NOT YET.  WOC team will assess weekly. Thank-you,  Cammie Mcgee MSN, RN, CWOCN, [place], CNS [phone]

## 2022-09-14 NOTE — Progress Notes (Addendum)
23 Days Post-Op   Subjective/Chief Complaint: Patient with worsening encephalopathy by report.  Not eating.  Cortrak just placed.  Patient is just repeating numbers over and over.    Objective: Vital signs in last 24 hours: Temp:  [97.3 F (36.3 C)-98.3 F (36.8 C)] 97.8 F (36.6 C) (07/29 0925) Pulse Rate:  [70-79] 71 (07/29 0925) Resp:  [11-25] 20 (07/29 0925) BP: (147-154)/(55-70) 154/70 (07/29 0925) SpO2:  [92 %-100 %] 100 % (07/29 0925) Last BM Date : 09/13/22  Intake/Output from previous day: 07/28 0701 - 07/29 0700 In: 255 [P.O.:30; IV Piggyback:200] Out: 1730 [Urine:550; Drains:30; Stool:1150] Intake/Output this shift: No intake/output data recorded.  Gen: Alert, NAD, pleasant Abd: Soft, Cortrak in place. Stoma and mucus fistula viable - ostomy appliance with bilious output.  Midline wound with some fibrinous tissue at the base but otherwise clean. L surgical JP drain stable with purulent/milky output today. IR drain output scant and appears milky as well.  Both drains with 15cc of output in last 24 hrs. GU: R PCN in place with some bloody urine  Lab Results:  Recent Labs    09/13/22 0327 09/14/22 0300  WBC 14.7* 13.8*  HGB 7.8* 8.0*  HCT 26.3* 26.9*  PLT 973* 951*   BMET Recent Labs    09/13/22 0327 09/14/22 0300  NA 138 143  K 3.4* 3.7  CL 115* 116*  CO2 15* 16*  GLUCOSE 119* 86  BUN 56* 58*  CREATININE 1.95* 1.98*  CALCIUM 10.2 10.0   PT/INR No results for input(s): "LABPROT", "INR" in the last 72 hours. ABG No results for input(s): "PHART", "HCO3" in the last 72 hours.  Invalid input(s): "PCO2", "PO2"  Studies/Results: MR BRAIN WO CONTRAST  Result Date: 09/13/2022 CLINICAL DATA:  Neuro deficit, acute, stroke suspected. New onset acute left-sided weakness 2 days ago. EXAM: MRI HEAD WITHOUT CONTRAST TECHNIQUE: Multiplanar, multiecho pulse sequences of the brain and surrounding structures were obtained without intravenous contrast. COMPARISON:   CT head without contrast 09/11/2022. MR head without contrast 09/01/2022. FINDINGS: Brain: Diffusion-weighted images demonstrate a punctate focus of restricted diffusion in the posterior corona radiata white matter. No acute or focal cortical abnormalities are present. Extensive T2 and FLAIR hyperintensities are associated with the infarcts that were acute July 10th. Mild periventricular and scattered subcortical T2 hyperintensities are present otherwise. No acute hemorrhage or mass lesion is present. The cerebellar infarcts are less profound than they were on the prior MRI. The internal auditory canals are within normal limits. Midline structures are within normal limits. Deep brain nuclei are within normal limits. The ventricles are of normal size. Vascular: Flow is present in the major intracranial arteries. Skull and upper cervical spine: The craniocervical junction is normal. Upper cervical spine is within normal limits. Marrow signal is unremarkable. Sinuses/Orbits: The left sphenoid sinus is near completely opacified. Mild mucosal thickening is present in the maxillary sinuses and residual ethmoid air cells following ethmoidectomy. A small right mastoid effusion is present. No obstruction is evident. Bilateral lens replacements are noted. Globes and orbits are otherwise unremarkable. IMPRESSION: 1. Punctate focus of restricted diffusion in the posterior corona radiata white matter compatible with a small acute/subacute nonhemorrhagic infarct. 2. Extensive T2 and FLAIR hyperintensities associated with the infarcts that were acute July 10th. 3. Mild periventricular and scattered subcortical T2 hyperintensities otherwise are mildly advanced for age. This likely reflects the sequela of chronic microvascular ischemia. 4. Chronic left sphenoid sinus disease. Electronically Signed   By: Virl Son.D.  On: 09/13/2022 17:46   US RENAL  Result Date: 09/13/2022 CLINICAL DATA:  [id number] AKI (acute kidney  injury) [facility]) [id number] Obstructive uropathy [id number] EXAM: RENAL / URINARY TRACT ULTRASOUND COMPLETE COMPARISON:  August 30, 2022, September 11, 2022 FINDINGS: Right Kidney: Renal measurements: 10.0 x 4.8 x 4.2 cm = volume: 103 mL. Echogenicity is mildly increased. No mass or hydronephrosis visualized. Resolution of previously visualized hydronephrosis from prior July 14 ultrasound. Left Kidney: Renal measurements: 11.5 x 6.4 x 5.4 cm = volume: 208 mL. Echogenicity within normal limits. No mass or hydronephrosis visualized. Bladder: Bladder is completely decompressed. Other: Small volume free fluid. IMPRESSION: 1. No hydronephrosis. 2. Mildly increased echogenicity of the RIGHT kidney which can be seen in the setting of medical renal disease. 3. Small volume free fluid. Electronically Signed   By: Meda Klinefelter M.D.   On: 09/13/2022 10:24    Anti-infectives: Anti-infectives (From admission, onward)    Start     Dose/Rate Route Frequency Ordered Stop   09/13/22 1245  meropenem (MERREM) 1 g in sodium chloride 0.9 % 100 mL IVPB        1 g 200 mL/hr over 30 Minutes Intravenous Every 12 hours 09/13/22 1146     09/06/22 1200  ceFEPIme (MAXIPIME) 2 g in sodium chloride 0.9 % 100 mL IVPB  Status:  Discontinued        2 g 200 mL/hr over 30 Minutes Intravenous Every 12 hours 09/06/22 0920 09/13/22 1057   09/04/22 2200  metroNIDAZOLE (FLAGYL) tablet 500 mg  Status:  Discontinued        500 mg Oral Every 12 hours 09/04/22 0741 09/13/22 1101   08/30/22 0830  ceFEPIme (MAXIPIME) 2 g in sodium chloride 0.9 % 100 mL IVPB  Status:  Discontinued        2 g 200 mL/hr over 30 Minutes Intravenous Every 8 hours 08/30/22 0826 09/06/22 0920   08/23/22 2100  ceFEPIme (MAXIPIME) 2 g in sodium chloride 0.9 % 100 mL IVPB  Status:  Discontinued        2 g 200 mL/hr over 30 Minutes Intravenous Every 12 hours 08/23/22 1357 08/30/22 0826   08/23/22 0400  micafungin (MYCAMINE) 100 mg in sodium chloride 0.9 % 100 mL IVPB   Status:  Discontinued        100 mg 105 mL/hr over 1 Hours Intravenous Daily 08/23/22 0242 09/08/22 1029   08/22/22 1800  metroNIDAZOLE (FLAGYL) IVPB 500 mg  Status:  Discontinued        500 mg 100 mL/hr over 60 Minutes Intravenous Every 12 hours 08/22/22 1711 09/04/22 0741   08/22/22 1800  ceFEPIme (MAXIPIME) 2 g in sodium chloride 0.9 % 100 mL IVPB  Status:  Discontinued        2 g 200 mL/hr over 30 Minutes Intravenous Every 8 hours 08/22/22 1716 08/23/22 1357   08/22/22 1724  metroNIDAZOLE (FLAGYL) 500 MG/100ML IVPB       Note to Pharmacy: [name]: cabinet override      08/22/22 1724 08/23/22 0845   08/19/22 1030  cefTRIAXone (ROCEPHIN) 1 g in sodium chloride 0.9 % 100 mL IVPB  Status:  Discontinued        1 g 200 mL/hr over 30 Minutes Intravenous Every 24 hours 08/19/22 0937 08/22/22 1715       Assessment/Plan: POD 23, s/p exploratory laparotomy, right hemicolectomy, ileostomy, mucus fistula, biopsy of pelvic mass by Dr. 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 Margins negative. 0/43 lymph nodes.  - Discussed case with attending - given mass is cancerous, the concern is that this was not resectable during her initial operation. If this progresses and causes another obstruction I think her options would be limited surgically. Dr. Mosetta Putt of Oncology has seen and felt her cancer is likely incurable and her overall prognosis is poor. They will plan to see her back in 4-6 weeks to discuss options for chemo. Palliative is following - patient wishes to proceed with full code/full scope; however, patient now with worsening encephalopathy after CVA and extended hospitalization.  Would recommend re-engaging palliative and family for GOC discussions.  She is also not eating which is another sign of her overall poor prognosis and FTT. - Ileostomy with high output.  Cont strict I/O. Cont BID fiber, BID iron, BID imodium and BID Lomotil. Monitor output, Cr, electrolytes. (This had been refused/held for some reason the last couple of days so won't adjust just yet and have this reinitiated) - WOC for stoma teaching and assistance with pouching. - BID wet to dry to midline - CT 7/14 obtained for drain appearing feculent - this showed intra-abdominal fluid collections. IR consulted for drainage but felt the intra-abdominal fluid collections are small and look like post-op blood. Repeat CT 7/19 similar. S/p IR drain 7/20 with tubry cloudy fluid.  - Cx from IR drain with pseudomonas and strep anginosis. Abx per ID. Repeat CT planned for today by IR/ID. We will follow up on this.  - Cont JP drain - no longer feculent  - IR drain and flushes per IR.  - Mobilize, PT - rec for SNF  -discussed patient with primary service at the bedside and on the ward.   FEN: Soft diet. Shakes. Cortrak just replaced as patient not eating. VTE: SCDs, Eliquis  ID: Cefepime/flagyl per ID Foley: Removed, Monitor. Urology following. S/p R PCN Dispo - As above   Per [facility] New stroke - Seen on MRI 7/10, suspected thromboembolic, defer to primary team/stroke team for further w/u and management.  Anemia - [facility] has ordered PRBC PE HTN HLD GERD COPD R hydro - s/p R PCN CT 7/14 also suggestion of splenic infarct and bilateral pleural effusions    Letha Cape, PA-C  09/14/2022 [facility]

## 2022-09-14 NOTE — Progress Notes (Signed)
Referring Physician(s): Axel Filler   Supervising Physician: Oley Balm  Patient Status:  [facility] - In-pt  Chief Complaint:  Exploratory laparotomy, right hemicolectomy, ileostomy, mucus fistula, biopsy of pelvic mass by Dr. Bedelia Person 7/6 for Colonic perforation secondary to obstructing pelvic mass  - CT finding of right hydronephrosis s/p R PCN placement by Dr. Archer Asa on 7/15 - CT finding of persistent intraabdominal fluid collection, s/p RLQ drain placement by Dr. Archer Asa on 7/20  Patient has surgical drain and ostomy bag   Subjective:  Patient laying in bed, confused but not in acute distress. Family member and RN at bedside.    Allergies: Iodinated contrast media, Aspirin, Ibuprofen, Sulfonamide derivatives, and Penicillins  Medications: Prior to Admission medications   Medication Sig Start Date End Date Taking? Authorizing Provider  cetirizine (ZYRTEC) 10 MG tablet Take 1 tablet (10 mg total) by mouth daily. Patient taking differently: Take 10 mg by mouth daily as needed for allergies or rhinitis. 10/09/19  Yes Hawks, Christy A, FNP  ferrous sulfate 325 (65 FE) MG EC tablet Take 325 mg by mouth daily.   Yes [provider]  fluticasone (FLONASE) 50 MCG/ACT nasal spray Place 2 sprays into both nostrils daily. Patient taking differently: Place 2 sprays into both nostrils at bedtime as needed for allergies or rhinitis. 10/09/19  Yes Hawks, Christy A, FNP  lisinopril (ZESTRIL) 40 MG tablet Take 40 mg by mouth in the morning.   Yes [provider]  lovastatin (MEVACOR) 20 MG tablet Take 20 mg by mouth at bedtime.   Yes [provider]  PROAIR HFA 108 (90 Base) MCG/ACT inhaler Inhale 2 puffs into the lungs every 6 (six) hours as needed for wheezing or shortness of breath.   Yes [provider]  sertraline (ZOLOFT) 100 MG tablet Take 150 mg by mouth in the morning.   Yes [provider]  SYMBICORT 160-4.5 MCG/ACT inhaler  Inhale 2 puffs into the lungs in the morning and at bedtime.   Yes [provider]     Vital Signs: BP (!) 154/70 (BP Location: Left Arm)   Pulse 71   Temp 97.8 F (36.6 C) (Axillary)   Resp 20   Ht 5' 5.98" (1.676 m)   Wt 171 lb 15.3 oz (78 kg)   SpO2 100%   BMI 27.77 kg/m   Physical Exam Vitals reviewed.  Constitutional:      General: She is not in acute distress.    Comments: Sleeping   HENT:     Head: Normocephalic.  Pulmonary:     Effort: Pulmonary effort is normal.  Abdominal:     General: Abdomen is flat.     Palpations: Abdomen is soft.     Comments: + R PCN - red colored urine in the bag.  + RLQ drain - tan, purulent fluid in the suction bulb, ~ 10 mL or less   + ostomy  + surgical drain   Skin:    General: Skin is warm and dry.  Psychiatric:        Mood and Affect: Mood normal.        Behavior: Behavior normal.     Imaging: DG Abd Portable 1V  Result Date: 09/14/2022 CLINICAL DATA:  Feeding tube placement EXAM: PORTABLE ABDOMEN - 1 VIEW COMPARISON:  CT abdomen pelvis 09/11/2022 FINDINGS: Distal tip of the feeding tube located at the expected site of the distal stomach. Right lower quadrant pigtail catheter and right nephrostomy drain are seen. No  dilated loops of bowel to indicate ileus or obstruction. Calcified pelvic mass partially visualized. Pelvic surgical drain is again noted. IMPRESSION: Tip of feeding tube located in the region of the distal stomach. Electronically Signed   By: Acquanetta Belling M.D.   On: 09/14/2022 11:42   MR BRAIN WO CONTRAST  Result Date: 09/13/2022 CLINICAL DATA:  Neuro deficit, acute, stroke suspected. New onset acute left-sided weakness 2 days ago. EXAM: MRI HEAD WITHOUT CONTRAST TECHNIQUE: Multiplanar, multiecho pulse sequences of the brain and surrounding structures were obtained without intravenous contrast. COMPARISON:  CT head without contrast 09/11/2022. MR head without contrast 09/01/2022. FINDINGS: Brain:  Diffusion-weighted images demonstrate a punctate focus of restricted diffusion in the posterior corona radiata white matter. No acute or focal cortical abnormalities are present. Extensive T2 and FLAIR hyperintensities are associated with the infarcts that were acute July 10th. Mild periventricular and scattered subcortical T2 hyperintensities are present otherwise. No acute hemorrhage or mass lesion is present. The cerebellar infarcts are less profound than they were on the prior MRI. The internal auditory canals are within normal limits. Midline structures are within normal limits. Deep brain nuclei are within normal limits. The ventricles are of normal size. Vascular: Flow is present in the major intracranial arteries. Skull and upper cervical spine: The craniocervical junction is normal. Upper cervical spine is within normal limits. Marrow signal is unremarkable. Sinuses/Orbits: The left sphenoid sinus is near completely opacified. Mild mucosal thickening is present in the maxillary sinuses and residual ethmoid air cells following ethmoidectomy. A small right mastoid effusion is present. No obstruction is evident. Bilateral lens replacements are noted. Globes and orbits are otherwise unremarkable. IMPRESSION: 1. Punctate focus of restricted diffusion in the posterior corona radiata white matter compatible with a small acute/subacute nonhemorrhagic infarct. 2. Extensive T2 and FLAIR hyperintensities associated with the infarcts that were acute July 10th. 3. Mild periventricular and scattered subcortical T2 hyperintensities otherwise are mildly advanced for age. This likely reflects the sequela of chronic microvascular ischemia. 4. Chronic left sphenoid sinus disease. Electronically Signed   By: Marin Roberts M.D.   On: 09/13/2022 17:46   US RENAL  Result Date: 09/13/2022 CLINICAL DATA:  [id number] AKI (acute kidney injury) [facility]) [id number] Obstructive uropathy [id number] EXAM: RENAL / URINARY TRACT  ULTRASOUND COMPLETE COMPARISON:  August 30, 2022, September 11, 2022 FINDINGS: Right Kidney: Renal measurements: 10.0 x 4.8 x 4.2 cm = volume: 103 mL. Echogenicity is mildly increased. No mass or hydronephrosis visualized. Resolution of previously visualized hydronephrosis from prior July 14 ultrasound. Left Kidney: Renal measurements: 11.5 x 6.4 x 5.4 cm = volume: 208 mL. Echogenicity within normal limits. No mass or hydronephrosis visualized. Bladder: Bladder is completely decompressed. Other: Small volume free fluid. IMPRESSION: 1. No hydronephrosis. 2. Mildly increased echogenicity of the RIGHT kidney which can be seen in the setting of medical renal disease. 3. Small volume free fluid. Electronically Signed   By: Meda Klinefelter M.D.   On: 09/13/2022 10:24   CT ABDOMEN PELVIS W CONTRAST  Result Date: 09/11/2022 CLINICAL DATA:  History of colonic perforation secondary to obstructive pelvic mass. Evaluate for abdominal abscess. EXAM: CT ABDOMEN AND PELVIS WITH CONTRAST TECHNIQUE: Multidetector CT imaging of the abdomen and pelvis was performed using the standard protocol following bolus administration of intravenous contrast. RADIATION DOSE REDUCTION: This exam was performed according to the departmental dose-optimization program which includes automated exposure control, adjustment of the mA and/or kV according to patient size and/or use of iterative reconstruction technique. CONTRAST:  60mL OMNIPAQUE IOHEXOL 350 MG/ML SOLN COMPARISON:  CT abdomen pelvis 09/04/2022 FINDINGS: Lower chest: Small bilateral pleural effusions with compressive atelectasis at the lung bases. Hepatobiliary: Stable appearance of the liver and gallbladder. No significant gallbladder distension. Again noted is a small low-density structure in the right hepatic lobe on image 39/3 that is too small to definitively characterize. No significant biliary dilatation. Pancreas: Stable appearance of the pancreas without significant inflammatory  changes. Spleen: Again noted is marked low-density throughout the spleen with residual areas of contrast enhancement. Findings are suggestive for a large splenic infarct. Trace fluid around the spleen. History of splenic vein thrombus but this area is poorly characterized on this examination. Adrenals/Urinary Tract: Normal adrenal glands. Right percutaneous nephrostomy tube is well positioned in the renal pelvis. Persistent dilatation of the right renal collecting system but this has decreased since the previous CT. Mild dilatation of the right ureter. Small low-density in the posterolateral aspect of the right kidney is again noted. Stable appearance of the left kidney with mild dilatation of the left renal pelvis. No significant dilatation of left ureter. Stomach/Bowel: Again noted is a large heterogeneous mass in the central and right side of the pelvis which is abutting or involving the sigmoid colon. Colon is decompressed with a mucous fistula. History of right hemicolectomy. Again noted is an ileostomy. Decreased bowel distension compared to the most recent comparison examination. No evidence for bowel obstruction. Normal appearance of the stomach. Vascular/Lymphatic: Atherosclerotic calcifications in the abdominal aorta without aneurysm. No significant lymph node enlargement in the abdomen or pelvis. Reproductive: Again noted is an enlarged uterus with a large calcified fibroid. Difficult to evaluate the adnexal structures but no significant change from the previous examination. Other: Again noted is a large heterogeneous low-density mass involving the central and right side of the pelvis. This structure roughly measures 10.9 x 6.5 x 10.6 cm. Again noted is a small amount of gas within this structure. This structure abuts the sigmoid colon and the posterior right side of the uterus. There is a surgical drain that extends between the mass and the uterus. The drain extends up into the right paracolic gutter  region. Again noted is a small rim enhancing fluid collection around the superior aspect of the uterus and near the drain entrance site in the anterior pelvis. This small fluid collection has a maximum thickness of 1.4 cm and has minimally changed from the prior examination. Again noted is a small rim enhancing fluid collection in the posterior pelvis adjacent to the rectum. This pelvic fluid collection measures 1.9 x 1.8 cm image 75/3 and previously measured 2.6 x 1.7 cm. A pigtail drain has been placed in the right lateral abdominal fluid collection. This right lateral abdominal fluid collection is markedly smaller in size. There is a small residual collection along the superior aspect of the drain that measures roughly 1.5 cm on image 47/3. Small fluid collection in the right lower quadrant on image 65/3 is decreased in size. Trace fluid in the left lower quadrant has minimally changed. Trace free fluid or perirectal edema in the pelvis. Musculoskeletal: Grade 2 anterolisthesis of L4 on L5 with disc space narrowing. No suspicious osseous lesion. Diffuse subcutaneous edema. IMPRESSION: 1. Decreased bowel distension compared to the exam on 09/04/2022. 2. Right lateral abdominal fluid collection is decompressed since placement of the percutaneous drain. Small amount of residual fluid around the drain. 3. Residual small fluid collections in the lower abdomen and pelvis as described. A complex collection around the  uterus and surgical drain is minimally changed. No new fluid or abscess collections in the abdomen or pelvis. 4. Again noted is a complex mass/fluid collection in the pelvis. This pelvic mass has minimally changed. 5. Bilateral pleural effusions with compressive atelectasis at the lung bases. 6. Decreased distension of the right renal collecting system. Right nephrostomy tube appears to be well positioned. 7. Stable appearance of the splenic infarct. 8. Fibroid uterus. Electronically Signed   By: Richarda Overlie  M.D.   On: 09/11/2022 16:22   CT ANGIO HEAD NECK W WO CM (CODE STROKE)  Result Date: 09/11/2022 CLINICAL DATA:  Provided history: Neuro deficit, acute, stroke suspected. EXAM: CT ANGIOGRAPHY HEAD AND NECK WITH AND WITHOUT CONTRAST TECHNIQUE: Multidetector CT imaging of the head and neck was performed using the standard protocol during bolus administration of intravenous contrast. Multiplanar CT image reconstructions and MIPs were obtained to evaluate the vascular anatomy. Carotid stenosis measurements (when applicable) are obtained utilizing NASCET criteria, using the distal internal carotid diameter as the denominator. RADIATION DOSE REDUCTION: This exam was performed according to the departmental dose-optimization program which includes automated exposure control, adjustment of the mA and/or kV according to patient size and/or use of iterative reconstruction technique. CONTRAST:  60mL OMNIPAQUE IOHEXOL 350 MG/ML SOLN COMPARISON:  Noncontrast head CT 09/11/2022. CT angiogram head/neck 08/26/2022. FINDINGS: CTA NECK FINDINGS Aortic arch: Common origin of the innominate and left common carotid arteries. Atherosclerotic plaque within the visualized thoracic aorta and within the proximal major branch vessels of the neck. 70% atherosclerotic stenosis within the proximal right subclavian artery, unchanged from the recent prior CTA of 08/26/2022. No hemodynamically significant stenosis within the proximal left subclavian artery. Right carotid system: CCA and ICA patent within the neck. Atherosclerotic plaque about the carotid bifurcation and within the proximal ICA without hemodynamically significant stenosis (50% or greater). Left carotid system: CCA and ICA patent within the neck. Atherosclerotic plaque about the carotid bifurcation and within the proximal ICA without hemodynamically significant stenosis (50% or greater). Mild atherosclerotic plaque also present at the CCA origin and within the mid CCA. Vertebral  arteries: The right vertebral artery is non dominant and developmentally diminutive, but patent within the neck. Throughout the neck. The left vertebral artery is patent within the neck without stenosis. Mild nonstenotic atherosclerotic plaque at the left vertebral artery origin. Skeleton: Cervical spondylosis. No acute fracture or aggressive osseous lesion. Other neck: Subcentimeter nodule within the left thyroid lobe not meeting consensus criteria for ultrasound follow-up based on size. No follow-up imaging recommended. Reference: J Am Coll Radiol. 2015 Feb;12(2): 143-50. Upper chest: Partially imaged bilateral pleural effusions. Review of the MIP images confirms the above findings CTA HEAD FINDINGS Anterior circulation: The intracranial internal carotid arteries are patent. Atherosclerotic plaque within both vessels with no more than mild stenosis. The M1 middle cerebral arteries are patent. Atherosclerotic irregularity of the M2 and more distal MCA vessels bilaterally. No M2 proximal branch occlusion or high-grade proximal stenosis. The anterior cerebral arteries are patent. Hypoplastic right A1 segment. No intracranial aneurysm is identified. Posterior circulation: The intracranial vertebral arteries are patent. The right vertebral artery remains developmentally diminutive intracranially. The basilar artery is patent. The posterior cerebral arteries are patent proximally. Atherosclerotic irregularity of both vessels. Most notably, there are sites of up to severe stenosis is in the right PCA P2 segment, new from the prior CTA of 08/26/2022. Venous sinuses: Within the limitations of contrast timing, no convincing thrombus. Anatomic variants: Scribe as described. Review of the MIP images  confirms the above findings No emergent large vessel occlusion is identified. This finding and the presence of an apparent severe stenosis within the right PCA P2 segment communicated to Dr. Wilford Corner at 1:21 pm on 09/11/2022 by text  page via the [facility] messaging system. IMPRESSION: CTA neck: 1. The common carotid and internal carotid arteries are patent within the neck without hemodynamically significant stenosis. Atherosclerotic plaque bilaterally, as described. 2. The right vertebral artery is non-dominant and developmentally diminutive, but patent throughout the neck. 3. The left vertebral artery is patent within the neck without stenosis. Mild non-stenotic plaque at the origin of this vessel. 4. 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 5. Aortic Atherosclerosis (ICD10-I70.0). CTA head: 1. No intracranial large vessel occlusion is identified. 2. Intracranial atherosclerotic disease as described. Most notably, there are apparent sites of up to severe stenosis within the right PCA P2 segment, new from the prior CTA of 08/26/2022. Electronically Signed   By: Jackey Loge D.O.   On: 09/11/2022 13:24   CT HEAD CODE STROKE WO CONTRAST`  Result Date: 09/11/2022 CLINICAL DATA:  Code stroke. 72 year old female with weakness. 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 EXAM: CT HEAD WITHOUT CONTRAST TECHNIQUE: Contiguous axial images were obtained from the base of the skull through the vertex without intravenous contrast. RADIATION DOSE REDUCTION: This exam was performed according to the departmental dose-optimization program which includes automated exposure control, adjustment of the mA and/or kV according to patient size and/or use of iterative reconstruction technique. COMPARISON:  Head CT 09/01/2022 and brain MRI 08/26/2022. FINDINGS: Brain: Unresolved cytotoxic edema in the right frontal and parietal lobes but appears similar to abnormal diffusion on 08/26/2022 (series 2, image 26). Hypodensity in the left cerebellum is less apparent. No hemorrhagic transformation. No midline shift, mass effect, or evidence of intracranial mass lesion. No ventriculomegaly.  No new areas of cortically based infarct identified. Vascular: No suspicious intracranial vascular hyperdensity. Calcified atherosclerosis at the skull base. Skull: No acute osseous abnormality identified. Sinuses/Orbits: Stable paranasal sinus mucoperiosteal thickening. Tympanic cavities and mastoids remain well aerated. Other: No acute orbit or scalp soft tissue finding. ASPECTS [facility] Stroke Program Early CT Score) Total score (0-10 with 10 being normal): 10; nonacute cytotoxic edema suspected in the right MCA territory. IMPRESSION: 1. Unresolved cytotoxic edema in the Right MCA territory since the MRI on 08/26/2022. Other infarcts at that time less apparent by CT. 2. No new cortically based infarct or acute intracranial hemorrhage identified. 3. These results were communicated to Dr. Wilford Corner at 12:40 pm on 09/11/2022 by text page via the [facility] messaging system. Electronically Signed   By: Odessa Fleming M.D.   On: 09/11/2022 12:41    Labs:  CBC: Recent Labs    09/10/22 0340 09/10/22 1605 09/12/22 0455 09/12/22 1244 09/13/22 0327 09/14/22 0300  WBC 13.1*  --  12.3*  --  14.7* 13.8*  HGB 6.7*   < > 7.1* 7.9* 7.8* 8.0*  HCT 22.9*   < > 24.2* 26.9* 26.3* 26.9*  PLT 1,066*  --  978*  --  973* 951*   < > = values in this interval not displayed.    COAGS: Recent Labs    08/21/22 0259 09/05/22 0037  INR 1.1 1.2    BMP: Recent Labs    09/12/22 0455 09/12/22 1244 09/13/22 0327 09/14/22 0300  NA 134* 140 138 143  K 5.7* 3.9 3.4* 3.7  CL 115* 119* 115* 116*  CO2 11* 14* 15* 16*  GLUCOSE 450* 109* 119* 86  BUN 52* 55* 56* 58*  CALCIUM 9.3 10.3 10.2 10.0  CREATININE 1.78* 1.87* 1.95* 1.98*  GFRNONAA 30* 29* 27* 27*    LIVER FUNCTION TESTS: Recent Labs    09/04/22 0405 09/12/22 0455 09/13/22 0327 09/14/22 0300  BILITOT 0.1* 0.4 0.6 0.5  AST 18 12* 14* 13*  ALT 11 12 13 13   ALKPHOS 71 49 57 59  PROT 4.6* 4.8* 5.1* 5.0*  ALBUMIN 1.5* 1.6* 1.8* 1.8*    Assessment and  Plan:  72 y.o. female with Colonic perforation and R hydronephrosis secondary to obstructing pelvic mass, s/p exploratory laparotomy, right hemicolectomy, ileostomy, mucus fistula, biopsy of pelvic mass by Dr. Bedelia Person 7/6, s/p R PCN placement on 7/15 and RLQ drain placement on 7/20 both performed by Dr. Archer Asa.   WBC fluctuating, 13.8 today (14.7 yesterday, 12.3 the day before) Cx pseudomonas Overnight output 15 mL   Drain Location: RLQ Size: Fr size: 12 Fr Date of placement: 7/20  Currently to: Drain collection device: suction bulb 24 hour output:  Output by Drain (mL) 09/12/22 0701 - 09/12/22 1900 09/12/22 1901 - 09/13/22 0700 09/13/22 0701 - 09/13/22 1900 09/13/22 1901 - 09/14/22 0700 09/14/22 0701 - 09/14/22 1315  Closed System Drain 1 Left LLQ 5 10 5 10    Closed System Drain Lateral RUQ 10 5 5 10      Interval imaging/drain manipulation:  7/15: R PCN placement  7/20: RLQ drain placement  7/26: CA AP with IV contrast  1. Decreased bowel distension compared to the exam on 09/04/2022. 2. Right lateral abdominal fluid collection is decompressed since placement of the percutaneous drain. Small amount of residual fluid around the drain. 3. Residual small fluid collections in the lower abdomen and pelvis as described. A complex collection around the uterus and surgical drain is minimally changed. No new fluid or abscess collections in the abdomen or pelvis. 4. Again noted is a complex mass/fluid collection in the pelvis. This pelvic mass has minimally changed. 5. Bilateral pleural effusions with compressive atelectasis at the lung bases. 6. Decreased distension of the right renal collecting system. Right nephrostomy tube appears to be well positioned. 7. Stable appearance of the splenic infarct. 8. Fibroid uterus.  Current examination: Flushes/aspirates easily.  Insertion site unremarkable. Suture and stat lock in place. Dressed appropriately.   Plan: Continue TID  flushes with 5 cc NS. Record output Q shift. Dressing changes QD or PRN if soiled.  Call IR APP or on call IR MD if difficulty flushing or sudden change in drain output.  Repeat imaging/possible drain injection once output < 10 mL/QD (excluding flush material). Consideration for drain removal if output is < 10 mL/QD (excluding flush material), pending discussion with the providing surgical service.  Discharge planning: Please contact IR APP or on call IR MD prior to patient d/c to ensure appropriate follow up plans are in place. Typically patient will follow up with IR clinic 10-14 days post d/c for repeat imaging/possible drain injection. IR scheduler will contact patient with date/time of appointment. Patient will need to flush drain QD with 5 cc NS, record output QD, dressing changes every 2-3 days or earlier if soiled.   IR will continue to follow - please call with questions or concerns.     Electronically Signed: Willette Brace, PA-C 09/14/2022, 1:13 PM   I spent a total of 25 Minutes at the the patient's bedside AND on the patient's hospital floor or  unit, greater than 50% of which was counseling/coordinating care for RLQ drain f/u.   This chart was dictated using voice recognition software.  Despite best efforts to proofread,  errors can occur which can change the documentation meaning.

## 2022-09-15 DIAGNOSIS — D509 Iron deficiency anemia, unspecified: Secondary | ICD-10-CM | POA: Diagnosis not present

## 2022-09-15 DIAGNOSIS — K922 Gastrointestinal hemorrhage, unspecified: Secondary | ICD-10-CM | POA: Diagnosis not present

## 2022-09-15 DIAGNOSIS — R194 Change in bowel habit: Secondary | ICD-10-CM

## 2022-09-15 DIAGNOSIS — R1900 Intra-abdominal and pelvic swelling, mass and lump, unspecified site: Secondary | ICD-10-CM | POA: Diagnosis not present

## 2022-09-15 DIAGNOSIS — I959 Hypotension, unspecified: Secondary | ICD-10-CM | POA: Diagnosis not present

## 2022-09-15 DIAGNOSIS — I6300 Cerebral infarction due to thrombosis of unspecified precerebral artery: Secondary | ICD-10-CM

## 2022-09-15 DIAGNOSIS — K651 Peritoneal abscess: Secondary | ICD-10-CM

## 2022-09-15 LAB — GLUCOSE, CAPILLARY
Glucose-Capillary: 104 mg/dL — ABNORMAL HIGH (ref 70–99)
Glucose-Capillary: 109 mg/dL — ABNORMAL HIGH (ref 70–99)
Glucose-Capillary: 114 mg/dL — ABNORMAL HIGH (ref 70–99)
Glucose-Capillary: 121 mg/dL — ABNORMAL HIGH (ref 70–99)
Glucose-Capillary: 125 mg/dL — ABNORMAL HIGH (ref 70–99)
Glucose-Capillary: 128 mg/dL — ABNORMAL HIGH (ref 70–99)

## 2022-09-15 MED ORDER — POTASSIUM CHLORIDE 20 MEQ PO PACK
40.0000 meq | PACK | ORAL | Status: AC
  Administered 2022-09-15 (×2): 40 meq
  Filled 2022-09-15 (×2): qty 2

## 2022-09-15 MED ORDER — MAGNESIUM SULFATE 2 GM/50ML IV SOLN
2.0000 g | Freq: Once | INTRAVENOUS | Status: AC
  Administered 2022-09-15: 2 g via INTRAVENOUS
  Filled 2022-09-15: qty 50

## 2022-09-15 NOTE — Progress Notes (Signed)
PROGRESS NOTE  Debra Sharp ZOX:[id number] DOB: 1950/11/26   PCP: Clayborn Heron, MD  Patient is from: Home?  DOA: 08/18/2022 LOS: 28  Chief complaints Chief Complaint  Patient presents with   Weakness     Brief Narrative / Interim history: 72 y.o. female 72 year old female with past medical history of hypertension, hyperlipidemia, systolic CHF, thrombocytopenia, COPD, asthma who initially presented with weakness, abdominal pain. Workup showed anemia concerning for upper GI bleed. CT abdomen/pelvis showed colonic distention, possible obstruction. GI consulted, plan was for EGD but she could not tolerate prep. Also found to have right-sided hydronephrosis, incidental acute stroke on CT/MRI. Repeat CT abdomen/pelvis on 7/6 showed new intraperitoneal free air with presumed perforation of the colon. Surgery consulted and she underwent emergent exploratory laparotomy, right hemicolectomy, ileostomy with fistula, biopsy of pelvic mass. Patient was left intubated and was transferred to ICU. 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  Also found to have acute CVA and acute PE and started on heparin.  Oncology consulted and recommended outpatient follow-up.  Eventually extubated and transferred out of ICU on 7/12.  She now has open laparotomy wound, drains and colostomy.  Neurology, IR, ID, urology and palliative medicine  involved as well.  General surgery following.  Currently on tube feed.  ID recommends IV meropenem for 4 weeks from 7/29.  Subjective: Seen and examined earlier this morning.  No major events overnight of this morning.  Patient is sleepy but wakes to voice.  No complaints but not a great historian.  She is oriented to self, place and the day.  Objective: Vitals:   09/15/22 0756 09/15/22 0759 09/15/22 0804 09/15/22 1123  BP:   (!) 143/62 (!) 141/57  Pulse:   73 82  Resp:   12 19  Temp:   (!) 97.5 F (36.4 C) 98.3 F (36.8 C)  TempSrc:   Oral Oral  SpO2: 96%  96% 98% 98%  Weight:      Height:        Examination:  GENERAL: No apparent distress.  Nontoxic. HEENT: MMM.  Vision and hearing grossly intact.  NECK: Supple.  No apparent JVD.  RESP:  No IWOB.  Fair aeration bilaterally. CVS:  RRR. Heart sounds normal.  ABD/GI/GU: BS+. Abd soft.  Dressing over laparotomy wound.  Colostomy bag.  Drains MSK/EXT:  Moves extremities. No apparent deformity. No edema.  SKIN: no apparent skin lesion or wound NEURO: Sleepy but wakes to voice.  Oriented to self, place and date.  Follows commands.  No apparent focal neuro deficit. PSYCH: Calm. Normal affect.    Microbiology summarized: 7/6-blood cultures NGTD 7/6-MRSA PCR screen 7/15 urine culture NGTD 7/20-abscess culture with Pseudomonas aeruginosa and Streptococcus anginosus 7/28-urine culture NGTD  Assessment and plan: Septic shock secondary to acute peritonitis: Colonic perforation due to obstructing pelvic mass:  -S/p ex lap, right hemicolectomy, ileostomy creation, fistula formation, biopsy of pelvic mass -Abscess culture with Pseudomonas and Streptococcus anginosus. -ID recommends IV meropenem for 4 weeks from 7/29 -Drains in place.  IR managing. -General surgery on board. -On tube feed via cortrack for nutrition   Newly Diagnosed Terminal Ileum Adenocarcinoma with Peritoneal Metastasis: Culprit for colonic perforation.  -Appreciate Dr. Latanya Maudlin recommendations: Possible chemo after she recovers in 1-2 months.  -Overall prognosis is poor, palliative consulted, pt desired full scope of care.   Chronic systolic CHF: LVEF 30 to 35%.  -Appears euvolemic -Monitor I/O, weights.    Acute metabolic encephalopathy: Multifactorial including sepsis, ICU delirium, CVA.. -Treat treatable  causes -Reorientation and delirium precaution -Minimize sedating medication   Acute embolic CVA: Imaging on admission with incidental punctate infarcts in R parietal, L frontal, R cerebellar. MRI done on 7/10 for  left hemiparesis once extubated with acute and late acute infarcts in multiple vascular territories consistent with central embolic source. Repeat MRI on 7/28 shows punctate infarct posterior corona radiata.  Patient and family declined TEE. -CTA 7/26 as part of code stroke does show new severe right P2 PCA stenosis. Not interventional candidate.  -Continue atorvastatin 20 mg.  -Continue eliquis (ok'ed per neurology).    Acute PE, splenic infarct: CT angiogram showed acute PE.  No evidence of right heart strain.   -Continue eliquis.   AKI: Multifactorial including septic shock, hydronephrosis from pelvic mass. Cr seems to have plateaued Recent Labs    09/06/22 0103 09/07/22 0234 09/08/22 0656 09/09/22 0340 09/10/22 0340 09/12/22 0455 09/12/22 1244 09/13/22 0327 09/14/22 0300 09/15/22 0530  BUN 39* 43* 47* 49* 51* 52* 55* 56* 58* 58*  CREATININE 1.18* 1.42* 1.37* 1.49* 1.62* 1.78* 1.87* 1.95* 1.98* 1.81*  -Continue monitoring -Avoid nephrotoxic meds -Continue sodium bicarbonate infusion -Nephrology consult if worse.   Hydronephrosis secondary to abdominal mass: Follow-up CT showed persistent severe right hydroureteronephrosis.  IR did PCN placement on 7/15 (pulled overnight 7/15).  Renal ultrasound negative for hydro on 7/28. -Urology saw pt 7/16--and still flushes easily. -Outpatient follow-up with urology   Acute on chronic blood loss anemia: Received 6u total RBCs this admission, last 7/25.  Hgb down to 7.0 but all cell lines drop likely dilutional from IV fluid.  No overt bleeding. Recent Labs    09/07/22 0234 09/08/22 0656 09/09/22 0340 09/10/22 0340 09/10/22 1605 09/12/22 0455 09/12/22 1244 09/13/22 0327 09/14/22 0300 09/15/22 0530  HGB 8.0* 7.4* 7.5* 6.7* 8.5* 7.1* 7.9* 7.8* 8.0* 7.0*  -Monitor CBC -Continue PPI   Hypertension: Stable   Hyperlipidemia:  -Continue statin   Hyperglycemia: A1c 5.4%. No need for CBG checks.   Left upper extremity edema:  Negative venous U/S, suspect due to hemiplegia.  - Elevate extremity.    Thrombocytosis: Reactive.  Hypokalemia/hypomagnesemia -Monitor replenish as appropriate   Goals of care: Multiple comorbidities and poor prognosis.  Palliative involved but remains full code with full scope of care.  Moderate malnutrition Body mass index is 27.77 kg/m. Nutrition Problem: Moderate Malnutrition Etiology: chronic illness Signs/Symptoms: mild fat depletion, moderate muscle depletion Interventions: MVI, Refer to RD note for recommendations   DVT prophylaxis:  SCDs Start: 08/18/22 1710 apixaban (ELIQUIS) tablet 5 mg  Code Status: Full code Family Communication: None at bedside Level of care: Progressive Status is: Inpatient Remains inpatient appropriate because: Intra-abdominal infection   Final disposition: LTACH? Consultants:  Infectious disease General surgery IR Neurology Palliative medicine Neurology  55 minutes with more than 50% spent in reviewing records, counseling patient/family and coordinating care.   Sch Meds:  Scheduled Meds:  acetaminophen (TYLENOL) oral liquid 160 mg/5 mL  650 mg Per Tube Q6H   apixaban  5 mg Per Tube BID   arformoterol  15 mcg Nebulization BID   ascorbic acid  500 mg Per Tube Daily   atorvastatin  20 mg Per Tube Daily   budesonide (PULMICORT) nebulizer solution  0.5 mg Nebulization BID   Chlorhexidine Gluconate Cloth  6 each Topical Daily   diphenoxylate-atropine  1 tablet Per Tube BID   feeding supplement  237 mL Per Tube TID BM   feeding supplement (PROSource TF20)  60 mL Per Tube Daily  ferrous sulfate  300 mg Per Tube BID   loperamide  2 mg Oral BID   loperamide HCl  2 mg Per Tube BID   melatonin  3 mg Oral QHS   multivitamin with minerals  1 tablet Per Tube Daily   pantoprazole (PROTONIX) IV  40 mg Intravenous Q12H   polycarbophil  625 mg Per Tube BID   revefenacin  175 mcg Nebulization Daily   sertraline  150 mg Per Tube Daily    sodium chloride flush  10-40 mL Intracatheter Q12H   sodium chloride flush  5 mL Intracatheter Q8H   thiamine  100 mg Per Tube Daily   zinc sulfate  220 mg Per Tube Daily   Continuous Infusions:  sodium chloride 250 mL (09/04/22 2000)   feeding supplement (JEVITY 1.5 CAL/FIBER) 55 mL/hr at 09/15/22 0002   meropenem (MERREM) IV Stopped (09/15/22 1035)   promethazine (PHENERGAN) injection (IM or IVPB)     sodium bicarbonate 75 mEq in sodium chloride 0.45 % 1,075 mL infusion 75 mL/hr at 09/15/22 1200   PRN Meds:.albuterol, haloperidol lactate, hydrALAZINE, lip balm, melatonin, morphine injection, naLOXone (NARCAN)  injection, ondansetron **OR** ondansetron (ZOFRAN) IV, mouth rinse, oxyCODONE, promethazine (PHENERGAN) injection (IM or IVPB), sodium chloride flush  Antimicrobials: Anti-infectives (From admission, onward)    Start     Dose/Rate Route Frequency Ordered Stop   09/13/22 1245  meropenem (MERREM) 1 g in sodium chloride 0.9 % 100 mL IVPB        1 g 200 mL/hr over 30 Minutes Intravenous Every 12 hours 09/13/22 1146     09/06/22 1200  ceFEPIme (MAXIPIME) 2 g in sodium chloride 0.9 % 100 mL IVPB  Status:  Discontinued        2 g 200 mL/hr over 30 Minutes Intravenous Every 12 hours 09/06/22 0920 09/13/22 1057   09/04/22 2200  metroNIDAZOLE (FLAGYL) tablet 500 mg  Status:  Discontinued        500 mg Oral Every 12 hours 09/04/22 0741 09/13/22 1101   08/30/22 0830  ceFEPIme (MAXIPIME) 2 g in sodium chloride 0.9 % 100 mL IVPB  Status:  Discontinued        2 g 200 mL/hr over 30 Minutes Intravenous Every 8 hours 08/30/22 0826 09/06/22 0920   08/23/22 2100  ceFEPIme (MAXIPIME) 2 g in sodium chloride 0.9 % 100 mL IVPB  Status:  Discontinued        2 g 200 mL/hr over 30 Minutes Intravenous Every 12 hours 08/23/22 1357 08/30/22 0826   08/23/22 0400  micafungin (MYCAMINE) 100 mg in sodium chloride 0.9 % 100 mL IVPB  Status:  Discontinued        100 mg 105 mL/hr over 1 Hours Intravenous Daily  08/23/22 0242 09/08/22 1029   08/22/22 1800  metroNIDAZOLE (FLAGYL) IVPB 500 mg  Status:  Discontinued        500 mg 100 mL/hr over 60 Minutes Intravenous Every 12 hours 08/22/22 1711 09/04/22 0741   08/22/22 1800  ceFEPIme (MAXIPIME) 2 g in sodium chloride 0.9 % 100 mL IVPB  Status:  Discontinued        2 g 200 mL/hr over 30 Minutes Intravenous Every 8 hours 08/22/22 1716 08/23/22 1357   08/22/22 1724  metroNIDAZOLE (FLAGYL) 500 MG/100ML IVPB       Note to Pharmacy: [name]: cabinet override      08/22/22 1724 08/23/22 0845   08/19/22 1030  cefTRIAXone (ROCEPHIN) 1 g in sodium chloride 0.9 %  100 mL IVPB  Status:  Discontinued        1 g 200 mL/hr over 30 Minutes Intravenous Every 24 hours 08/19/22 0937 08/22/22 1715        I have personally reviewed the following labs and images: CBC: Recent Labs  Lab 09/10/22 0340 09/10/22 1605 09/12/22 0455 09/12/22 1244 09/13/22 0327 09/14/22 0300 09/15/22 0530  WBC 13.1*  --  12.3*  --  14.7* 13.8* 11.1*  HGB 6.7*   < > 7.1* 7.9* 7.8* 8.0* 7.0*  HCT 22.9*   < > 24.2* 26.9* 26.3* 26.9* 23.1*  MCV 84.8  --  86.1  --  84.6 86.5 84.0  PLT 1,066*  --  978*  --  973* 951* 754*   < > = values in this interval not displayed.   BMP &GFR Recent Labs  Lab 09/09/22 0340 09/10/22 0340 09/12/22 0455 09/12/22 1244 09/13/22 0327 09/14/22 0300 09/14/22 1735 09/15/22 0530  NA 135   < > 134* 140 138 143  --  139  K 4.2   < > 5.7* 3.9 3.4* 3.7  --  3.2*  CL 113*   < > 115* 119* 115* 116*  --  111  CO2 14*   < > 11* 14* 15* 16*  --  21*  GLUCOSE 91   < > 450* 109* 119* 86  --  112*  BUN 49*   < > 52* 55* 56* 58*  --  58*  CREATININE 1.49*   < > 1.78* 1.87* 1.95* 1.98*  --  1.81*  CALCIUM 10.0   < > 9.3 10.3 10.2 10.0  --  8.6*  MG 1.7  --   --   --   --   --  1.9 1.6*  PHOS 4.2  --   --   --   --   --  3.8 3.0   < > = values in this interval not displayed.   Estimated Creatinine Clearance: 30.5 mL/min (A) (by C-G formula based on  SCr of 1.81 mg/dL (H)). Liver & Pancreas: Recent Labs  Lab 09/12/22 0455 09/13/22 0327 09/14/22 0300  AST 12* 14* 13*  ALT 12 13 13   ALKPHOS 49 57 59  BILITOT 0.4 0.6 0.5  PROT 4.8* 5.1* 5.0*  ALBUMIN 1.6* 1.8* 1.8*   No results for input(s): "LIPASE", "AMYLASE" in the last 168 hours. No results for input(s): "AMMONIA" in the last 168 hours. Diabetic: No results for input(s): "HGBA1C" in the last 72 hours. Recent Labs  Lab 09/14/22 1958 09/15/22 0039 09/15/22 0353 09/15/22 0831 09/15/22 1126  GLUCAP 143* 125* 109* 104* 128*   Cardiac Enzymes: No results for input(s): "CKTOTAL", "CKMB", "CKMBINDEX", "TROPONINI" in the last 168 hours. No results for input(s): "PROBNP" in the last 8760 hours. Coagulation Profile: No results for input(s): "INR", "PROTIME" in the last 168 hours. Thyroid Function Tests: No results for input(s): "TSH", "T4TOTAL", "FREET4", "T3FREE", "THYROIDAB" in the last 72 hours. Lipid Profile: No results for input(s): "CHOL", "HDL", "LDLCALC", "TRIG", "CHOLHDL", "LDLDIRECT" in the last 72 hours. Anemia Panel: No results for input(s): "VITAMINB12", "FOLATE", "FERRITIN", "TIBC", "IRON", "RETICCTPCT" in the last 72 hours. Urine analysis:    Component Value Date/Time   COLORURINE YELLOW 09/13/2022 1615   APPEARANCEUR HAZY (A) 09/13/2022 1615   LABSPEC 1.020 09/13/2022 1615   PHURINE 5.0 09/13/2022 1615   GLUCOSEU NEGATIVE 09/13/2022 1615   HGBUR SMALL (A) 09/13/2022 1615   HGBUR negative 11/30/2008 1120   BILIRUBINUR NEGATIVE 09/13/2022 1615  KETONESUR NEGATIVE 09/13/2022 1615   PROTEINUR 100 (A) 09/13/2022 1615   UROBILINOGEN 1.0 10/09/2009 2224   NITRITE NEGATIVE 09/13/2022 1615   LEUKOCYTESUR LARGE (A) 09/13/2022 1615   Sepsis Labs: Invalid input(s): "PROCALCITONIN", "LACTICIDVEN"  Microbiology: Recent Results (from the past 240 hour(s))  Urine Culture (for pregnant, neutropenic or urologic patients or patients with an indwelling urinary  catheter)     Status: None   Collection Time: 09/13/22 11:06 AM   Specimen: Urine, Catheterized  Result Value Ref Range Status   Specimen Description URINE, CATHETERIZED  Final   Special Requests NONE  Final   Culture   Final    NO GROWTH Performed at [facility], [address] [place], [place] [address]    Report Status 09/14/2022 FINAL  Final    Radiology Studies: No results found.    Laakea Pereira T. Dreyden Rohrman [facility]  If 7PM-7AM, please contact night-coverage [email or web address] 09/15/2022, 3:38 PM

## 2022-09-15 NOTE — Progress Notes (Signed)
Patient ID: Debra Sharp, female   DOB: January 05, 1951, 72 y.o.   MRN: [id number] 24 Days Post-Op    Subjective: Says good morning and denies complaints but then says numbers over and over ROS negative except as listed above. Objective: Vital signs in last 24 hours: Temp:  [97.5 F (36.4 C)-98.4 F (36.9 C)] 97.5 F (36.4 C) (07/30 0804) Pulse Rate:  [71-103] 73 (07/30 0804) Resp:  [12-20] 12 (07/30 0804) BP: (124-154)/(48-70) 143/62 (07/30 0804) SpO2:  [95 %-100 %] 98 % (07/30 0804) Last BM Date : 09/13/22  Intake/Output from previous day: 07/29 0701 - 07/30 0700 In: 1943.8 [P.O.:240; I.V.:1293.8; NG/GT:310; IV Piggyback:100] Out: 1500 [Urine:500; Drains:50; Stool:950] Intake/Output this shift: Total I/O In: 175 [I.V.:75; IV Piggyback:100] Out: -   General appearance: cooperative Resp: clear to auscultation bilaterally GI: soft, wound OK, stoma with gas and stool, JP tan  Lab Results: CBC  Recent Labs    09/14/22 0300 09/15/22 0530  WBC 13.8* 11.1*  HGB 8.0* 7.0*  HCT 26.9* 23.1*  PLT 951* 754*   BMET Recent Labs    09/14/22 0300 09/15/22 0530  NA 143 139  K 3.7 3.2*  CL 116* 111  CO2 16* 21*  GLUCOSE 86 112*  BUN 58* 58*  CREATININE 1.98* 1.81*  CALCIUM 10.0 8.6*   PT/INR No results for input(s): "LABPROT", "INR" in the last 72 hours. ABG No results for input(s): "PHART", "HCO3" in the last 72 hours.  Invalid input(s): "PCO2", "PO2"  Studies/Results: DG Abd Portable 1V  Result Date: 09/14/2022 CLINICAL DATA:  Feeding tube placement EXAM: PORTABLE ABDOMEN - 1 VIEW COMPARISON:  CT abdomen pelvis 09/11/2022 FINDINGS: Distal tip of the feeding tube located at the expected site of the distal stomach. Right lower quadrant pigtail catheter and right nephrostomy drain are seen. No dilated loops of bowel to indicate ileus or obstruction. Calcified pelvic mass partially visualized. Pelvic surgical drain is again noted. IMPRESSION: Tip of feeding tube located in  the region of the distal stomach. Electronically Signed   By: Acquanetta Belling M.D.   On: 09/14/2022 11:42   MR BRAIN WO CONTRAST  Result Date: 09/13/2022 CLINICAL DATA:  Neuro deficit, acute, stroke suspected. New onset acute left-sided weakness 2 days ago. EXAM: MRI HEAD WITHOUT CONTRAST TECHNIQUE: Multiplanar, multiecho pulse sequences of the brain and surrounding structures were obtained without intravenous contrast. COMPARISON:  CT head without contrast 09/11/2022. MR head without contrast 09/01/2022. FINDINGS: Brain: Diffusion-weighted images demonstrate a punctate focus of restricted diffusion in the posterior corona radiata white matter. No acute or focal cortical abnormalities are present. Extensive T2 and FLAIR hyperintensities are associated with the infarcts that were acute July 10th. Mild periventricular and scattered subcortical T2 hyperintensities are present otherwise. No acute hemorrhage or mass lesion is present. The cerebellar infarcts are less profound than they were on the prior MRI. The internal auditory canals are within normal limits. Midline structures are within normal limits. Deep brain nuclei are within normal limits. The ventricles are of normal size. Vascular: Flow is present in the major intracranial arteries. Skull and upper cervical spine: The craniocervical junction is normal. Upper cervical spine is within normal limits. Marrow signal is unremarkable. Sinuses/Orbits: The left sphenoid sinus is near completely opacified. Mild mucosal thickening is present in the maxillary sinuses and residual ethmoid air cells following ethmoidectomy. A small right mastoid effusion is present. No obstruction is evident. Bilateral lens replacements are noted. Globes and orbits are otherwise unremarkable. IMPRESSION: 1. Punctate focus of restricted  diffusion in the posterior corona radiata white matter compatible with a small acute/subacute nonhemorrhagic infarct. 2. Extensive T2 and FLAIR  hyperintensities associated with the infarcts that were acute July 10th. 3. Mild periventricular and scattered subcortical T2 hyperintensities otherwise are mildly advanced for age. This likely reflects the sequela of chronic microvascular ischemia. 4. Chronic left sphenoid sinus disease. Electronically Signed   By: Marin Roberts M.D.   On: 09/13/2022 17:46   US RENAL  Result Date: 09/13/2022 CLINICAL DATA:  [id number] AKI (acute kidney injury) [facility]) [id number] Obstructive uropathy 201303 EXAM: RENAL / URINARY TRACT ULTRASOUND COMPLETE COMPARISON:  August 30, 2022, September 11, 2022 FINDINGS: Right Kidney: Renal measurements: 10.0 x 4.8 x 4.2 cm = volume: 103 mL. Echogenicity is mildly increased. No mass or hydronephrosis visualized. Resolution of previously visualized hydronephrosis from prior July 14 ultrasound. Left Kidney: Renal measurements: 11.5 x 6.4 x 5.4 cm = volume: 208 mL. Echogenicity within normal limits. No mass or hydronephrosis visualized. Bladder: Bladder is completely decompressed. Other: Small volume free fluid. IMPRESSION: 1. No hydronephrosis. 2. Mildly increased echogenicity of the RIGHT kidney which can be seen in the setting of medical renal disease. 3. Small volume free fluid. Electronically Signed   By: Meda Klinefelter M.D.   On: 09/13/2022 10:24    Anti-infectives: Anti-infectives (From admission, onward)    Start     Dose/Rate Route Frequency Ordered Stop   09/13/22 1245  meropenem (MERREM) 1 g in sodium chloride 0.9 % 100 mL IVPB        1 g 200 mL/hr over 30 Minutes Intravenous Every 12 hours 09/13/22 1146     09/06/22 1200  ceFEPIme (MAXIPIME) 2 g in sodium chloride 0.9 % 100 mL IVPB  Status:  Discontinued        2 g 200 mL/hr over 30 Minutes Intravenous Every 12 hours 09/06/22 0920 09/13/22 1057   09/04/22 2200  metroNIDAZOLE (FLAGYL) tablet 500 mg  Status:  Discontinued        500 mg Oral Every 12 hours 09/04/22 0741 09/13/22 1101   08/30/22 0830  ceFEPIme  (MAXIPIME) 2 g in sodium chloride 0.9 % 100 mL IVPB  Status:  Discontinued        2 g 200 mL/hr over 30 Minutes Intravenous Every 8 hours 08/30/22 0826 09/06/22 0920   08/23/22 2100  ceFEPIme (MAXIPIME) 2 g in sodium chloride 0.9 % 100 mL IVPB  Status:  Discontinued        2 g 200 mL/hr over 30 Minutes Intravenous Every 12 hours 08/23/22 1357 08/30/22 0826   08/23/22 0400  micafungin (MYCAMINE) 100 mg in sodium chloride 0.9 % 100 mL IVPB  Status:  Discontinued        100 mg 105 mL/hr over 1 Hours Intravenous Daily 08/23/22 0242 09/08/22 1029   08/22/22 1800  metroNIDAZOLE (FLAGYL) IVPB 500 mg  Status:  Discontinued        500 mg 100 mL/hr over 60 Minutes Intravenous Every 12 hours 08/22/22 1711 09/04/22 0741   08/22/22 1800  ceFEPIme (MAXIPIME) 2 g in sodium chloride 0.9 % 100 mL IVPB  Status:  Discontinued        2 g 200 mL/hr over 30 Minutes Intravenous Every 8 hours 08/22/22 1716 08/23/22 1357   08/22/22 1724  metroNIDAZOLE (FLAGYL) 500 MG/100ML IVPB       Note to Pharmacy: [name]: cabinet override      08/22/22 1724 08/23/22 0845   08/19/22 1030  cefTRIAXone (  ROCEPHIN) 1 g in sodium chloride 0.9 % 100 mL IVPB  Status:  Discontinued        1 g 200 mL/hr over 30 Minutes Intravenous Every 24 hours 08/19/22 0937 08/22/22 1715       Assessment/Plan: POD 24, s/p exploratory laparotomy, right hemicolectomy, ileostomy, mucus fistula, biopsy of pelvic mass by Dr. 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 Margins negative. 0/43 lymph nodes.  - Poor prognosis, FTT, agree with ongoing GOC discussions - Ileostomy output thicker and less volume with immodium - WOC for stoma teaching and assistance with pouching. - BID wet to dry to midline - CT 7/14 obtained for drain appearing feculent - this showed intra-abdominal fluid collections. IR consulted for  drainage but felt the intra-abdominal fluid collections are small and look like post-op blood. Repeat CT 7/19 similar. S/p IR drain 7/20 with tubry cloudy fluid.  - Cx from IR drain with pseudomonas and strep anginosis. Abx per ID. Repeat CT planned for today by IR/ID. We will follow up on this.  - Cont JP drain - no longer feculent  - IR drain and flushes per IR.  - Mobilize, PT - rec for SNF but now that she has Cortrak would need other feeding access to be placed at SNF if she will not take adequate PO    FEN: Soft diet. Shakes. Cortrak just replaced as patient not eating. VTE: SCDs, Eliquis  ID: Cefepime/flagyl per ID Foley: Removed, Monitor. Urology following. S/p R PCN Dispo - As above   Per [facility] New stroke - Seen on MRI 7/10, suspected thromboembolic, defer to primary team/stroke team for further w/u and management.  Anemia - [facility] has ordered PRBC PE HTN HLD GERD COPD R hydro - s/p R PCN CT 7/14 also suggestion of splenic infarct and bilateral pleural effusions  LOS: 28 days    Violeta Gelinas, MD, MPH, FACS Trauma & General Surgery Use [email or web address] to contact on call provider  09/15/2022

## 2022-09-16 ENCOUNTER — Other Ambulatory Visit: Payer: Self-pay | Admitting: Student

## 2022-09-16 ENCOUNTER — Inpatient Hospital Stay
Admission: RE | Admit: 2022-09-16 | Discharge: 2022-09-26 | Disposition: A | Discharge status: Home / Self Care | Payer: Medicare Other | Source: Other Acute Inpatient Hospital

## 2022-09-16 ENCOUNTER — Other Ambulatory Visit (HOSPITAL_COMMUNITY): Payer: Medicare Other

## 2022-09-16 DIAGNOSIS — K631 Perforation of intestine (nontraumatic): Secondary | ICD-10-CM | POA: Diagnosis not present

## 2022-09-16 DIAGNOSIS — N1330 Unspecified hydronephrosis: Secondary | ICD-10-CM | POA: Diagnosis not present

## 2022-09-16 DIAGNOSIS — K651 Peritoneal abscess: Secondary | ICD-10-CM | POA: Diagnosis not present

## 2022-09-16 DIAGNOSIS — K922 Gastrointestinal hemorrhage, unspecified: Secondary | ICD-10-CM | POA: Diagnosis not present

## 2022-09-16 LAB — GLUCOSE, CAPILLARY
Glucose-Capillary: 100 mg/dL — ABNORMAL HIGH (ref 70–99)
Glucose-Capillary: 102 mg/dL — ABNORMAL HIGH (ref 70–99)
Glucose-Capillary: 124 mg/dL — ABNORMAL HIGH (ref 70–99)
Glucose-Capillary: 92 mg/dL (ref 70–99)
Glucose-Capillary: 96 mg/dL (ref 70–99)

## 2022-09-16 MED ORDER — SODIUM CHLORIDE 0.9 % IV SOLN: 1.0000 g | Freq: Two times a day (BID) | INTRAVENOUS | Status: AC

## 2022-09-16 MED ORDER — CHLORHEXIDINE GLUCONATE CLOTH 2 % EX PADS: 6.0000 | MEDICATED_PAD | Freq: Every day | CUTANEOUS | Status: AC

## 2022-09-16 MED ORDER — ALBUTEROL SULFATE (2.5 MG/3ML) 0.083% IN NEBU: 2.5000 mg | INHALATION_SOLUTION | RESPIRATORY_TRACT | Status: AC | PRN

## 2022-09-16 MED ORDER — REVEFENACIN 175 MCG/3ML IN SOLN: 175.0000 ug | Freq: Every day | RESPIRATORY_TRACT | Status: AC

## 2022-09-16 MED ORDER — NALOXONE HCL 0.4 MG/ML IJ SOLN: 0.4000 mg | INTRAMUSCULAR | Status: AC | PRN

## 2022-09-16 MED ORDER — MORPHINE SULFATE (PF) 2 MG/ML IV SOLN: 2.0000 mg | INTRAVENOUS | Status: AC | PRN

## 2022-09-16 MED ORDER — BUDESONIDE 0.5 MG/2ML IN SUSP: 0.5000 mg | Freq: Two times a day (BID) | RESPIRATORY_TRACT | Status: AC

## 2022-09-16 MED ORDER — FERROUS SULFATE 300 (60 FE) MG/5ML PO SOLN: 300.0000 mg | Freq: Two times a day (BID) | ORAL | Status: AC

## 2022-09-16 MED ORDER — PROSOURCE TF20 ENFIT COMPATIBL EN LIQD: 60.0000 mL | Freq: Every day | ENTERAL | Status: AC

## 2022-09-16 MED ORDER — ARFORMOTEROL TARTRATE 15 MCG/2ML IN NEBU: 15.0000 ug | INHALATION_SOLUTION | Freq: Two times a day (BID) | RESPIRATORY_TRACT | Status: AC

## 2022-09-16 MED ORDER — LOPERAMIDE HCL 1 MG/7.5ML PO SUSP: 2.0000 mg | Freq: Two times a day (BID) | ORAL | Status: AC

## 2022-09-16 MED ORDER — PREDNISONE 20 MG PO TABS: 50.0000 mg | ORAL_TABLET | Freq: Four times a day (QID) | ORAL | Status: DC

## 2022-09-16 MED ORDER — VITAMIN B-1 100 MG PO TABS: 100.0000 mg | ORAL_TABLET | Freq: Every day | ORAL | Status: AC

## 2022-09-16 MED ORDER — ATORVASTATIN CALCIUM 20 MG PO TABS: 20.0000 mg | ORAL_TABLET | Freq: Every day | ORAL | Status: AC

## 2022-09-16 MED ORDER — PANTOPRAZOLE SODIUM 40 MG IV SOLR: 40.0000 mg | Freq: Two times a day (BID) | INTRAVENOUS | Status: AC

## 2022-09-16 MED ORDER — SERTRALINE HCL 50 MG PO TABS: 150.0000 mg | ORAL_TABLET | Freq: Every day | ORAL | Status: AC

## 2022-09-16 MED ORDER — DIPHENOXYLATE-ATROPINE 2.5-0.025 MG PO TABS: 1.0000 | ORAL_TABLET | Freq: Two times a day (BID) | ORAL | Status: AC

## 2022-09-16 MED ORDER — ADULT MULTIVITAMIN W/MINERALS CH: 1.0000 | ORAL_TABLET | Freq: Every day | ORAL | Status: AC

## 2022-09-16 MED ORDER — MELATONIN 10 MG PO TABS: 10.0000 mg | ORAL_TABLET | Freq: Every evening | ORAL | Status: AC | PRN

## 2022-09-16 MED ORDER — APIXABAN 5 MG PO TABS: 5.0000 mg | ORAL_TABLET | Freq: Two times a day (BID) | ORAL | Status: AC

## 2022-09-16 MED ORDER — DIPHENHYDRAMINE HCL 25 MG PO CAPS: 50.0000 mg | ORAL_CAPSULE | Freq: Once | ORAL | Status: DC

## 2022-09-16 MED ORDER — OXYCODONE HCL 5 MG PO TABS: 5.0000 mg | ORAL_TABLET | ORAL | Status: AC | PRN

## 2022-09-16 MED ORDER — CALCIUM POLYCARBOPHIL 625 MG PO TABS: 625.0000 mg | ORAL_TABLET | Freq: Two times a day (BID) | ORAL | Status: AC

## 2022-09-16 MED ORDER — ACETAMINOPHEN 160 MG/5ML PO SOLN: 650.0000 mg | Freq: Four times a day (QID) | ORAL | Status: AC

## 2022-09-16 MED ORDER — SODIUM CHLORIDE 0.9 % IV SOLN: 1.0000 g | Freq: Two times a day (BID) | INTRAVENOUS | Status: DC

## 2022-09-16 MED ORDER — ASCORBIC ACID 500 MG PO TABS: 500.0000 mg | ORAL_TABLET | Freq: Every day | ORAL | Status: AC

## 2022-09-16 MED ORDER — ZINC SULFATE 220 (50 ZN) MG PO CAPS: 220.0000 mg | ORAL_CAPSULE | Freq: Every day | ORAL | Status: AC

## 2022-09-16 MED ORDER — HYDRALAZINE HCL 20 MG/ML IJ SOLN: 10.0000 mg | INTRAMUSCULAR | Status: AC | PRN

## 2022-09-16 MED ORDER — JEVITY 1.5 CAL/FIBER PO LIQD: 1000.0000 mL | ORAL | Status: AC

## 2022-09-16 MED ORDER — ONDANSETRON HCL 4 MG PO TABS: 4.0000 mg | ORAL_TABLET | Freq: Four times a day (QID) | ORAL | Status: AC | PRN

## 2022-09-16 MED ORDER — DIPHENHYDRAMINE HCL 50 MG/ML IJ SOLN: 50.0000 mg | Freq: Once | INTRAMUSCULAR | Status: DC

## 2022-09-16 NOTE — TOC Transition Note (Signed)
Transition of Care (TOC) - CM/SW Discharge Note Donn Pierini RN, BSN Transitions of Care Unit 4E- RN Case Manager See Treatment Team for direct phone #   Patient Details  Name: Debra Sharp MRN: [id number] Date of Birth: [birth year]  Transition of Care [facility]) CM/SW Contact:  Darrold Span, RN Phone Number: 09/16/2022, 2:13 PM   Clinical Narrative:    Pt has been cleared by medical team to transition to [facility]- son is agreeable and [facility] liaison has confirmed bed is available today for transition.   [facility]- room # [phone] Admitted MD-  Dr Manson Passey  RN number to call report- [phone]  Unit staff to transport up to 5E via hospital bed.    Final next level of care: Long Term Acute Care (LTAC) Barriers to Discharge: Barriers Resolved   Patient Goals and CMS Choice CMS Medicare.gov Compare Post Acute Care list provided to:: Patient Represenative (must comment) Choice offered to / list presented to : Adult Children (son)  Discharge Placement               [facility]          Discharge Plan and Services Additional resources added to the After Visit Summary for     Discharge Planning Services: CM Consult Post Acute Care Choice: Long Term Acute Care (LTAC)          DME Arranged: N/A DME Agency: NA       HH Arranged: NA HH Agency: NA        Social Determinants of Health (SDOH) Interventions SDOH Screenings   Food Insecurity: No Food Insecurity (08/19/2022)  Housing: Low Risk  (08/19/2022)  Transportation Needs: No Transportation Needs (08/19/2022)  Utilities: Not At Risk (08/19/2022)  Tobacco Use: Medium Risk (09/05/2022)     Readmission Risk Interventions    09/16/2022    2:12 PM  Readmission Risk Prevention Plan  Transportation Screening Complete  Medication Review (RN Care Manager) Complete  PCP or Specialist appointment within 3-5 days of discharge --  HRI or Home Care Consult Complete  SW Recovery Care/Counseling  Consult Complete  Palliative Care Screening Complete  Skilled Nursing Facility Not Applicable

## 2022-09-16 NOTE — Progress Notes (Signed)
[facility]  Date of Admission:  08/18/2022      Lines: 7/16-c rue picc  Right pcn 7/16-c Right and left jp drain  Colostomy  Abx: 7/28-c meropenem  7/06-28 Cefepime 7/06-28 Flagyl   Micafungin stopped                                                       Assessment: 72 yo female recently dx'ed small bowel cancer, s/p small bowel obstruction/perforation exlap, complicated by intraabd abscesses  IR drain 7/20 x2 - cx in progress but growing strep anginosus and pseudomonas Right hydronephrosis s/p pcn 7/15   Imaging multiple abscesses Abd wound open for 2nd closure   Oncology and gen surgery involved  Of note, she had stroke recently early course of this admission noted on brain mri 7/10 (acute/late infarcts multiple vascular territories concerning for hypoperfusion injury); also complicated by acute pe needing anticoagulation this admission  ------------ 7/29 assessment Patient developed confusion around 7/26 and no obvious cause. Switched cefepime to meropenem on 7/28. 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 Remains in aki with cr hovering 1.8-1.9. normal level <1 early 08/2022   Plan: Continue meropenem Awaiting disposition Plan 4 weeks from 7/29 for abx F/u ir for drain management Appears to be discharging to  ltach Discussed with primary team     Allergies  Allergen Reactions   Iodinated Contrast Media Shortness Of Breath and Itching    Resolved with benadryl and solu-mederol. Consider premedication prior to future studies.   Aspirin Other (See Comments)    Caused an asthma attack   Ibuprofen Swelling and Other (See Comments)    Angioedema and Asthma exacerbation   Sulfonamide Derivatives Swelling and Other (See Comments)    Face swelled SEVERELY   Penicillins Rash    OPAT Orders Discharge antibiotics to be given via PICC line Discharge antibiotics: Meropenem   Duration: 4 weeks from 7/31  End Date: 8/28  [facility] Care Per Protocol:  Home health RN for IV administration and teaching; PICC line care and labs.    Labs weekly while on IV antibiotics: _x_ CBC with differential __ BMP _x_ CMP _x_ CRP __ ESR __ Vancomycin trough __ CK  _x_ Please pull PIC at completion of IV antibiotics __ Please leave PIC in place until doctor has seen patient or been notified  Fax weekly labs to [phone]  Clinic Follow Up Appt: 8/22 @ 330pm  @  [facility] [address], [place], [place] [address] Phone: [phone]    Principal Problem:   GI bleed Active Problems:   Symptomatic anemia   Thrombocytosis   Iron deficiency anemia   Abnormal CT of the abdomen  Colon distention   History of cardioembolic cerebrovascular accident (CVA)   Silent cerebral infarction (HCC)   Bowel perforation (HCC)   Ventilator dependent (HCC)   Pelvic mass   Change in bowel habits   Abnormal CT scan, gastrointestinal tract   Arterial hypotension   Septic shock (HCC)   Fecal occult blood test positive   Uterine leiomyoma   Hydroureteronephrosis   Sepsis with acute hypoxic respiratory failure and septic shock (HCC)   Encephalopathy acute   Perforation bowel (HCC)   Malnutrition of moderate degree   Cerebrovascular accident (CVA) due to embolism of precerebral artery (HCC)    Acute metabolic encephalopathy   Delirium   Adenocarcinoma of small bowel (HCC)   Cerebrovascular accident (CVA) due to thrombosis of precerebral artery (HCC)   Intra-abdominal abscess (HCC)   Allergies  Allergen Reactions   Iodinated Contrast Media Shortness Of Breath and Itching    Resolved with benadryl and solu-mederol. Consider premedication prior to future studies.   Aspirin Other (See Comments)    Caused an asthma attack   Ibuprofen Swelling and Other (See Comments)    Angioedema and Asthma exacerbation   Sulfonamide Derivatives Swelling and Other (See Comments)    Face swelled SEVERELY   Penicillins Rash    Scheduled Meds:  acetaminophen (TYLENOL) oral liquid 160 mg/5 mL  650 mg Per Tube Q6H   apixaban  5 mg Per Tube BID   arformoterol  15 mcg Nebulization BID   ascorbic acid  500 mg Per Tube Daily   atorvastatin  20 mg Per Tube Daily   budesonide (PULMICORT) nebulizer solution  0.5 mg Nebulization BID   Chlorhexidine Gluconate Cloth  6 each Topical Daily   diphenoxylate-atropine  1 tablet Per Tube BID   feeding supplement  237 mL Per Tube TID BM   feeding supplement (PROSource TF20)  60 mL Per Tube Daily   ferrous sulfate  300 mg Per Tube BID   loperamide  2 mg Oral BID   loperamide HCl  2 mg Per Tube BID   melatonin  3 mg Oral QHS   multivitamin with minerals  1 tablet Per Tube Daily   pantoprazole (PROTONIX) IV  40 mg Intravenous Q12H   polycarbophil  625 mg Per Tube BID   revefenacin  175 mcg Nebulization Daily   sertraline  150 mg Per Tube Daily   sodium chloride flush  10-40 mL Intracatheter Q12H   sodium chloride flush  5 mL Intracatheter Q8H   thiamine  100 mg Per Tube Daily   zinc sulfate  220 mg Per Tube Daily   Continuous Infusions:  sodium chloride 250 mL (09/04/22 2000)   feeding supplement (JEVITY 1.5 CAL/FIBER) 55 mL/hr at 09/16/22 0001   meropenem (MERREM) IV 1 g (09/16/22 1059)   promethazine (PHENERGAN) injection (IM or IVPB)     sodium  bicarbonate 75 mEq in sodium chloride 0.45 % 1,075 mL infusion 75 mL/hr at 09/16/22 1244   PRN Meds:.albuterol, haloperidol lactate, hydrALAZINE, lip balm, melatonin, morphine injection, naLOXone (NARCAN)  injection, ondansetron **OR** ondansetron (ZOFRAN) IV, mouth rinse, oxyCODONE, promethazine (PHENERGAN) injection (IM or IVPB), sodium chloride flush   SUBJECTIVE: No complaint Continues to have 2 jp drains, right pcn (hematuria) Abd wound healing No confusion   Review of Systems: ROS All other ROS was negative, except mentioned above     OBJECTIVE: Vitals:   09/16/22 0329 09/16/22 0757 09/16/22 0828 09/16/22 1110  BP: (!) 141/57 (!) 143/55  136/61  Pulse: 76  81  76  Resp: 15 20 18 20   Temp: 98.1 F (36.7 C) 98 F (36.7 C)  98.1 F (36.7 C)  TempSrc: Oral Oral  Oral  SpO2: 100% 99%  98%  Weight:      Height:       Body mass index is 27.77 kg/m.  Physical Exam General/constitutional: no distress, eyes closed, mumbling same words over and over again, somewhat cooperative letting me examine her HEENT: Normocephalic Neck supple CV: rrr no mrg Lungs: clear to auscultation, normal respiratory effort Abd: Soft; jp drain with thick yellow fluid; dressing clean/dry Ext: trace edema bilateral upper and lower extremities Skin: No Rash Neuro: confused; generalized weakness; limited exam MSK: no peripheral joint swelling/tenderness/warmth  Central line presence: rue picc site no erythema or purulence   Lab Results Lab Results  Component Value Date   WBC 13.9 (H) 09/16/2022   HGB 7.5 (L) 09/16/2022   HCT 24.5 (L) 09/16/2022   MCV 86.0 09/16/2022   PLT 780 (H) 09/16/2022    Lab Results  Component Value Date   CREATININE 1.90 (H) 09/16/2022   BUN 65 (H) 09/16/2022   NA 138 09/16/2022   K 4.7 09/16/2022   CL 110 09/16/2022   CO2 20 (L) 09/16/2022    Lab Results  Component Value Date   ALT 13 09/14/2022   AST 13 (L) 09/14/2022   ALKPHOS 59 09/14/2022    BILITOT 0.5 09/14/2022      Microbiology: Recent Results (from the past 240 hour(s))  Urine Culture (for pregnant, neutropenic or urologic patients or patients with an indwelling urinary catheter)     Status: None   Collection Time: 09/13/22 11:06 AM   Specimen: Urine, Catheterized  Result Value Ref Range Status   Specimen Description URINE, CATHETERIZED  Final   Special Requests NONE  Final   Culture   Final    NO GROWTH Performed at [facility], [address] [place], [place] [address]    Report Status 09/14/2022 FINAL  Final     Serology:   Imaging: If present, new imagings (plain films, ct scans, and mri) have been personally visualized and interpreted; radiology reports have been reviewed. Decision making incorporated into the Impression / Recommendations.  7/28 mri brain 1. Punctate focus of restricted diffusion in the posterior corona radiata white matter compatible with a small acute/subacute nonhemorrhagic infarct. 2. Extensive T2 and FLAIR hyperintensities associated with the infarcts that were acute July 10th. 3. Mild periventricular and scattered subcortical T2 hyperintensities otherwise are mildly advanced for age. This likely reflects the sequela of chronic microvascular ischemia. 4. Chronic left sphenoid sinus disease.      7/26 abd pelv ct FINDINGS: Lower chest: Small bilateral pleural effusions with compressive atelectasis at the lung bases.   Hepatobiliary: Stable appearance of the liver and gallbladder. No significant gallbladder distension. Again noted is a small low-density structure in the right hepatic lobe on image 39/3 that is too small to definitively characterize. No significant biliary dilatation.   Pancreas: Stable appearance of the pancreas without significant inflammatory changes.   Spleen: Again noted is marked low-density throughout the spleen with residual areas of contrast enhancement. Findings are suggestive for a  large splenic infarct. Trace fluid around the spleen. History of splenic vein thrombus but this area is poorly characterized on this examination.   Adrenals/Urinary Tract: Normal adrenal glands. Right percutaneous nephrostomy tube is well positioned in the renal pelvis. Persistent dilatation of the right renal collecting system but  this has decreased since the previous CT. Mild dilatation of the right ureter. Small low-density in the posterolateral aspect of the right kidney is again noted. Stable appearance of the left kidney with mild dilatation of the left renal pelvis. No significant dilatation of left ureter.   Stomach/Bowel: Again noted is a large heterogeneous mass in the central and right side of the pelvis which is abutting or involving the sigmoid colon. Colon is decompressed with a mucous fistula. History of right hemicolectomy. Again noted is an ileostomy. Decreased bowel distension compared to the most recent comparison examination. No evidence for bowel obstruction. Normal appearance of the stomach.   Vascular/Lymphatic: Atherosclerotic calcifications in the abdominal aorta without aneurysm. No significant lymph node enlargement in the abdomen or pelvis.   Reproductive: Again noted is an enlarged uterus with a large calcified fibroid. Difficult to evaluate the adnexal structures but no significant change from the previous examination.   Other: Again noted is a large heterogeneous low-density mass involving the central and right side of the pelvis. This structure roughly measures 10.9 x 6.5 x 10.6 cm. Again noted is a small amount of gas within this structure. This structure abuts the sigmoid colon and the posterior right side of the uterus. There is a surgical drain that extends between the mass and the uterus. The drain extends up into the right paracolic gutter region. Again noted is a small rim enhancing fluid collection around the superior aspect of the uterus  and near the drain entrance site in the anterior pelvis. This small fluid collection has a maximum thickness of 1.4 cm and has minimally changed from the prior examination. Again noted is a small rim enhancing fluid collection in the posterior pelvis adjacent to the rectum. This pelvic fluid collection measures 1.9 x 1.8 cm image 75/3 and previously measured 2.6 x 1.7 cm. A pigtail drain has been placed in the right lateral abdominal fluid collection. This right lateral abdominal fluid collection is markedly smaller in size. There is a small residual collection along the superior aspect of the drain that measures roughly 1.5 cm on image 47/3. Small fluid collection in the right lower quadrant on image 65/3 is decreased in size. Trace fluid in the left lower quadrant has minimally changed. Trace free fluid or perirectal edema in the pelvis.   Musculoskeletal: Grade 2 anterolisthesis of L4 on L5 with disc space narrowing. No suspicious osseous lesion. Diffuse subcutaneous edema.   IMPRESSION: 1. Decreased bowel distension compared to the exam on 09/04/2022. 2. Right lateral abdominal fluid collection is decompressed since placement of the percutaneous drain. Small amount of residual fluid around the drain. 3. Residual small fluid collections in the lower abdomen and pelvis as described. A complex collection around the uterus and surgical drain is minimally changed. No new fluid or abscess collections in the abdomen or pelvis. 4. Again noted is a complex mass/fluid collection in the pelvis. This pelvic mass has minimally changed. 5. Bilateral pleural effusions with compressive atelectasis at the lung bases. 6. Decreased distension of the right renal collecting system. Right nephrostomy tube appears to be well positioned. 7. Stable appearance of the splenic infarct. 8. Fibroid uterus.  Raymondo Band, MD [facility] [phone]  pager    09/16/2022, 1:38 PM

## 2022-09-16 NOTE — Discharge Summary (Signed)
Physician Discharge Summary  Debra Sharp UJW:[id number] DOB: 1950/10/26 DOA: 08/18/2022  PCP: Clayborn Heron, MD  Admit date: 08/18/2022 Discharge date: 09/16/2022 Admitted From: Home Disposition: LTAC/select hospital Recommendations for Outpatient Follow-up:  Follow up with neurology as below. Follow-up with general surgery, IR and urology in about a week or two Outpatient follow-up with oncology if functional status improves Check CBC and CMP in 3 days Continue goals of care discussion Please follow up on the following pending results: None   Discharge Condition: Stable but guarded prognosis CODE STATUS: Full code  Follow-up Information     [facility]. Schedule an appointment as soon as possible for a visit in 1 month(s).   Specialty: Neurology Why: stroke clinic Contact information: [address] [place] [address] [phone]                Hospital course 72 y.o. female 72 year old female with past medical history of hypertension, hyperlipidemia, systolic CHF, thrombocytopenia, COPD, asthma who initially presented with weakness, abdominal pain. Workup showed anemia concerning for upper GI bleed. CT abdomen/pelvis showed colonic distention, possible obstruction. GI consulted, plan was for EGD but she could not tolerate prep. Also found to have right-sided hydronephrosis, incidental acute stroke on CT/MRI. Repeat CT abdomen/pelvis on 7/6 showed new intraperitoneal free air with presumed perforation of the colon. Surgery consulted and she underwent emergent exploratory laparotomy, right hemicolectomy, ileostomy with fistula, biopsy of pelvic mass. Patient was left intubated and was transferred to ICU. 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  Also found to have acute CVA and acute PE and started on heparin.  Oncology consulted and recommended outpatient follow-up.  Eventually extubated and  transferred out of ICU on 7/12.  She now has open laparotomy wound, drains and colostomy.  She also had right nephrostomy tube placed by IR.  Neurology, IR, ID, urology and palliative medicine  involved as well.  Currently on tube feed.  ID recommends IV meropenem for 4 weeks from 7/29.  Cleared for discharge to select by general surgery (discussed with Dr. Janee Morn).   See individual problem list below for more.   Problems addressed during this hospitalization Septic shock secondary to acute peritonitis: Colonic perforation due to obstructing pelvic mass:  -S/p ex lap, right hemicolectomy, ileostomy creation, fistula formation, biopsy of pelvic mass -Abscess culture with Pseudomonas and Streptococcus anginosus. -ID recommends IV meropenem for 4 weeks from 7/29 -Drains in place-management per IR.  Recommended 3 times daily flush with 5 cc NS and recording output -Laparotomy wound care per general surgery-recommended daily wet-to-dry dressing change -Tube feed at goal rate of 55 cc an hour   Newly Diagnosed Terminal Ileum Adenocarcinoma with Peritoneal Metastasis: Culprit for colonic perforation.  -Appreciate Dr. Latanya Maudlin recommendations: Possible chemo after she recovers in 1-2 months.  -Overall prognosis is poor, palliative consulted, pt desired full scope of care.   Chronic systolic CHF: LVEF 30 to 35%.  Appears euvolemic.  Not on diuretics.   Acute metabolic encephalopathy: Multifactorial including sepsis, ICU delirium, CVA.  Seems to have resolved.  Oriented x 4 except date today. -Treat treatable causes -Reorientation and delirium precaution   Acute embolic CVA: Imaging on admission with incidental punctate infarcts in R parietal, L frontal, R cerebellar. MRI done on 7/10 for left hemiparesis once extubated with acute and late acute infarcts in multiple vascular territories consistent with central embolic source. Repeat MRI on 7/28 shows punctate infarct posterior corona radiata.  Patient  and  family declined TEE. -CTA 7/26 as part of code stroke does show new severe right P2 PCA stenosis. Not interventional candidate.  -Continue atorvastatin 20 mg.  -Continue eliquis (ok'ed per neurology).    Acute PE, splenic infarct: CT angiogram showed acute PE.  No evidence of right heart strain.   -Continue eliquis.   AKI: Multifactorial including septic shock, hydronephrosis from pelvic mass. Cr seems to have plateaued at 1.9 Recent Labs    09/07/22 0234 09/08/22 0656 09/09/22 0340 09/10/22 0340 09/12/22 0455 09/12/22 1244 09/13/22 0327 09/14/22 0300 09/15/22 0530 09/16/22 0515  BUN 43* 47* 49* 51* 52* 55* 56* 58* 58* 65*  CREATININE 1.42* 1.37* 1.49* 1.62* 1.78* 1.87* 1.95* 1.98* 1.81* 1.90*  -Avoid nephrotoxic meds -Repeat renal function in 3 days   Hydronephrosis secondary to abdominal mass: Follow-up CT showed persistent severe right hydroureteronephrosis.  Right nephrostomy tube placed by IR.  Renal ultrasound negative for hydro on 7/28. -Urology saw pt 7/16--and still flushes easily. -Outpatient follow-up with urology   Acute on chronic blood loss anemia: Received 6u total RBCs this admission, last 7/25.  Hgb stable between 7 and 8.  No overt bleeding. -Continue PPI -Recheck CBC in 3 days   Hypertension: Stable   Hyperlipidemia:  -Continue statin   Hyperglycemia: A1c 5.4%. No need for CBG checks.   Left upper extremity edema: Negative venous U/S, suspect due to hemiplegia.  - Elevate extremity.    Thrombocytosis: Reactive.   Hypokalemia/hypomagnesemia: Resolved.   Goals of care: Multiple comorbidities and poor prognosis.  Palliative involved but remains full code with full scope of care. -Continue ongoing goals of care discussion   Moderate malnutrition Nutrition Problem: Moderate Malnutrition Etiology: chronic illness Signs/Symptoms: mild fat depletion, moderate muscle depletion Interventions: MVI, Refer to RD note for recommendations     Time  spent 45 minutes  Vital signs Vitals:   09/15/22 2353 09/16/22 0329 09/16/22 0757 09/16/22 0828  BP: (!) 134/50 (!) 141/57 (!) 143/55   Pulse: 84 76 81   Temp: 98 F (36.7 C) 98.1 F (36.7 C) 98 F (36.7 C)   Resp: 16 15 20 18   Height:      Weight:      SpO2: 98% 100% 99%   TempSrc: Oral Oral Oral   BMI (Calculated):         Discharge exam  GENERAL: No apparent distress.  Nontoxic. HEENT: MMM.  Vision and hearing grossly intact.  NECK: Supple.  No apparent JVD.  RESP:  No IWOB.  Fair aeration bilaterally. CVS:  RRR. Heart sounds normal.  ABD/GI/GU: BS+. Abd soft.  Dressing over laparotomy wound.  Colostomy bag.  2 JP drains and 1 nephrostomy tube on the right MSK/EXT:  Moves extremities. No apparent deformity. No edema.  SKIN: no apparent skin lesion or wound NEURO: Awake.  Oriented x 4 except date.  Follows commands.  No apparent focal neuro deficit. PSYCH: Calm. Normal affect.   Discharge Instructions Discharge Instructions     Ambulatory referral to Neurology   Complete by: As directed    Follow up with stroke clinic NP (Jessica Vanschaick or Darrol Angel, if both not available, consider Manson Allan, or Ahern) at [facility] in about 4 weeks. Thanks.      Allergies as of 09/16/2022       Reactions   Iodinated Contrast Media Shortness Of Breath, Itching   Resolved with benadryl and solu-mederol. Consider premedication prior to future studies.   Aspirin Other (See Comments)   Caused an asthma  attack   Ibuprofen Swelling, Other (See Comments)   Angioedema and Asthma exacerbation   Sulfonamide Derivatives Swelling, Other (See Comments)   Face swelled SEVERELY   Penicillins Rash        Medication List     STOP taking these medications    cetirizine 10 MG tablet Commonly known as: ZYRTEC   ferrous sulfate 325 (65 FE) MG EC tablet Replaced by: ferrous sulfate 300 (60 Fe) MG/5ML syrup   fluticasone 50 MCG/ACT nasal spray Commonly known as: FLONASE    lisinopril 40 MG tablet Commonly known as: ZESTRIL   lovastatin 20 MG tablet Commonly known as: MEVACOR   ProAir HFA 108 (90 Base) MCG/ACT inhaler Generic drug: albuterol Replaced by: albuterol (2.5 MG/3ML) 0.083% nebulizer solution   Symbicort 160-4.5 MCG/ACT inhaler Generic drug: budesonide-formoterol       TAKE these medications    acetaminophen 160 MG/5ML solution Commonly known as: TYLENOL Place 20.3 mLs (650 mg total) into feeding tube every 6 (six) hours.   albuterol (2.5 MG/3ML) 0.083% nebulizer solution Commonly known as: PROVENTIL Inhale 3 mLs (2.5 mg total) into the lungs every 4 (four) hours as needed for wheezing or shortness of breath. Replaces: ProAir HFA 108 (90 Base) MCG/ACT inhaler   apixaban 5 MG Tabs tablet Commonly known as: ELIQUIS Place 1 tablet (5 mg total) into feeding tube 2 (two) times daily.   arformoterol 15 MCG/2ML Nebu Commonly known as: BROVANA Take 2 mLs (15 mcg total) by nebulization 2 (two) times daily.   ascorbic acid 500 MG tablet Commonly known as: VITAMIN C Place 1 tablet (500 mg total) into feeding tube daily. Start taking on: September 17, 2022   atorvastatin 20 MG tablet Commonly known as: LIPITOR Place 1 tablet (20 mg total) into feeding tube daily.   budesonide 0.5 MG/2ML nebulizer solution Commonly known as: PULMICORT Take 2 mLs (0.5 mg total) by nebulization 2 (two) times daily.   Chlorhexidine Gluconate Cloth 2 % Pads Apply 6 each topically daily. Start taking on: September 17, 2022   diphenoxylate-atropine 2.5-0.025 MG tablet Commonly known as: LOMOTIL Place 1 tablet into feeding tube 2 (two) times daily.   feeding supplement (JEVITY 1.5 CAL/FIBER) Liqd Place 1,000 mLs into feeding tube continuous.   feeding supplement (PROSource TF20) liquid Place 60 mLs into feeding tube daily. Start taking on: September 17, 2022   ferrous sulfate 300 (60 Fe) MG/5ML syrup Place 5 mLs (300 mg total) into feeding tube 2 (two) times  daily. Replaces: ferrous sulfate 325 (65 FE) MG EC tablet   hydrALAZINE 20 MG/ML injection Commonly known as: APRESOLINE Inject 0.5 mLs (10 mg total) into the vein every 4 (four) hours as needed (SBP goal<180).   loperamide HCl 1 MG/7.5ML suspension Commonly known as: IMODIUM Place 15 mLs (2 mg total) into feeding tube 2 (two) times daily.   Melatonin 10 MG Tabs Place 10 mg into feeding tube at bedtime as needed.   meropenem 1 g in sodium chloride 0.9 % 100 mL Inject 1 g into the vein every 12 (twelve) hours for 27 days.   morphine (PF) 2 MG/ML injection Inject 1-2 mLs (2-4 mg total) into the vein every 2 (two) hours as needed (2mg  for moderate pain, 4mg  for severe pain; when taking PO please use oral meds preferentially).   multivitamin with minerals Tabs tablet Place 1 tablet into feeding tube daily.   naloxone 0.4 MG/ML injection Commonly known as: NARCAN Inject 1 mL (0.4 mg total) into the vein as  needed.   ondansetron 4 MG tablet Commonly known as: ZOFRAN Place 1 tablet (4 mg total) into feeding tube every 6 (six) hours as needed for nausea.   oxyCODONE 5 MG immediate release tablet Commonly known as: Oxy IR/ROXICODONE Place 1 tablet (5 mg total) into feeding tube every 4 (four) hours as needed for moderate pain (5mg  for moderate pain, 10mg  for severe pain).   pantoprazole 40 MG injection Commonly known as: PROTONIX Inject 40 mg into the vein every 12 (twelve) hours.   polycarbophil 625 MG tablet Commonly known as: FIBERCON Place 1 tablet (625 mg total) into feeding tube 2 (two) times daily.   revefenacin 175 MCG/3ML nebulizer solution Commonly known as: YUPELRI Take 3 mLs (175 mcg total) by nebulization daily. Start taking on: September 17, 2022   sertraline 50 MG tablet Commonly known as: ZOLOFT Place 3 tablets (150 mg total) into feeding tube daily. What changed:  medication strength how to take this when to take this   thiamine 100 MG tablet Commonly  known as: Vitamin B-1 Place 1 tablet (100 mg total) into feeding tube daily. Start taking on: September 17, 2022   zinc sulfate 220 (50 Zn) MG capsule Place 1 capsule (220 mg total) into feeding tube daily. Start taking on: September 17, 2022        Consultations: General surgery Neurology Neurology Infectious disease Interventional radiology   DG Abd Portable 1V  Result Date: 09/14/2022 CLINICAL DATA:  Feeding tube placement EXAM: PORTABLE ABDOMEN - 1 VIEW COMPARISON:  CT abdomen pelvis 09/11/2022 FINDINGS: Distal tip of the feeding tube located at the expected site of the distal stomach. Right lower quadrant pigtail catheter and right nephrostomy drain are seen. No dilated loops of bowel to indicate ileus or obstruction. Calcified pelvic mass partially visualized. Pelvic surgical drain is again noted. IMPRESSION: Tip of feeding tube located in the region of the distal stomach. Electronically Signed   By: Acquanetta Belling M.D.   On: 09/14/2022 11:42   MR BRAIN WO CONTRAST  Result Date: 09/13/2022 CLINICAL DATA:  Neuro deficit, acute, stroke suspected. New onset acute left-sided weakness 2 days ago. EXAM: MRI HEAD WITHOUT CONTRAST TECHNIQUE: Multiplanar, multiecho pulse sequences of the brain and surrounding structures were obtained without intravenous contrast. COMPARISON:  CT head without contrast 09/11/2022. MR head without contrast 09/01/2022. FINDINGS: Brain: Diffusion-weighted images demonstrate a punctate focus of restricted diffusion in the posterior corona radiata white matter. No acute or focal cortical abnormalities are present. Extensive T2 and FLAIR hyperintensities are associated with the infarcts that were acute July 10th. Mild periventricular and scattered subcortical T2 hyperintensities are present otherwise. No acute hemorrhage or mass lesion is present. The cerebellar infarcts are less profound than they were on the prior MRI. The internal auditory canals are within normal limits.  Midline structures are within normal limits. Deep brain nuclei are within normal limits. The ventricles are of normal size. Vascular: Flow is present in the major intracranial arteries. Skull and upper cervical spine: The craniocervical junction is normal. Upper cervical spine is within normal limits. Marrow signal is unremarkable. Sinuses/Orbits: The left sphenoid sinus is near completely opacified. Mild mucosal thickening is present in the maxillary sinuses and residual ethmoid air cells following ethmoidectomy. A small right mastoid effusion is present. No obstruction is evident. Bilateral lens replacements are noted. Globes and orbits are otherwise unremarkable. IMPRESSION: 1. Punctate focus of restricted diffusion in the posterior corona radiata white matter compatible with a small acute/subacute nonhemorrhagic infarct. 2. Extensive  T2 and FLAIR hyperintensities associated with the infarcts that were acute July 10th. 3. Mild periventricular and scattered subcortical T2 hyperintensities otherwise are mildly advanced for age. This likely reflects the sequela of chronic microvascular ischemia. 4. Chronic left sphenoid sinus disease. Electronically Signed   By: Marin Roberts M.D.   On: 09/13/2022 17:46   US RENAL  Result Date: 09/13/2022 CLINICAL DATA:  119147 AKI (acute kidney injury) [facility]) [id number] Obstructive uropathy 201303 EXAM: RENAL / URINARY TRACT ULTRASOUND COMPLETE COMPARISON:  August 30, 2022, September 11, 2022 FINDINGS: Right Kidney: Renal measurements: 10.0 x 4.8 x 4.2 cm = volume: 103 mL. Echogenicity is mildly increased. No mass or hydronephrosis visualized. Resolution of previously visualized hydronephrosis from prior July 14 ultrasound. Left Kidney: Renal measurements: 11.5 x 6.4 x 5.4 cm = volume: 208 mL. Echogenicity within normal limits. No mass or hydronephrosis visualized. Bladder: Bladder is completely decompressed. Other: Small volume free fluid. IMPRESSION: 1. No hydronephrosis.  2. Mildly increased echogenicity of the RIGHT kidney which can be seen in the setting of medical renal disease. 3. Small volume free fluid. Electronically Signed   By: Meda Klinefelter M.D.   On: 09/13/2022 10:24   CT ABDOMEN PELVIS W CONTRAST  Result Date: 09/11/2022 CLINICAL DATA:  History of colonic perforation secondary to obstructive pelvic mass. Evaluate for abdominal abscess. EXAM: CT ABDOMEN AND PELVIS WITH CONTRAST TECHNIQUE: Multidetector CT imaging of the abdomen and pelvis was performed using the standard protocol following bolus administration of intravenous contrast. RADIATION DOSE REDUCTION: This exam was performed according to the departmental dose-optimization program which includes automated exposure control, adjustment of the mA and/or kV according to patient size and/or use of iterative reconstruction technique. CONTRAST:  60mL OMNIPAQUE IOHEXOL 350 MG/ML SOLN COMPARISON:  CT abdomen pelvis 09/04/2022 FINDINGS: Lower chest: Small bilateral pleural effusions with compressive atelectasis at the lung bases. Hepatobiliary: Stable appearance of the liver and gallbladder. No significant gallbladder distension. Again noted is a small low-density structure in the right hepatic lobe on image 39/3 that is too small to definitively characterize. No significant biliary dilatation. Pancreas: Stable appearance of the pancreas without significant inflammatory changes. Spleen: Again noted is marked low-density throughout the spleen with residual areas of contrast enhancement. Findings are suggestive for a large splenic infarct. Trace fluid around the spleen. History of splenic vein thrombus but this area is poorly characterized on this examination. Adrenals/Urinary Tract: Normal adrenal glands. Right percutaneous nephrostomy tube is well positioned in the renal pelvis. Persistent dilatation of the right renal collecting system but this has decreased since the previous CT. Mild dilatation of the right  ureter. Small low-density in the posterolateral aspect of the right kidney is again noted. Stable appearance of the left kidney with mild dilatation of the left renal pelvis. No significant dilatation of left ureter. Stomach/Bowel: Again noted is a large heterogeneous mass in the central and right side of the pelvis which is abutting or involving the sigmoid colon. Colon is decompressed with a mucous fistula. History of right hemicolectomy. Again noted is an ileostomy. Decreased bowel distension compared to the most recent comparison examination. No evidence for bowel obstruction. Normal appearance of the stomach. Vascular/Lymphatic: Atherosclerotic calcifications in the abdominal aorta without aneurysm. No significant lymph node enlargement in the abdomen or pelvis. Reproductive: Again noted is an enlarged uterus with a large calcified fibroid. Difficult to evaluate the adnexal structures but no significant change from the previous examination. Other: Again noted is a large heterogeneous low-density mass involving the central  and right side of the pelvis. This structure roughly measures 10.9 x 6.5 x 10.6 cm. Again noted is a small amount of gas within this structure. This structure abuts the sigmoid colon and the posterior right side of the uterus. There is a surgical drain that extends between the mass and the uterus. The drain extends up into the right paracolic gutter region. Again noted is a small rim enhancing fluid collection around the superior aspect of the uterus and near the drain entrance site in the anterior pelvis. This small fluid collection has a maximum thickness of 1.4 cm and has minimally changed from the prior examination. Again noted is a small rim enhancing fluid collection in the posterior pelvis adjacent to the rectum. This pelvic fluid collection measures 1.9 x 1.8 cm image 75/3 and previously measured 2.6 x 1.7 cm. A pigtail drain has been placed in the right lateral abdominal fluid  collection. This right lateral abdominal fluid collection is markedly smaller in size. There is a small residual collection along the superior aspect of the drain that measures roughly 1.5 cm on image 47/3. Small fluid collection in the right lower quadrant on image 65/3 is decreased in size. Trace fluid in the left lower quadrant has minimally changed. Trace free fluid or perirectal edema in the pelvis. Musculoskeletal: Grade 2 anterolisthesis of L4 on L5 with disc space narrowing. No suspicious osseous lesion. Diffuse subcutaneous edema. IMPRESSION: 1. Decreased bowel distension compared to the exam on 09/04/2022. 2. Right lateral abdominal fluid collection is decompressed since placement of the percutaneous drain. Small amount of residual fluid around the drain. 3. Residual small fluid collections in the lower abdomen and pelvis as described. A complex collection around the uterus and surgical drain is minimally changed. No new fluid or abscess collections in the abdomen or pelvis. 4. Again noted is a complex mass/fluid collection in the pelvis. This pelvic mass has minimally changed. 5. Bilateral pleural effusions with compressive atelectasis at the lung bases. 6. Decreased distension of the right renal collecting system. Right nephrostomy tube appears to be well positioned. 7. Stable appearance of the splenic infarct. 8. Fibroid uterus. Electronically Signed   By: Richarda Overlie M.D.   On: 09/11/2022 16:22   CT ANGIO HEAD NECK W WO CM (CODE STROKE)  Result Date: 09/11/2022 CLINICAL DATA:  Provided history: Neuro deficit, acute, stroke suspected. EXAM: CT ANGIOGRAPHY HEAD AND NECK WITH AND WITHOUT CONTRAST TECHNIQUE: Multidetector CT imaging of the head and neck was performed using the standard protocol during bolus administration of intravenous contrast. Multiplanar CT image reconstructions and MIPs were obtained to evaluate the vascular anatomy. Carotid stenosis measurements (when applicable) are obtained  utilizing NASCET criteria, using the distal internal carotid diameter as the denominator. RADIATION DOSE REDUCTION: This exam was performed according to the departmental dose-optimization program which includes automated exposure control, adjustment of the mA and/or kV according to patient size and/or use of iterative reconstruction technique. CONTRAST:  60mL OMNIPAQUE IOHEXOL 350 MG/ML SOLN COMPARISON:  Noncontrast head CT 09/11/2022. CT angiogram head/neck 08/26/2022. FINDINGS: CTA NECK FINDINGS Aortic arch: Common origin of the innominate and left common carotid arteries. Atherosclerotic plaque within the visualized thoracic aorta and within the proximal major branch vessels of the neck. 70% atherosclerotic stenosis within the proximal right subclavian artery, unchanged from the recent prior CTA of 08/26/2022. No hemodynamically significant stenosis within the proximal left subclavian artery. Right carotid system: CCA and ICA patent within the neck. Atherosclerotic plaque about the carotid bifurcation and within  the proximal ICA without hemodynamically significant stenosis (50% or greater). Left carotid system: CCA and ICA patent within the neck. Atherosclerotic plaque about the carotid bifurcation and within the proximal ICA without hemodynamically significant stenosis (50% or greater). Mild atherosclerotic plaque also present at the CCA origin and within the mid CCA. Vertebral arteries: The right vertebral artery is non dominant and developmentally diminutive, but patent within the neck. Throughout the neck. The left vertebral artery is patent within the neck without stenosis. Mild nonstenotic atherosclerotic plaque at the left vertebral artery origin. Skeleton: Cervical spondylosis. No acute fracture or aggressive osseous lesion. Other neck: Subcentimeter nodule within the left thyroid lobe not meeting consensus criteria for ultrasound follow-up based on size. No follow-up imaging recommended. Reference: J Am  Coll Radiol. 2015 Feb;12(2): 143-50. Upper chest: Partially imaged bilateral pleural effusions. Review of the MIP images confirms the above findings CTA HEAD FINDINGS Anterior circulation: The intracranial internal carotid arteries are patent. Atherosclerotic plaque within both vessels with no more than mild stenosis. The M1 middle cerebral arteries are patent. Atherosclerotic irregularity of the M2 and more distal MCA vessels bilaterally. No M2 proximal branch occlusion or high-grade proximal stenosis. The anterior cerebral arteries are patent. Hypoplastic right A1 segment. No intracranial aneurysm is identified. Posterior circulation: The intracranial vertebral arteries are patent. The right vertebral artery remains developmentally diminutive intracranially. The basilar artery is patent. The posterior cerebral arteries are patent proximally. Atherosclerotic irregularity of both vessels. Most notably, there are sites of up to severe stenosis is in the right PCA P2 segment, new from the prior CTA of 08/26/2022. Venous sinuses: Within the limitations of contrast timing, no convincing thrombus. Anatomic variants: Scribe as described. Review of the MIP images confirms the above findings No emergent large vessel occlusion is identified. This finding and the presence of an apparent severe stenosis within the right PCA P2 segment communicated to Dr. Wilford Corner at 1:21 pm on 09/11/2022 by text page via the [facility] messaging system. IMPRESSION: CTA neck: 1. The common carotid and internal carotid arteries are patent within the neck without hemodynamically significant stenosis. Atherosclerotic plaque bilaterally, as described. 2. The right vertebral artery is non-dominant and developmentally diminutive, but patent throughout the neck. 3. The left vertebral artery is patent within the neck without stenosis. Mild non-stenotic plaque at the origin of this vessel. 4. 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 5. Aortic Atherosclerosis (ICD10-I70.0). CTA head: 1. No intracranial large vessel occlusion is identified. 2. Intracranial atherosclerotic disease as described. Most notably, there are apparent sites of up to severe stenosis within the right PCA P2 segment, new from the prior CTA of 08/26/2022. Electronically Signed   By: Jackey Loge D.O.   On: 09/11/2022 13:24   CT HEAD CODE STROKE WO CONTRAST`  Result Date: 09/11/2022 CLINICAL DATA:  Code stroke. 72 year old female with weakness. 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 EXAM: CT HEAD WITHOUT CONTRAST TECHNIQUE: Contiguous axial images were obtained from the base of the skull through the vertex without intravenous contrast. RADIATION DOSE REDUCTION: This exam was performed according to the departmental dose-optimization program which includes automated exposure control, adjustment of the mA and/or kV according to patient size and/or use of iterative reconstruction technique. COMPARISON:  Head CT 09/01/2022 and brain MRI 08/26/2022. FINDINGS: Brain: Unresolved cytotoxic edema in the right frontal and parietal lobes but appears similar to abnormal diffusion on 08/26/2022 (series 2, image 26). Hypodensity in the left cerebellum  is less apparent. No hemorrhagic transformation. No midline shift, mass effect, or evidence of intracranial mass lesion. No ventriculomegaly. No new areas of cortically based infarct identified. Vascular: No suspicious intracranial vascular hyperdensity. Calcified atherosclerosis at the skull base. Skull: No acute osseous abnormality identified. Sinuses/Orbits: Stable paranasal sinus mucoperiosteal thickening. Tympanic cavities and mastoids remain well aerated. Other: No acute orbit or scalp soft tissue finding. ASPECTS [facility] Stroke Program Early CT Score) Total score (0-10 with 10 being normal): 10; nonacute cytotoxic edema suspected in the right MCA territory. IMPRESSION:  1. Unresolved cytotoxic edema in the Right MCA territory since the MRI on 08/26/2022. Other infarcts at that time less apparent by CT. 2. No new cortically based infarct or acute intracranial hemorrhage identified. 3. These results were communicated to Dr. Wilford Corner at 12:40 pm on 09/11/2022 by text page via the [facility] messaging system. Electronically Signed   By: Odessa Fleming M.D.   On: 09/11/2022 12:41   CT GUIDED VISCERAL FLUID DRAIN BY PERC CATH  Result Date: 09/05/2022 INDICATION: Postoperative fluid collection concerning for abscess. EXAM: CT GUIDED DRAINAGE OF RIGHT LOWER QUADRANT ABSCESS TECHNIQUE: Multidetector CT imaging of the abdomen was performed following the standard protocol without IV contrast. RADIATION DOSE REDUCTION: This exam was performed according to the departmental dose-optimization program which includes automated exposure control, adjustment of the mA and/or kV according to patient size and/or use of iterative reconstruction technique. MEDICATIONS: The patient is currently admitted to the hospital and receiving intravenous antibiotics. The antibiotics were administered within an appropriate time frame prior to the initiation of the procedure. ANESTHESIA/SEDATION: Moderate (conscious) sedation was employed during this procedure. A total of Versed 1 mg and Fentanyl 25 mcg was administered intravenously by the radiology nurse. Total intra-service moderate Sedation Time: 12 minutes. The patient's level of consciousness and vital signs were monitored continuously by radiology nursing throughout the procedure under my direct supervision. COMPLICATIONS: None immediate. TECHNIQUE: Informed written consent was obtained from the patient after a thorough discussion of the procedural risks, benefits and alternatives. All questions were addressed. Maximal Sterile Barrier Technique was utilized including caps, mask, sterile gowns, sterile gloves, sterile drape, hand hygiene and skin antiseptic. A timeout was  performed prior to the initiation of the procedure. PROCEDURE: The operative field was prepped with chlorhexidine in a sterile fashion, and a sterile drape was applied covering the operative field. A sterile gown and sterile gloves were used for the procedure. Local anesthesia was provided with 1% Lidocaine. CT imaging was obtained. The fluid and gas collection in the right lower quadrant was successfully identified. A suitable skin entry site was selected. Anesthesia was attained by infiltration with 1% lidocaine. A small dermatotomy was made. Under intermittent CT guidance an 18 gauge trocar needle was carefully advanced from a right lower quadrant approach into the fluid and gas collection. A 0.035 wire was then coiled in the collection. The needle was removed. The tract was dilated to 19 [place]. A skater 12 French drain was advanced over the wire and formed. Aspiration yields approximately 20 mL of turbid opaque brownish colored fluid. This was sent for Gram stain and culture. The catheter was then lavaged and connected to JP bulb suction before being secured to the skin with 0 Prolene suture. Follow-up CT imaging demonstrates a well-positioned drainage catheter without evidence of complication. FINDINGS: Well-positioned drainage catheter with resolution of fluid collection. IMPRESSION: Successful 30F drain into the right lower quadrant fluid collection. Aspiration yields turbid opaque brown fluid which was sent for Gram  stain and culture. Electronically Signed   By: Malachy Moan M.D.   On: 09/05/2022 13:46   CT ABDOMEN PELVIS W CONTRAST  Result Date: 09/04/2022 CLINICAL DATA:  Postoperative abdominal pain. Right hemicolectomy and ileostomy 13 days ago. EXAM: CT ABDOMEN AND PELVIS WITH CONTRAST TECHNIQUE: Multidetector CT imaging of the abdomen and pelvis was performed using the standard protocol following bolus administration of intravenous contrast. RADIATION DOSE REDUCTION: This exam was performed  according to the departmental dose-optimization program which includes automated exposure control, adjustment of the mA and/or kV according to patient size and/or use of iterative reconstruction technique. CONTRAST:  75mL OMNIPAQUE IOHEXOL 350 MG/ML SOLN COMPARISON:  CT abdomen and pelvis 08/30/2022 FINDINGS: Lower chest: There are small bilateral pleural effusions and bibasilar atelectasis similar to the prior examination. Hepatobiliary: No focal liver abnormality is seen. No gallstones, gallbladder wall thickening, or biliary dilatation. Pancreas: Unremarkable. No pancreatic ductal dilatation or surrounding inflammatory changes. Spleen: The spleen appears enlarged. In his nearly completely replaced with low-attenuation with some peripheral capsular enhancement. Appearance is similar to the prior study. There is no surrounding inflammatory stranding. Adrenals/Urinary Tract: The bilateral adrenal glands and left kidney are within normal limits. There is a new right-sided percutaneous nephrostomy with distal catheter tip in the right renal pelvis. There is severe right-sided hydronephrosis with some layering hyperdensity in the calices. Hydronephrosis has minimally decreased when compared to the prior study. The right ureter remains dilated to the level of the pelvic inlet, mildly decreased from prior. The bladder appears within normal limits. Stomach/Bowel: Right-sided ostomy is again noted. Oral contrast reaches mid small bowel. Mid and proximal small bowel loops are dilated measuring up to 4 cm and contain air-fluid levels. The tip of an enteric tube is in the gastric antrum. There are nondilated small bowel loops in the right lower quadrant, although definitive transition point is not visualized. Vascular/Lymphatic: Aortic atherosclerosis. Thrombus in the splenic vein appears unchanged on image 3/26. No enlarged abdominal or pelvic lymph nodes. Reproductive: Uterus is enlarged containing calcified fibroids. The  ovaries are not well delineated. Other: Again seen is a percutaneous drainage catheter entering the anterior left lower quadrant abdomen. The catheter traverses the pelvis with distal tip in the right lower quadrant. There is an ill-defined septated enhancing low-density collection in the pelvis abutting the uterus and sigmoid colon. Small foci of air are seen within the collection similar to the prior study. This area measures proximally 9.5 x 6.3 by 10.8 cm and appears similar to the prior examination. Enhancing fluid collection containing air in the right paracolic gutter measures 5.2 x 2.7 by 3.1 cm and appears relatively stable in size. The percutaneous catheter does traverse this collection. Enhancing fluid collection containing a small amount of air abutting the fibroid uterus anteriorly measures 5.4 by 1.4 by 1.0 cm and has minimally decreased in size in the interval. Enhancing fluid collection in the pelvis abutting the rectosigmoid colon image 3/80 appears stable in size measuring 2.6 x 1.6 cm. Small amount of free fluid in the left lower quadrant and diffuse body wall edema appear unchanged. Musculoskeletal: Osseous structures appear stable. IMPRESSION: 1. Dilated small bowel loops with air-fluid levels worrisome for small bowel obstruction. Oral contrast reaches mid small bowel. 2. New right-sided percutaneous nephrostomy with distal catheter tip in the right renal pelvis. There is severe right-sided hydronephrosis which has minimally decreased from the prior study. Right ureter remains dilated to the level of the pelvic inlet, mildly decreased from the prior study. Layering  hyperdensity in the right renal pelvis may represent bladder infection. 3. Stable percutaneous drainage catheters in the left lower quadrant and right lower quadrant. 4. Multiple complex fluid collections, some of which contain air, are again seen throughout the abdomen and pelvis. The larger collections are stable in size. Smaller  collections have minimally decreased in size. 5. Stable small bilateral pleural effusions and bibasilar atelectasis. 6. Stable splenomegaly with near complete replacement of the spleen with low-attenuation and peripheral enhancement. Findings are worrisome for splenic infarct. 7. Stable splenic vein thrombosis. 8. Stable body wall edema. Aortic Atherosclerosis (ICD10-I70.0). Electronically Signed   By: Darliss Cheney M.D.   On: 09/04/2022 15:36   VAS [place] UPPER EXTREMITY VENOUS DUPLEX  Result Date: 09/01/2022 UPPER VENOUS STUDY  Patient Name:  Debra Sharp  Date of Exam:   09/01/2022 Medical Rec #: [id number]      Accession #:    [id number] Date of Birth: [birth year]      Patient Gender: F Patient Age:   26 years Exam Location:  [facility] Procedure:      VAS [place] UPPER EXTREMITY VENOUS DUPLEX Referring Phys: AMRIT ADHIKARI --------------------------------------------------------------------------------  Indications: edema Anticoagulation: Heparin. Limitations: Body habitus, poor ultrasound/tissue interface and patient positioning. Comparison Study: No previous study. Performing Technologist: McKayla Maag RVT, [place]  Examination Guidelines: A complete evaluation includes B-mode imaging, spectral Doppler, color Doppler, and power Doppler as needed of all accessible portions of each vessel. Bilateral testing is considered an integral part of a complete examination. Limited examinations for reoccurring indications may be performed as noted.  Right Findings: +----------+------------+---------+-----------+----------+--------------+ RIGHT     CompressiblePhasicitySpontaneousProperties   Summary     +----------+------------+---------+-----------+----------+--------------+ Subclavian                                          Not visualized +----------+------------+---------+-----------+----------+--------------+  Left Findings: +----------+------------+---------+-----------+----------+--------------+ LEFT       CompressiblePhasicitySpontaneousProperties   Summary     +----------+------------+---------+-----------+----------+--------------+ IJV           Full       Yes       Yes                             +----------+------------+---------+-----------+----------+--------------+ Subclavian    Full       Yes       Yes                             +----------+------------+---------+-----------+----------+--------------+ Axillary      Full       Yes       Yes                             +----------+------------+---------+-----------+----------+--------------+ Brachial      Full       Yes       Yes                             +----------+------------+---------+-----------+----------+--------------+ Radial        Full                                                 +----------+------------+---------+-----------+----------+--------------+  Ulnar         Full                                     limited     +----------+------------+---------+-----------+----------+--------------+ Cephalic      Full                                                 +----------+------------+---------+-----------+----------+--------------+ Basilic                                             Not visualized +----------+------------+---------+-----------+----------+--------------+  Summary:  Left: No evidence of deep vein thrombosis in the upper extremity. No evidence of superficial vein thrombosis in the upper extremity. However, unable to visualize the basilic vein.  *See table(s) above for measurements and observations.  Diagnosing physician: Waverly Ferrari MD Electronically signed by Waverly Ferrari MD on 09/01/2022 at 4:49:19 PM.    Final    [place] EKG SITE RITE  Result Date: 09/01/2022 If Site Rite image not attached, placement could not be confirmed due to current cardiac rhythm.  CT HEAD WO CONTRAST ( )  Result Date: 09/01/2022 CLINICAL DATA:  Delirium EXAM: CT HEAD WITHOUT  CONTRAST TECHNIQUE: Contiguous axial images were obtained from the base of the skull through the vertex without intravenous contrast. RADIATION DOSE REDUCTION: This exam was performed according to the departmental dose-optimization program which includes automated exposure control, adjustment of the mA and/or kV according to patient size and/or use of iterative reconstruction technique. COMPARISON:  08/26/2022 FINDINGS: Brain: The study suffers from some motion degradation. Generalized atrophy is again demonstrated. Areas of acute infarction shown by MRI 6 days ago are difficult to appreciate on this CT exam. There is probably some visible low-density in the right watershed distribution superiorly. No evidence of mass effect or hemorrhage. No new insult is identifiable. No hydrocephalus. No extra-axial collection. Vascular: There is atherosclerotic calcification of the major vessels at the base of the brain. Skull: Negative Sinuses/Orbits: Previous FESS. Chronic inflammation of the left division of the sphenoid sinus. No other advanced sinus disease. Orbits negative. Other: None IMPRESSION: No new finding by CT. Areas of acute infarction shown by MRI 6 days ago are difficult to appreciate on this CT exam. There is probably some visible low-density in the right watershed distribution superiorly. No evidence of mass effect or hemorrhage. No new insult is identifiable. Electronically Signed   By: Paulina Fusi M.D.   On: 09/01/2022 13:21   IR NEPHROSTOMY PLACEMENT RIGHT  Result Date: 08/31/2022 INDICATION: 72 year old female with obstructing pelvic mass and right-sided hydronephrosis. She presents for placement of a percutaneous nephrostomy tube. EXAM: IR NEPHROSTOMY PLACEMENT RIGHT COMPARISON:  CT abdomen/pelvis 08/30/2022 MEDICATIONS: In patient receiving multiple intravenous antibiotics. No additional prophylaxis was administered. ANESTHESIA/SEDATION: Fentanyl 25 mcg IV; Versed 1 mg IV Moderate Sedation Time:  14  minutes The patient's vital signs and level of consciousness were continuously monitored during the procedure by the interventional radiology nurse under my direct supervision. CONTRAST:  7 mL OMNIPAQUE IOHEXOL 300 MG/ML SOLN - administered into the collecting system(s) FLUOROSCOPY: Radiation exposure index: 2.2 mGy reference air kerma COMPLICATIONS: None immediate. TECHNIQUE: The procedure,  risks, benefits, and alternatives were explained to the patient. Questions regarding the procedure were encouraged and answered. The patient understands and consents to the procedure. The right flank was prepped with chlorhexidine in a sterile fashion, and a sterile drape was applied covering the operative field. A sterile gown and sterile gloves were used for the procedure. Local anesthesia was provided with 1% Lidocaine. The right flank was interrogated with ultrasound and the left kidney identified. The kidney is hydronephrotic. A suitable access site on the skin overlying the lower pole, posterior calix was identified. After local mg anesthesia was achieved, a small skin nick was made with an 11 blade scalpel. A 21 gauge Accustick needle was then advanced under direct sonographic guidance into the lower pole of the right kidney. A 0.018 inch wire was advanced under fluoroscopic guidance into the left renal collecting system. The Accustick sheath was then advanced over the wire and a 0.018 system exchanged for a 0.035 system. Gentle hand injection of contrast material confirms placement of the sheath within the renal collecting system. There is significant hydronephrosis. The tract from the scan into the renal collecting system was then dilated serially to 10-French. A 10-French Cook all-purpose drain was then placed and positioned under fluoroscopic guidance. The locking loop is well formed within the left renal pelvis. The catheter was secured to the skin with 2-0 Prolene and a sterile bandage was placed. Catheter was left  to gravity bag drainage. IMPRESSION: Successful placement of a right 10 French percutaneous nephrostomy tube. A sample of aspirated urine was sent for Gram stain and culture. Electronically Signed   By: Malachy Moan M.D.   On: 08/31/2022 17:11   CT ABDOMEN PELVIS W CONTRAST  Result Date: 08/30/2022 CLINICAL DATA:  Abdominal pain. Right hemicolectomy and ileostomy 8 days ago. Now with feculent drainage EXAM: CT ABDOMEN AND PELVIS WITH CONTRAST TECHNIQUE: Multidetector CT imaging of the abdomen and pelvis was performed using the standard protocol following bolus administration of intravenous contrast. RADIATION DOSE REDUCTION: This exam was performed according to the departmental dose-optimization program which includes automated exposure control, adjustment of the mA and/or kV according to patient size and/or use of iterative reconstruction technique. CONTRAST:  75mL OMNIPAQUE IOHEXOL 350 MG/ML SOLN COMPARISON:  08/22/2022, 08/19/2022 FINDINGS: Lower chest: Small bilateral pleural effusions, left greater than right. Effusions have increased in size from prior. Associated compressive atelectasis. Hepatobiliary: Liver is enlarged measuring approximately 21 cm in length. No focal liver lesion identified. Gallbladder within normal limits. Pancreas: Unremarkable. No pancreatic ductal dilatation or surrounding inflammatory changes. Spleen: Enlarged spleen with new, near-complete hypoattenuation of the splenic parenchyma. There also appears to be subcapsular low-density fluid along the peripheral margin of the spleen. Adrenals/Urinary Tract: Unremarkable adrenal glands. Severe right hydroureteronephrosis. Layering high attenuation material within the right renal collecting system is likely related to contrast excretion. New mild left hydronephrosis. Urinary bladder within normal limits. Stomach/Bowel: Enteric tube extends into the stomach with distal tip terminating at the level of the duodenal bulb. Interval  partial right hemicolectomy with right lower quadrant ostomy. Multiple mildly dilated small bowel loops measuring up to 4.4 cm in diameter. Numerous air-fluid levels. Enteric contrast has partially traversed small bowel. Vascular/Lymphatic: Aortic atherosclerosis. No definite lymphadenopathy. Reproductive: Large calcified uterine fibroid. Ovaries are not definitively seen. Other: Midline surgical incision site. Surgical drain is present within the pelvis with distal tip terminating in the lower right hemiabdomen. Small volume fluid within the abdomen and pelvis with several collections demonstrating partial rim enhancement. For  example, collection within the lower right pericolic gutter measures approximately 5.2 x 3.5 x 4.1 cm (series 3, image 58). Collection along the anterior margin of the uterine fundus measures approximately 7.2 x 1.8 x 5.1 cm (series 3, image 69). Small amount of air is noted along the course of the surgical drain. No significant volume of pneumoperitoneum. Large complex mass occupying the majority of the low right pelvis is grossly unchanged. Musculoskeletal: No acute osseous abnormality. Increased body wall edema. IMPRESSION: 1. Interval postsurgical changes to the abdomen and pelvis including partial right hemicolectomy with right lower quadrant ostomy. 2. Small volume fluid within the abdomen and pelvis with several collections demonstrating rim-enhancement suggestive of abscesses. 3. New, near-complete hypoattenuation of the splenic parenchyma with subcapsular low-density fluid along the peripheral margin of the spleen. Findings are concerning for global splenic infarct. 4. Multiple mildly dilated small bowel loops with numerous air-fluid levels. Findings are favored to represent postoperative ileus. 5. Persistent severe right hydroureteronephrosis. New mild left hydronephrosis. 6. Small bilateral pleural effusions, left greater than right. Effusions have increased in size from prior. 7.  Large complex mass occupying the majority of the low right pelvis is grossly unchanged. 8. Hepatomegaly. 9. Aortic atherosclerosis (ICD10-I70.0). These results will be called to the ordering clinician or representative by the Radiologist Assistant, and communication documented in the PACS or [employer]. Electronically Signed   By: Duanne Guess D.O.   On: 08/30/2022 15:05   VAS [place] LOWER EXTREMITY VENOUS (DVT)  Result Date: 08/28/2022  Lower Venous DVT Study Patient Name:  Debra Sharp  Date of Exam:   08/27/2022 Medical Rec #: [id number]      Accession #:    [id number] Date of Birth: [birth year]      Patient Gender: F Patient Age:   28 years Exam Location:  [facility] Procedure:      VAS [place] LOWER EXTREMITY VENOUS (DVT) Referring Phys: Scheryl Marten XU --------------------------------------------------------------------------------  Indications: Hypercoagulable state.  Limitations: Body habitus and poor ultrasound/tissue interface. Comparison Study: No previous exams Performing Technologist: Jody Hill RVT, RDMS  Examination Guidelines: A complete evaluation includes B-mode imaging, spectral Doppler, color Doppler, and power Doppler as needed of all accessible portions of each vessel. Bilateral testing is considered an integral part of a complete examination. Limited examinations for reoccurring indications may be performed as noted. The reflux portion of the exam is performed with the patient in reverse Trendelenburg.  +---------+---------------+---------+-----------+----------+-------------------+ RIGHT    CompressibilityPhasicitySpontaneityPropertiesThrombus Aging      +---------+---------------+---------+-----------+----------+-------------------+ CFV      Full           Yes      Yes                                      +---------+---------------+---------+-----------+----------+-------------------+ SFJ      Full                                                              +---------+---------------+---------+-----------+----------+-------------------+ FV Prox  Full           Yes      Yes                                      +---------+---------------+---------+-----------+----------+-------------------+  FV Mid   Full           Yes      Yes                  Not well visualized +---------+---------------+---------+-----------+----------+-------------------+ FV DistalFull           Yes      Yes                                      +---------+---------------+---------+-----------+----------+-------------------+ PFV                     Yes      Yes                                      +---------+---------------+---------+-----------+----------+-------------------+ POP      Full           Yes      Yes                                      +---------+---------------+---------+-----------+----------+-------------------+ PTV                                                   Not visualized      +---------+---------------+---------+-----------+----------+-------------------+ PERO     Full                                         Not well visualized +---------+---------------+---------+-----------+----------+-------------------+   +---------+---------------+---------+-----------+----------+-------------------+ LEFT     CompressibilityPhasicitySpontaneityPropertiesThrombus Aging      +---------+---------------+---------+-----------+----------+-------------------+ CFV      Full           Yes      Yes                                      +---------+---------------+---------+-----------+----------+-------------------+ SFJ      Full                                                             +---------+---------------+---------+-----------+----------+-------------------+ FV Prox  Full           Yes      Yes                                      +---------+---------------+---------+-----------+----------+-------------------+ FV  Mid   Full           Yes      Yes                  Not well visualized +---------+---------------+---------+-----------+----------+-------------------+ FV DistalFull           Yes  Yes                                      +---------+---------------+---------+-----------+----------+-------------------+ PFV                     Yes      Yes                                      +---------+---------------+---------+-----------+----------+-------------------+ POP      Full           Yes      Yes                                      +---------+---------------+---------+-----------+----------+-------------------+ PTV                                                   Not visualized      +---------+---------------+---------+-----------+----------+-------------------+ PERO                                                  Not visualized      +---------+---------------+---------+-----------+----------+-------------------+     Summary: BILATERAL: -No evidence of popliteal cyst, bilaterally. RIGHT: - There is no evidence of deep vein thrombosis in the lower extremity. However, portions of this examination were limited- see technologist comments above.  LEFT: - There is no evidence of deep vein thrombosis in the lower extremity. However, portions of this examination were limited- see technologist comments above.  *See table(s) above for measurements and observations. Electronically signed by Gerarda Fraction on 08/28/2022 at 5:34:47 PM.    Final    CT Angio Chest Pulmonary Embolism (PE) W or WO Contrast  Result Date: 08/27/2022 CLINICAL DATA:  Pulmonary embolism (PE) suspected, high prob Weakness and shortness of breath. EXAM: CT ANGIOGRAPHY CHEST WITH CONTRAST TECHNIQUE: Multidetector CT imaging of the chest was performed using the standard protocol during bolus administration of intravenous contrast. Multiplanar CT image reconstructions and MIPs were obtained to evaluate the vascular  anatomy. RADIATION DOSE REDUCTION: This exam was performed according to the departmental dose-optimization program which includes automated exposure control, adjustment of the mA and/or kV according to patient size and/or use of iterative reconstruction technique. CONTRAST:  75mL OMNIPAQUE IOHEXOL 350 MG/ML SOLN COMPARISON:  Radiograph 08/23/2022 FINDINGS: Cardiovascular: Positive for small volume acute pulmonary embolus, involving a subsegmental branch in the right upper lobe, for example series 7, image 99. Small amount of thrombus within right lower lobar pulmonary artery series 7, image 141, extending into the segmental and subsegmental branches. Thromboembolic burden is small to moderate. There is no right heart strain. Moderate calcified as well as noncalcified atheromatous plaque throughout the thoracic aorta, including rounded plaque in the descending aorta series 5, images 84 and 106. No aortic aneurysm. There are coronary artery calcifications. Heart size upper normal. Suggestion of lipomatous hypertrophy of the interatrial septum. Small hiatal hernia. Mediastinum/Nodes: Enteric tube tip in the stomach. Well-circumscribed fluid density  structure in the anterior mediastinum measures 2.1 x 2.1 cm series 5, image 57. Well-defined rounded fat density structures in the left suprahilar region, Hounsfield units of -124, 18 and 7 mm series 5, image 57. This appears to be contiguous with the mediastinal fat. Lungs/Pleura: Small to moderate left and small right pleural effusion. Associated compressive atelectasis. No definite pulmonary infarct. 3-4 mm subpleural right upper lobe nodule has mild surrounding ground-glass, series 6, image 55. No endobronchial debris. Upper Abdomen: Small amount of free fluid in the left upper quadrant. Musculoskeletal: There are no acute or suspicious osseous abnormalities. Review of the MIP images confirms the above findings. IMPRESSION: 1. Positive for acute pulmonary embolus,  involving a subsegmental branch in the right upper lobe and right lower lobar pulmonary artery. Thromboembolic burden is small to moderate. No right heart strain. 2. Small to moderate left and small right pleural effusions with compressive atelectasis. 3. Well-circumscribed fluid density structure in the anterior mediastinum measuring 2.1 x 2.1 cm. This may represent a thymic cyst. Recommend MRI for further assessment on an elective basis after resolution of acute event. 4. There is also a well-defined fat density structure in the left suprahilar region which appears to be contiguous with the mediastinal fat. Etiology is uncertain. 5. A 3-4 mm subpleural right upper lobe nodule has mild surrounding ground-glass. This is likely infectious or inflammatory. Per Fleischner Society Guidelines, no routine follow-up imaging is recommended. These guidelines do not apply to immunocompromised patients and patients with cancer. Follow up in patients with significant comorbidities as clinically warranted. For lung cancer screening, adhere to Lung-RADS guidelines. Reference: Radiology. 2017; 284(1):228-43. 6. Moderate calcified as well as noncalcified atheromatous plaque throughout the thoracic aorta, including rounded plaque in the descending aorta. 7. Aortic Atherosclerosis (ICD10-I70.0). Coronary artery calcifications. Critical Value/emergent results were called by telephone at the time of interpretation on 08/27/2022 at 6:56 PM to provider Karie Fetch , who verbally acknowledged these results. Electronically Signed   By: Narda Rutherford M.D.   On: 08/27/2022 19:02   US RENAL  Result Date: 08/27/2022 CLINICAL DATA:  Hydronephrosis EXAM: RENAL / URINARY TRACT ULTRASOUND COMPLETE COMPARISON:  CT abdomen pelvis 08/22/2022 FINDINGS: Right Kidney: Renal measurements: 11.1 x 5.8 x 5.4 cm = volume: 181 mL. Diffusely thinned cortex. Unchanged severe hydronephrosis. Left Kidney: Renal measurements: 11.9 x 8.4 x 5.4 cm = volume: 16  mL. Echogenicity within normal limits. No mass or hydronephrosis visualized. Bladder: Not visualized. Other: None. IMPRESSION: Unchanged severe right hydronephrosis. Electronically Signed   By: Acquanetta Belling M.D.   On: 08/27/2022 12:00   CT ANGIO HEAD NECK W WO CM  Result Date: 08/26/2022 CLINICAL DATA:  Follow-up embolic strokes EXAM: CT ANGIOGRAPHY HEAD AND NECK WITH AND WITHOUT CONTRAST TECHNIQUE: Multidetector CT imaging of the head and neck was performed using the standard protocol during bolus administration of intravenous contrast. Multiplanar CT image reconstructions and MIPs were obtained to evaluate the vascular anatomy. Carotid stenosis measurements (when applicable) are obtained utilizing NASCET criteria, using the distal internal carotid diameter as the denominator. RADIATION DOSE REDUCTION: This exam was performed according to the departmental dose-optimization program which includes automated exposure control, adjustment of the mA and/or kV according to patient size and/or use of iterative reconstruction technique. 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 No evidence of progression or hemorrhage. No hydrocephalus  or shift. Vascular: See below Skull: No acute or aggressive Sinuses/Orbits: Finding sinonasal polyps. Chronic left sphenoid sinusitis with sclerotic wall thickening. Review of the MIP images confirms the above findings CTA NECK FINDINGS Aortic arch: Atheromatous plaque with 3 vessel branching. Right carotid system: Mainly calcified atherosclerosis at the bifurcation without stenosis or ulceration. Left carotid system: Mainly calcified atherosclerosis at the bifurcation without stenosis or ulceration. 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  The left vertebral artery is dominant. At the proximal right V2 segment there is hazy hypoenhancement of the right vertebral lumen. No ulceration, beading, or dissection flap. Robust and symmetric flow by the V4 segments. Skeleton: Generalized cervical spine degeneration. Other neck: No acute finding Upper chest: Layering pleural effusions. Left IJ line with tip at the SVC. An enteric tube traverses the esophagus. Review of the MIP images confirms the above findings CTA HEAD FINDINGS Anterior circulation: Atheromatous plaque is mild. No major branch occlusion, beading, or irregularity. Hypoplastic right A1 segment. Negative for aneurysm. Posterior circulation: Left dominant vertebral artery. The vertebral and basilar arteries are smoothly contoured and diffusely patent. Mild atheromatous irregularity of the posterior cerebral arteries. Venous sinuses: Unremarkable Anatomic variants: None significant Review of the MIP images confirms the above findings IMPRESSION: 1. No emergent arterial finding. 2. 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 3. Short segment of right V2 hypoenhancement without discrete underlying stenosis or dissection flap. 4. No flow limiting stenosis in the anterior circulation. Electronically Signed   By: Tiburcio Pea M.D.   On: 08/26/2022 20:24   DG Abd Portable 1V  Result Date: 08/26/2022 CLINICAL DATA:  Feeding tube placement. EXAM: PORTABLE ABDOMEN - 1 VIEW COMPARISON:  August 25, 2022. FINDINGS: Distal tip of feeding tube is seen in expected position of distal stomach. IMPRESSION: Distal tip of feeding tube is seen in expected position of distal stomach. Electronically Signed   By: Lupita Raider M.D.   On: 08/26/2022 13:34   MR BRAIN WO CONTRAST  Result Date: 08/26/2022 CLINICAL DATA:  Neuro deficit, acute, stroke suspected neuro change, code stroke at 1830. EXAM: MRI HEAD WITHOUT CONTRAST TECHNIQUE: Multiplanar, multiecho pulse sequences of the brain and  surrounding structures were obtained without intravenous contrast. COMPARISON:  Head CT 08/24/2022.  MRI brain 08/20/2022, 08/18/2022. FINDINGS: Brain: Cortical restricted diffusion along the right-greater-than-left superior frontal sulci, as well as in the right parietal lobe and right occipital lobe, with associated edema and partial sulcal effacement, consistent with late acute infarction. Few foci of acute infarction in the left occipital lobe and left cerebellar hemisphere. No acute hemorrhage or significant mass effect. No hydrocephalus or extra-axial collection. Vascular: Normal flow voids. Skull and upper cervical spine: Normal marrow signal. Sinuses/Orbits: Unremarkable. Other: None. IMPRESSION: 1. 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 2. No acute hemorrhage or significant mass effect. Electronically Signed   By: Orvan Falconer M.D.   On: 08/26/2022 12:53   DG Abd 1 View  Result Date: 08/25/2022 CLINICAL DATA:  [id number] Encounter for imaging study to confirm orogastric (OG) tube placement [id number] EXAM: ABDOMEN - 1 VIEW COMPARISON:  08/22/2022 FINDINGS: Enteric tube terminates within the gastric antrum. There is a small amount of contrast within the gastric fundus. Included bowel gas pattern is nonobstructive. Overlying monitoring leads. IMPRESSION: Enteric tube terminates within the gastric antrum. Electronically Signed   By: Duanne Guess D.O.   On: 08/25/2022 10:54  EEG adult  Result Date: 08/25/2022 Charlsie Quest, MD     08/25/2022  9:09 AM Patient Name: Debra Sharp MRN: [id number] Epilepsy Attending: Charlsie Quest Referring Physician/Provider: Caryl Pina, MD Date: 08/25/2022 Duration: 26.05 mins Patient history: 72 y.o. female with left side weakness. EEG to evaluate for seizure. Level of alertness: Awake AEDs during EEG study: None Technical aspects: This EEG study was done with scalp electrodes positioned according to the  10-20 International system of electrode placement. Electrical activity was reviewed with band pass filter of 1-70Hz , sensitivity of 7 uV/mm, display speed of 79mm/sec with a 60Hz  notched filter applied as appropriate. EEG data were recorded continuously and digitally stored.  Video monitoring was available and reviewed as appropriate. Description: No clear posterior dominant rhythm was seen. EEG showed continuous generalized 3 to 6 Hz theta-delta slowing, at times with triphasic morphology.  Hyperventilation and photic stimulation were not performed.   ABNORMALITY - Continuous slow, generalized IMPRESSION: This study is suggestive of moderate diffuse encephalopathy, nonspecific etiology but likely related to toxic-metabolic causes. No seizures or definite epileptiform discharges were seen throughout the recording. [name]   CT HEAD WO CONTRAST ( )  Result Date: 08/24/2022 CLINICAL DATA:  Neuro deficit, acute, stroke suspected New left sided weakness. EXAM: CT HEAD WITHOUT CONTRAST TECHNIQUE: Contiguous axial images were obtained from the base of the skull through the vertex without intravenous contrast. RADIATION DOSE REDUCTION: This exam was performed according to the departmental dose-optimization program which includes automated exposure control, adjustment of the mA and/or kV according to patient size and/or use of iterative reconstruction technique. COMPARISON:  08/18/2022 FINDINGS: Brain: There is questionable loss of gray-white differentiation in the high right parietal lobe on images 20 6-28. Cannot exclude acute infarct. The previously seen areas of abnormal punctate diffusion signal on prior MRI not appreciable by CT. There is atrophy and chronic small vessel disease changes. No hydrocephalus or hemorrhage. Vascular: No hyperdense vessel or unexpected calcification. Skull: No acute calvarial abnormality. Sinuses/Orbits: Mucosal thickening diffusely, most pronounced in the left frontal sinus,  stable. No air-fluid levels. Other: None IMPRESSION: Questionable area of loss of gray-white differentiation in the high right parietal lobe concerning for area of acute infarction. This could be further evaluated with MRI. Atrophy, chronic small vessel disease. Chronic sinusitis. Electronically Signed   By: Charlett Nose M.D.   On: 08/24/2022 18:06   DG CHEST PORT 1 VIEW  Result Date: 08/23/2022 CLINICAL DATA:  Central line care. EXAM: PORTABLE CHEST 1 VIEW COMPARISON:  08/22/2022. FINDINGS: The heart is enlarged and the mediastinal contour stable. There is a small left pleural effusion with atelectasis at the left lung base. No pneumothorax. The distal tip of the left internal jugular central venous catheter terminates over the right atrium. An enteric tube courses over the stomach and out of the field of view. An endotracheal tube terminates 5.3 cm above the carina. IMPRESSION: 1. Small left pleural effusion with atelectasis at the left lung base. 2. Left internal jugular central venous catheter terminates over the right atrium. 3. Remaining support apparatus as described above. Electronically Signed   By: Thornell Sartorius M.D.   On: 08/23/2022 03:54   DG Chest Port 1 View  Result Date: 08/22/2022 CLINICAL DATA:  Endotracheal tube. EXAM: PORTABLE CHEST 1 VIEW COMPARISON:  04/24/2022. FINDINGS: The heart size and mediastinal contours are within normal limits. Strandy atelectasis or infiltrate is present at the left lung base. There is a small left pleural effusion. The right lung  is clear. No pneumothorax bilaterally. An enteric tube courses over the upper abdomen and out of the field of view. The endotracheal tube terminates 4.8 cm above the carina. IMPRESSION: 1. Small left pleural effusion with atelectasis or infiltrate at the left lung base. 2. Support apparatus as described above. Electronically Signed   By: Thornell Sartorius M.D.   On: 08/22/2022 21:28   CT ABDOMEN PELVIS W CONTRAST  Addendum Date:  08/22/2022   ADDENDUM REPORT: 08/22/2022 16:51 ADDENDUM: Critical Value/emergent results were called by telephone at the time of interpretation on 08/22/2022 at 4:39 pm to provider PAULA GUENTHER , who verbally acknowledged these results. Electronically Signed   By: Genevive Bi M.D.   On: 08/22/2022 16:51   Result Date: 08/22/2022 CLINICAL DATA:  Bowel obstruction. EXAM: CT ABDOMEN AND PELVIS WITH CONTRAST TECHNIQUE: Multidetector CT imaging of the abdomen and pelvis was performed using the standard protocol following bolus administration of intravenous contrast. RADIATION DOSE REDUCTION: This exam was performed according to the departmental dose-optimization program which includes automated exposure control, adjustment of the mA and/or kV according to patient size and/or use of iterative reconstruction technique. CONTRAST:  75mL OMNIPAQUE IOHEXOL 350 MG/ML SOLN COMPARISON:  CT 08/19/2022 com plain film 08/22/2018 FINDINGS: Lower chest: Small bilateral pleural effusions. Hepatobiliary: No focal hepatic lesion. Normal gallbladder. No biliary duct dilatation. Common bile duct is normal. Pancreas: Pancreas is normal. No ductal dilatation. No pancreatic inflammation. Spleen: Normal spleen Adrenals/urinary tract: Adrenal glands normal. Severe hydronephrosis of the RIGHT kidney. Severe hydroureter on the RIGHT. Findings not changed from comparison exam. Hydroureter extends in the pelvis. LEFT kidney normal. Stomach/Bowel: NG tube in stomach. This fluid within the distal esophagus. The stomach is nondistended. Proximal small bowel is normal caliber. Again demonstrated marked distension of the colon. There is new intraperitoneal free air noted anterior to the liver (image 12/3). Scattered free air in the falciform ligament (image 14/3). Small amount of free air noted in the ventral peritoneal space of the mid abdomen. Minimal free fluid in the abdomen pelvis. No clear source of bowel perforation of presumably from the  colon. Vascular/Lymphatic: Abdominal aorta is normal caliber. No periportal or retroperitoneal adenopathy. No pelvic adenopathy. Reproductive: Calcified enlarged uterine fibroid. Complex mass in the RIGHT adnexa measuring 8.5 by 6.7 cm. Other: No free fluid. Musculoskeletal: No aggressive osseous lesion. IMPRESSION: 1. New intraperitoneal free air. Presumed perforation of the colon. No clear site of perforation identified. Minimal intraperitoneal free fluid. 2. Fluid within the esophagus presumably related to bowel obstruction. NG tube in stomach. 3. Proximal small bowel is nondistended. 4. Colon is markedly distended with air-fluid levels. 5. Severe hydronephrosis of the RIGHT kidney and RIGHT hydroureter. Potential obstructing RIGHT adnexal mass. Electronically Signed: By: Genevive Bi M.D. On: 08/22/2022 16:29   DG Abd 1 View  Result Date: 08/22/2022 CLINICAL DATA:  Bowel obstruction. EXAM: ABDOMEN - 1 VIEW COMPARISON:  Radiograph yesterday, CT 08/19/2022 FINDINGS: Gaseous bowel distension, colonic distention on recent CT, without significant interval change. Multiple ingested pills in the right abdomen. Calcified uterine fibroid as before. CT is pending at this time. IMPRESSION: Gaseous bowel distension, colonic distention on recent CT, without significant interval change. Electronically Signed   By: Narda Rutherford M.D.   On: 08/22/2022 15:58   DG Abd 1 View  Result Date: 08/22/2022 CLINICAL DATA:  Nasogastric tube placement.  Bowel obstruction. EXAM: ABDOMEN - 1 VIEW COMPARISON:  Earlier today FINDINGS: Tip and side port of the enteric tube below the diaphragm in  the stomach. Gaseous bowel distention in the upper abdomen. IMPRESSION: Tip and side port of the enteric tube below the diaphragm in the stomach. Electronically Signed   By: Narda Rutherford M.D.   On: 08/22/2022 15:57   VAS [place] TRANSCRANIAL DOPPLER  Result Date: 08/21/2022  Transcranial Doppler Patient Name:  Debra Sharp  Date of Exam:    08/21/2022 Medical Rec #: [id number]      Accession #:    [id number] Date of Birth: Oct 08, 1950      Patient Gender: F Patient Age:   55 years Exam Location:  [facility] Procedure:      VAS [place] TRANSCRANIAL DOPPLER Referring Phys: Osvaldo Shipper --------------------------------------------------------------------------------  Indications: Stroke. Comparison Study: No prior studies. Performing Technologist: Jean Rosenthal RDMS, RVT  Examination Guidelines: A complete evaluation includes B-mode imaging, spectral Doppler, color Doppler, and power Doppler as needed of all accessible portions of each vessel. Bilateral testing is considered an integral part of a complete examination. Limited examinations for reoccurring indications may be performed as noted.  +----------+---------------+----------+-----------+------------------+ RIGHT TCD Right VM (cm/s)Depth (cm)Pulsatility     Comment       +----------+---------------+----------+-----------+------------------+ MCA             79                    1.17                       +----------+---------------+----------+-----------+------------------+ ACA             -36                   1.13                       +----------+---------------+----------+-----------+------------------+ Term ICA        53                    1.11                       +----------+---------------+----------+-----------+------------------+ PCA P1          -15                   0.46                       +----------+---------------+----------+-----------+------------------+ Opthalmic       26                    1.81                       +----------+---------------+----------+-----------+------------------+ ICA siphon      63                    1.30                       +----------+---------------+----------+-----------+------------------+ Vertebral                                     Bidirectional flow  +----------+---------------+----------+-----------+------------------+  +----------+--------------+----------+-----------+------------------+ LEFT TCD  Left VM (cm/s)Depth (cm)Pulsatility     Comment       +----------+--------------+----------+-----------+------------------+ MCA            101  1.21                       +----------+--------------+----------+-----------+------------------+ ACA                                          Unable to insonate +----------+--------------+----------+-----------+------------------+ Term ICA        26                   1.45                       +----------+--------------+----------+-----------+------------------+ PCA P1          33                   1.04                       +----------+--------------+----------+-----------+------------------+ Opthalmic       23                   1.92                       +----------+--------------+----------+-----------+------------------+ ICA siphon      45                   1.34                       +----------+--------------+----------+-----------+------------------+ Vertebral      -32                   1.29                       +----------+--------------+----------+-----------+------------------+  +------------+-------+-------+             VM cm/sComment +------------+-------+-------+ Prox Basilar  -43          +------------+-------+-------+ Dist Basilar  -11          +------------+-------+-------+ Summary:  Elevated left middle cerebral artery mean flow velocities suggest mild stenosis.Mildy elevated right middle cerebral artery mean flow velcoities of unclear significance *See table(s) above for TCD measurements and observations.  Diagnosing physician: Delia Heady MD Electronically signed by Delia Heady MD on 08/21/2022 at 8:01:56 PM.    Final    VAS US CAROTID  Result Date: 08/21/2022 Carotid Arterial Duplex Study Patient Name:  OZELIA FRIESS  Date of  Exam:   08/21/2022 Medical Rec #: [id number]      Accession #:    [id number] Date of Birth: Nov 08, 1950      Patient Gender: F Patient Age:   6 years Exam Location:  [facility] Procedure:      VAS US CAROTID Referring Phys: Osvaldo Shipper --------------------------------------------------------------------------------  Indications:       CVA. Risk Factors:      Hypertension, hyperlipidemia. Comparison Study:  No prior studies. Performing Technologist: Jean Rosenthal RDMS, RVT  Examination Guidelines: A complete evaluation includes B-mode imaging, spectral Doppler, color Doppler, and power Doppler as needed of all accessible portions of each vessel. Bilateral testing is considered an integral part of a complete examination. Limited examinations for reoccurring indications may be performed as noted.  Right Carotid Findings: +----------+--------+--------+--------+------------------+---------+           PSV cm/sEDV cm/sStenosisPlaque DescriptionComments  +----------+--------+--------+--------+------------------+---------+  CCA Prox  185     18                                Turbulent +----------+--------+--------+--------+------------------+---------+ CCA Distal129     22                                          +----------+--------+--------+--------+------------------+---------+ ICA Prox  147     31      1-39%   calcific          tortuous  +----------+--------+--------+--------+------------------+---------+ ICA Distal118     27                                          +----------+--------+--------+--------+------------------+---------+ ECA       111     16                                          +----------+--------+--------+--------+------------------+---------+ +----------+--------+-------+--------+-------------------+           PSV cm/sEDV cmsDescribeArm Pressure (mmHG) +----------+--------+-------+--------+-------------------+ [id number]            Stenotic                     +----------+--------+-------+--------+-------------------+ +---------+--------+--+--------+---------------+ VertebralPSV cm/s21EDV cm/sBi- directional +---------+--------+--+--------+---------------+  Left Carotid Findings: +----------+--------+--------+--------+------------------+--------+           PSV cm/sEDV cm/sStenosisPlaque DescriptionComments +----------+--------+--------+--------+------------------+--------+ CCA Prox  67      16                                         +----------+--------+--------+--------+------------------+--------+ CCA Distal77      17                                         +----------+--------+--------+--------+------------------+--------+ ICA Prox  89      25      1-39%   calcific                   +----------+--------+--------+--------+------------------+--------+ ICA Distal95      31                                         +----------+--------+--------+--------+------------------+--------+ ECA       80                                                 +----------+--------+--------+--------+------------------+--------+ +----------+--------+--------+----------------+-------------------+           PSV cm/sEDV cm/sDescribe        Arm Pressure (mmHG) +----------+--------+--------+----------------+-------------------+ [id number]              Multiphasic, WNL                    +----------+--------+--------+----------------+-------------------+ +---------+--------+--+--------+--+---------+  VertebralPSV cm/s74EDV cm/s14Antegrade +---------+--------+--+--------+--+---------+   Summary: Right Carotid: Velocities in the right ICA are consistent with a 1-39% stenosis. Left Carotid: Velocities in the left ICA are consistent with a 1-39% stenosis. Vertebrals:  Left vertebral artery demonstrates antegrade flow. Right vertebral              artery demonstrates bidirectional flow. Subclavians: Right subclavian  artery was stenotic. Normal flow hemodynamics were              seen in the left subclavian artery. *See table(s) above for measurements and observations.  Electronically signed by Delia Heady MD on 08/21/2022 at 7:57:56 PM.    Final    DG Abd Portable 1V  Result Date: 08/21/2022 CLINICAL DATA:  [id number] Nausea and vomiting [id number] EXAM: PORTABLE ABDOMEN - 1 VIEW COMPARISON:  CT 08/19/2022 FINDINGS: Persistent dilated loops of colon. A few gas distended small bowel loops in the left mid abdomen. Calcified uterine fibroid. IMPRESSION: Persistent dilated colon suggesting distal obstruction. Electronically Signed   By: Corlis Leak M.D.   On: 08/21/2022 12:51   MR BRAIN W CONTRAST  Result Date: 08/20/2022 CLINICAL DATA:  Evaluate for metastatic disease. EXAM: MRI HEAD WITH CONTRAST TECHNIQUE: Multiplanar, multiecho pulse sequences of the brain and surrounding structures were obtained with intravenous contrast. CONTRAST:  7mL GADAVIST GADOBUTROL 1 MMOL/ML IV SOLN COMPARISON:  Noncontrast brain MRI from 2 days ago FINDINGS: Brain: Punctate enhancement in the right frontal parietal white matter on 5:38, associated with a focus of restricted diffusion. No generalized abnormal enhancement. Vascular: Major vessels are enhancing Skull and upper cervical spine: No focal marrow lesion. Sinuses/Orbits: Active bilateral sinusitis with maxillary fluid levels. Complete left frontal sinus opacification. Enhancing polypoid densities in the bilateral nasal cavity which are numerous. IMPRESSION: 1. Single punctate enhancement in the right frontal parietal white matter, a site of weakly restricted diffusion and compatible with subacute ischemia. If high concern for metastatic disease follow-up in 12 weeks could prove resolution. 2. Active sinusitis with nasal cavity polyps. Electronically Signed   By: Tiburcio Pea M.D.   On: 08/20/2022 12:01   CT ABDOMEN PELVIS W CONTRAST  Result Date: 08/19/2022 CLINICAL DATA:  Hydronephrosis,  abnormal gallbladder on ultrasound abdomen distension EXAM: CT ABDOMEN AND PELVIS WITH CONTRAST TECHNIQUE: Multidetector CT imaging of the abdomen and pelvis was performed using the standard protocol following bolus administration of intravenous contrast. RADIATION DOSE REDUCTION: This exam was performed according to the departmental dose-optimization program which includes automated exposure control, adjustment of the mA and/or kV according to patient size and/or use of iterative reconstruction technique. CONTRAST:  75mL OMNIPAQUE IOHEXOL 350 MG/ML SOLN COMPARISON:  Ultrasound 08/18/2022 FINDINGS: Lower chest: Lung bases demonstrate no acute airspace disease. Mild coronary vascular calcification. Borderline cardiomegaly. Small hiatal hernia Hepatobiliary: No calcified gallstone or biliary dilatation. No gas within the gallbladder lumen or gallbladder wall. Pancreas: Unremarkable. No pancreatic ductal dilatation or surrounding inflammatory changes. Spleen: Normal in size without focal abnormality. Adrenals/Urinary Tract: Adrenal glands are within normal limits. Severe right-sided hydronephrosis and hydroureter with dilated ureter visualized to the pelvis. No obstructing stone is seen. No significant excretion of contrast right kidney consistent with decreased renal function and obstruction. Decompressed urinary bladder Stomach/Bowel: Stomach nonenlarged. No dilated small bowel. Fluid and air distension of the colon, measuring up to 9.3 cm maximum at the right colon. Decompressed rectum with small volume stool. Unopacified bowel in the pelvis. Poorly identified sigmoid colon with suspicion of possible sigmoid colon mass in the right pelvis, series  3, image 70 but limited due to absence of enteral contrast. Vascular/Lymphatic: Moderate aortic atherosclerosis. No aneurysm. No suspicious lymph nodes. Reproductive: Enlarged uterus with large calcified uterine mass measuring 9 by 8.7 cm consistent with large calcified  fibroid. As mentioned previously, ill-defined hypodense area in the right hemipelvis. Other: Negative for free air.  Trace volume fluid in the pelvis Musculoskeletal: Grade 1 anterolisthesis L4 on L5. No acute osseous abnormality IMPRESSION: 1. Marked fluid and air distension of the majority of colon with decompressed rectum and poorly delineated sigmoid colon. Ill-defined hypodensity/suspected mass within the right pelvis with central gas collection. Findings are suspect for colon obstruction, potentially due to sigmoid colon mass, suggest correlation with colonoscopy. There is additional finding of severe right-sided hydronephrosis and hydroureter with dilated ureter visualized to the level of right pelvic ill-defined hypodensity, consistent with distal ureteral obstruction by right pelvic mass. Other consideration for the right pelvic mass could include gynecologic malignancy. Trace free fluid in the pelvis. No free air. 2. Negative for gallstones or air within the gallbladder as questioned on prior ultrasound 3. Enlarged uterus with large calcified uterine mass consistent with large calcified fibroid. 4. Small hiatal hernia. 5. Aortic atherosclerosis. Aortic Atherosclerosis (ICD10-I70.0). These results will be called to the ordering clinician or representative by the Radiologist Assistant, and communication documented in the PACS or [employer]. Electronically Signed   By: Jasmine Pang M.D.   On: 08/19/2022 17:50   ECHOCARDIOGRAM LIMITED  Result Date: 08/19/2022    ECHOCARDIOGRAM LIMITED REPORT   Patient Name:   Debra Sharp Date of Exam: 08/19/2022 Medical Rec #:  [id number]     Height:       66.0 in Accession #:    [id number]    Weight:       170.0 lb Date of Birth:  [birth year]     BSA:          1.866 m Patient Age:    72 years      BP:           133/68 mmHg Patient Gender: F             HR:           80 bpm. Exam Location:  Inpatient Procedure: Limited Echo and Color Doppler Indications:     stroke/pericardial effusion  History:        Patient has prior history of Echocardiogram examinations, most                 recent 04/25/2022. CHF, COPD; Risk Factors:Hypertension and Former                 Smoker.  Sonographer:    Melissa Morford RDCS (AE, PE) Referring Phys: [id number] Osvaldo Shipper IMPRESSIONS  1. Left ventricular ejection fraction, by estimation, is 40 to 45%. The left ventricle has mildly decreased function. The left ventricle demonstrates global hypokinesis.  2. Right ventricular systolic function is normal. The right ventricular size is normal.  3. The mitral valve is normal in structure. Mild to moderate mitral valve regurgitation.  4. The aortic valve is tricuspid. Aortic valve regurgitation is not visualized.  5. Trivial pericardial effusion  6. The inferior vena cava is normal in size with greater than 50% respiratory variability, suggesting right atrial pressure of 3 mmHg. FINDINGS  Left Ventricle: Left ventricular ejection fraction, by estimation, is 40 to 45%. The left ventricle has mildly decreased function. The left ventricle demonstrates global hypokinesis. Right Ventricle: The right ventricular  size is normal. No increase in right ventricular wall thickness. Right ventricular systolic function is normal. Pericardium: Trivial pericardial effusion is present. Mitral Valve: The mitral valve is normal in structure. Mild to moderate mitral valve regurgitation. Tricuspid Valve: The tricuspid valve is normal in structure. Tricuspid valve regurgitation is trivial. Aortic Valve: The aortic valve is tricuspid. Aortic valve regurgitation is not visualized. Pulmonic Valve: The pulmonic valve was not well visualized. Pulmonic valve regurgitation is not visualized. Venous: The inferior vena cava is normal in size with greater than 50% respiratory variability, suggesting right atrial pressure of 3 mmHg. IAS/Shunts: The interatrial septum was not well visualized. Epifanio Lesches MD Electronically  signed by Epifanio Lesches MD Signature Date/Time: 08/19/2022/1:35:21 PM    Final    MR BRAIN WO CONTRAST  Result Date: 08/18/2022 CLINICAL DATA:  Follow-up examination for stroke. EXAM: MRI HEAD WITHOUT CONTRAST TECHNIQUE: Multiplanar, multiecho pulse sequences of the brain and surrounding structures were obtained without intravenous contrast. COMPARISON:  CT from earlier the same day. FINDINGS: Brain: Cerebral volume within normal limits. Scattered patchy T2/FLAIR hyperintensity involving the periventricular deep white matter both cerebral hemispheres, consistent with chronic small vessel ischemic disease, mild-to-moderate in nature. Few scattered punctate foci of diffusion signal abnormality are seen involving the right parietal lobe (series 5, image 87), left frontal centrum semi ovale (series 7, image 64) and right cerebellum (series 5, image 64). Findings suspicious for tiny acute ischemic infarcts. No associated hemorrhage or mass effect. No other evidence for acute or subacute ischemia. Gray-white matter differentiation otherwise maintained. No acute or chronic intracranial blood products. No mass lesion, midline shift or mass effect. No hydrocephalus or extra-axial fluid collection. Pituitary gland and suprasellar region within normal limits. Vascular: Major intracranial vascular flow voids are maintained. Skull and upper cervical spine: Craniocervical junction within normal limits. Diffuse loss of normal bone marrow signal, nonspecific but can be seen with anemia, smoking, obesity, and infiltrative/myelofibrotic marrow processes. No focal marrow replacing lesion. Sinuses/Orbits: Prior bilateral ocular lens replacement. Scattered mucosal thickening and secretions noted about the frontoethmoidal, sphenoid, and maxillary sinuses. Scattered superimposed air-fluid levels. No mastoid effusion. Other: None. IMPRESSION: 1. Few scattered punctate foci of diffusion signal abnormality involving the right  parietal lobe, left frontal centrum semi ovale, and right cerebellum, suspicious for tiny acute ischemic infarcts. No associated hemorrhage or mass effect. 2. Underlying mild to moderate chronic microvascular ischemic disease. Electronically Signed   By: Rise Mu M.D.   On: 08/18/2022 20:24   US Abdomen Limited RUQ (LIVER/GB)  Result Date: 08/18/2022 CLINICAL DATA:  Cirrhosis evaluation EXAM: ULTRASOUND ABDOMEN LIMITED RIGHT UPPER QUADRANT COMPARISON:  None Available. FINDINGS: Gallbladder: Probable small amount of gallbladder sludge. Few echogenic foci with possible dirty shadowing. Negative sonographic Murphy. Common bile duct: Diameter: 3.4 mm Liver: Slightly coarse hepatic echotexture. No focal hepatic abnormality. Portal vein is patent on color Doppler imaging with normal direction of blood flow towards the liver. Other: Incidentally noted is moderate severe right hydronephrosis. Cortical thinning of right kidney IMPRESSION: 1. Slightly coarse hepatic echotexture without focal lesion. Possible subtle surface nodularity on some of the views as may be seen with early changes of cirrhosis. 2. Distended gallbladder, appears to contain sludge and echogenic foci some of which exhibit dirty shadowing, question air containing stones. No wall thickening or Murphy. Suggest correlation with CT to exclude air within the gallbladder. 3. Moderate to severe right hydronephrosis with cortical thinning of right kidney. This may also be assessed at CT follow-up. These results will be  called to the ordering clinician or representative by the Radiologist Assistant, and communication documented in the PACS or [employer]. Electronically Signed   By: Jasmine Pang M.D.   On: 08/18/2022 19:42   CT Head Wo Contrast  Result Date: 08/18/2022 CLINICAL DATA:  Weakness since March, worsening over the last week. Decreased mobility. EXAM: CT HEAD WITHOUT CONTRAST TECHNIQUE: Contiguous axial images were obtained from  the base of the skull through the vertex without intravenous contrast. RADIATION DOSE REDUCTION: This exam was performed according to the departmental dose-optimization program which includes automated exposure control, adjustment of the mA and/or kV according to patient size and/or use of iterative reconstruction technique. COMPARISON:  None Available. FINDINGS: Brain: There is no acute intracranial hemorrhage, extra-axial fluid collection, or acute territorial infarct. Parenchymal volume is normal for age. The ventricles are normal in size. Gray-white differentiation is preserved. There is mild background chronic small-vessel ischemic change. There an age-indeterminate but favored remote small infarct in the genu of the corpus callosum. The pituitary and suprasellar region are normal. There is no mass lesion. There is no mass effect or midline shift. Vascular: No hyperdense vessel or unexpected calcification. Skull: Normal. Negative for fracture or focal lesion. Sinuses/Orbits: There is extensive chronic pansinusitis with layering fluid in the maxillary and sphenoid sinuses. Bilateral lens implants are in place. The globes and orbits are otherwise unremarkable. Other: The mastoid air cells and middle ear cavities are clear. IMPRESSION: 1. 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 No other evidence of acute intracranial pathology. 2. Extensive chronic pansinusitis with possible superimposed acute sinusitis. Electronically Signed   By: Lesia Hausen M.D.   On: 08/18/2022 12:12       The results of significant diagnostics from this hospitalization (including imaging, microbiology, ancillary and laboratory) are listed below for reference.     Microbiology: Recent Results (from the past 240 hour(s))  Urine Culture (for pregnant, neutropenic or urologic patients or patients with an indwelling urinary catheter)     Status: None   Collection Time: 09/13/22 11:06 AM    Specimen: Urine, Catheterized  Result Value Ref Range Status   Specimen Description URINE, CATHETERIZED  Final   Special Requests NONE  Final   Culture   Final    NO GROWTH Performed at [facility], [address] [place], [place] [address]    Report Status 09/14/2022 FINAL  Final     Labs:  CBC: Recent Labs  Lab 09/12/22 0455 09/12/22 1244 09/13/22 0327 09/14/22 0300 09/15/22 0530 09/16/22 0515  WBC 12.3*  --  14.7* 13.8* 11.1* 13.9*  HGB 7.1* 7.9* 7.8* 8.0* 7.0* 7.5*  HCT 24.2* 26.9* 26.3* 26.9* 23.1* 24.5*  MCV 86.1  --  84.6 86.5 84.0 86.0  PLT 978*  --  973* 951* 754* 780*   BMP &GFR Recent Labs  Lab 09/12/22 1244 09/13/22 0327 09/14/22 0300 09/14/22 1735 09/15/22 0530 09/15/22 1718 09/16/22 0515  NA 140 138 143  --  139  --  138  K 3.9 3.4* 3.7  --  3.2*  --  4.7  CL 119* 115* 116*  --  111  --  110  CO2 14* 15* 16*  --  21*  --  20*  GLUCOSE 109* 119* 86  --  112*  --  109*  BUN 55* 56* 58*  --  58*  --  65*  CREATININE 1.87* 1.95* 1.98*  --  1.81*  --  1.90*  CALCIUM 10.3 10.2 10.0  --  8.6*  --  8.3*  MG  --   --   --  1.9 1.6* 2.1 2.0  PHOS  --   --   --  3.8 3.0 2.8 2.3*   Estimated Creatinine Clearance: 29.1 mL/min (A) (by C-G formula based on SCr of 1.9 mg/dL (H)). Liver & Pancreas: Recent Labs  Lab 09/12/22 0455 09/13/22 0327 09/14/22 0300  AST 12* 14* 13*  ALT 12 13 13   ALKPHOS 49 57 59  BILITOT 0.4 0.6 0.5  PROT 4.8* 5.1* 5.0*  ALBUMIN 1.6* 1.8* 1.8*   No results for input(s): "LIPASE", "AMYLASE" in the last 168 hours. No results for input(s): "AMMONIA" in the last 168 hours. Diabetic: No results for input(s): "HGBA1C" in the last 72 hours. Recent Labs  Lab 09/15/22 1557 09/15/22 2025 09/16/22 0008 09/16/22 0335 09/16/22 0822  GLUCAP 114* 121* 124* 96 100*   Cardiac Enzymes: No results for input(s): "CKTOTAL", "CKMB", "CKMBINDEX", "TROPONINI" in the last 168 hours. No results for input(s): "PROBNP" in the last 8760  hours. Coagulation Profile: No results for input(s): "INR", "PROTIME" in the last 168 hours. Thyroid Function Tests: No results for input(s): "TSH", "T4TOTAL", "FREET4", "T3FREE", "THYROIDAB" in the last 72 hours. Lipid Profile: No results for input(s): "CHOL", "HDL", "LDLCALC", "TRIG", "CHOLHDL", "LDLDIRECT" in the last 72 hours. Anemia Panel: No results for input(s): "VITAMINB12", "FOLATE", "FERRITIN", "TIBC", "IRON", "RETICCTPCT" in the last 72 hours. Urine analysis:    Component Value Date/Time   COLORURINE YELLOW 09/13/2022 1615   APPEARANCEUR HAZY (A) 09/13/2022 1615   LABSPEC 1.020 09/13/2022 1615   PHURINE 5.0 09/13/2022 1615   GLUCOSEU NEGATIVE 09/13/2022 1615   HGBUR SMALL (A) 09/13/2022 1615   HGBUR negative 11/30/2008 1120   BILIRUBINUR NEGATIVE 09/13/2022 1615   KETONESUR NEGATIVE 09/13/2022 1615   PROTEINUR 100 (A) 09/13/2022 1615   UROBILINOGEN 1.0 10/09/2009 2224   NITRITE NEGATIVE 09/13/2022 1615   LEUKOCYTESUR LARGE (A) 09/13/2022 1615   Sepsis Labs: Invalid input(s): "PROCALCITONIN", "LACTICIDVEN"   SIGNED:  Almon Hercules, MD  [facility] 09/16/2022, 11:07 AM

## 2022-09-16 NOTE — Plan of Care (Addendum)
IR following the patient for RLQ drain.   Patient has fluctuating leukocytosis, per ID pt will be on meropenem till end of August.  Output from RLQ drain has been low for two days.   Patient's RF has been fluctuating, GFR this morning 28.  Discussed with CT and patient w/ GFR less than 30 cannot receive IV contrast.   Discussed with Dr. Grace Isaac, will plan for drain injection and possible removal as recent CT on 7/26 showed near resolution of the RLQ fluid collection.  Patient is allergic to IV contrast, needs 13 hrs prep. Will schedule the procedure for tomorrow 10 am.  13 hr prep ordered.   Continue with TID flushing with 5 mL, measure/document output q shift.   Please call IR for questions and concerns.   Addendum: Patient is being discharged today.  Will arrange drain f/u next week.  Patient will need 13 hr allergy prep, will send prescription once drain injection is scheduled.   Lynann Bologna Sherryl Valido PA-C 09/16/2022 2:56 PM

## 2022-09-16 NOTE — Progress Notes (Signed)
   25 Days Post-Op Subjective: NAEON. Pt alert and responsive. Son was at bedside. Both parties updated and questions were answered to their satisfaction.   Objective: Vital signs in last 24 hours: Temp:  [98 F (36.7 C)-98.3 F (36.8 C)] 98 F (36.7 C) (07/31 0757) Pulse Rate:  [76-84] 81 (07/31 0757) Resp:  [11-20] 18 (07/31 0828) BP: (134-143)/(50-59) 143/55 (07/31 0757) SpO2:  [98 %-100 %] 99 % (07/31 0757)  Intake/Output from previous day: 07/30 0701 - 07/31 0700 In: 2952.6 [I.V.:1572.6; NG/GT:1120; IV Piggyback:250.1] Out: 2025 [Urine:725; Drains:25; Stool:1275]  Intake/Output this shift: No intake/output data recorded.  Physical Exam:  General: Alert and oriented CV: No cyanosis Lungs: equal chest rise Gu: Pure wick catheter  Lab Results: Recent Labs    09/14/22 0300 09/15/22 0530 09/16/22 0515  HGB 8.0* 7.0* 7.5*  HCT 26.9* 23.1* 24.5*   BMET Recent Labs    09/15/22 0530 09/16/22 0515  NA 139 138  K 3.2* 4.7  CL 111 110  CO2 21* 20*  GLUCOSE 112* 109*  BUN 58* 65*  CREATININE 1.81* 1.90*  CALCIUM 8.6* 8.3*     Studies/Results: No results found.  Assessment/Plan: # Severe right hydronephrosis 2/2 abdominal mass # Terminal ileum adenocarcinoma w/ peritoneal metastasis.  #AKI  Scr has continued to elevate.   S/p bowel perf and hemicolectomy.   Repeat renal ultrasound was collected 7/11.  Right side hydronephrosis was unchanged.  No plan for further abd surgery for many months. R. PCNT 55f placed 7/15 to preserve kidney  Hematuria from R. PCNT. BID Eliquis in treatment of acute PE. I doubt that she can come off of anticoagulation in her hypercoagulable state, but if there is a safe window, she would benefit from enough of a holiday to allow her bleeding to at least lessen, if not stop. Hematuria in neph tube is much improved today. No clot material and medium red clear.   Urology will continue to follow along peripherally    LOS: 29 days    Elmon Kirschner, NP [facility] Pager: [phone]  09/16/2022, 11:10 AM

## 2022-09-16 NOTE — Progress Notes (Signed)
Pharmacy Antibiotic Note  Debra Sharp is a 72 y.o. female admitted on 08/18/2022 with  pseudomonal intraabdominal polymicrobial abscess . Dr Jarvis Newcomer and team is concerned about patient's confusion for no obvious organic cause and would like to switch from cefepime/flagyl to alternative. Pharmacy has been consulted for meropenem dosing.   Patient remains afebrile. WBC is 13.9 (up). Meropenem dosing based on CrCl of 29.1 mL/min.  Plan: Continue meropenem 1g IV q12h Monitor renal function, CBC, and signs/symptoms of an infection  Height: 5' 5.98" (167.6 cm) Weight:  (unable to take wt.bed wt not working) IBW/kg (Calculated) : 59.26  Temp (24hrs), Avg:98.1 F (36.7 C), Min:98 F (36.7 C), Max:98.3 F (36.8 C)  Recent Labs  Lab 09/12/22 0455 09/12/22 1244 09/13/22 0327 09/14/22 0300 09/15/22 0530 09/16/22 0515  WBC 12.3*  --  14.7* 13.8* 11.1* 13.9*  CREATININE 1.78* 1.87* 1.95* 1.98* 1.81* 1.90*    Estimated Creatinine Clearance: 29.1 mL/min (A) (by C-G formula based on SCr of 1.9 mg/dL (H)).    Allergies  Allergen Reactions   Iodinated Contrast Media Shortness Of Breath and Itching    Resolved with benadryl and solu-mederol. Consider premedication prior to future studies.   Aspirin Other (See Comments)    Caused an asthma attack   Ibuprofen Swelling and Other (See Comments)    Angioedema and Asthma exacerbation   Sulfonamide Derivatives Swelling and Other (See Comments)    Face swelled SEVERELY   Penicillins Rash    Antimicrobials this admission: Cefepime 7/6 > 7/28 Flagyl 7/6 > 7/28 Meropenem 7/28 >>  Microbiology results: 7/28 Ucx: neg  7/20 fluid>> few pseudomonas (panS) & moderate streptococcus anginosis (PanS except erythro) 7/15 Ucx: NgF 7/6 BCx - negativeF 7/6 MRSA PCR - negative  Thank you for involving pharmacy in this patient's care.  Loura Back, PharmD, BCPS Clinical Pharmacist Clinical phone for 09/16/2022 is [phone] 09/16/2022 9:57 AM

## 2022-09-16 NOTE — Plan of Care (Signed)
  Problem: Health Behavior/Discharge Planning: Goal: Ability to manage health-related needs will improve Outcome: Progressing   Problem: Clinical Measurements: Goal: Ability to maintain clinical measurements within normal limits will improve Outcome: Progressing Goal: Will remain free from infection Outcome: Progressing Goal: Diagnostic test results will improve Outcome: Progressing Goal: Respiratory complications will improve Outcome: Progressing Goal: Cardiovascular complication will be avoided Outcome: Progressing   Problem: Activity: Goal: Risk for activity intolerance will decrease Outcome: Progressing   Problem: Nutrition: Goal: Adequate nutrition will be maintained Outcome: Progressing   Problem: Coping: Goal: Level of anxiety will decrease Outcome: Progressing   Problem: Elimination: Goal: Will not experience complications related to bowel motility Outcome: Progressing Goal: Will not experience complications related to urinary retention Outcome: Progressing   Problem: Pain Managment: Goal: General experience of comfort will improve Outcome: Progressing   Problem: Safety: Goal: Ability to remain free from injury will improve Outcome: Progressing   Problem: Skin Integrity: Goal: Risk for impaired skin integrity will decrease Outcome: Progressing   Problem: Education: Goal: Ability to describe self-care measures that may prevent or decrease complications (Diabetes Survival Skills Education) will improve Outcome: Progressing Goal: Individualized Educational Video(s) Outcome: Progressing   Problem: Coping: Goal: Ability to adjust to condition or change in health will improve Outcome: Progressing   Problem: Fluid Volume: Goal: Ability to maintain a balanced intake and output will improve Outcome: Progressing   Problem: Health Behavior/Discharge Planning: Goal: Ability to identify and utilize available resources and services will improve Outcome:  Progressing Goal: Ability to manage health-related needs will improve Outcome: Progressing   Problem: Metabolic: Goal: Ability to maintain appropriate glucose levels will improve Outcome: Progressing   Problem: Nutritional: Goal: Maintenance of adequate nutrition will improve Outcome: Progressing Goal: Progress toward achieving an optimal weight will improve Outcome: Progressing   Problem: Skin Integrity: Goal: Risk for impaired skin integrity will decrease Outcome: Progressing   Problem: Tissue Perfusion: Goal: Adequacy of tissue perfusion will improve Outcome: Progressing   Problem: Education: Goal: Knowledge of disease or condition will improve Outcome: Progressing Goal: Knowledge of secondary prevention will improve (MUST DOCUMENT ALL) Outcome: Progressing Goal: Knowledge of patient specific risk factors will improve Loraine Leriche N/A or DELETE if not current risk factor) Outcome: Progressing   Problem: Ischemic Stroke/TIA Tissue Perfusion: Goal: Complications of ischemic stroke/TIA will be minimized Outcome: Progressing   Problem: Coping: Goal: Will verbalize positive feelings about self Outcome: Progressing Goal: Will identify appropriate support needs Outcome: Progressing   Problem: Health Behavior/Discharge Planning: Goal: Ability to manage health-related needs will improve Outcome: Progressing Goal: Goals will be collaboratively established with patient/family Outcome: Progressing   Problem: Self-Care: Goal: Ability to participate in self-care as condition permits will improve Outcome: Progressing Goal: Verbalization of feelings and concerns over difficulty with self-care will improve Outcome: Progressing Goal: Ability to communicate needs accurately will improve Outcome: Progressing   Problem: Nutrition: Goal: Risk of aspiration will decrease Outcome: Progressing Goal: Dietary intake will improve Outcome: Progressing

## 2022-09-17 LAB — POTASSIUM: Potassium: 5.7 mmol/L — ABNORMAL HIGH (ref 3.5–5.1)

## 2022-09-18 ENCOUNTER — Other Ambulatory Visit (HOSPITAL_COMMUNITY): Payer: Medicare Other

## 2022-09-19 LAB — POTASSIUM: Potassium: 6.2 mmol/L — ABNORMAL HIGH (ref 3.5–5.1)

## 2022-09-19 LAB — BASIC METABOLIC PANEL WITH GFR
Anion gap: 8 (ref 5–15)
BUN: 75 mg/dL — ABNORMAL HIGH (ref 8–23)
CO2: 18 mmol/L — ABNORMAL LOW (ref 22–32)
Calcium: 9 mg/dL (ref 8.9–10.3)
Chloride: 112 mmol/L — ABNORMAL HIGH (ref 98–111)
Creatinine, Ser: 2.35 mg/dL — ABNORMAL HIGH (ref 0.44–1.00)
GFR, Estimated: 22 mL/min — ABNORMAL LOW
Glucose, Bld: 94 mg/dL (ref 70–99)
Potassium: 5.9 mmol/L — ABNORMAL HIGH (ref 3.5–5.1)
Sodium: 138 mmol/L (ref 135–145)

## 2022-09-20 LAB — BASIC METABOLIC PANEL WITH GFR
Anion gap: 9 (ref 5–15)
BUN: 67 mg/dL — ABNORMAL HIGH (ref 8–23)
CO2: 19 mmol/L — ABNORMAL LOW (ref 22–32)
Calcium: 8.8 mg/dL — ABNORMAL LOW (ref 8.9–10.3)
Chloride: 111 mmol/L (ref 98–111)
Creatinine, Ser: 2.29 mg/dL — ABNORMAL HIGH (ref 0.44–1.00)
GFR, Estimated: 22 mL/min — ABNORMAL LOW
Glucose, Bld: 109 mg/dL — ABNORMAL HIGH (ref 70–99)
Potassium: 5.6 mmol/L — ABNORMAL HIGH (ref 3.5–5.1)
Sodium: 139 mmol/L (ref 135–145)

## 2022-09-21 LAB — CBC
HCT: 31.2 % — ABNORMAL LOW (ref 36.0–46.0)
Hemoglobin: 9.3 g/dL — ABNORMAL LOW (ref 12.0–15.0)
MCH: 25.2 pg — ABNORMAL LOW (ref 26.0–34.0)
MCHC: 29.8 g/dL — ABNORMAL LOW (ref 30.0–36.0)
MCV: 84.6 fL (ref 80.0–100.0)
Platelets: 662 10*3/uL — ABNORMAL HIGH (ref 150–400)
RBC: 3.69 MIL/uL — ABNORMAL LOW (ref 3.87–5.11)
RDW: 25.5 % — ABNORMAL HIGH (ref 11.5–15.5)
WBC: 13.2 10*3/uL — ABNORMAL HIGH (ref 4.0–10.5)
nRBC: 0 % (ref 0.0–0.2)

## 2022-09-24 LAB — PHOSPHORUS: Phosphorus: 6.3 mg/dL — ABNORMAL HIGH (ref 2.5–4.6)

## 2022-09-24 LAB — CBC
HCT: 25.1 % — ABNORMAL LOW (ref 36.0–46.0)
Hemoglobin: 7.6 g/dL — ABNORMAL LOW (ref 12.0–15.0)
MCH: 26.2 pg (ref 26.0–34.0)
MCHC: 30.3 g/dL (ref 30.0–36.0)
MCV: 86.6 fL (ref 80.0–100.0)
Platelets: 626 10*3/uL — ABNORMAL HIGH (ref 150–400)
RBC: 2.9 MIL/uL — ABNORMAL LOW (ref 3.87–5.11)
RDW: 24.6 % — ABNORMAL HIGH (ref 11.5–15.5)
WBC: 8.2 10*3/uL (ref 4.0–10.5)
nRBC: 0 % (ref 0.0–0.2)

## 2022-09-24 LAB — COMPREHENSIVE METABOLIC PANEL
ALT: 12 U/L (ref 0–44)
AST: 20 U/L (ref 15–41)
Albumin: 1.7 g/dL — ABNORMAL LOW (ref 3.5–5.0)
Alkaline Phosphatase: 71 U/L (ref 38–126)
Anion gap: 8 (ref 5–15)
BUN: 32 mg/dL — ABNORMAL HIGH (ref 8–23)
CO2: 19 mmol/L — ABNORMAL LOW (ref 22–32)
Calcium: 7.8 mg/dL — ABNORMAL LOW (ref 8.9–10.3)
Chloride: 112 mmol/L — ABNORMAL HIGH (ref 98–111)
Creatinine, Ser: 1.36 mg/dL — ABNORMAL HIGH (ref 0.44–1.00)
GFR, Estimated: 42 mL/min — ABNORMAL LOW (ref >60)
Glucose, Bld: 98 mg/dL (ref 70–99)
Potassium: 4.1 mmol/L (ref 3.5–5.1)
Sodium: 139 mmol/L (ref 135–145)
Total Bilirubin: 0.4 mg/dL (ref 0.3–1.2)
Total Protein: 5 g/dL — ABNORMAL LOW (ref 6.5–8.1)

## 2022-09-24 LAB — MAGNESIUM: Magnesium: 1.3 mg/dL — ABNORMAL LOW (ref 1.7–2.4)

## 2022-09-24 NOTE — Progress Notes (Signed)
I spoke with Ms Divincenzo and her son [name].  She is being d/c from [facility] tomorrow.  The would like to reschedule her appt for 3-4 weeks.  [name] will call me back to reschedule this appt.  He has my direct callback number.  All questions were answered.  He verbalized understanding.

## 2022-09-25 ENCOUNTER — Other Ambulatory Visit (HOSPITAL_COMMUNITY): Payer: Medicare Other

## 2022-09-25 LAB — CBC
HCT: 27.1 % — ABNORMAL LOW (ref 36.0–46.0)
Hemoglobin: 8.2 g/dL — ABNORMAL LOW (ref 12.0–15.0)
MCH: 25.4 pg — ABNORMAL LOW (ref 26.0–34.0)
MCHC: 30.3 g/dL (ref 30.0–36.0)
MCV: 83.9 fL (ref 80.0–100.0)
Platelets: 695 10*3/uL — ABNORMAL HIGH (ref 150–400)
RBC: 3.23 MIL/uL — ABNORMAL LOW (ref 3.87–5.11)
RDW: 24.4 % — ABNORMAL HIGH (ref 11.5–15.5)
WBC: 14.2 10*3/uL — ABNORMAL HIGH (ref 4.0–10.5)
nRBC: 0 % (ref 0.0–0.2)

## 2022-09-27 ENCOUNTER — Emergency Department (HOSPITAL_COMMUNITY)
Admission: EM | Admit: 2022-09-27 | Discharge: 2022-09-27 | Disposition: A | Discharge status: Home / Self Care | Payer: Medicare Other | Attending: Emergency Medicine | Admitting: Emergency Medicine

## 2022-09-27 ENCOUNTER — Encounter (HOSPITAL_COMMUNITY): Payer: Self-pay | Admitting: *Deleted

## 2022-09-27 DIAGNOSIS — Z7901 Long term (current) use of anticoagulants: Secondary | ICD-10-CM | POA: Diagnosis not present

## 2022-09-27 DIAGNOSIS — Z433 Encounter for attention to colostomy: Secondary | ICD-10-CM | POA: Insufficient documentation

## 2022-09-27 MED ORDER — TRAMADOL HCL 50 MG PO TABS
50.0000 mg | ORAL_TABLET | Freq: Once | ORAL | Status: AC
  Administered 2022-09-27: 50 mg via ORAL
  Filled 2022-09-27: qty 1

## 2022-09-27 NOTE — ED Notes (Signed)
Pt is a&ox4, pwd. Pt denies any complaints. She is in the bed with side rails up x 2, call light within reach, husband at the bedside.

## 2022-09-27 NOTE — ED Provider Notes (Signed)
[place] EMERGENCY DEPARTMENT AT [facility] Provider Note   CSN: [id number] Arrival date & time: 09/27/22  0125     History  No chief complaint on file.   Debra Sharp is a 72 y.o. female.  72 year old female presents with concern for need for colostomy supplies.  Patient reports that her bag ruptured.  She is otherwise feeling well without emergent complaint.       Home Medications Prior to Admission medications   Medication Sig Start Date End Date Taking? Authorizing Provider  acetaminophen (TYLENOL) 160 MG/5ML solution Place 20.3 mLs (650 mg total) into feeding tube every 6 (six) hours. 09/16/22   Almon Hercules, MD  albuterol (PROVENTIL) (2.5 MG/3ML) 0.083% nebulizer solution Inhale 3 mLs (2.5 mg total) into the lungs every 4 (four) hours as needed for wheezing or shortness of breath. 09/16/22   Almon Hercules, MD  apixaban (ELIQUIS) 5 MG TABS tablet Place 1 tablet (5 mg total) into feeding tube 2 (two) times daily. 09/16/22   Almon Hercules, MD  arformoterol (BROVANA) 15 MCG/2ML NEBU Take 2 mLs (15 mcg total) by nebulization 2 (two) times daily. 09/16/22   Almon Hercules, MD  ascorbic acid (VITAMIN C) 500 MG tablet Place 1 tablet (500 mg total) into feeding tube daily. 09/17/22   Almon Hercules, MD  atorvastatin (LIPITOR) 20 MG tablet Place 1 tablet (20 mg total) into feeding tube daily. 09/16/22   Almon Hercules, MD  budesonide (PULMICORT) 0.5 MG/2ML nebulizer solution Take 2 mLs (0.5 mg total) by nebulization 2 (two) times daily. 09/16/22   Almon Hercules, MD  Chlorhexidine Gluconate Cloth 2 % PADS Apply 6 each topically daily. 09/17/22   Almon Hercules, MD  diphenoxylate-atropine (LOMOTIL) 2.5-0.025 MG tablet Place 1 tablet into feeding tube 2 (two) times daily. 09/16/22   Almon Hercules, MD  ferrous sulfate 300 (60 Fe) MG/5ML syrup Place 5 mLs (300 mg total) into feeding tube 2 (two) times daily. 09/16/22   Almon Hercules, MD  hydrALAZINE (APRESOLINE) 20 MG/ML injection Inject 0.5  mLs (10 mg total) into the vein every 4 (four) hours as needed (SBP goal<180). 09/16/22   Almon Hercules, MD  loperamide HCl (IMODIUM) 1 MG/7.5ML suspension Place 15 mLs (2 mg total) into feeding tube 2 (two) times daily. 09/16/22   Almon Hercules, MD  melatonin 10 MG TABS Place 10 mg into feeding tube at bedtime as needed. 09/16/22   Almon Hercules, MD  meropenem 1 g in sodium chloride 0.9 % 100 mL Inject 1 g into the vein every 12 (twelve) hours for 28 days. Stop date 10/14/22 09/16/22 10/14/22  Rutha Bouchard T, MD  Morphine Sulfate (MORPHINE, PF,) 2 MG/ML injection Inject 1-2 mLs (2-4 mg total) into the vein every 2 (two) hours as needed (2mg  for moderate pain, 4mg  for severe pain; when taking PO please use oral meds preferentially). 09/16/22   Almon Hercules, MD  Multiple Vitamin (MULTIVITAMIN WITH MINERALS) TABS tablet Place 1 tablet into feeding tube daily. 09/16/22   Almon Hercules, MD  naloxone (NARCAN) 0.4 MG/ML injection Inject 1 mL (0.4 mg total) into the vein as needed. 09/16/22   Almon Hercules, MD  Nutritional Supplements (FEEDING SUPPLEMENT, JEVITY 1.5 CAL/FIBER,) LIQD Place 1,000 mLs into feeding tube continuous. 09/16/22   Almon Hercules, MD  ondansetron (ZOFRAN) 4 MG tablet Place 1 tablet (4 mg total) into feeding tube every 6 (six) hours as needed for  nausea. 09/16/22   Almon Hercules, MD  oxyCODONE (OXY IR/ROXICODONE) 5 MG immediate release tablet Place 1 tablet (5 mg total) into feeding tube every 4 (four) hours as needed for moderate pain (5mg  for moderate pain, 10mg  for severe pain). 09/16/22   Almon Hercules, MD  pantoprazole (PROTONIX) 40 MG injection Inject 40 mg into the vein every 12 (twelve) hours. 09/16/22   Almon Hercules, MD  polycarbophil (FIBERCON) 625 MG tablet Place 1 tablet (625 mg total) into feeding tube 2 (two) times daily. 09/16/22   Almon Hercules, MD  Protein (FEEDING SUPPLEMENT, PROSOURCE TF20,) liquid Place 60 mLs into feeding tube daily. 09/17/22   Almon Hercules, MD  revefenacin (YUPELRI)  175 MCG/3ML nebulizer solution Take 3 mLs (175 mcg total) by nebulization daily. 09/17/22   Almon Hercules, MD  sertraline (ZOLOFT) 50 MG tablet Place 3 tablets (150 mg total) into feeding tube daily. 09/16/22   Almon Hercules, MD  thiamine (VITAMIN B-1) 100 MG tablet Place 1 tablet (100 mg total) into feeding tube daily. 09/17/22   Almon Hercules, MD  zinc sulfate 220 (50 Zn) MG capsule Place 1 capsule (220 mg total) into feeding tube daily. 09/17/22   Almon Hercules, MD      Allergies    Iodinated contrast media, Aspirin, Ibuprofen, Sulfonamide derivatives, and Penicillins    Review of Systems   Review of Systems Negative except as per HPI Physical Exam Updated Vital Signs BP (!) 155/61   Pulse 97   Resp 14   SpO2 97%  Physical Exam Vitals and nursing note reviewed.  Constitutional:      General: She is not in acute distress.    Appearance: She is well-developed. She is not diaphoretic.  HENT:     Head: Normocephalic and atraumatic.  Pulmonary:     Effort: Pulmonary effort is normal.  Abdominal:     Palpations: Abdomen is soft.     Tenderness: There is no abdominal tenderness.     Comments: Right side colostomy   Skin:    General: Skin is warm and dry.     Findings: No erythema or rash.  Neurological:     Mental Status: She is alert and oriented to person, place, and time.  Psychiatric:        Behavior: Behavior normal.     ED Results / Procedures / Treatments   Labs (all labs ordered are listed, but only abnormal results are displayed) Labs Reviewed - No data to display  EKG None  Radiology No results found.  Procedures Procedures    Medications Ordered in ED Medications - No data to display  ED Course/ Medical Decision Making/ A&P                                 Medical Decision Making  72 year old female presents with request for colostomy supplies after her bag broke tonight. She is otherwise feeling well and without acute complaint.         Final  Clinical Impression(s) / ED Diagnoses Final diagnoses:  Colostomy care [facility])    Rx / DC Orders ED Discharge Orders     None         Alden Hipp 09/27/22 0224    Marily Memos, MD 09/27/22 [phone]

## 2022-09-27 NOTE — ED Triage Notes (Signed)
This pt signed out [facility] from [facility] yesterday after  spending 5 weeks in this hospital.  She had surgery for cancer and had a stroke during the surgery.  She has a colostomy and drains still   in place.  They were not given any colostomy bags so after she arrived home her bag burst and they had no bags to replace  this bag.  The son was goig to take care of his mother at home  he has no medical training.  Stool  covered her abdomen drain bags full .  Unable to get any bag from [facility] bags ordered from materials management.  The son reported that he was going to get hospice to come and care for her.   Pt alert  uncomfortable on the stretcher

## 2022-09-27 NOTE — ED Notes (Signed)
Left p drain emptied of 30ml

## 2022-09-27 NOTE — ED Notes (Signed)
Called [facility] to transport patient to home address: [address]    [place] [address]

## 2022-09-28 ENCOUNTER — Encounter (HOSPITAL_COMMUNITY): Payer: Self-pay

## 2022-09-28 ENCOUNTER — Other Ambulatory Visit: Payer: Self-pay

## 2022-09-28 ENCOUNTER — Emergency Department (HOSPITAL_COMMUNITY)
Admission: EM | Admit: 2022-09-28 | Discharge: 2022-09-29 | Disposition: A | Discharge status: Home / Self Care | Payer: Medicare Other | Attending: Emergency Medicine | Admitting: Emergency Medicine

## 2022-09-28 ENCOUNTER — Emergency Department (HOSPITAL_COMMUNITY): Payer: Medicare Other

## 2022-09-28 DIAGNOSIS — Z7951 Long term (current) use of inhaled steroids: Secondary | ICD-10-CM | POA: Insufficient documentation

## 2022-09-28 DIAGNOSIS — I110 Hypertensive heart disease with heart failure: Secondary | ICD-10-CM | POA: Insufficient documentation

## 2022-09-28 DIAGNOSIS — R1031 Right lower quadrant pain: Secondary | ICD-10-CM | POA: Insufficient documentation

## 2022-09-28 DIAGNOSIS — Z79899 Other long term (current) drug therapy: Secondary | ICD-10-CM | POA: Diagnosis not present

## 2022-09-28 DIAGNOSIS — I5020 Unspecified systolic (congestive) heart failure: Secondary | ICD-10-CM | POA: Insufficient documentation

## 2022-09-28 DIAGNOSIS — R102 Pelvic and perineal pain: Secondary | ICD-10-CM | POA: Insufficient documentation

## 2022-09-28 DIAGNOSIS — Z7901 Long term (current) use of anticoagulants: Secondary | ICD-10-CM | POA: Diagnosis not present

## 2022-09-28 DIAGNOSIS — J45909 Unspecified asthma, uncomplicated: Secondary | ICD-10-CM | POA: Diagnosis not present

## 2022-09-28 DIAGNOSIS — J449 Chronic obstructive pulmonary disease, unspecified: Secondary | ICD-10-CM | POA: Diagnosis not present

## 2022-09-28 DIAGNOSIS — C189 Malignant neoplasm of colon, unspecified: Secondary | ICD-10-CM

## 2022-09-28 DIAGNOSIS — R109 Unspecified abdominal pain: Secondary | ICD-10-CM

## 2022-09-28 DIAGNOSIS — I639 Cerebral infarction, unspecified: Secondary | ICD-10-CM

## 2022-09-28 LAB — COMPREHENSIVE METABOLIC PANEL
ALT: 8 U/L (ref 0–44)
AST: 16 U/L (ref 15–41)
Albumin: 2 g/dL — ABNORMAL LOW (ref 3.5–5.0)
Alkaline Phosphatase: 76 U/L (ref 38–126)
Anion gap: 12 (ref 5–15)
BUN: 12 mg/dL (ref 8–23)
CO2: 17 mmol/L — ABNORMAL LOW (ref 22–32)
Calcium: 8.2 mg/dL — ABNORMAL LOW (ref 8.9–10.3)
Chloride: 108 mmol/L (ref 98–111)
Creatinine, Ser: 0.9 mg/dL (ref 0.44–1.00)
GFR, Estimated: >60 mL/min (ref >60)
Glucose, Bld: 80 mg/dL (ref 70–99)
Potassium: 3.7 mmol/L (ref 3.5–5.1)
Sodium: 137 mmol/L (ref 135–145)
Total Bilirubin: 0.4 mg/dL (ref 0.3–1.2)
Total Protein: 5.4 g/dL — ABNORMAL LOW (ref 6.5–8.1)

## 2022-09-28 LAB — CBC WITH DIFFERENTIAL/PLATELET
Abs Immature Granulocytes: 0.02 10*3/uL (ref 0.00–0.07)
Basophils Absolute: 0 10*3/uL (ref 0.0–0.1)
Basophils Relative: 1 %
Eosinophils Absolute: 0.7 10*3/uL — ABNORMAL HIGH (ref 0.0–0.5)
Eosinophils Relative: 14 %
HCT: 26.4 % — ABNORMAL LOW (ref 36.0–46.0)
Hemoglobin: 7.9 g/dL — ABNORMAL LOW (ref 12.0–15.0)
Immature Granulocytes: 0 %
Lymphocytes Relative: 33 %
Lymphs Abs: 1.7 10*3/uL (ref 0.7–4.0)
MCH: 25 pg — ABNORMAL LOW (ref 26.0–34.0)
MCHC: 29.9 g/dL — ABNORMAL LOW (ref 30.0–36.0)
MCV: 83.5 fL (ref 80.0–100.0)
Monocytes Absolute: 0.3 10*3/uL (ref 0.1–1.0)
Monocytes Relative: 6 %
Neutro Abs: 2.4 10*3/uL (ref 1.7–7.7)
Neutrophils Relative %: 46 %
Platelets: 180 10*3/uL (ref 150–400)
RBC: 3.16 MIL/uL — ABNORMAL LOW (ref 3.87–5.11)
RDW: 22.5 % — ABNORMAL HIGH (ref 11.5–15.5)
WBC: 5.1 10*3/uL (ref 4.0–10.5)
nRBC: 0 % (ref 0.0–0.2)

## 2022-09-28 LAB — I-STAT CHEM 8, ED
BUN: 14 mg/dL (ref 8–23)
Calcium, Ion: 1.19 mmol/L (ref 1.15–1.40)
Chloride: 111 mmol/L (ref 98–111)
Creatinine, Ser: 0.8 mg/dL (ref 0.44–1.00)
Glucose, Bld: 79 mg/dL (ref 70–99)
HCT: 24 % — ABNORMAL LOW (ref 36.0–46.0)
Hemoglobin: 8.2 g/dL — ABNORMAL LOW (ref 12.0–15.0)
Potassium: 3.7 mmol/L (ref 3.5–5.1)
Sodium: 139 mmol/L (ref 135–145)
TCO2: 18 mmol/L — ABNORMAL LOW (ref 22–32)

## 2022-09-28 LAB — MAGNESIUM: Magnesium: 1.2 mg/dL — ABNORMAL LOW (ref 1.7–2.4)

## 2022-09-28 LAB — LIPASE, BLOOD: Lipase: 32 U/L (ref 11–51)

## 2022-09-28 MED ORDER — DIPHENHYDRAMINE HCL 50 MG/ML IJ SOLN: 25.0000 mg | Freq: Once | INTRAMUSCULAR | Status: DC

## 2022-09-28 MED ORDER — MAGNESIUM SULFATE 2 GM/50ML IV SOLN
2.0000 g | Freq: Once | INTRAVENOUS | Status: AC
  Administered 2022-09-28: 2 g via INTRAVENOUS
  Filled 2022-09-28: qty 50

## 2022-09-28 MED ORDER — DIPHENHYDRAMINE HCL 50 MG/ML IJ SOLN
50.0000 mg | Freq: Once | INTRAMUSCULAR | Status: AC
  Administered 2022-09-28: 50 mg via INTRAVENOUS
  Filled 2022-09-28: qty 1

## 2022-09-28 MED ORDER — METHYLPREDNISOLONE SODIUM SUCC 125 MG IJ SOLR
125.0000 mg | Freq: Once | INTRAMUSCULAR | Status: AC
  Administered 2022-09-28: 125 mg via INTRAVENOUS
  Filled 2022-09-28: qty 2

## 2022-09-28 MED ORDER — IOHEXOL 350 MG/ML SOLN
75.0000 mL | Freq: Once | INTRAVENOUS | Status: AC | PRN
  Administered 2022-09-28: 75 mL via INTRAVENOUS

## 2022-09-28 MED ORDER — ACETAMINOPHEN 500 MG PO TABS
1000.0000 mg | ORAL_TABLET | Freq: Once | ORAL | Status: AC
  Administered 2022-09-28: 1000 mg via ORAL
  Filled 2022-09-28: qty 2

## 2022-09-28 NOTE — ED Notes (Signed)
Patient transported to CT 

## 2022-09-28 NOTE — ED Provider Notes (Signed)
  Physical Exam  BP (!) 142/79   Pulse (!) 107   Temp 98 F (36.7 C) (Oral)   Resp 17   Ht 5\' 6"  (1.676 m)   Wt 78 kg   SpO2 99%   BMI 27.75 kg/m   Physical Exam Vitals and nursing note reviewed.  Constitutional:      General: She is not in acute distress.    Appearance: She is well-developed.  HENT:     Head: Normocephalic and atraumatic.  Eyes:     Conjunctiva/sclera: Conjunctivae normal.  Cardiovascular:     Rate and Rhythm: Normal rate and regular rhythm.     Heart sounds: No murmur heard. Pulmonary:     Effort: Pulmonary effort is normal. No respiratory distress.     Breath sounds: Normal breath sounds.  Abdominal:     Palpations: Abdomen is soft.     Tenderness: There is no abdominal tenderness.  Musculoskeletal:        General: No swelling.     Cervical back: Neck supple.  Skin:    General: Skin is warm and dry.  Neurological:     Mental Status: She is alert.  Psychiatric:        Mood and Affect: Mood normal.     Procedures  Procedures  ED Course / MDM   Clinical Course as of 09/29/22 0022  Mon Sep 28, 2022  [address] Hx colon cancer, dc'ed recently with multiple drainage tubes. Nephrostomy, colostomy, and 2 JP drains. While inpatient, also suffered CVA with residucal left sided deficits. Coming in with son with ongoing acute on chronic abdominal pain. Distended bowel loops on prior scans, no change in ostomy OP. Transitional care consulted, will work on placement. Now has PT consult in. Getting premedicated for CT scan.  [WL]    Clinical Course User Index [WL] Dyanne Iha, MD   Medical Decision Making Amount and/or Complexity of Data Reviewed Labs: ordered. Radiology: ordered.  Risk OTC drugs. Prescription drug management.   CT scan resulted with no acute abnormalities.  Upon reassessment, resting comfortably with no new symptoms.  Vital signs remained stable.  Spoke with social work about placement for hospice given that patient wishes to  pursue outpatient hospice at this time.  Social work to work on placement.  Patient to be placed in boarder status and to remain in the ED until morning time.  Please see social work notes for further details.       Dyanne Iha, MD 09/29/22 Darden Palmer, MD 09/29/22 [phone]

## 2022-09-28 NOTE — ED Triage Notes (Signed)
Per EMS, pt c/o of pain in the lower abdominal/pelvic region below the surgical incision site. No N/V. Reports pain 9/10.   Hx: stroke, left side deficit.   All colostomy and drains clean, dry and intact.

## 2022-09-28 NOTE — Evaluation (Signed)
Physical Therapy Evaluation Patient Details Name: Debra Sharp MRN: [id number] DOB: [birth year] Today's Date: 09/28/2022  History of Present Illness  72 y.o. female presents to [facility] hospital on 09/28/2022 with lower abdominal pain. Pt recently hospitalized 7/2-7/31 where she was found to have adenocarcinoma of small bowel complicated by colon perforation now s/p colostomy, nephrostomy and with 2 JP drains. Pt also experienced a CVA during surgery with L-sided deficits. PMH includes HTN, HLD, CHF, COPD, asthma.  Clinical Impression  Pt presents to PT with deficits in strength, functional mobility, balance, gait, endurance. Pt with L hemiplegia along with generalized weakness to R side. Pt is able to roll with assistance however she refuses attempts at sitting due to fear of falling. Pt will benefit from continued mobility and balance training in an effort to reduce falls risk and caregiver burden. PT recommends short term inpatient PT placement at the time of discharge.       If plan is discharge home, recommend the following: Two people to help with walking and/or transfers;Two people to help with bathing/dressing/bathroom;Assistance with cooking/housework;Assistance with feeding;Direct supervision/assist for medications management;Assist for transportation;Direct supervision/assist for financial management   Can travel by private vehicle   No    Equipment Recommendations Wheelchair (measurements PT);Wheelchair cushion (measurements PT);Hospital bed  Recommendations for Other Services       Functional Status Assessment Patient has had a recent decline in their functional status and/or demonstrates limited ability to make significant improvements in function in a reasonable and predictable amount of time     Precautions / Restrictions Precautions Precautions: Fall;Other (comment) Precaution Comments: JP drain x 2, nephrostomy bag, ostomy, Restrictions Weight Bearing Restrictions: No       Mobility  Bed Mobility Overal bed mobility: Needs Assistance Bed Mobility: Rolling Rolling: Min assist         General bed mobility comments: minA to roll to left side, pt refuses sidelying to sit due to fear of falling    Transfers                        Ambulation/Gait                  Stairs            Wheelchair Mobility     Tilt Bed    Modified Rankin (Stroke Patients Only)       Balance Overall balance assessment:  (pt refused attempts at sitting)                                           Pertinent Vitals/Pain Pain Assessment Pain Assessment: No/denies pain    Home Living Family/patient expects to be discharged to:: Skilled nursing facility Living Arrangements: Alone Available Help at Discharge: Family;Available 24 hours/day Type of Home: House Home Access: Stairs to enter Entrance Stairs-Rails: Right Entrance Stairs-Number of Steps: 2   Home Layout: One level Home Equipment: Pharmacist, hospital (2 wheels);Other (comment) (hoyer lift)      Prior Function Prior Level of Function : Needs assist             Mobility Comments: pt was ambulating with hand hold prior to July admission, since surgery and CVA pt has been max-totalA for bed mobility, utilizing hoyer lift for transfers       Extremity/Trunk Assessment   Upper Extremity Assessment Upper Extremity  Assessment: LUE deficits/detail;RUE deficits/detail RUE Deficits / Details: ROM WFL, grossly 4-/5 LUE Deficits / Details: pt with intention tremor in L digits, 2/5 digit flexion, 1/5 elbow flexion/extension, PROM WFL    Lower Extremity Assessment Lower Extremity Assessment: RLE deficits/detail;LLE deficits/detail RLE Deficits / Details: grossly 4-/5 LLE Deficits / Details: PROM WFL, no AROM noted    Cervical / Trunk Assessment Cervical / Trunk Assessment: Kyphotic  Communication   Communication Communication: No apparent  difficulties Cueing Techniques: Verbal cues  Cognition Arousal: Alert Behavior During Therapy: Flat affect Overall Cognitive Status: Within Functional Limits for tasks assessed                                          General Comments General comments (skin integrity, edema, etc.): VSS on RA    Exercises     Assessment/Plan    PT Assessment Patient needs continued PT services  PT Problem List Decreased range of motion;Decreased coordination;Decreased strength;Decreased mobility;Decreased safety awareness;Decreased activity tolerance;Decreased cognition;Decreased balance;Decreased knowledge of use of DME       PT Treatment Interventions DME instruction;Therapeutic activities;Cognitive remediation;Balance training;Functional mobility training;Neuromuscular re-education;Patient/family education;Therapeutic exercise;Wheelchair mobility training    PT Goals (Current goals can be found in the Care Plan section)  Acute Rehab PT Goals Patient Stated Goal: to go to rehab PT Goal Formulation: With patient Time For Goal Achievement: 10/12/22 Potential to Achieve Goals: Fair    Frequency Min 1X/week     Co-evaluation               AM-PAC PT "6 Clicks" Mobility  Outcome Measure Help needed turning from your back to your side while in a flat bed without using bedrails?: A Little Help needed moving from lying on your back to sitting on the side of a flat bed without using bedrails?: Total Help needed moving to and from a bed to a chair (including a wheelchair)?: Total Help needed standing up from a chair using your arms (e.g., wheelchair or bedside chair)?: Total Help needed to walk in hospital room?: Total Help needed climbing 3-5 steps with a railing? : Total 6 Click Score: 8    End of Session   Activity Tolerance: Other (comment) (limited by fear of falling) Patient left: in bed;with call bell/phone within reach;with family/visitor present (RN made aware  that pt will need easy touch call bell due to weakness in RUE) Nurse Communication: Mobility status;Need for lift equipment PT Visit Diagnosis: Other abnormalities of gait and mobility (R26.89);Muscle weakness (generalized) (M62.81);Hemiplegia and hemiparesis Hemiplegia - Right/Left: Left Hemiplegia - dominant/non-dominant: Non-dominant Hemiplegia - caused by: Cerebral infarction    Time: 4696-2952 PT Time Calculation (min) (ACUTE ONLY): 11 min   Charges:   PT Evaluation $PT Eval Low Complexity: 1 Low   PT General Charges $$ ACUTE PT VISIT: 1 Visit         Arlyss Gandy, PT, DPT Acute Rehabilitation Office [phone]   Arlyss Gandy 09/28/2022, 5:40 PM

## 2022-09-28 NOTE — ED Provider Notes (Signed)
Debra Sharp   CSN: [id number] Arrival date & time: 09/28/22  1351     History  Chief Complaint  Patient presents with   Abdominal Pain   Pelvic Pain    Pain below insertion site. From surgery 6 weeks ago     Debra Sharp is a 72 y.o. female.   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 She states it has been progressively worsening since her discharge. She denies any fevers or chills. She denies any vomiting. She denies any change in output from her colostomy, nephrostomy, or JP drains. She does have 2 JP drains in place. She notes the bandage from her incision site was changed yesterday and was doing well. She is also concerned that her son is having difficulty assisting her at home. She notes she is unable to assist with her own transfers due to her left sided weakness. Patient and family are concerned that she needs additional care.     Home Medications Prior to Admission medications   Medication Sig Start Date End Date Taking? Authorizing Provider  acetaminophen (TYLENOL) 160 MG/5ML solution Place 20.3 mLs (650 mg total) into feeding tube every 6 (six) hours. 09/16/22   Almon Hercules, MD  albuterol (PROVENTIL) (2.5 MG/3ML) 0.083% nebulizer solution Inhale 3 mLs (2.5 mg total) into the lungs every 4 (four) hours as needed for wheezing or shortness of breath. 09/16/22   Almon Hercules, MD  apixaban (ELIQUIS) 5 MG TABS tablet Place 1 tablet (5 mg total) into feeding tube 2 (two) times daily. 09/16/22   Almon Hercules, MD  arformoterol  (BROVANA) 15 MCG/2ML NEBU Take 2 mLs (15 mcg total) by nebulization 2 (two) times daily. 09/16/22   Almon Hercules, MD  ascorbic acid (VITAMIN C) 500 MG tablet Place 1 tablet (500 mg total) into feeding tube daily. 09/17/22   Almon Hercules, MD  atorvastatin (LIPITOR) 20 MG tablet Place 1 tablet (20 mg total) into feeding tube daily. 09/16/22   Almon Hercules, MD  budesonide (PULMICORT) 0.5 MG/2ML nebulizer solution Take 2 mLs (0.5 mg total) by nebulization 2 (two) times daily. 09/16/22   Almon Hercules, MD  Chlorhexidine Gluconate Cloth 2 % PADS Apply 6 each topically daily. 09/17/22   Almon Hercules, MD  diphenoxylate-atropine (LOMOTIL) 2.5-0.025 MG tablet Place 1 tablet into feeding tube 2 (two) times daily. 09/16/22   Almon Hercules, MD  ferrous sulfate 300 (60 Fe) MG/5ML syrup Place 5 mLs (300 mg total) into feeding tube 2 (two) times daily. 09/16/22   Almon Hercules, MD  hydrALAZINE (APRESOLINE) 20 MG/ML injection Inject 0.5 mLs (10 mg total) into the vein every 4 (four) hours as needed (SBP goal<180). 09/16/22   Almon Hercules, MD  loperamide HCl (IMODIUM) 1 MG/7.5ML suspension Place 15 mLs (2 mg total) into feeding tube 2 (two) times daily. 09/16/22   Almon Hercules, MD  melatonin 10 MG TABS Place 10 mg into feeding tube at bedtime as needed. 09/16/22   Almon Hercules, MD  meropenem 1 g in sodium chloride 0.9 %  100 mL Inject 1 g into the vein every 12 (twelve) hours for 28 days. Stop date 10/14/22 09/16/22 10/14/22  Rutha Bouchard T, MD  Morphine Sulfate (MORPHINE, PF,) 2 MG/ML injection Inject 1-2 mLs (2-4 mg total) into the vein every 2 (two) hours as needed (2mg  for moderate pain, 4mg  for severe pain; when taking PO please use oral meds preferentially). 09/16/22   Almon Hercules, MD  Multiple Vitamin (MULTIVITAMIN WITH MINERALS) TABS tablet Place 1 tablet into feeding tube daily. 09/16/22   Almon Hercules, MD  naloxone (NARCAN) 0.4 MG/ML injection Inject 1 mL (0.4 mg total) into the vein as needed. 09/16/22   Almon Hercules, MD   Nutritional Supplements (FEEDING SUPPLEMENT, JEVITY 1.5 CAL/FIBER,) LIQD Place 1,000 mLs into feeding tube continuous. 09/16/22   Almon Hercules, MD  ondansetron (ZOFRAN) 4 MG tablet Place 1 tablet (4 mg total) into feeding tube every 6 (six) hours as needed for nausea. 09/16/22   Almon Hercules, MD  oxyCODONE (OXY IR/ROXICODONE) 5 MG immediate release tablet Place 1 tablet (5 mg total) into feeding tube every 4 (four) hours as needed for moderate pain (5mg  for moderate pain, 10mg  for severe pain). 09/16/22   Almon Hercules, MD  pantoprazole (PROTONIX) 40 MG injection Inject 40 mg into the vein every 12 (twelve) hours. 09/16/22   Almon Hercules, MD  polycarbophil (FIBERCON) 625 MG tablet Place 1 tablet (625 mg total) into feeding tube 2 (two) times daily. 09/16/22   Almon Hercules, MD  Protein (FEEDING SUPPLEMENT, PROSOURCE TF20,) liquid Place 60 mLs into feeding tube daily. 09/17/22   Almon Hercules, MD  revefenacin (YUPELRI) 175 MCG/3ML nebulizer solution Take 3 mLs (175 mcg total) by nebulization daily. 09/17/22   Almon Hercules, MD  sertraline (ZOLOFT) 50 MG tablet Place 3 tablets (150 mg total) into feeding tube daily. 09/16/22   Almon Hercules, MD  thiamine (VITAMIN B-1) 100 MG tablet Place 1 tablet (100 mg total) into feeding tube daily. 09/17/22   Almon Hercules, MD  zinc sulfate 220 (50 Zn) MG capsule Place 1 capsule (220 mg total) into feeding tube daily. 09/17/22   Almon Hercules, MD      Allergies    Iodinated contrast media, Aspirin, Cefepime, Ibuprofen, Meropenem, Sulfonamide derivatives, and Penicillins    Review of Systems   Negative except for as noted above in the HPI  Physical Exam Updated Vital Signs BP (!) 148/74   Pulse 94   Temp 98.3 F (36.8 C)   Resp 15   Ht 5\' 6"  (1.676 m)   Wt 78 kg   SpO2 99%   BMI 27.75 kg/m  Physical Exam Vitals and nursing Sharp reviewed.  Constitutional:      General: She is not in acute distress.    Appearance: She is well-developed.  HENT:     Head:  Normocephalic and atraumatic.  Eyes:     Conjunctiva/sclera: Conjunctivae normal.  Cardiovascular:     Rate and Rhythm: Normal rate and regular rhythm.     Heart sounds: No murmur heard. Pulmonary:     Effort: Pulmonary effort is normal. No respiratory distress.     Breath sounds: Normal breath sounds.     Comments: Saturating well on room air Abdominal:     Comments: Colostomy present over the right lower abdomen.  Bandage over the abdominal incision site without any obvious drainage.  2 abdominal JP drains in place without any evidence of dislodgment  or surrounding drainage.  Nephrostomy tube in place.  Lower abdomen is soft and nontender, however tenderness appreciated over the left mid abdomen w/ firmness to palpation  Musculoskeletal:        General: No swelling.     Cervical back: Neck supple.  Skin:    General: Skin is warm and dry.     Capillary Refill: Capillary refill takes less than 2 seconds.  Neurological:     Mental Status: She is alert and oriented to person, place, and time.  Psychiatric:        Mood and Affect: Mood normal.     ED Results / Procedures / Treatments   Labs (all labs ordered are listed, but only abnormal results are displayed) Labs Reviewed  CBC WITH DIFFERENTIAL/PLATELET - Abnormal; Notable for the following components:      Result Value   RBC 3.16 (*)    Hemoglobin 7.9 (*)    HCT 26.4 (*)    MCH 25.0 (*)    MCHC 29.9 (*)    RDW 22.5 (*)    All other components within normal limits  I-STAT CHEM 8, ED - Abnormal; Notable for the following components:   TCO2 18 (*)    Hemoglobin 8.2 (*)    HCT 24.0 (*)    All other components within normal limits  COMPREHENSIVE METABOLIC PANEL  MAGNESIUM  LIPASE, BLOOD    EKG None  Radiology No results found.    Medications Ordered in ED Medications  diphenhydrAMINE (BENADRYL) injection 50 mg (50 mg Intravenous Given 09/28/22 1542)  methylPREDNISolone sodium succinate (SOLU-MEDROL) 125 mg/2 mL  injection 125 mg (125 mg Intravenous Given 09/28/22 1542)    ED Course/ Medical Decision Making/ A&P Clinical Course as of 09/28/22 1612  Mon Sep 28, 2022  1545 St. Hx colon cancer, dc'ed recently with multiple drainage tubes. Nephrostomy, colostomy, and 2 JP drains. While inpatient, also suffered CVA with residucal left sided deficits. Coming in with son with ongoing acute on chronic abdominal pain. Distended bowel loops on prior scans, no change in ostomy OP. Transitional care consulted, will work on placement. Now has PT consult in. Getting premedicated for CT scan.  [WL]    Clinical Course User Index [WL] Dyanne Iha, MD                                 Medical Decision Making Problems Addressed: Abdominal pain, unspecified abdominal location: complicated acute illness or injury  Amount and/or Complexity of Data Reviewed Independent Historian: caregiver    Details: son External Data Reviewed: labs, radiology and notes. Labs: ordered. Radiology: ordered and independent interpretation performed.  Risk Prescription drug management. Decision regarding hospitalization.   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  On exam, patient is alert and oriented.  She is in regular rate and rhythm.  Lungs are clear to auscultation.  Abdominal exam reveals colostomy present to the right lower abdomen.  2 JP drains in place without evidence of complication.  She does have tenderness present over the left lateral abdomen with firmness to palpation.  Presentation concerning for bowel obstruction,  abscess formation, hydronephrosis, increasing abdominal mass, kidney injury, dehydration, electrolyte derangement.  Lab work and CT imaging orders placed.  She has had a history of prior reactions to CT contrast, therefore premedication is ordered with Benadryl and Solu-Medrol.  Additionally, given concern for needs regarding additional care at home, transitional care orders placed.  Per social work recommendation, PT is ordered as well.  Patient is hemodynamically stable at this time.  Transfer of care to Dr. Mcneil Sober   Final Clinical Impression(s) / ED Diagnoses Final diagnoses:  Abdominal pain, unspecified abdominal location    Rx / DC Orders ED Discharge Orders     None         Rhys Martini, DO 09/28/22 1613    Eber Hong, MD 09/29/22 1209

## 2022-09-28 NOTE — Progress Notes (Signed)
Transition of Care [facility]) - Emergency Department Mini Assessment   Patient Details  Name: Debra Sharp: [id number] Date of Birth: 06-Mar-1950  Transition of Care [facility]) CM/SW Contact:    Georgie Chard, LCSW Phone Number: 09/28/2022, 9:32 PM   Clinical Narrative: CSW spoke to the patient's son. At this time the son is wanting the patient to be referred to [facility] for hospice/ palliative. Patient's son states that facility of their choice would be [facility]. Patient also stated that home hospice [facility] would also work for their family. This CSW did explain that there are requirements for Hospice/ Palliative. This CSW also explained how SNF worked  if either are those were not options. AT this time the patient will remain in the ED. This CSW has sent referral to hospice to follow up in AM. AM CSW also will follow up in the morning. This CSW has obtained PASRR for the first time and has completed FL2. FL2 was not sent out through [facility] due to family wanting hospice/ palliative care. TOC will continue to follow.    ED Mini Assessment: What brought you to the Emergency Department? : (P) Per EMS, pt c/o of pain in the lower abdominal/pelvic region below the surgical incision site. No N/V. Reports pain 9/10.      Hx: stroke, left side deficit.      All colostomy and drains clean, dry and intact.  Barriers to Discharge: (P) No Barriers Identified        Interventions which prevented an admission or readmission: SNF Placement, Hospice    Patient Contact and Communications     Spoke with: (P) Son Stettler Contact Date: (P) 09/28/22,   Contact time: (P) 2130 Contact Phone Number: (P) [phone] Call outcome: (P) Patient's son would like the mom to go to hospice. or have pallative  care.  Patient states their goals for this hospitalization and ongoing recovery are:: (P) Hospice      Admission diagnosis:  stomach pain; hx of colon cancer Patient Active Problem List    Diagnosis Date Noted   Intra-abdominal abscess (HCC) 09/14/2022   Cerebrovascular accident (CVA) due to thrombosis of precerebral artery (HCC) 09/13/2022   Adenocarcinoma of small bowel (HCC) 09/02/2022   Cerebrovascular accident (CVA) due to embolism of precerebral artery (HCC) 08/26/2022   Acute metabolic encephalopathy 08/26/2022   Delirium 08/26/2022   Encephalopathy acute 08/25/2022   Perforation bowel (HCC) 08/25/2022   Malnutrition of moderate degree 08/25/2022   Pelvic mass 08/23/2022   Change in bowel habits 08/23/2022   Abnormal CT scan, gastrointestinal tract 08/23/2022   Arterial hypotension 08/23/2022   Septic shock (HCC) 08/23/2022   Fecal occult blood test positive 08/23/2022   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/2022   Hydroureteronephrosis 08/23/2022   Sepsis with acute hypoxic respiratory failure and septic shock (HCC) 08/23/2022   Bowel perforation (HCC) 08/22/2022   Ventilator dependent (HCC) 08/22/2022   Abnormal CT of the abdomen 08/20/2022   Colon distention 08/20/2022   History of cardioembolic cerebrovascular accident (CVA) 08/20/2022   Silent cerebral infarction (HCC) 08/20/2022   GI bleed 08/18/2022   Iron deficiency anemia 04/25/2022   Epistaxis 04/25/2022   CHF (congestive heart failure), NYHA class I, acute, diastolic (HCC) 04/24/2022   Symptomatic anemia 04/24/2022   Thrombocytosis 04/24/2022   Elevated troponin 04/24/2022   Heart failure (HCC) 04/24/2022   LATERAL EPICONDYLITIS, LEFT 04/10/2010   PANIC DISORDER 10/25/2009   DEPRESSION 10/25/2009   COUGH 10/25/2009   SHOULDER PAIN, RIGHT 03/08/2009  Other specified chronic obstructive pulmonary disease 11/30/2008   WEIGHT GAIN 11/30/2008   CATARACT NOS 10/25/2006   Essential hypertension 10/25/2006   ALLERGIC RHINITIS 10/25/2006   ASTHMA 10/25/2006   DERMATOGRAPHIA 10/25/2006   CATARACT EXTRACTION, HX OF 10/25/2006   ARTHROSCOPY, KNEE, HX OF 10/25/2006   PCP:  Wilmer Floor, NP Pharmacy:    [facility] [address], [place] - [address] [place] [address] Phone: [phone] Fax: [phone]  [facility] [address] [place] [address] Phone: [phone] Fax: [phone]

## 2022-09-29 ENCOUNTER — Inpatient Hospital Stay: Payer: Medicare Other

## 2022-09-29 ENCOUNTER — Inpatient Hospital Stay: Payer: Medicare Other | Admitting: Hematology

## 2022-09-29 NOTE — Progress Notes (Signed)
OT Cancellation Note  Patient Details Name: Debra Sharp MRN: [id number] DOB: 1950/12/18   Cancelled Treatment:    Reason Eval/Treat Not Completed: Other (comment)  Pt discharging home at this time with transport here to pick her up.  Therapist unable to complete eval.  Recommend HHOT eval and treat if patient is eligible.    Perrin Maltese, OTR/L Acute Rehabilitation Services  Office [phone] 09/29/2022  09/29/2022, 10:26 AM

## 2022-09-29 NOTE — ED Notes (Signed)
Shift report received from [facility], Assumed care of patient at this time.

## 2022-09-29 NOTE — ED Provider Notes (Signed)
Emergency Medicine Observation Re-evaluation Note  Debra Sharp is a 72 y.o. female, seen on rounds today.  Pt initially presented to the ED for complaints of Abdominal Pain and Pelvic Pain (Pain below insertion site. From surgery 6 weeks ago ) Currently, the patient is awake and alert.  Pt was here overnight to see sw.  Pt did see them last night.  Pt does not want to be placed in a facility now.  She wants to go back home.  Her son is wiling to care for her at home, but does request help.  SW will contact pt at home.  Physical Exam  BP (!) 142/79   Pulse (!) 107   Temp 98 F (36.7 C) (Oral)   Resp 17   Ht 5\' 6"  (1.676 m)   Wt 78 kg   SpO2 99%   BMI 27.75 kg/m  Physical Exam General: awake and alert Cardiac: rr Lungs: clear Psych: calm  ED Course / MDM  EKG:   I have reviewed the labs performed to date as well as medications administered while in observation.  Recent changes in the last 24 hours include sw/pt eval.  Plan  Current plan is for d/c with home health per pt request.    Jacalyn Lefevre, MD 09/29/22 [phone]

## 2022-09-29 NOTE — Discharge Planning (Signed)
 J. Lucretia Roers, RN, BSN, NCM [phone] [facility] consulted regarding discharge planning for [facility]. Offered pt medicare.gov list of home health agencies to choose from.  Pt chose [facility] to render services. [facility] accepted referral.

## 2022-09-29 NOTE — ED Notes (Addendum)
[facility] arrived to transport pt home. Both pt and pt son verbalized understanding of plan.

## 2022-09-29 NOTE — ED Notes (Signed)
PT requested that IV be removed because it was hurting her arm.IV was removed with no complications.

## 2022-09-29 NOTE — ED Notes (Signed)
Patient refused vital signs when tech attempted to obtain them.

## 2022-09-29 NOTE — ED Notes (Signed)
Pt refusing vitals at this time.

## 2022-09-29 NOTE — Progress Notes (Signed)
CSW spoke with patient and her son Molly Maduro. Patient stated she would like to discharge home. Patient stated it has been just her and her son since he was 72 years old. Patient stated she would like to spend every minute with her son. Patient and her son stated they are interested in home hospice. CSW also asked patient if she had medicaid and she stated yes. CSW told patient that her primary care doctor can fill out personal care services paperwork and fax to medicaid to see if she qualifies for a home aide to assist them in the home a few hours each day. CSW provided the paperwork and labeled the pages for her primary doctor to complete.[facility] will speak to patient prior to discharge.

## 2022-09-29 NOTE — ED Notes (Signed)
Pt son approached nursing station and stated that the pt would like to leave and would no longer like to wait for plan of care. Pt is requesting to speak with SW and be provided with an ambulance home. Awaiting to hear back from SW. Charge RN made aware.

## 2022-10-08 ENCOUNTER — Inpatient Hospital Stay: Payer: Medicare Other | Admitting: Internal Medicine

## 2022-10-09 ENCOUNTER — Emergency Department (HOSPITAL_COMMUNITY): Payer: Medicare Other

## 2022-10-09 ENCOUNTER — Encounter (HOSPITAL_COMMUNITY): Payer: Self-pay

## 2022-10-09 ENCOUNTER — Emergency Department (HOSPITAL_COMMUNITY): Admission: EM | Admit: 2022-10-09 | Payer: Medicare Other | Source: Home / Self Care

## 2022-10-09 ENCOUNTER — Other Ambulatory Visit: Payer: Self-pay

## 2022-10-09 DIAGNOSIS — Z85038 Personal history of other malignant neoplasm of large intestine: Secondary | ICD-10-CM | POA: Diagnosis not present

## 2022-10-09 DIAGNOSIS — Z7902 Long term (current) use of antithrombotics/antiplatelets: Secondary | ICD-10-CM | POA: Diagnosis not present

## 2022-10-09 DIAGNOSIS — M545 Low back pain, unspecified: Secondary | ICD-10-CM | POA: Diagnosis present

## 2022-10-09 DIAGNOSIS — R300 Dysuria: Secondary | ICD-10-CM | POA: Insufficient documentation

## 2022-10-09 LAB — COMPREHENSIVE METABOLIC PANEL
ALT: 10 U/L (ref 0–44)
AST: 15 U/L (ref 15–41)
Albumin: 2.3 g/dL — ABNORMAL LOW (ref 3.5–5.0)
Alkaline Phosphatase: 101 U/L (ref 38–126)
Anion gap: 8 (ref 5–15)
BUN: 8 mg/dL (ref 8–23)
CO2: 20 mmol/L — ABNORMAL LOW (ref 22–32)
Calcium: 7.8 mg/dL — ABNORMAL LOW (ref 8.9–10.3)
Chloride: 109 mmol/L (ref 98–111)
Creatinine, Ser: 0.73 mg/dL (ref 0.44–1.00)
GFR, Estimated: >60 mL/min (ref >60)
Glucose, Bld: 96 mg/dL (ref 70–99)
Potassium: 3.5 mmol/L (ref 3.5–5.1)
Sodium: 137 mmol/L (ref 135–145)
Total Bilirubin: 0.4 mg/dL (ref 0.3–1.2)
Total Protein: 6 g/dL — ABNORMAL LOW (ref 6.5–8.1)

## 2022-10-09 LAB — CBC
HCT: 27.3 % — ABNORMAL LOW (ref 36.0–46.0)
Hemoglobin: 8.2 g/dL — ABNORMAL LOW (ref 12.0–15.0)
MCH: 25.1 pg — ABNORMAL LOW (ref 26.0–34.0)
MCHC: 30 g/dL (ref 30.0–36.0)
MCV: 83.5 fL (ref 80.0–100.0)
Platelets: 678 10*3/uL — ABNORMAL HIGH (ref 150–400)
RBC: 3.27 MIL/uL — ABNORMAL LOW (ref 3.87–5.11)
RDW: 21 % — ABNORMAL HIGH (ref 11.5–15.5)
WBC: 12 10*3/uL — ABNORMAL HIGH (ref 4.0–10.5)
nRBC: 0 % (ref 0.0–0.2)

## 2022-10-09 MED ORDER — OXYCODONE HCL 5 MG PO TABS
5.0000 mg | ORAL_TABLET | Freq: Four times a day (QID) | ORAL | 0 refills | Status: AC | PRN
Start: 2022-10-09 — End: 2022-10-11

## 2022-10-09 MED ORDER — HYDROMORPHONE HCL 1 MG/ML IJ SOLN
1.0000 mg | Freq: Once | INTRAMUSCULAR | Status: AC
  Administered 2022-10-09: 1 mg via INTRAVENOUS
  Filled 2022-10-09: qty 1

## 2022-10-09 MED ORDER — LIDOCAINE 5 % EX PTCH: 1.0000 | MEDICATED_PATCH | CUTANEOUS | 0 refills | Status: AC

## 2022-10-09 MED ORDER — DIPHENHYDRAMINE HCL 50 MG/ML IJ SOLN
50.0000 mg | Freq: Once | INTRAMUSCULAR | Status: AC
  Administered 2022-10-09: 50 mg via INTRAVENOUS
  Filled 2022-10-09: qty 1

## 2022-10-09 NOTE — Discharge Instructions (Addendum)
You have been prescribed a short increase in your oxycodone to help with your back pain. You may take an additional 5mg  oxycodone every 6 hours for breakthrough pain.   You have also been prescribed a lidocaine patch. Please apply this to the lower back to help with pain. You may apply a new patch once every 24 hours as needed for back pain.   Please follow up with Dr. Alanda Slim for further pain management.  Please return the ER if you develop unexplained fever or chills, loss of bowel or bladder control, uncontrolled pain, any other new or concerning symptoms.

## 2022-10-09 NOTE — ED Provider Notes (Signed)
[place] EMERGENCY DEPARTMENT AT [facility] Provider Note   CSN: [id number] Arrival date & time: 10/09/22  1221     History  Chief Complaint  Patient presents with   Back Pain    Debra Sharp is a 72 y.o. female with recently diagnosed colon cancer with partial colectomy 7/6, nephrostomy tube placement 08/31/22, history of CVA with left-sided deficits, presents with concern for lower back pain that is worsened this morning.  She does have low back pain at baseline, but began to worsen significantly to the point it was not controlled with her at home oxycodone 5 mg.  She also notes more difficulty with urination.  She has been urinating just not as frequently.  Denies any dysuria, hematuria.  Denies any changes to her bowel habits.  Denies any fevers or chills.   Back Pain      Home Medications Prior to Admission medications   Medication Sig Start Date End Date Taking? Authorizing Provider  lidocaine (LIDODERM) 5 % Place 1 patch onto the skin daily for 7 days. Remove & Discard patch within 12 hours or as directed by MD 10/09/22 10/16/22 Yes Arabella Merles, PA-C  oxyCODONE (ROXICODONE) 5 MG immediate release tablet Take 1 tablet (5 mg total) by mouth every 6 (six) hours as needed for up to 2 days for breakthrough pain or severe pain. 10/09/22 10/11/22 Yes Arabella Merles, PA-C  acetaminophen (TYLENOL) 160 MG/5ML solution Place 20.3 mLs (650 mg total) into feeding tube every 6 (six) hours. 09/16/22   Almon Hercules, MD  albuterol (PROVENTIL) (2.5 MG/3ML) 0.083% nebulizer solution Inhale 3 mLs (2.5 mg total) into the lungs every 4 (four) hours as needed for wheezing or shortness of breath. 09/16/22   Almon Hercules, MD  apixaban (ELIQUIS) 5 MG TABS tablet Place 1 tablet (5 mg total) into feeding tube 2 (two) times daily. 09/16/22   Almon Hercules, MD  arformoterol (BROVANA) 15 MCG/2ML NEBU Take 2 mLs (15 mcg total) by nebulization 2 (two) times daily. 09/16/22   Almon Hercules, MD   ascorbic acid (VITAMIN C) 500 MG tablet Place 1 tablet (500 mg total) into feeding tube daily. 09/17/22   Almon Hercules, MD  atorvastatin (LIPITOR) 20 MG tablet Place 1 tablet (20 mg total) into feeding tube daily. 09/16/22   Almon Hercules, MD  budesonide (PULMICORT) 0.5 MG/2ML nebulizer solution Take 2 mLs (0.5 mg total) by nebulization 2 (two) times daily. 09/16/22   Almon Hercules, MD  Chlorhexidine Gluconate Cloth 2 % PADS Apply 6 each topically daily. 09/17/22   Almon Hercules, MD  diphenoxylate-atropine (LOMOTIL) 2.5-0.025 MG tablet Place 1 tablet into feeding tube 2 (two) times daily. 09/16/22   Almon Hercules, MD  ferrous sulfate 300 (60 Fe) MG/5ML syrup Place 5 mLs (300 mg total) into feeding tube 2 (two) times daily. 09/16/22   Almon Hercules, MD  hydrALAZINE (APRESOLINE) 20 MG/ML injection Inject 0.5 mLs (10 mg total) into the vein every 4 (four) hours as needed (SBP goal<180). 09/16/22   Almon Hercules, MD  loperamide HCl (IMODIUM) 1 MG/7.5ML suspension Place 15 mLs (2 mg total) into feeding tube 2 (two) times daily. 09/16/22   Almon Hercules, MD  melatonin 10 MG TABS Place 10 mg into feeding tube at bedtime as needed. 09/16/22   Almon Hercules, MD  meropenem 1 g in sodium chloride 0.9 % 100 mL Inject 1 g into the vein every 12 (twelve) hours  for 28 days. Stop date 10/14/22 09/16/22 10/14/22  Rutha Bouchard T, MD  Morphine Sulfate (MORPHINE, PF,) 2 MG/ML injection Inject 1-2 mLs (2-4 mg total) into the vein every 2 (two) hours as needed (2mg  for moderate pain, 4mg  for severe pain; when taking PO please use oral meds preferentially). 09/16/22   Almon Hercules, MD  Multiple Vitamin (MULTIVITAMIN WITH MINERALS) TABS tablet Place 1 tablet into feeding tube daily. 09/16/22   Almon Hercules, MD  naloxone (NARCAN) 0.4 MG/ML injection Inject 1 mL (0.4 mg total) into the vein as needed. 09/16/22   Almon Hercules, MD  Nutritional Supplements (FEEDING SUPPLEMENT, JEVITY 1.5 CAL/FIBER,) LIQD Place 1,000 mLs into feeding tube  continuous. 09/16/22   Almon Hercules, MD  ondansetron (ZOFRAN) 4 MG tablet Place 1 tablet (4 mg total) into feeding tube every 6 (six) hours as needed for nausea. 09/16/22   Almon Hercules, MD  oxyCODONE (OXY IR/ROXICODONE) 5 MG immediate release tablet Place 1 tablet (5 mg total) into feeding tube every 4 (four) hours as needed for moderate pain (5mg  for moderate pain, 10mg  for severe pain). 09/16/22   Almon Hercules, MD  pantoprazole (PROTONIX) 40 MG injection Inject 40 mg into the vein every 12 (twelve) hours. 09/16/22   Almon Hercules, MD  polycarbophil (FIBERCON) 625 MG tablet Place 1 tablet (625 mg total) into feeding tube 2 (two) times daily. 09/16/22   Almon Hercules, MD  Protein (FEEDING SUPPLEMENT, PROSOURCE TF20,) liquid Place 60 mLs into feeding tube daily. 09/17/22   Almon Hercules, MD  revefenacin (YUPELRI) 175 MCG/3ML nebulizer solution Take 3 mLs (175 mcg total) by nebulization daily. 09/17/22   Almon Hercules, MD  sertraline (ZOLOFT) 50 MG tablet Place 3 tablets (150 mg total) into feeding tube daily. 09/16/22   Almon Hercules, MD  thiamine (VITAMIN B-1) 100 MG tablet Place 1 tablet (100 mg total) into feeding tube daily. 09/17/22   Almon Hercules, MD  zinc sulfate 220 (50 Zn) MG capsule Place 1 capsule (220 mg total) into feeding tube daily. 09/17/22   Almon Hercules, MD      Allergies    Iodinated contrast media, Aspirin, Cefepime, Ibuprofen, Meropenem, Sulfonamide derivatives, and Penicillins    Review of Systems   Review of Systems  Musculoskeletal:  Positive for back pain.    Physical Exam Updated Vital Signs BP (!) 116/53   Pulse 88   Temp 98.7 F (37.1 C) (Oral)   Resp 18   Ht 5\' 6"  (1.676 m)   Wt 63.5 kg   SpO2 97%   BMI 22.60 kg/m  Physical Exam Vitals and nursing note reviewed.  Constitutional:      General: She is not in acute distress.    Appearance: She is well-developed.  HENT:     Head: Normocephalic and atraumatic.  Eyes:     Conjunctiva/sclera: Conjunctivae normal.   Cardiovascular:     Rate and Rhythm: Normal rate and regular rhythm.     Heart sounds: No murmur heard. Pulmonary:     Effort: Pulmonary effort is normal. No respiratory distress.     Breath sounds: Normal breath sounds.  Abdominal:     Palpations: Abdomen is soft.     Tenderness: There is no abdominal tenderness.     Comments: Nephrostomy tube over the right flank, mild area of erythema, no purulent drainage from tube insertion site  Colostomy of abdomen, no erythema or edema at site  Musculoskeletal:  General: No swelling.     Cervical back: Normal range of motion and neck supple.     Comments: No tenderness palpation of the spinous processes  Skin:    General: Skin is warm and dry.     Capillary Refill: Capillary refill takes less than 2 seconds.  Neurological:     General: No focal deficit present.     Mental Status: She is alert and oriented to person, place, and time. Mental status is at baseline.  Psychiatric:        Mood and Affect: Mood normal.     ED Results / Procedures / Treatments   Labs (all labs ordered are listed, but only abnormal results are displayed) Labs Reviewed  CBC - Abnormal; Notable for the following components:      Result Value   WBC 12.0 (*)    RBC 3.27 (*)    Hemoglobin 8.2 (*)    HCT 27.3 (*)    MCH 25.1 (*)    RDW 21.0 (*)    Platelets 678 (*)    All other components within normal limits  COMPREHENSIVE METABOLIC PANEL - Abnormal; Notable for the following components:   CO2 20 (*)    Calcium 7.8 (*)    Total Protein 6.0 (*)    Albumin 2.3 (*)    All other components within normal limits  URINALYSIS, ROUTINE W REFLEX MICROSCOPIC    EKG None  Radiology CT ABDOMEN PELVIS WO CONTRAST  Result Date: 10/09/2022 CLINICAL DATA:  Lower back pain. History of colon cancer. Hospice patient. EXAM: CT ABDOMEN AND PELVIS WITHOUT CONTRAST TECHNIQUE: Multidetector CT imaging of the abdomen and pelvis was performed following the standard  protocol without IV contrast. RADIATION DOSE REDUCTION: This exam was performed according to the departmental dose-optimization program which includes automated exposure control, adjustment of the mA and/or kV according to patient size and/or use of iterative reconstruction technique. COMPARISON:  September 28, 2022. FINDINGS: Lower chest: Small left pleural effusion is noted with adjacent atelectasis or infiltrate. Mild right posterior basilar atelectasis or infiltrate is noted. Hepatobiliary: No focal liver abnormality is seen. No gallstones, gallbladder wall thickening, or biliary dilatation. Pancreas: Unremarkable. No pancreatic ductal dilatation or surrounding inflammatory changes. Spleen: Stable large splenic infarction. Adrenals/Urinary Tract: Adrenal glands are unremarkable. Stable position of right percutaneous nephrostomy. Mild right renal atrophy is noted. Small nonobstructive left renal calculus is noted. Mild left hydroureteronephrosis is noted secondary to compression of distal left ureter. Mildly distended urinary bladder. Stomach/Bowel: Stomach is unremarkable. There is no evidence of bowel obstruction. Colostomy is noted in right lower quadrant. Continued presence of 12 x 6.5 cm right pelvic mass most consistent with malignancy or metastatic disease. Vascular/Lymphatic: Aortic atherosclerosis. No enlarged abdominal or pelvic lymph nodes. Reproductive: Large calcified uterine fibroid is noted. Ovaries are not well visualized. Other: Surgical drain is seen involving left lower quadrant with distal tip in right lower quadrant. Percutaneous drainage catheter is seen entering right lateral abdominal wall without significant surrounding fluid. Musculoskeletal: Stable grade 1 anterolisthesis of L4-5 secondary to posterior facet joint hypertrophy. No acute osseous abnormality is noted. IMPRESSION: Small left pleural effusion is noted with adjacent left lower lobe atelectasis or infiltrate. Mild right posterior  basilar atelectasis or infiltrate is noted. Stable large splenic infarction. Stable position of right percutaneous nephrostomy catheter. Small nonobstructive left renal calculus. Mild left hydroureteronephrosis is noted presumably due to distal left ureteral compression due to tumor. Grossly stable 12 x 6.5 cm right pelvic mass most consistent  with malignancy or metastatic disease. Large calcified uterine fibroid. Stable right lower quadrant percutaneous drainage catheter and left lower quadrant surgical drain. Stable grade 1 anterolisthesis of L4-5 secondary to posterior facet joint hypertrophy Electronically Signed   By: Lupita Raider M.D.   On: 10/09/2022 15:43    Procedures Procedures    Medications Ordered in ED Medications  HYDROmorphone (DILAUDID) injection 1 mg (1 mg Intravenous Given 10/09/22 1513)  diphenhydrAMINE (BENADRYL) injection 50 mg (50 mg Intravenous Given 10/09/22 1552)    ED Course/ Medical Decision Making/ A&P                                 Medical Decision Making Amount and/or Complexity of Data Reviewed Labs: ordered. Radiology: ordered.  Risk Prescription drug management.   72 y.o. female with pertinent past medical history of recently diagnosed colon cancer with partial colectomy 7/6, nephrostomy tube placement 08/31/22, history of CVA with left-sided deficits,  presents to the ED for concern of worsening bilateral lower back pain that started this morning   Differential diagnosis includes but is not limited to musculoskeletal pain, nephrolithiasis, tumor metastasis, epidural abscess, vertebral fracture  ED Course:  Patient presents with concern for worsening bilateral lower back pain that started this morning.  She states she has lower back pain at baseline but she came in since her at home oxycodone did not help with the pain this morning.  She denies any fever or chills, loss of bowel or bladder control, IV drug use.  She was noting decreased urination this  morning as well, but has still been able to urinate.  Denies any dysuria, hematuria.  Per patient and son at bedside, her mobility is at baseline.  Upon examination, she does not have any midline spinal tenderness palpation.  No CVA tenderness.  Her nephrostomy tube placement does not seem infected.  Overall, her vital signs are within normal limits upon arrival.  CBC consistent with CBC taken 2 weeks ago, no acute changes.  CMP also consistent with patient's baseline.  Given patient's oncologic history, a CT abdomen pelvis was obtained which showed no acute changes that would explain her lower back pain.  A small left pleural effusion was noted, however patient is not having any shortness of breath, cough, fever or chills be concerning for pneumonia or infectious etiology.  She is currently stating 97% on room air. I discussed this case with Dr. Rodena Medin. Plan was to obtain MRI lumbar spine to further evaluate- rule out epidural abscess, metastasis, or other pathology given her back pain came suddenly and with her oncologic history.  However, upon talking to the patient about this plan, she declines MRI at this time.  She reports her pain has improved significantly with the IV Dilaudid.  She is alert and oriented x 3, able to make decisions.  She states she is on hospice and even if something was found on MRI, she would not want to do anything about it.  She understands it is our recommendation that she obtain the MRI today. She verbalizes understanding and wishes to not get the MRI and wishes to go home at this time.  Admission or further observation is also offered to the patient, and she declines all of this as well. She is currently at her baseline status regarding mobility and neurologic function.  She does have some left-sided weakness from prior CVA, but this is unchanged per her and  her son at the bedside.  Vital signs are currently stable.  Patient was discharged with a short increase of her oxycodone  for the next 2 days, was also prescribed lidocaine patches to use for the next week to help with pain.  Strict return precautions given  Impression: Bilateral low back pain  Disposition:  The patient was discharged home with instructions to take oxycodone as needed for breakthrough pain, use lidocaine patches as needed for back pain.  Follow-up with Dr. Alanda Slim who manages her pain medication for further pain management. Return precautions given.  Lab Tests: I Ordered, and personally interpreted labs.  The pertinent results include:   CBC and CMP unchanged from baseline  Imaging Studies ordered: I ordered imaging studies including CT abdomen pelvis I independently visualized the imaging with scope of interpretation limited to determining acute life threatening conditions related to emergency care. Imaging showed no acute changes, small left-sided pleural effusion I agree with the radiologist interpretation   External records from outside source obtained and reviewed including PDMR for oxycodone use   Co morbidities that complicate the patient evaluation  recently diagnosed colon cancer with partial colectomy 7/6, nephrostomy tube placement 08/31/22, history of CVA with left-sided deficits,              Final Clinical Impression(s) / ED Diagnoses Final diagnoses:  Acute bilateral low back pain without sciatica    Rx / DC Orders ED Discharge Orders          Ordered    oxyCODONE (ROXICODONE) 5 MG immediate release tablet  Every 6 hours PRN        10/09/22 1828    lidocaine (LIDODERM) 5 %  Every 24 hours        10/09/22 1828              Arabella Merles, PA-C 10/09/22 Josefina Do, MD 10/11/22 [phone]

## 2022-10-09 NOTE — ED Triage Notes (Addendum)
Per EMS, pt is coming from home. Called out for back pain in the lumbar region that began today. Denies any injuries, but does endorse some difficulty region. Pt has deficits  on the left side from a previous stroke, also recently dx with cancer. Pt took 5mg  oxycodone at 1200 without relief.

## 2022-10-09 NOTE — ED Notes (Signed)
[facility] notified for follow up. Reported she is second on the list.

## 2022-10-10 DIAGNOSIS — M545 Low back pain, unspecified: Secondary | ICD-10-CM | POA: Diagnosis not present

## 2022-10-10 MED ORDER — OXYCODONE HCL 5 MG PO TABS
5.0000 mg | ORAL_TABLET | Freq: Once | ORAL | Status: AC
  Administered 2022-10-10: 5 mg via ORAL
  Filled 2022-10-10: qty 1

## 2022-10-10 NOTE — ED Notes (Signed)
Discharge instructions reviewed with pt/ family member.   Prescriptions reviewed with opportunity for questions and concerns provided.   Pain medication administered.   [facility] ambulance present for pt transport back home.

## 2022-10-11 NOTE — Progress Notes (Deleted)
PATIENT: JACQUELYNE CUN DOB: [birth year]  REASON FOR VISIT: follow up HISTORY FROM: patient PRIMARY NEUROLOGIST:   HISTORY OF PRESENT ILLNESS: Today 10/11/22  ALIEYAH WAHEED is a 72 y.o. female who has been followed in this office for ***. Returns today for follow-up.       HISTORY Ms. Milla Mcpeters is a 73 y.o. female with history of HTN, HLD, HFrEF, thrombocytopenia, COPD, asthma, and depression presenting initially with generalized weakness, abdominal pain, and anemia concerning for UGIB and IDA with complications during admission including colon perforation, emergent ex lap, right hemicolectomy, ileostomy with fistula, biopsy of pelvic mass 08/22/2022, incidental punctate infarcts at the right parietal lobe, left frontal centrum semiovale, and right cerebellum due to small vessel disease versus embolic from malignancy 7/4, and new onset of left-sided weakness following extubation 08/24/2022.  MRI revealed acute and late acute infarcts in multiple vascular territories concerning for hypoperfusion in the setting of severe anemia and septic shock with hypotension.  Stroke team evaluated patient and signed off with recommendations of Plavix per GI team.   SIGNIFICANT HOSPITAL EVENTS 7/2: Admission to hospital 7/4: Incidental punctate infarcts right parietal, left frontal centrum semiovale, and right cerebellar 7/6: Emergent ex lap due to colon perforation, right hemicolectomy, ileostomy with fistula, biopsy of pelvic mass.  Severe anemia, septic shock. 7/8: Extubation with new onset of left-sided weakness.   7/10: MRI with acute and late acute infarcts in multiple vascular territories concerning for hypoperfusion injury. 7/12: IV heparin initiated for acute PE treatment, neurology signed off   7/26: Code stroke activated for left-sided flaccid weakness, left-sided neglect.  CT angio obtained revealing a new severe stenosis in the right P2 PCA.  Patient is not a candidate for interventions  due to recent stroke.  Recrudescence of previous stroke symptoms versus new stroke Etiology: Pending further evaluation (MRI brain pending), patient with multiple stroke risk factors including hypercoagulable state, recent PE, malignancy, multiple recent strokes CT head 7/26: Unresolved cytotoxic edema in the right MCA territory since the MRI on 08/26/2022.  Other infarcts at that time less apparent by CT.  No new cortically based infarct or acute intracranial hemorrhage identified. CTA head & neck 7/26: No intracranial LVO.  Intracranial atherosclerotic disease.  Most notably, there are apparent sites of up to severe stenosis within the right PCA P2 segment, new from the prior CTA of 08/26/2022. MRI pending 2D Echo 7/3: LVEF 40-45% Patient declined TEE per previous recommendations LDL 58 HgbA1c 5.4 VTE prophylaxis -patient is on Eliquis No antithrombotic prior to admission, now on Eliquis (apixaban) daily  Therapy recommendations:  SNF Disposition: Pending  REVIEW OF SYSTEMS: Out of a complete 14 system review of symptoms, the patient complains only of the following symptoms, and all other reviewed systems are negative.  ALLERGIES: Allergies  Allergen Reactions   Iodinated Contrast Media Shortness Of Breath and Itching    Resolved with benadryl and solu-mederol. Consider premedication prior to future studies.   Aspirin Other (See Comments)    Caused an asthma attack   Cefepime     AMS   Ibuprofen Swelling and Other (See Comments)    Angioedema and Asthma exacerbation   Meropenem Hives   Sulfonamide Derivatives Swelling and Other (See Comments)    Face swelled SEVERELY   Penicillins Rash    HOME MEDICATIONS: Outpatient Medications Prior to Visit  Medication Sig Dispense Refill   acetaminophen (TYLENOL) 160 MG/5ML solution Place 20.3 mLs (650 mg total) into feeding tube every 6 (six)  hours.     albuterol (PROVENTIL) (2.5 MG/3ML) 0.083% nebulizer solution Inhale 3 mLs (2.5 mg  total) into the lungs every 4 (four) hours as needed for wheezing or shortness of breath.     apixaban (ELIQUIS) 5 MG TABS tablet Place 1 tablet (5 mg total) into feeding tube 2 (two) times daily.     arformoterol (BROVANA) 15 MCG/2ML NEBU Take 2 mLs (15 mcg total) by nebulization 2 (two) times daily.     ascorbic acid (VITAMIN C) 500 MG tablet Place 1 tablet (500 mg total) into feeding tube daily.     atorvastatin (LIPITOR) 20 MG tablet Place 1 tablet (20 mg total) into feeding tube daily.     budesonide (PULMICORT) 0.5 MG/2ML nebulizer solution Take 2 mLs (0.5 mg total) by nebulization 2 (two) times daily.     Chlorhexidine Gluconate Cloth 2 % PADS Apply 6 each topically daily.     diphenoxylate-atropine (LOMOTIL) 2.5-0.025 MG tablet Place 1 tablet into feeding tube 2 (two) times daily.     ferrous sulfate 300 (60 Fe) MG/5ML syrup Place 5 mLs (300 mg total) into feeding tube 2 (two) times daily.     hydrALAZINE (APRESOLINE) 20 MG/ML injection Inject 0.5 mLs (10 mg total) into the vein every 4 (four) hours as needed (SBP goal<180).     lidocaine (LIDODERM) 5 % Place 1 patch onto the skin daily for 7 days. Remove & Discard patch within 12 hours or as directed by MD 7 patch 0   loperamide HCl (IMODIUM) 1 MG/7.5ML suspension Place 15 mLs (2 mg total) into feeding tube 2 (two) times daily.     melatonin 10 MG TABS Place 10 mg into feeding tube at bedtime as needed.     meropenem 1 g in sodium chloride 0.9 % 100 mL Inject 1 g into the vein every 12 (twelve) hours for 28 days. Stop date 10/14/22     Morphine Sulfate (MORPHINE, PF,) 2 MG/ML injection Inject 1-2 mLs (2-4 mg total) into the vein every 2 (two) hours as needed (2mg  for moderate pain, 4mg  for severe pain; when taking PO please use oral meds preferentially).     Multiple Vitamin (MULTIVITAMIN WITH MINERALS) TABS tablet Place 1 tablet into feeding tube daily.     naloxone (NARCAN) 0.4 MG/ML injection Inject 1 mL (0.4 mg total) into the vein as  needed.     Nutritional Supplements (FEEDING SUPPLEMENT, JEVITY 1.5 CAL/FIBER,) LIQD Place 1,000 mLs into feeding tube continuous.     ondansetron (ZOFRAN) 4 MG tablet Place 1 tablet (4 mg total) into feeding tube every 6 (six) hours as needed for nausea.     oxyCODONE (OXY IR/ROXICODONE) 5 MG immediate release tablet Place 1 tablet (5 mg total) into feeding tube every 4 (four) hours as needed for moderate pain (5mg  for moderate pain, 10mg  for severe pain).     oxyCODONE (ROXICODONE) 5 MG immediate release tablet Take 1 tablet (5 mg total) by mouth every 6 (six) hours as needed for up to 2 days for breakthrough pain or severe pain. 8 tablet 0   pantoprazole (PROTONIX) 40 MG injection Inject 40 mg into the vein every 12 (twelve) hours.     polycarbophil (FIBERCON) 625 MG tablet Place 1 tablet (625 mg total) into feeding tube 2 (two) times daily.     Protein (FEEDING SUPPLEMENT, PROSOURCE TF20,) liquid Place 60 mLs into feeding tube daily.     revefenacin (YUPELRI) 175 MCG/3ML nebulizer solution Take 3 mLs (175 mcg  total) by nebulization daily.     sertraline (ZOLOFT) 50 MG tablet Place 3 tablets (150 mg total) into feeding tube daily.     thiamine (VITAMIN B-1) 100 MG tablet Place 1 tablet (100 mg total) into feeding tube daily.     zinc sulfate 220 (50 Zn) MG capsule Place 1 capsule (220 mg total) into feeding tube daily.     No facility-administered medications prior to visit.    PAST MEDICAL HISTORY: Past Medical History:  Diagnosis Date   Anemia    Anxiety    Arthritis    Asthma    Blood transfusion without reported diagnosis    Cataract    removed bilateral   colon ca 08/2022   COPD (chronic obstructive pulmonary disease) (HCC)    Depression    GERD (gastroesophageal reflux disease)    Hyperlipidemia    Hypertension     PAST SURGICAL HISTORY: Past Surgical History:  Procedure Laterality Date   APPENDECTOMY     cataracts Bilateral    COLOSTOMY N/A 08/22/2022   Procedure:  COLOSTOMY;  Surgeon: Diamantina Monks, MD;  Location: [facility] OR;  Service: General;  Laterality: N/A;   IR NEPHROSTOMY PLACEMENT RIGHT  08/31/2022   KNEE SURGERY     LAPAROTOMY N/A 08/22/2022   Procedure: EXPLORATORY LAPAROTOMY;  Surgeon: Diamantina Monks, MD;  Location: [facility] OR;  Service: General;  Laterality: N/A;   NASAL SINUS SURGERY     PARTIAL COLECTOMY N/A 08/22/2022   Procedure: PARTIAL COLECTOMY;  Surgeon: Diamantina Monks, MD;  Location: [facility] OR;  Service: General;  Laterality: N/A;    FAMILY HISTORY: Family History  Problem Relation Age of Onset   Colon cancer Neg Hx    Colon polyps Neg Hx    Crohn's disease Neg Hx    Esophageal cancer Neg Hx    Rectal cancer Neg Hx    Stomach cancer Neg Hx    Ulcerative colitis Neg Hx     SOCIAL HISTORY: Social History   Socioeconomic History   Marital status: Divorced    Spouse name: Not on file   Number of children: Not on file   Years of education: Not on file   Highest education level: Not on file  Occupational History   Not on file  Tobacco Use   Smoking status: Former    Types: Cigarettes   Smokeless tobacco: Never  Vaping Use   Vaping status: Never Used  Substance and Sexual Activity   Alcohol use: No   Drug use: No   Sexual activity: Not on file  Other Topics Concern   Not on file  Social History Narrative   Not on file   Social Determinants of Health   Financial Resource Strain: Low Risk  (09/18/2022)   Received from [facility]   Overall Financial Resource Strain (CARDIA)    Difficulty of Paying Living Expenses: Not hard at all  Food Insecurity: Patient Declined (09/18/2022)   Received from [facility]   Hunger Vital Sign    Worried About Running Out of Food in the Last Year: Patient declined    Ran Out of Food in the Last Year: Patient declined  Transportation Needs: No Transportation Needs (09/17/2022)   Received from [facility]   [facility] SDOH Transportation Source    Has lack of transportation kept you from  medical appointments or from getting medications?: No    Has lack of transportation kept you from meetings, work, or from getting things needed for daily  living?: No  Physical Activity: Not on file  Stress: No Stress Concern Present (09/17/2022)   Received from [facility]   [facility] of Occupational Health - Occupational Stress Questionnaire    Feeling of Stress : Not at all  Social Connections: Patient Declined (09/18/2022)   Received from [facility]   Social Connection and Isolation Panel [NHANES]    Frequency of Communication with Friends and Family: Patient declined    Frequency of Social Gatherings with Friends and Family: Patient declined    Attends Religious Services: Patient declined    Database administrator or Organizations: Patient declined    Attends Banker Meetings: Patient declined    Marital Status: Patient declined  Intimate Partner Violence: Not At Risk (09/17/2022)   Received from [facility]   Domestic Abuse Assessment    Do you feel safe in your relationships at home?: Yes    Physical Abuse: Denies    HRSN Domestic Abuse - Type of Abuse: Not on file    HRSN Domestic Abuse - Time Frame: Not on file    HRSN Domestic Abuse - Signs and Symptoms: Not on file    Verbal Abuse: Denies    HRSN Domestic Abuse - Reported To: Not on file      PHYSICAL EXAM  There were no vitals filed for this visit. There is no height or weight on file to calculate BMI.  Generalized: Well developed, in no acute distress   Neurological examination  Mentation: Alert oriented to time, place, history taking. Follows all commands speech and language fluent Cranial nerve II-XII: Pupils were equal round reactive to light. Extraocular movements were full, visual field were full on confrontational test. Facial sensation and strength were normal. Uvula tongue midline. Head turning and shoulder shrug  were normal and symmetric. Motor: The motor testing reveals 5 over 5  strength of all 4 extremities. Good symmetric motor tone is noted throughout.  Sensory: Sensory testing is intact to soft touch on all 4 extremities. No evidence of extinction is noted.  Coordination: Cerebellar testing reveals good finger-nose-finger and heel-to-shin bilaterally.  Gait and station: Gait is normal. Tandem gait is normal. Romberg is negative. No drift is seen.  Reflexes: Deep tendon reflexes are symmetric and normal bilaterally.   DIAGNOSTIC DATA (LABS, IMAGING, TESTING) - I reviewed patient records, labs, notes, testing and imaging myself where available.  Lab Results  Component Value Date   WBC 12.0 (H) 10/09/2022   HGB 8.2 (L) 10/09/2022   HCT 27.3 (L) 10/09/2022   MCV 83.5 10/09/2022   PLT 678 (H) 10/09/2022      Component Value Date/Time   NA 137 10/09/2022 1505   K 3.5 10/09/2022 1505   CL 109 10/09/2022 1505   CO2 20 (L) 10/09/2022 1505   GLUCOSE 96 10/09/2022 1505   BUN 8 10/09/2022 1505   CREATININE 0.73 10/09/2022 1505   CALCIUM 7.8 (L) 10/09/2022 1505   PROT 6.0 (L) 10/09/2022 1505   ALBUMIN 2.3 (L) 10/09/2022 1505   AST 15 10/09/2022 1505   ALT 10 10/09/2022 1505   ALKPHOS 101 10/09/2022 1505   BILITOT 0.4 10/09/2022 1505   GFRNONAA >60 10/09/2022 1505   GFRAA >60 05/11/2015 0231   Lab Results  Component Value Date   CHOL 97 08/26/2022   HDL 15 (L) 08/26/2022   LDLCALC 58 08/26/2022   TRIG 122 08/26/2022   CHOLHDL 6.5 08/26/2022   Lab Results  Component Value Date   HGBA1C 5.4  08/27/2022   Lab Results  Component Value Date   VITAMINB12 455 09/04/2022   Lab Results  Component Value Date   TSH 1.446 08/18/2022      ASSESSMENT AND PLAN 71 y.o. year old female  has a past medical history of Anemia, Anxiety, Arthritis, Asthma, Blood transfusion without reported diagnosis, Cataract, colon ca (08/2022), COPD (chronic obstructive pulmonary disease) (HCC), Depression, GERD (gastroesophageal reflux disease), Hyperlipidemia, and  Hypertension. here with:    *** : Residual deficit: ***. Continue {anticoagulants:31417}  and ***  for secondary stroke prevention.  Discussed secondary stroke prevention measures and importance of close PCP follow up for aggressive stroke risk factor management. I have gone over the pathophysiology of stroke, warning signs and symptoms, risk factors and their management in some detail with instructions to go to the closest emergency room for symptoms of concern. HTN: BP goal <130/90.  Stable on *** per PCP HLD: LDL goal <70. Recent LDL ***.  DMII: A1c goal<7.0. Recent A1c ***.  Encouraged patient to monitor diet and encouraged exercise FU with our office ***      Butch Penny, MSN, NP-C 10/11/2022, 3:27 PM [facility] [address] [place], [place] [address] [phone]

## 2022-10-13 NOTE — Progress Notes (Signed)
I spoke with Debra Sharp's son Debra Sharp.  Debra Sharp is now under care of [facility].  All questions were answered.  He verbalized understanding.

## 2022-10-14 ENCOUNTER — Inpatient Hospital Stay: Payer: Medicare Other | Admitting: Adult Health

## 2022-10-14 ENCOUNTER — Encounter: Payer: Self-pay | Admitting: Adult Health

## 2023-01-17 DEATH — deceased

## 2023-05-19 ENCOUNTER — Telehealth: Payer: Self-pay

## 2023-05-19 NOTE — Telephone Encounter (Signed)
 Called patient but phone number is no longer in service.  Letter mailed to patient to please call the office to discuss.  Dr. Adela Lank - this patient cancelled her ECL (for IDA) last year which was scheduled at [facility] due to her cardiac issues. We have called her multiple times (at least on 05-26-22, 07-30-22 and 08-10-22) and left messages offering to reschedule her procedure but she has never called back.  I called her once more today, but her phone number is no longer in service.  Ok to remove her from your hospital wait list? (Letter was mailed to her today to please call us to discuss).

## 2023-05-19 NOTE — Telephone Encounter (Signed)
 Error- see other T/E

## 2023-05-19 NOTE — Telephone Encounter (Signed)
 Thanks for your diligence with this Jan. She did not want to do the procedures last I saw her when she was hospitalized but we had discussed following up as an outpatient. You have called multiple times and mailed a letter, I am assuming she does not want to proceed at this point. Up to her at this point if she wishes to follow up with [place] she can contact us, you can take her off the wait list. Thank you!
